# Patient Record
Sex: Female | Born: 1950 | Race: White | Hispanic: Refuse to answer | Marital: Married | Smoking: Never smoker
Health system: Southern US, Community
[De-identification: ages and names within clinical notes are randomized; demographics above are authoritative.]

## PROBLEM LIST (undated history)

## (undated) DIAGNOSIS — Z800 Family history of malignant neoplasm of digestive organs: Secondary | ICD-10-CM

## (undated) DIAGNOSIS — M1990 Unspecified osteoarthritis, unspecified site: Secondary | ICD-10-CM

## (undated) DIAGNOSIS — Z803 Family history of malignant neoplasm of breast: Secondary | ICD-10-CM

## (undated) DIAGNOSIS — IMO0002 Reserved for concepts with insufficient information to code with codable children: Secondary | ICD-10-CM

## (undated) DIAGNOSIS — I10 Essential (primary) hypertension: Secondary | ICD-10-CM

## (undated) DIAGNOSIS — Z801 Family history of malignant neoplasm of trachea, bronchus and lung: Secondary | ICD-10-CM

## (undated) DIAGNOSIS — K219 Gastro-esophageal reflux disease without esophagitis: Secondary | ICD-10-CM

## (undated) DIAGNOSIS — T7840XA Allergy, unspecified, initial encounter: Secondary | ICD-10-CM

## (undated) DIAGNOSIS — K5730 Diverticulosis of large intestine without perforation or abscess without bleeding: Secondary | ICD-10-CM

## (undated) DIAGNOSIS — J309 Allergic rhinitis, unspecified: Secondary | ICD-10-CM

## (undated) DIAGNOSIS — F419 Anxiety disorder, unspecified: Secondary | ICD-10-CM

## (undated) DIAGNOSIS — H269 Unspecified cataract: Secondary | ICD-10-CM

## (undated) DIAGNOSIS — Z8719 Personal history of other diseases of the digestive system: Secondary | ICD-10-CM

## (undated) DIAGNOSIS — J449 Chronic obstructive pulmonary disease, unspecified: Secondary | ICD-10-CM

## (undated) HISTORY — DX: Unspecified cataract: H26.9

## (undated) HISTORY — DX: Anxiety disorder, unspecified: F41.9

## (undated) HISTORY — DX: Family history of malignant neoplasm of breast: Z80.3

## (undated) HISTORY — DX: Gastro-esophageal reflux disease without esophagitis: K21.9

## (undated) HISTORY — DX: Reserved for concepts with insufficient information to code with codable children: IMO0002

## (undated) HISTORY — DX: Allergic rhinitis, unspecified: J30.9

## (undated) HISTORY — DX: Family history of malignant neoplasm of trachea, bronchus and lung: Z80.1

## (undated) HISTORY — DX: Diverticulosis of large intestine without perforation or abscess without bleeding: K57.30

## (undated) HISTORY — DX: Essential (primary) hypertension: I10

## (undated) HISTORY — DX: Allergy, unspecified, initial encounter: T78.40XA

## (undated) HISTORY — DX: Chronic obstructive pulmonary disease, unspecified: J44.9

## (undated) HISTORY — DX: Unspecified osteoarthritis, unspecified site: M19.90

## (undated) HISTORY — DX: Family history of malignant neoplasm of digestive organs: Z80.0

---

## 1982-11-17 HISTORY — PX: TUBAL LIGATION: SHX77

## 1988-11-17 HISTORY — PX: BREAST CYST ASPIRATION: SHX578

## 1989-11-17 HISTORY — PX: CHOLECYSTECTOMY: SHX55

## 1998-02-15 HISTORY — PX: LIPOMA EXCISION: SHX5283

## 2004-12-19 ENCOUNTER — Ambulatory Visit: Payer: Self-pay | Admitting: Family Medicine

## 2005-06-12 DIAGNOSIS — I10 Essential (primary) hypertension: Secondary | ICD-10-CM

## 2005-06-12 HISTORY — DX: Essential (primary) hypertension: I10

## 2006-01-26 ENCOUNTER — Ambulatory Visit: Payer: Self-pay | Admitting: Family Medicine

## 2006-02-04 ENCOUNTER — Ambulatory Visit: Payer: Self-pay | Admitting: Family Medicine

## 2007-02-09 ENCOUNTER — Ambulatory Visit: Payer: Self-pay | Admitting: Family Medicine

## 2008-02-29 ENCOUNTER — Ambulatory Visit: Payer: Self-pay | Admitting: Family Medicine

## 2009-03-13 ENCOUNTER — Ambulatory Visit: Payer: Self-pay | Admitting: Family Medicine

## 2010-03-21 ENCOUNTER — Ambulatory Visit: Payer: Self-pay | Admitting: Internal Medicine

## 2011-04-07 ENCOUNTER — Ambulatory Visit: Payer: Self-pay | Admitting: Internal Medicine

## 2011-04-11 LAB — HM PAP SMEAR: HM Pap smear: NEGATIVE

## 2011-05-18 HISTORY — PX: NASAL SINUS SURGERY: SHX719

## 2011-05-18 HISTORY — PX: CARDIAC CATHETERIZATION: SHX172

## 2011-05-27 ENCOUNTER — Inpatient Hospital Stay: Payer: Self-pay | Admitting: Internal Medicine

## 2011-06-02 ENCOUNTER — Ambulatory Visit: Payer: Self-pay | Admitting: Unknown Physician Specialty

## 2011-06-06 ENCOUNTER — Ambulatory Visit: Payer: Self-pay | Admitting: Unknown Physician Specialty

## 2011-07-19 ENCOUNTER — Emergency Department: Payer: Self-pay | Admitting: Emergency Medicine

## 2011-07-23 ENCOUNTER — Telehealth: Payer: Self-pay | Admitting: Internal Medicine

## 2011-07-23 NOTE — Telephone Encounter (Signed)
Noted I made Jessica aware.

## 2011-07-24 ENCOUNTER — Ambulatory Visit (INDEPENDENT_AMBULATORY_CARE_PROVIDER_SITE_OTHER): Payer: BC Managed Care – PPO | Admitting: Internal Medicine

## 2011-07-24 ENCOUNTER — Encounter: Payer: Self-pay | Admitting: Internal Medicine

## 2011-07-24 VITALS — BP 160/90 | HR 90 | Temp 98.3°F | Ht 66.0 in | Wt 157.2 lb

## 2011-07-24 DIAGNOSIS — R059 Cough, unspecified: Secondary | ICD-10-CM

## 2011-07-24 DIAGNOSIS — R05 Cough: Secondary | ICD-10-CM

## 2011-07-24 MED ORDER — TRAMADOL HCL 50 MG PO TABS: ORAL_TABLET | ORAL | Status: AC

## 2011-07-24 NOTE — Progress Notes (Signed)
  Subjective:    Patient ID: Chloe Moyer, female    DOB: 1951-02-21, 60 y.o.   MRN: [id number]  HPI  60 yowf never smoker though grew up in house full of smoker  with h/o asthma/allergies since 1999 eval by ent /allergist dust grass molds and did well on no maint medications then Jan 2012 with onset of cough eventually had sinus surgery July 2012 > some better breathing but still coughing so referred to pulmonary clinic by Dr Marcello Fennel.   07/24/2011 Initial pulmonary office eval in EMR  Era cc constant clearing of throat. Ok at night s excess mucus supine sleeping or early in am.  Sense of tightness in throat all waking hours assoc with hoarsness.  Nasal congestion much better since surgery July 2012 but cough urge is not.  ROS:  At present neg for  any significant sore throat, dysphagia, itching, sneezing, f fever, chills, sweats, unintended wt loss, pleuritic or exertional cp, hempoptysis, orthopnea pnd or leg swelling.    Also denies any obvious fluctuation of symptoms with weather or environmental changes or other aggravating or alleviating factors.          Review of Systems  Constitutional: Negative for fever, chills and unexpected weight change.  HENT: Positive for congestion, rhinorrhea, voice change and postnasal drip. Negative for ear pain, nosebleeds, sore throat, sneezing, trouble swallowing, dental problem and sinus pressure.   Eyes: Negative for visual disturbance.  Respiratory: Positive for cough. Negative for choking and shortness of breath.   Cardiovascular: Negative for chest pain and leg swelling.  Gastrointestinal: Negative for vomiting, abdominal pain and diarrhea.  Genitourinary: Negative for difficulty urinating.  Musculoskeletal: Negative for arthralgias.  Skin: Positive for rash.  Neurological: Negative for tremors, syncope and headaches.  Hematological: Does not bruise/bleed easily.       Objective:   Physical Exam  amb hoarse wf nad  Wt 157   07/24/11  HEENT: nl dentition, turbinates, and orophanx. Nl external ear canals without cough reflex   NECK :  without JVD/Nodes/TM/ nl carotid upstrokes bilaterally   LUNGS: no acc muscle use, clear to A and P bilaterally without cough on insp or exp maneuvers   CV:  RRR  no s3 or murmur or increase in P2, no edema   ABD:  soft and nontender with nl excursion in the supine position. No bruits or organomegaly, bowel sounds nl  MS:  warm without deformities, calf tenderness, cyanosis or clubbing  SKIN: warm and dry without lesions    NEURO:  alert, approp, no deficits        Assessment & Plan:

## 2011-07-24 NOTE — Patient Instructions (Signed)
Take delsym two tsp every 12 hours and supplement if needed with  tramadol 50 mg up to 2 every 4 hours to suppress the urge to cough. Swallowing water or using ice chips/non mint and menthol containing candies (such as lifesavers or sugarless jolly ranchers) are also effective.  You should rest your voice and avoid activities that you know make you cough.  Once you have eliminated the cough for 3 straight days try reducing the tramadol first,  then the delsym as tolerated.    Try prilosec 20mg   Take 30-60 min before first meal of the day and Zantac 300mg  one daily one bedtime until cough is completely gone for at least a week without the need for cough suppression  I think of reflux for chronic cough like I do oxygen for fire (doesn't cause the fire but once you get the oxygen suppressed it usually goes away regardless of the exact cause).   Stop advair and spiriva and just use the inhaler ventolin as needed  GERD (REFLUX)  is an extremely common cause of respiratory symptoms, many times with no significant heartburn at all.    It can be treated with medication, but also with lifestyle changes including avoidance of late meals, excessive alcohol, smoking cessation, and avoid fatty foods, chocolate, peppermint, colas, red wine, and acidic juices such as orange juice.  NO MINT OR MENTHOL PRODUCTS SO NO COUGH DROPS  USE SUGARLESS CANDY INSTEAD (jolley ranchers or Stover's)  NO OIL BASED VITAMINS - use powdered substitutes.   If you are satisfied with your treatment plan let your doctor know and he/she can either refill your medications or you can return here when your prescription runs out.     If in any way you are not 100% satisfied,  please tell us.  If 100% better, tell your friends!

## 2011-07-25 ENCOUNTER — Encounter: Payer: Self-pay | Admitting: Internal Medicine

## 2011-07-25 NOTE — Assessment & Plan Note (Addendum)
The most common causes of chronic cough in immunocompetent adults include the following: upper airway cough syndrome (UACS), previously referred to as postnasal drip syndrome (PNDS), which is caused by variety of rhinosinus conditions; (2) asthma; (3) GERD; (4) chronic bronchitis from cigarette smoking or other inhaled environmental irritants; (5) nonasthmatic eosinophilic bronchitis; and (6) bronchiectasis.   These conditions, singly or in combination, have accounted for up to 94% of the causes of chronic cough in prospective studies.   Other conditions have constituted no >6% of the causes in prospective studies These have included bronchogenic carcinoma, chronic interstitial pneumonia, sarcoidosis, left ventricular failure, ACEI-induced cough, and aspiration from a condition associated with pharyngeal dysfunction.  This is most likely  Classic Upper airway cough syndrome, so named because it's frequently impossible to sort out how much is  CR/sinusitis with freq throat clearing (which can be related to primary GERD)   vs  causing  secondary (" extra esophageal")  GERD from wide swings in gastric pressure that occur with throat clearing, often  promoting self use of mint and menthol lozenges that reduce the lower esophageal sphincter tone and exacerbate the problem further in a cyclical fashion.   These are the same pts who not infrequently have failed to tolerate ace inhibitors,  dry powder inhalers (she is on two she doesn't think are helping, spiriva and advair) or biphosphonates or report having reflux symptoms that don't respond to standard doses of PPI , and are easily confused as having aecopd or asthma flares,   See instructions for specific recommendations which were reviewed directly with the patient who was given a copy with highlighter outlining the key components.

## 2012-04-12 ENCOUNTER — Ambulatory Visit: Payer: Self-pay | Admitting: Internal Medicine

## 2012-07-02 LAB — CBC AND DIFFERENTIAL
HCT: 39 % (ref 36–46)
WBC: 6.6 10^3/mL

## 2012-07-13 LAB — HM PAP SMEAR

## 2012-09-24 ENCOUNTER — Encounter: Payer: Self-pay | Admitting: *Deleted

## 2012-09-27 ENCOUNTER — Encounter: Payer: Self-pay | Admitting: Internal Medicine

## 2012-09-27 ENCOUNTER — Ambulatory Visit (INDEPENDENT_AMBULATORY_CARE_PROVIDER_SITE_OTHER): Payer: BC Managed Care – PPO | Admitting: Internal Medicine

## 2012-09-27 VITALS — BP 150/90 | HR 91 | Temp 97.6°F | Ht 65.5 in | Wt 175.5 lb

## 2012-09-27 DIAGNOSIS — K219 Gastro-esophageal reflux disease without esophagitis: Secondary | ICD-10-CM

## 2012-09-27 DIAGNOSIS — J309 Allergic rhinitis, unspecified: Secondary | ICD-10-CM

## 2012-09-27 DIAGNOSIS — Z23 Encounter for immunization: Secondary | ICD-10-CM

## 2012-09-27 DIAGNOSIS — J45909 Unspecified asthma, uncomplicated: Secondary | ICD-10-CM

## 2012-09-27 DIAGNOSIS — R030 Elevated blood-pressure reading, without diagnosis of hypertension: Secondary | ICD-10-CM

## 2012-09-27 DIAGNOSIS — Z139 Encounter for screening, unspecified: Secondary | ICD-10-CM

## 2012-09-27 DIAGNOSIS — I10 Essential (primary) hypertension: Secondary | ICD-10-CM

## 2012-09-27 DIAGNOSIS — IMO0001 Reserved for inherently not codable concepts without codable children: Secondary | ICD-10-CM

## 2012-09-27 NOTE — Patient Instructions (Addendum)
It was nice meeting you today.  I am glad you are doing better.  I want you to monitor your blood pressure and let me know if persistent elevation.  We will schedule a follow up appt soon - to reassess.  Let me know if any problems.

## 2012-10-10 ENCOUNTER — Encounter: Payer: Self-pay | Admitting: Internal Medicine

## 2012-10-10 DIAGNOSIS — J309 Allergic rhinitis, unspecified: Secondary | ICD-10-CM | POA: Insufficient documentation

## 2012-10-10 DIAGNOSIS — I10 Essential (primary) hypertension: Secondary | ICD-10-CM | POA: Insufficient documentation

## 2012-10-10 DIAGNOSIS — J45909 Unspecified asthma, uncomplicated: Secondary | ICD-10-CM | POA: Insufficient documentation

## 2012-10-10 DIAGNOSIS — K219 Gastro-esophageal reflux disease without esophagitis: Secondary | ICD-10-CM | POA: Insufficient documentation

## 2012-10-10 NOTE — Assessment & Plan Note (Signed)
Sees Dr Graylon Good and Dr Genelle Bal Senior for her asthmal, allergies and sinus care.  Is s/p sinus surgery x 2.  Currently relatively stable.  Some increased drainage.  Continue her current regimen.

## 2012-10-10 NOTE — Progress Notes (Signed)
Subjective:    Patient ID: Chloe Moyer, female    DOB: 06-10-1951, 61 y.o.   MRN: [id number]  HPI 61 year old female with past history of asthma, sinus problems, allergies and hypertension who comes in today to follow up on these issues as well as to establish care.  She states she is doing better now.  She had started on Lisinopril for her blood pressure.  This caused a persistent cough.  Subsequently had increasing problems with her sinuses and allergies.  Is s/p sinus surgery x 2.  Doing better now.  Some drainage.  On prilosec for acid reflux.  Uses her inhalers.  Breathing stable.  No nausea or vomiting.  Due for colonoscopy this year.  Has some problems with hemorrhoids.   Past Medical History  Diagnosis Date  . Asthma   . GERD (gastroesophageal reflux disease)   . Allergic rhinitis   . Diverticulosis, sigmoid   . Hypertension 06-12-2005  . Arthritis   . Ulcer     Outpatient Encounter Prescriptions as of 09/27/2012  Medication Sig Dispense Refill  . albuterol (PROVENTIL HFA;VENTOLIN HFA) 108 (90 BASE) MCG/ACT inhaler Inhale 2 puffs using inhaler every 4-6 hours as needed for SOB/wheeze.      . budesonide (PULMICORT) 0.5 MG/2ML nebulizer solution Take 0.5 mg by nebulization 2 (two) times daily.      . budesonide-formoterol (SYMBICORT) 160-4.5 MCG/ACT inhaler Inhale 2 puffs into the lungs daily. as directed.      . hydrOXYzine (ATARAX/VISTARIL) 25 MG tablet Take one tablet by mouth once a day as needed.      Marland Kitchen ipratropium (ATROVENT) 0.03 % nasal spray Place 2 sprays into the nose 2 (two) times daily.      . montelukast (SINGULAIR) 10 MG tablet Take one tablet by mouth once a day      . omalizumab (XOLAIR) 150 MG injection Inject subcutaneously twice a week      . omeprazole (PRILOSEC) 20 MG capsule Take 40 mg by mouth daily.       . predniSONE (DELTASONE) 10 MG tablet Take 10 mg by mouth 2 (two) times daily.        Marland Kitchen azelastine (ASTELIN) 137 MCG/SPRAY nasal spray Place 1 spray  into the nose 2 (two) times daily. Use in each nostril as directed      . cyclobenzaprine (FLEXERIL) 10 MG tablet Take 1/2 to 1 tablet by mouth every 8 hours as needed      . diphenhydrAMINE (BENADRYL) 25 mg capsule Take 25 mg by mouth every 6 (six) hours as needed.        . etodolac (LODINE) 400 MG tablet Take 400 mg by mouth 2 (two) times daily. with meals.      . hydrocortisone (ANUSOL-HC) 25 MG suppository Insert one suppository into the rectum every night as needed      . Hypertonic Nasal Wash (SINUS RINSE NA) As directed three times daily       . Multiple Vitamins-Calcium (ONE-A-DAY WOMENS PO) Take 1 tablet by mouth daily.          Review of Systems Patient denies any headache, lightheadedness or dizziness. Some drainage but no significant sinus symptoms.   No chest pain, tightness or palpitations.  No increased shortness of breath, cough or congestion.  Acid reflux controlled.  No nausea or vomiting.  No abdominal pain or cramping.  No bowel change, such as diarrhea, constipation.  Some problems with hemorrhoids.  No urine change.  Objective:   Physical Exam GI.  Due a colonoscopy this year.  Refer to GI for evaluation/colonoscopy.  Annusol HC suppositories for hemorrhoids.    CARDIOVASCULAR.  Currently asymptomatic.  Had a heart catheterizaion recently.  Negative.    HEALTH MAINTENANCE.  Had her physical 8/13.  Due colonoscopy.  Refer to GI.  Mammogram 9/13 - ok.  Obtain recoreds.         Assessment & Plan:  60 year

## 2012-10-10 NOTE — Assessment & Plan Note (Addendum)
Blood pressure elevated.  Have her spot check her pressure.  Get her back in soon to reassess.  Will need medication if persistent elevation.  Check metabolic panel with next labs.  Had cough with lisinopril.  Was on 1/2 of Losartan.  Not taking now.

## 2012-10-10 NOTE — Assessment & Plan Note (Addendum)
Takes prilosec in the morning.  Follow.  Controlled.  H. Pylori negative.  Barium swallow negative.  CXR negative.

## 2012-10-10 NOTE — Assessment & Plan Note (Signed)
Followed by Dr Genelle Bal Senior and Dr Graylon Good for her sinus problems, allergies and asthma.  Breathing stable.  Continue her current med regimen.

## 2012-11-02 ENCOUNTER — Encounter: Payer: Self-pay | Admitting: *Deleted

## 2012-11-03 ENCOUNTER — Other Ambulatory Visit: Payer: Self-pay | Admitting: Internal Medicine

## 2012-11-03 ENCOUNTER — Ambulatory Visit (INDEPENDENT_AMBULATORY_CARE_PROVIDER_SITE_OTHER): Payer: BC Managed Care – PPO | Admitting: Internal Medicine

## 2012-11-03 ENCOUNTER — Encounter: Payer: Self-pay | Admitting: Internal Medicine

## 2012-11-03 VITALS — BP 140/90 | HR 78 | Temp 97.9°F | Ht 65.5 in | Wt 176.5 lb

## 2012-11-03 DIAGNOSIS — I10 Essential (primary) hypertension: Secondary | ICD-10-CM

## 2012-11-03 DIAGNOSIS — J45909 Unspecified asthma, uncomplicated: Secondary | ICD-10-CM

## 2012-11-03 DIAGNOSIS — K219 Gastro-esophageal reflux disease without esophagitis: Secondary | ICD-10-CM

## 2012-11-03 MED ORDER — LOSARTAN POTASSIUM 25 MG PO TABS: 25.0000 mg | ORAL_TABLET | Freq: Every day | ORAL | Status: DC

## 2012-11-03 NOTE — Telephone Encounter (Signed)
Will start pt on 25mg  of Losartan q day.  rx sent in to pharmacy.  #30 with 2 refills.  (she had some left over losartan from previous.  Instructed not to take this - will start with 25mg  dose. )

## 2012-11-03 NOTE — Telephone Encounter (Signed)
Pt wanted to let you know that her Losartin was 100 mg. She was wanting to know if she should cut it in half or how she should take it ???

## 2012-11-03 NOTE — Patient Instructions (Addendum)
It was nice seeing you again today.  I am going to start Losartan - one per day.  Let me know if any problems.  Monitor your blood pressure.

## 2012-11-12 ENCOUNTER — Encounter: Payer: Self-pay | Admitting: Internal Medicine

## 2012-11-12 NOTE — Assessment & Plan Note (Signed)
Blood pressure elevated.  Start Losartan 25mg  q day.  Follow pressures.  Check cr and potassium within the next two weeks.

## 2012-11-12 NOTE — Assessment & Plan Note (Signed)
Breathing stable on current regimen.  Follow.   

## 2012-11-12 NOTE — Assessment & Plan Note (Signed)
Controlled on Prilosec.  Follow.   

## 2012-11-12 NOTE — Progress Notes (Signed)
Subjective:    Patient ID: NEFERTITI MOHAMAD, female    DOB: 12-Oct-1951, 61 y.o.   MRN: [id number]  HPI 61 year old female with past history of asthma, sinus problems, allergies and hypertension who comes in today for a scheduled follow up.  Here to follow up regarding her blood pressure.  Blood pressure has been averaging 140-153/80-90s (on outside checks).  No cardiac symptoms with increased activity or exertion.  On prilosec for acid reflux.  Uses her inhalers.  Breathing stable.  No nausea or vomiting.    Past Medical History  Diagnosis Date  . Asthma   . GERD (gastroesophageal reflux disease)   . Allergic rhinitis   . Diverticulosis, sigmoid   . Hypertension 06-12-2005  . Arthritis   . Ulcer     Outpatient Encounter Prescriptions as of 11/03/2012  Medication Sig Dispense Refill  . albuterol (PROVENTIL HFA;VENTOLIN HFA) 108 (90 BASE) MCG/ACT inhaler Inhale 2 puffs using inhaler every 4-6 hours as needed for SOB/wheeze.      Marland Kitchen azelastine (ASTELIN) 137 MCG/SPRAY nasal spray Place 1 spray into the nose 2 (two) times daily. Use in each nostril as directed      . budesonide (PULMICORT) 0.5 MG/2ML nebulizer solution Take 0.5 mg by nebulization 2 (two) times daily.      . budesonide-formoterol (SYMBICORT) 160-4.5 MCG/ACT inhaler Inhale 2 puffs into the lungs daily. as directed.      . cyclobenzaprine (FLEXERIL) 10 MG tablet Take 1/2 to 1 tablet by mouth every 8 hours as needed      . diphenhydrAMINE (BENADRYL) 25 mg capsule Take 25 mg by mouth every 6 (six) hours as needed.        . etodolac (LODINE) 400 MG tablet Take 400 mg by mouth 2 (two) times daily. with meals.      . hydrocortisone (ANUSOL-HC) 25 MG suppository Insert one suppository into the rectum every night as needed      . hydrOXYzine (ATARAX/VISTARIL) 25 MG tablet Take one tablet by mouth once a day as needed.      . Hypertonic Nasal Wash (SINUS RINSE NA) As directed three times daily       . ipratropium (ATROVENT) 0.03 % nasal  spray Place 2 sprays into the nose 2 (two) times daily.      . montelukast (SINGULAIR) 10 MG tablet Take one tablet by mouth once a day      . Multiple Vitamins-Calcium (ONE-A-DAY WOMENS PO) Take 1 tablet by mouth daily.        Marland Kitchen omalizumab (XOLAIR) 150 MG injection Inject subcutaneously twice a week      . omeprazole (PRILOSEC) 20 MG capsule Take 40 mg by mouth daily.       . predniSONE (DELTASONE) 10 MG tablet Take 10 mg by mouth 2 (two) times daily.          Review of Systems Patient denies any headache, lightheadedness or dizziness.  No chest pain, tightness or palpitations.  No increased shortness of breath, cough or congestion.  Acid reflux controlled.  No nausea or vomiting.  No abdominal pain or cramping.  No bowel change, such as diarrhea, constipation.   No urine change.        Objective:   Physical Exam  Filed Vitals:   11/03/12 0943  BP: 140/90  Pulse: 78  Temp: 97.9 F (77.38 C)   61 year old female in no acute distress.   HEENT:  Nares - clear.  OP- without lesions or  erythema.  NECK:  Supple, nontender.  No audible bruit.   HEART:  Appears to be regular. LUNGS:  Without crackles or wheezing audible.  Respirations even and unlabored.   RADIAL PULSE:  Equal bilaterally.  ABDOMEN:  Soft, nontender.  No audible abdominal bruit.   EXTREMITIES:  No increased edema to be present.                  Assessment & Plan:  GI.  Due a colonoscopy this year.  Referred to GI for evaluation/colonoscopy.  Was given Annusol HC suppositories for hemorrhoids.  CARDIOVASCULAR.  Currently asymptomatic.  Had a heart catheterization recently negative.    HEALTH MAINTENANCE.  Had her physical 8/13.  Due colonoscopy.  Refer to GI.  Mammogram 9/13 - ok.  Obtain recoreds.

## 2012-11-19 ENCOUNTER — Other Ambulatory Visit (INDEPENDENT_AMBULATORY_CARE_PROVIDER_SITE_OTHER): Payer: BC Managed Care – PPO

## 2012-11-19 DIAGNOSIS — I10 Essential (primary) hypertension: Secondary | ICD-10-CM

## 2012-11-19 LAB — CREATININE, SERUM: Creatinine, Ser: 0.7 mg/dL (ref 0.4–1.2)

## 2012-11-19 LAB — POTASSIUM: Potassium: 3.7 mEq/L (ref 3.5–5.1)

## 2012-12-21 ENCOUNTER — Ambulatory Visit: Payer: BC Managed Care – PPO | Admitting: Internal Medicine

## 2012-12-29 ENCOUNTER — Encounter: Payer: Self-pay | Admitting: Internal Medicine

## 2012-12-29 ENCOUNTER — Ambulatory Visit (INDEPENDENT_AMBULATORY_CARE_PROVIDER_SITE_OTHER): Payer: BC Managed Care – PPO | Admitting: Internal Medicine

## 2012-12-29 ENCOUNTER — Telehealth: Payer: Self-pay | Admitting: Internal Medicine

## 2012-12-29 VITALS — BP 126/88 | HR 85 | Temp 97.7°F | Wt 178.0 lb

## 2012-12-29 DIAGNOSIS — Z1239 Encounter for other screening for malignant neoplasm of breast: Secondary | ICD-10-CM

## 2012-12-29 DIAGNOSIS — J45909 Unspecified asthma, uncomplicated: Secondary | ICD-10-CM

## 2012-12-29 DIAGNOSIS — I10 Essential (primary) hypertension: Secondary | ICD-10-CM

## 2012-12-29 DIAGNOSIS — J309 Allergic rhinitis, unspecified: Secondary | ICD-10-CM

## 2012-12-29 DIAGNOSIS — K219 Gastro-esophageal reflux disease without esophagitis: Secondary | ICD-10-CM

## 2012-12-29 NOTE — Telephone Encounter (Signed)
°  Refill on  losartan (COZAAR) 25 MG tablet   Send to [facility]

## 2012-12-30 ENCOUNTER — Encounter: Payer: Self-pay | Admitting: Internal Medicine

## 2012-12-30 NOTE — Assessment & Plan Note (Signed)
Breathing stable on symbicort.  Follow.

## 2012-12-30 NOTE — Assessment & Plan Note (Signed)
On losartan 25mg q day.  Blood pressure doing well.  Recent Cr and potassium wnl.  Follow.   

## 2012-12-30 NOTE — Progress Notes (Signed)
Subjective:    Patient ID: Chloe Moyer, female    DOB: [birth year], 62 y.o.   MRN: [id number]  HPI 62 year old female with past history of asthma, sinus problems, allergies and hypertension who comes in today for a scheduled follow up.  Here to follow up regarding her blood pressure.  Blood pressure has been averaging 110-130/60-70s. (on outside checks).  No cardiac symptoms with increased activity or exertion.  On prilosec for acid reflux.  Uses her inhalers.  Breathing stable.  No nausea or vomiting.    Past Medical History  Diagnosis Date  . Asthma   . GERD (gastroesophageal reflux disease)   . Allergic rhinitis   . Diverticulosis, sigmoid   . Hypertension 06-12-2005  . Arthritis   . Ulcer     Outpatient Encounter Prescriptions as of 12/29/2012  Medication Sig Dispense Refill  . albuterol (PROVENTIL HFA;VENTOLIN HFA) 108 (90 BASE) MCG/ACT inhaler Inhale 2 puffs using inhaler every 4-6 hours as needed for SOB/wheeze.      Marland Kitchen azelastine (ASTELIN) 137 MCG/SPRAY nasal spray Place 1 spray into the nose 2 (two) times daily. Use in each nostril as directed      . budesonide (PULMICORT) 0.5 MG/2ML nebulizer solution Take 0.5 mg by nebulization 2 (two) times daily.      . budesonide-formoterol (SYMBICORT) 160-4.5 MCG/ACT inhaler Inhale 2 puffs into the lungs daily. as directed.      . diphenhydrAMINE (BENADRYL) 25 mg capsule Take 25 mg by mouth every 6 (six) hours as needed.        . hydrocortisone (ANUSOL-HC) 25 MG suppository Insert one suppository into the rectum every night as needed      . Hypertonic Nasal Wash (SINUS RINSE NA) As directed three times daily       . losartan (COZAAR) 25 MG tablet Take 1 tablet (25 mg total) by mouth daily.  30 tablet  2  . omalizumab (XOLAIR) 150 MG injection Inject subcutaneously twice a week      . omeprazole (PRILOSEC) 20 MG capsule Take 40 mg by mouth daily.       . predniSONE (DELTASONE) 10 MG tablet Take 10 mg by mouth 2 (two) times daily.         . cyclobenzaprine (FLEXERIL) 10 MG tablet Take 1/2 to 1 tablet by mouth every 8 hours as needed      . etodolac (LODINE) 400 MG tablet Take 400 mg by mouth 2 (two) times daily. with meals.      . hydrOXYzine (ATARAX/VISTARIL) 25 MG tablet Take one tablet by mouth once a day as needed.      Marland Kitchen ipratropium (ATROVENT) 0.03 % nasal spray Place 2 sprays into the nose 2 (two) times daily.      . montelukast (SINGULAIR) 10 MG tablet Take one tablet by mouth once a day      . Multiple Vitamins-Calcium (ONE-A-DAY WOMENS PO) Take 1 tablet by mouth daily.         No facility-administered encounter medications on file as of 12/29/2012.    Review of Systems Patient denies any headache, lightheadedness or dizziness.  No chest pain, tightness or palpitations.  No increased shortness of breath, cough or congestion.  Acid reflux controlled.  No nausea or vomiting.  No abdominal pain or cramping.  No bowel change, such as diarrhea, constipation.   No urine change.  Has follow up for her sinuses - 01/06/13.  Will see Dr Gillermina Hu.  States she is using her saline  rinse for her nose.  Some right ear fullness previously.  She states feels better today.        Objective:   Physical Exam  Filed Vitals:   12/29/12 0957  BP: 126/88  Pulse: 85  Temp: 97.7 F (36.5 C)   Blood pressure recheck:  40/36  62 year old female in no acute distress.   HEENT:  Nares - clear.  OP- without lesions or erythema.  TMs without erythema.   NECK:  Supple, nontender.  No audible bruit.   HEART:  Appears to be regular. LUNGS:  Without crackles or wheezing audible.  Respirations even and unlabored.   RADIAL PULSE:  Equal bilaterally.  ABDOMEN:  Soft, nontender.  No audible abdominal bruit.   EXTREMITIES:  No increased edema to be present.                  Assessment & Plan:  GI.  Due a colonoscopy this year.  Was referred to GI for evaluation/colonoscopy.   CARDIOVASCULAR.  Currently asymptomatic.  Had a heart catheterization  recently negative.    HEALTH MAINTENANCE.  Had her physical 8/13.  Due colonoscopy.  Refer to GI.  Mammogram 04/12/12 - Birads II.  Pap 04/11/11 - negative.

## 2012-12-30 NOTE — Assessment & Plan Note (Signed)
Controlled.  Follow.   

## 2012-12-30 NOTE — Assessment & Plan Note (Signed)
Stable on current regimen   

## 2013-01-01 ENCOUNTER — Other Ambulatory Visit: Payer: Self-pay

## 2013-01-03 MED ORDER — LOSARTAN POTASSIUM 25 MG PO TABS: 25.0000 mg | ORAL_TABLET | Freq: Every day | ORAL | Status: DC

## 2013-01-03 NOTE — Telephone Encounter (Signed)
Sent in to pharmacy.  

## 2013-04-13 ENCOUNTER — Ambulatory Visit: Payer: Self-pay | Admitting: Internal Medicine

## 2013-04-28 ENCOUNTER — Encounter: Payer: Self-pay | Admitting: Internal Medicine

## 2013-04-28 ENCOUNTER — Ambulatory Visit (INDEPENDENT_AMBULATORY_CARE_PROVIDER_SITE_OTHER): Payer: BC Managed Care – PPO | Admitting: Internal Medicine

## 2013-04-28 VITALS — BP 120/80 | HR 78 | Temp 98.3°F | Ht 65.5 in | Wt 174.8 lb

## 2013-04-28 DIAGNOSIS — K219 Gastro-esophageal reflux disease without esophagitis: Secondary | ICD-10-CM

## 2013-04-28 DIAGNOSIS — J45909 Unspecified asthma, uncomplicated: Secondary | ICD-10-CM

## 2013-04-28 DIAGNOSIS — J309 Allergic rhinitis, unspecified: Secondary | ICD-10-CM

## 2013-04-28 DIAGNOSIS — Z13220 Encounter for screening for lipoid disorders: Secondary | ICD-10-CM

## 2013-04-28 DIAGNOSIS — Z23 Encounter for immunization: Secondary | ICD-10-CM

## 2013-04-28 DIAGNOSIS — I10 Essential (primary) hypertension: Secondary | ICD-10-CM

## 2013-04-28 NOTE — Progress Notes (Signed)
Subjective:    Patient ID: Chloe Moyer, female    DOB: Dec 17, 1950, 62 y.o.   MRN: [id number]  HPI 62 year old female with past history of asthma, sinus problems, allergies and hypertension who comes in today for a scheduled follow up. No cardiac symptoms with increased activity or exertion.  On prilosec for acid reflux.  Uses her inhalers.  Breathing stable. Some increased drainage.  No nausea or vomiting.  Just had a dental procedure.  Doing well.  Also had Botox injections yesterday.  States her blood pressure has been doing well.    Past Medical History  Diagnosis Date  . Asthma   . GERD (gastroesophageal reflux disease)   . Allergic rhinitis   . Diverticulosis, sigmoid   . Hypertension 06-12-2005  . Arthritis   . Ulcer     Outpatient Encounter Prescriptions as of 04/28/2013  Medication Sig Dispense Refill  . albuterol (PROVENTIL HFA;VENTOLIN HFA) 108 (90 BASE) MCG/ACT inhaler Inhale 2 puffs using inhaler every 4-6 hours as needed for SOB/wheeze.      Marland Kitchen azelastine (ASTELIN) 137 MCG/SPRAY nasal spray Place 1 spray into the nose 2 (two) times daily. Use in each nostril as directed      . budesonide (PULMICORT) 0.5 MG/2ML nebulizer solution Take 0.5 mg by nebulization 2 (two) times daily.      . budesonide-formoterol (SYMBICORT) 160-4.5 MCG/ACT inhaler Inhale 2 puffs into the lungs daily. as directed.      . diphenhydrAMINE (BENADRYL) 25 mg capsule Take 25 mg by mouth every 6 (six) hours as needed.        . hydrocortisone (ANUSOL-HC) 25 MG suppository Insert one suppository into the rectum every night as needed      . Hypertonic Nasal Wash (SINUS RINSE NA) As directed three times daily       . losartan (COZAAR) 25 MG tablet Take 1 tablet (25 mg total) by mouth daily.  90 tablet  1  . omalizumab (XOLAIR) 150 MG injection Inject subcutaneously twice a week      . omeprazole (PRILOSEC) 20 MG capsule Take 40 mg by mouth daily.       . cyclobenzaprine (FLEXERIL) 10 MG tablet Take 1/2 to  1 tablet by mouth every 8 hours as needed      . etodolac (LODINE) 400 MG tablet Take 400 mg by mouth 2 (two) times daily. with meals.      . hydrOXYzine (ATARAX/VISTARIL) 25 MG tablet Take one tablet by mouth once a day as needed.      Marland Kitchen ipratropium (ATROVENT) 0.03 % nasal spray Place 2 sprays into the nose 2 (two) times daily.      . montelukast (SINGULAIR) 10 MG tablet Take one tablet by mouth once a day      . Multiple Vitamins-Calcium (ONE-A-DAY WOMENS PO) Take 1 tablet by mouth daily.        . predniSONE (DELTASONE) 10 MG tablet Take 10 mg by mouth 2 (two) times daily.         No facility-administered encounter medications on file as of 04/28/2013.    Review of Systems Patient denies any headache, lightheadedness or dizziness.  Some drainage.  No chest pain, tightness or palpitations.  No increased shortness of breath, cough or congestion.  Acid reflux. On prilosec.  No nausea or vomiting.  No abdominal pain or cramping.  No bowel change, such as diarrhea, constipation.   No urine change.  States she is using her saline rinse for her  nose.        Objective:   Physical Exam  Filed Vitals:   04/28/13 0857  BP: 120/80  Pulse: 78  Temp: 98.3 F (36.8 C)   Blood pressure recheck:  128/82, pulse 91  62 year old female in no acute distress.   HEENT:  Nares - clear.  OP- without lesions or erythema.   NECK:  Supple, nontender.  No audible bruit.   HEART:  Appears to be regular. LUNGS:  Without crackles or wheezing audible.  Respirations even and unlabored.   RADIAL PULSE:  Equal bilaterally.  ABDOMEN:  Soft, nontender.  No audible abdominal bruit.   EXTREMITIES:  No increased edema to be present.                  Assessment & Plan:  GI.  Due a colonoscopy this year.  States she is due 12/14.    CARDIOVASCULAR.  Currently asymptomatic.  Had a heart catheterization recently -  negative.    HEALTH MAINTENANCE.  Had her physical 8/13.  Schedule her for her physical in 8/14. Due  colonoscopy 12/14.  Mammogram 04/12/12 - Birads II.  Had a f/u mammogram end of 5/14.  Obtain results.  Pap 04/11/11 - negative.   Tdap given today.

## 2013-05-08 ENCOUNTER — Encounter: Payer: Self-pay | Admitting: Internal Medicine

## 2013-05-08 NOTE — Assessment & Plan Note (Signed)
Controlled.  Follow.   

## 2013-05-08 NOTE — Assessment & Plan Note (Signed)
On losartan 25mg q day.  Blood pressure doing well.  Recent Cr and potassium wnl.  Follow.   

## 2013-05-08 NOTE — Assessment & Plan Note (Signed)
Stable on current regimen   

## 2013-05-08 NOTE — Assessment & Plan Note (Signed)
Breathing stable on symbicort.  Follow.

## 2013-06-01 ENCOUNTER — Encounter: Payer: Self-pay | Admitting: Internal Medicine

## 2013-06-22 ENCOUNTER — Telehealth: Payer: Self-pay | Admitting: Internal Medicine

## 2013-06-22 NOTE — Telephone Encounter (Signed)
error 

## 2013-07-01 ENCOUNTER — Other Ambulatory Visit (INDEPENDENT_AMBULATORY_CARE_PROVIDER_SITE_OTHER): Payer: BC Managed Care – PPO

## 2013-07-01 DIAGNOSIS — Z13220 Encounter for screening for lipoid disorders: Secondary | ICD-10-CM

## 2013-07-01 DIAGNOSIS — J309 Allergic rhinitis, unspecified: Secondary | ICD-10-CM

## 2013-07-01 DIAGNOSIS — I10 Essential (primary) hypertension: Secondary | ICD-10-CM

## 2013-07-01 LAB — COMPREHENSIVE METABOLIC PANEL
ALT: 25 U/L (ref 0–35)
Albumin: 4 g/dL (ref 3.5–5.2)
Alkaline Phosphatase: 55 U/L (ref 39–117)
CO2: 27 mEq/L (ref 19–32)
GFR: 86.75 mL/min (ref >60.00)
Glucose, Bld: 90 mg/dL (ref 70–99)
Potassium: 3.9 mEq/L (ref 3.5–5.1)
Sodium: 137 mEq/L (ref 135–145)
Total Protein: 7.4 g/dL (ref 6.0–8.3)

## 2013-07-01 LAB — CBC WITH DIFFERENTIAL/PLATELET
Basophils Absolute: 0 10*3/uL (ref 0.0–0.1)
Eosinophils Relative: 4.2 % (ref 0.0–5.0)
Monocytes Relative: 9.4 % (ref 3.0–12.0)
Neutrophils Relative %: 40.1 % — ABNORMAL LOW (ref 43.0–77.0)
Platelets: 179 10*3/uL (ref 150.0–400.0)
WBC: 4.1 10*3/uL — ABNORMAL LOW (ref 4.5–10.5)

## 2013-07-01 LAB — LIPID PANEL
Total CHOL/HDL Ratio: 4
VLDL: 15.2 mg/dL (ref 0.0–40.0)

## 2013-07-01 LAB — TSH: TSH: 3.37 u[IU]/mL (ref 0.35–5.50)

## 2013-07-02 ENCOUNTER — Encounter: Payer: Self-pay | Admitting: Internal Medicine

## 2013-07-05 ENCOUNTER — Encounter: Payer: Self-pay | Admitting: Internal Medicine

## 2013-07-05 ENCOUNTER — Ambulatory Visit (INDEPENDENT_AMBULATORY_CARE_PROVIDER_SITE_OTHER): Payer: BC Managed Care – PPO | Admitting: Internal Medicine

## 2013-07-05 VITALS — BP 120/80 | HR 80 | Temp 98.1°F | Ht 65.0 in | Wt 179.5 lb

## 2013-07-05 DIAGNOSIS — D72819 Decreased white blood cell count, unspecified: Secondary | ICD-10-CM

## 2013-07-05 DIAGNOSIS — K219 Gastro-esophageal reflux disease without esophagitis: Secondary | ICD-10-CM

## 2013-07-05 DIAGNOSIS — J45909 Unspecified asthma, uncomplicated: Secondary | ICD-10-CM

## 2013-07-05 DIAGNOSIS — J309 Allergic rhinitis, unspecified: Secondary | ICD-10-CM

## 2013-07-05 DIAGNOSIS — I10 Essential (primary) hypertension: Secondary | ICD-10-CM

## 2013-07-05 MED ORDER — LOSARTAN POTASSIUM 25 MG PO TABS: 25.0000 mg | ORAL_TABLET | Freq: Every day | ORAL | Status: DC

## 2013-07-05 NOTE — Progress Notes (Signed)
Subjective:    Patient ID: Chloe Moyer, female    DOB: 03-09-1951, 62 y.o.   MRN: [id number]  HPI 62 year old female with past history of asthma, sinus problems, allergies and hypertension who comes in today to follow up on these issues as well as for a complete physical exam.  No cardiac symptoms with increased activity or exertion.  On prilosec for acid reflux.  Uses her inhalers.  Breathing stable.  No significant sinus or allergy symptoms.  Due to follow up with Dr Senior this week.  No nausea or vomiting.  Doing well.  States her blood pressure has been doing well.  Has a follow up breathing test 11/14 (at [facility]).     Past Medical History  Diagnosis Date  . Asthma   . GERD (gastroesophageal reflux disease)   . Allergic rhinitis   . Diverticulosis, sigmoid   . Hypertension 06-12-2005  . Arthritis   . Ulcer     Outpatient Encounter Prescriptions as of 07/05/2013  Medication Sig Dispense Refill  . albuterol (PROVENTIL HFA;VENTOLIN HFA) 108 (90 BASE) MCG/ACT inhaler Inhale 2 puffs using inhaler every 4-6 hours as needed for SOB/wheeze.      Marland Kitchen azelastine (ASTELIN) 137 MCG/SPRAY nasal spray Place 1 spray into the nose 2 (two) times daily. Use in each nostril as directed      . budesonide (PULMICORT) 0.5 MG/2ML nebulizer solution Take 0.5 mg by nebulization 2 (two) times daily.      . budesonide-formoterol (SYMBICORT) 160-4.5 MCG/ACT inhaler Inhale 2 puffs into the lungs daily. as directed.      . diphenhydrAMINE (BENADRYL) 25 mg capsule Take 25 mg by mouth every 6 (six) hours as needed.        . hydrocortisone (ANUSOL-HC) 25 MG suppository Insert one suppository into the rectum every night as needed      . hydrOXYzine (ATARAX/VISTARIL) 25 MG tablet Take one tablet by mouth once a day as needed.      . Hypertonic Nasal Wash (SINUS RINSE NA) As directed three times daily       . losartan (COZAAR) 25 MG tablet Take 1 tablet (25 mg total) by mouth daily.  90 tablet  1  . omalizumab  (XOLAIR) 150 MG injection Inject subcutaneously twice a week      . omeprazole (PRILOSEC) 20 MG capsule Take 40 mg by mouth daily.       . [DISCONTINUED] montelukast (SINGULAIR) 10 MG tablet Take one tablet by mouth once a day       No facility-administered encounter medications on file as of 07/05/2013.    Review of Systems Patient denies any headache, lightheadedness or dizziness.  No significant sinus or allergy symptoms.  No chest pain, tightness or palpitations.  No increased shortness of breath, cough or congestion.  Acid reflux. On prilosec.  Controls.  No nausea or vomiting.  No abdominal pain or cramping.  No bowel change, such as diarrhea, constipation.   No urine change.  States she is using her saline rinse for her nose.        Objective:   Physical Exam  Filed Vitals:   07/05/13 0930  BP: 120/80  Pulse: 80  Temp: 98.1 F (36.7 C)   Pulse 33  62 year old female in no acute distress.   HEENT:  Nares- clear.  Oropharynx - without lesions. NECK:  Supple.  Nontender.  No audible bruit.  HEART:  Appears to be regular. LUNGS:  No crackles or wheezing  audible.  Respirations even and unlabored.  RADIAL PULSE:  Equal bilaterally.    BREASTS:  No nipple discharge or nipple retraction present.  Could not appreciate any distinct nodules or axillary adenopathy.  ABDOMEN:  Soft, nontender.  Bowel sounds present and normal.  No audible abdominal bruit.  GU:  Not performed.    EXTREMITIES:  No increased edema present.  DP pulses palpable and equal bilaterally.                 Assessment & Plan:  GI.  Due a colonoscopy this year.  States she is due 12/14.  (Dr Markham Jordan).    CARDIOVASCULAR.  Currently asymptomatic.  Had a heart catheterization recently -  negative.    HEALTH MAINTENANCE.  Physical today.   Due colonoscopy 12/14.  Mammogram 04/13/13 - Birads II. Pap 04/11/11 - negative.   States she also had a pap in 2013.  Obtain results.

## 2013-07-07 ENCOUNTER — Encounter: Payer: Self-pay | Admitting: Internal Medicine

## 2013-07-07 DIAGNOSIS — D72819 Decreased white blood cell count, unspecified: Secondary | ICD-10-CM | POA: Insufficient documentation

## 2013-07-07 NOTE — Assessment & Plan Note (Signed)
Breathing stable on symbicort.  Follow.  Scheduled for a follow up breathing test in 11/14.

## 2013-07-07 NOTE — Assessment & Plan Note (Signed)
Stable on current regimen   

## 2013-07-07 NOTE — Assessment & Plan Note (Signed)
White blood cell count just slightly decreased.  Will recheck cbc to confirm stable/normal.

## 2013-07-07 NOTE — Assessment & Plan Note (Signed)
On losartan 25mg q day.  Blood pressure doing well.  Recent Cr and potassium wnl.  Follow.   

## 2013-07-07 NOTE — Assessment & Plan Note (Signed)
Controlled.  Follow.   

## 2013-08-05 ENCOUNTER — Other Ambulatory Visit (INDEPENDENT_AMBULATORY_CARE_PROVIDER_SITE_OTHER): Payer: BC Managed Care – PPO

## 2013-08-05 ENCOUNTER — Encounter: Payer: Self-pay | Admitting: Internal Medicine

## 2013-08-05 DIAGNOSIS — D72819 Decreased white blood cell count, unspecified: Secondary | ICD-10-CM

## 2013-08-05 LAB — CBC WITH DIFFERENTIAL/PLATELET
Basophils Absolute: 0 10*3/uL (ref 0.0–0.1)
Eosinophils Absolute: 0.2 10*3/uL (ref 0.0–0.7)
HCT: 36.4 % (ref 36.0–46.0)
Lymphs Abs: 2.9 10*3/uL (ref 0.7–4.0)
MCHC: 33.6 g/dL (ref 30.0–36.0)
Monocytes Absolute: 0.5 10*3/uL (ref 0.1–1.0)
Monocytes Relative: 7.4 % (ref 3.0–12.0)
Neutro Abs: 3.2 10*3/uL (ref 1.4–7.7)
Platelets: 192 10*3/uL (ref 150.0–400.0)
RDW: 14.5 % (ref 11.5–14.6)

## 2013-08-07 ENCOUNTER — Encounter: Payer: Self-pay | Admitting: Internal Medicine

## 2013-08-08 NOTE — Telephone Encounter (Signed)
Mailed unread message to pt  

## 2013-09-22 ENCOUNTER — Other Ambulatory Visit: Payer: Self-pay

## 2014-01-05 ENCOUNTER — Ambulatory Visit: Payer: BC Managed Care – PPO | Admitting: Internal Medicine

## 2014-01-27 ENCOUNTER — Encounter: Payer: Self-pay | Admitting: Internal Medicine

## 2014-01-27 ENCOUNTER — Ambulatory Visit (INDEPENDENT_AMBULATORY_CARE_PROVIDER_SITE_OTHER): Payer: BC Managed Care – PPO | Admitting: Internal Medicine

## 2014-01-27 VITALS — BP 140/80 | HR 75 | Temp 98.1°F | Ht 65.0 in | Wt 178.5 lb

## 2014-01-27 DIAGNOSIS — Z1239 Encounter for other screening for malignant neoplasm of breast: Secondary | ICD-10-CM

## 2014-01-27 DIAGNOSIS — J309 Allergic rhinitis, unspecified: Secondary | ICD-10-CM

## 2014-01-27 DIAGNOSIS — K219 Gastro-esophageal reflux disease without esophagitis: Secondary | ICD-10-CM

## 2014-01-27 DIAGNOSIS — I10 Essential (primary) hypertension: Secondary | ICD-10-CM

## 2014-01-27 DIAGNOSIS — D72819 Decreased white blood cell count, unspecified: Secondary | ICD-10-CM

## 2014-01-27 DIAGNOSIS — J45909 Unspecified asthma, uncomplicated: Secondary | ICD-10-CM

## 2014-01-27 LAB — CBC WITH DIFFERENTIAL/PLATELET
BASOS ABS: 0.1 10*3/uL (ref 0.0–0.1)
Basophils Relative: 1 % (ref 0–1)
EOS PCT: 5 % (ref 0–5)
Eosinophils Absolute: 0.3 10*3/uL (ref 0.0–0.7)
HCT: 38.5 % (ref 36.0–46.0)
Hemoglobin: 12.8 g/dL (ref 12.0–15.0)
LYMPHS PCT: 32 % (ref 12–46)
Lymphs Abs: 1.9 10*3/uL (ref 0.7–4.0)
MCH: 27.3 pg (ref 26.0–34.0)
MCHC: 33.2 g/dL (ref 30.0–36.0)
MCV: 82.1 fL (ref 78.0–100.0)
Monocytes Absolute: 0.7 10*3/uL (ref 0.1–1.0)
Monocytes Relative: 12 % (ref 3–12)
NEUTROS ABS: 3 10*3/uL (ref 1.7–7.7)
Neutrophils Relative %: 50 % (ref 43–77)
PLATELETS: 195 10*3/uL (ref 150–400)
RBC: 4.69 MIL/uL (ref 3.87–5.11)
RDW: 15 % (ref 11.5–15.5)
WBC: 5.9 10*3/uL (ref 4.0–10.5)

## 2014-01-27 NOTE — Progress Notes (Signed)
Subjective:    Patient ID: Chloe Moyer, female    DOB: [birth year], 63 y.o.   MRN: [id number]  HPI 63 year old female with past history of asthma, sinus problems, allergies and hypertension who comes in today for a scheduled follow up.   No cardiac symptoms with increased activity or exertion.  On prilosec for acid reflux.  Uses her inhalers.  Breathing stable.  No significant sinus or allergy symptoms.  No nausea or vomiting.  Doing well.  Her blood pressure has been doing well.  Being followed at [facility].  Overall feels good and feels she is doing well.     Past Medical History  Diagnosis Date  . Asthma   . GERD (gastroesophageal reflux disease)   . Allergic rhinitis   . Diverticulosis, sigmoid   . Hypertension 06-12-2005  . Arthritis   . Ulcer     Outpatient Encounter Prescriptions as of 01/27/2014  Medication Sig  . albuterol (PROVENTIL HFA;VENTOLIN HFA) 108 (90 BASE) MCG/ACT inhaler Inhale 2 puffs using inhaler every 4-6 hours as needed for SOB/wheeze.  Marland Kitchen azelastine (ASTELIN) 137 MCG/SPRAY nasal spray Place 1 spray into the nose 2 (two) times daily. Use in each nostril as directed  . budesonide (PULMICORT) 0.5 MG/2ML nebulizer solution Take 0.5 mg by nebulization 2 (two) times daily.  . budesonide-formoterol (SYMBICORT) 160-4.5 MCG/ACT inhaler Inhale 2 puffs into the lungs daily. as directed.  . diphenhydrAMINE (BENADRYL) 25 mg capsule Take 25 mg by mouth every 6 (six) hours as needed.    . hydrocortisone (ANUSOL-HC) 25 MG suppository Insert one suppository into the rectum every night as needed  . hydrOXYzine (ATARAX/VISTARIL) 25 MG tablet Take one tablet by mouth once a day as needed.  . Hypertonic Nasal Wash (SINUS RINSE NA) As directed three times daily   . losartan (COZAAR) 25 MG tablet Take 1 tablet (25 mg total) by mouth daily.  Marland Kitchen omalizumab (XOLAIR) 150 MG injection Inject subcutaneously twice a week  . omeprazole (PRILOSEC) 20 MG capsule Take 40 mg by mouth daily.      Review of Systems Patient denies any headache, lightheadedness or dizziness.  No significant sinus or allergy symptoms.  No chest pain, tightness or palpitations.  No increased shortness of breath, cough or congestion.  Acid reflux.  On prilosec.  Controls.  No nausea or vomiting.  No abdominal pain or cramping.  No bowel change, such as diarrhea, constipation.   No urine change.         Objective:   Physical Exam  Filed Vitals:   01/27/14 1510  BP: 140/80  Pulse: 75  Temp: 98.1 F (61.33 C)   63 year old female in no acute distress.   HEENT:  Nares- clear.  Oropharynx - without lesions. NECK:  Supple.  Nontender.  No audible bruit.  HEART:  Appears to be regular. LUNGS:  No crackles or wheezing audible.  Respirations even and unlabored.  RADIAL PULSE:  Equal bilaterally.  ABDOMEN:  Soft, nontender.  Bowel sounds present and normal.  No audible abdominal bruit.     EXTREMITIES:  No increased edema present.  DP pulses palpable and equal bilaterally.                 Assessment & Plan:  GI.  Due a colonoscopy this year.  Is scheduled 04/07/14.  (Dr Tiffany Kocher).    CARDIOVASCULAR.  Currently asymptomatic.  Had a heart catheterization recently -  negative.    HEALTH MAINTENANCE.  Physical last visit.  Colonoscopy scheduled for 04/07/14.   Mammogram 04/13/13 - Birads II. Pap 04/11/11 - negative.   States she also had a pap in 2013.

## 2014-01-27 NOTE — Progress Notes (Signed)
Pre-visit discussion using our clinic review tool. No additional management support is needed unless otherwise documented below in the visit note.  

## 2014-01-28 ENCOUNTER — Encounter: Payer: Self-pay | Admitting: Internal Medicine

## 2014-01-28 LAB — COMPREHENSIVE METABOLIC PANEL
ALBUMIN: 4.2 g/dL (ref 3.5–5.2)
ALT: 28 U/L (ref 0–35)
AST: 28 U/L (ref 0–37)
Alkaline Phosphatase: 49 U/L (ref 39–117)
BUN: 11 mg/dL (ref 6–23)
CALCIUM: 9.4 mg/dL (ref 8.4–10.5)
CO2: 26 meq/L (ref 19–32)
Chloride: 104 mEq/L (ref 96–112)
Creat: 0.84 mg/dL (ref 0.50–1.10)
GLUCOSE: 95 mg/dL (ref 70–99)
POTASSIUM: 4.4 meq/L (ref 3.5–5.3)
SODIUM: 140 meq/L (ref 135–145)
TOTAL PROTEIN: 6.9 g/dL (ref 6.0–8.3)
Total Bilirubin: 0.6 mg/dL (ref 0.2–1.2)

## 2014-01-29 ENCOUNTER — Encounter: Payer: Self-pay | Admitting: Internal Medicine

## 2014-01-29 MED ORDER — HYDROCORTISONE ACETATE 25 MG RE SUPP: 25.0000 mg | Freq: Two times a day (BID) | RECTAL | Status: DC | PRN

## 2014-01-29 MED ORDER — HYDROXYZINE HCL 25 MG PO TABS: 25.0000 mg | ORAL_TABLET | Freq: Every day | ORAL | Status: DC

## 2014-01-29 NOTE — Assessment & Plan Note (Signed)
Controlled.  Follow.   

## 2014-01-29 NOTE — Assessment & Plan Note (Signed)
On losartan 25mg q day.  Blood pressure doing well.  Recent Cr and potassium wnl.  Follow.   

## 2014-01-29 NOTE — Assessment & Plan Note (Signed)
Stable on current regimen   

## 2014-01-29 NOTE — Assessment & Plan Note (Signed)
White blood cell count just slightly decreased.  Follow cbc to confirm stable/normal.    

## 2014-01-29 NOTE — Assessment & Plan Note (Signed)
Breathing stable on symbicort.  Follow.  Being followed at [facility].    

## 2014-01-30 ENCOUNTER — Encounter: Payer: Self-pay | Admitting: *Deleted

## 2014-01-31 LAB — URINALYSIS, ROUTINE W REFLEX MICROSCOPIC

## 2014-01-31 NOTE — Telephone Encounter (Signed)
Mailed unread message to pt  

## 2014-02-02 ENCOUNTER — Telehealth: Payer: Self-pay | Admitting: *Deleted

## 2014-02-02 MED ORDER — HYDROCORTISONE ACETATE 25 MG RE SUPP: 25.0000 mg | Freq: Two times a day (BID) | RECTAL | Status: DC | PRN

## 2014-02-02 NOTE — Telephone Encounter (Signed)
Message from [employer]: There are 2 sets of directions on Rx, please clarify.  Reads one supp bid prn and also 1 supp at night prn

## 2014-02-02 NOTE — Telephone Encounter (Signed)
Bid prn is correct.

## 2014-05-03 ENCOUNTER — Ambulatory Visit: Payer: Self-pay | Admitting: Internal Medicine

## 2014-05-03 LAB — HM MAMMOGRAPHY: HM Mammogram: NEGATIVE

## 2014-05-04 ENCOUNTER — Encounter: Payer: Self-pay | Admitting: *Deleted

## 2014-05-12 ENCOUNTER — Ambulatory Visit: Payer: Self-pay | Admitting: Unknown Physician Specialty

## 2014-05-12 LAB — HM COLONOSCOPY

## 2014-05-16 ENCOUNTER — Other Ambulatory Visit: Payer: Self-pay | Admitting: Internal Medicine

## 2014-05-26 ENCOUNTER — Encounter: Payer: Self-pay | Admitting: Internal Medicine

## 2014-05-26 DIAGNOSIS — Z86010 Personal history of colonic polyps: Secondary | ICD-10-CM

## 2014-05-29 DIAGNOSIS — Z86010 Personal history of colonic polyps: Secondary | ICD-10-CM | POA: Insufficient documentation

## 2014-06-05 ENCOUNTER — Encounter: Payer: Self-pay | Admitting: Internal Medicine

## 2014-07-25 ENCOUNTER — Other Ambulatory Visit (INDEPENDENT_AMBULATORY_CARE_PROVIDER_SITE_OTHER): Payer: BC Managed Care – PPO

## 2014-07-25 ENCOUNTER — Telehealth: Payer: Self-pay | Admitting: *Deleted

## 2014-07-25 DIAGNOSIS — E7800 Pure hypercholesterolemia, unspecified: Secondary | ICD-10-CM

## 2014-07-25 DIAGNOSIS — D72819 Decreased white blood cell count, unspecified: Secondary | ICD-10-CM

## 2014-07-25 DIAGNOSIS — I10 Essential (primary) hypertension: Secondary | ICD-10-CM

## 2014-07-25 DIAGNOSIS — K219 Gastro-esophageal reflux disease without esophagitis: Secondary | ICD-10-CM

## 2014-07-25 LAB — CBC WITH DIFFERENTIAL/PLATELET
BASOS PCT: 0.9 % (ref 0.0–3.0)
Basophils Absolute: 0 10*3/uL (ref 0.0–0.1)
Eosinophils Absolute: 0.3 10*3/uL (ref 0.0–0.7)
Eosinophils Relative: 7.6 % — ABNORMAL HIGH (ref 0.0–5.0)
HCT: 38.1 % (ref 36.0–46.0)
HEMOGLOBIN: 12.7 g/dL (ref 12.0–15.0)
Lymphocytes Relative: 49.2 % — ABNORMAL HIGH (ref 12.0–46.0)
Lymphs Abs: 1.8 10*3/uL (ref 0.7–4.0)
MCHC: 33.5 g/dL (ref 30.0–36.0)
MCV: 85 fl (ref 78.0–100.0)
MONOS PCT: 9.5 % (ref 3.0–12.0)
Monocytes Absolute: 0.3 10*3/uL (ref 0.1–1.0)
NEUTROS ABS: 1.2 10*3/uL — AB (ref 1.4–7.7)
Neutrophils Relative %: 32.8 % — ABNORMAL LOW (ref 43.0–77.0)
Platelets: 180 10*3/uL (ref 150.0–400.0)
RBC: 4.48 Mil/uL (ref 3.87–5.11)
RDW: 15.1 % (ref 11.5–15.5)
WBC: 3.6 10*3/uL — ABNORMAL LOW (ref 4.0–10.5)

## 2014-07-25 NOTE — Telephone Encounter (Signed)
Orders placed for labs

## 2014-07-25 NOTE — Telephone Encounter (Signed)
What labs and dx?  

## 2014-07-26 ENCOUNTER — Encounter: Payer: Self-pay | Admitting: Internal Medicine

## 2014-07-26 LAB — LIPID PANEL
CHOLESTEROL: 151 mg/dL (ref 0–200)
HDL: 42.1 mg/dL (ref >39.00)
LDL Cholesterol: 93 mg/dL (ref 0–99)
NonHDL: 108.9
TRIGLYCERIDES: 79 mg/dL (ref 0.0–149.0)
Total CHOL/HDL Ratio: 4
VLDL: 15.8 mg/dL (ref 0.0–40.0)

## 2014-07-26 LAB — COMPREHENSIVE METABOLIC PANEL
ALT: 27 U/L (ref 0–35)
AST: 33 U/L (ref 0–37)
Albumin: 4 g/dL (ref 3.5–5.2)
Alkaline Phosphatase: 50 U/L (ref 39–117)
BILIRUBIN TOTAL: 0.4 mg/dL (ref 0.2–1.2)
BUN: 12 mg/dL (ref 6–23)
CALCIUM: 9.6 mg/dL (ref 8.4–10.5)
CHLORIDE: 110 meq/L (ref 96–112)
CO2: 24 meq/L (ref 19–32)
CREATININE: 0.7 mg/dL (ref 0.4–1.2)
GFR: 83.77 mL/min (ref >60.00)
GLUCOSE: 92 mg/dL (ref 70–99)
Potassium: 4.2 mEq/L (ref 3.5–5.1)
Sodium: 141 mEq/L (ref 135–145)
Total Protein: 7.1 g/dL (ref 6.0–8.3)

## 2014-07-26 LAB — TSH: TSH: 3.55 u[IU]/mL (ref 0.35–4.50)

## 2014-07-28 ENCOUNTER — Ambulatory Visit (INDEPENDENT_AMBULATORY_CARE_PROVIDER_SITE_OTHER): Payer: BC Managed Care – PPO | Admitting: Internal Medicine

## 2014-07-28 ENCOUNTER — Encounter: Payer: Self-pay | Admitting: Internal Medicine

## 2014-07-28 VITALS — BP 130/78 | HR 82 | Temp 97.9°F | Ht 65.5 in | Wt 175.0 lb

## 2014-07-28 DIAGNOSIS — I10 Essential (primary) hypertension: Secondary | ICD-10-CM

## 2014-07-28 DIAGNOSIS — D72819 Decreased white blood cell count, unspecified: Secondary | ICD-10-CM

## 2014-07-28 DIAGNOSIS — K219 Gastro-esophageal reflux disease without esophagitis: Secondary | ICD-10-CM

## 2014-07-28 DIAGNOSIS — J4520 Mild intermittent asthma, uncomplicated: Secondary | ICD-10-CM

## 2014-07-28 DIAGNOSIS — Z23 Encounter for immunization: Secondary | ICD-10-CM

## 2014-07-28 DIAGNOSIS — J45909 Unspecified asthma, uncomplicated: Secondary | ICD-10-CM

## 2014-07-28 DIAGNOSIS — J309 Allergic rhinitis, unspecified: Secondary | ICD-10-CM

## 2014-07-28 DIAGNOSIS — Z86010 Personal history of colonic polyps: Secondary | ICD-10-CM

## 2014-07-28 NOTE — Progress Notes (Signed)
Subjective:    Patient ID: Chloe Moyer, female    DOB: 04/30/1951, 63 y.o.   MRN: [id number]  HPI 63 year old female with past history of asthma, sinus problems, allergies and hypertension who comes in today to follow up on these issues as well as for a complete physical exam.   She states she feels she is doing well.  No cardiac symptoms with increased activity or exertion.  On prilosec for acid reflux.  Uses her inhalers.  Breathing stable.  No significant sinus or allergy symptoms.  No nausea or vomiting.  Her blood pressure has been doing well.  Being followed at [facility].  Due to f/u with Dr Senior (10/15) for her sinus.  Overall feels good and feels she is doing well.     Past Medical History  Diagnosis Date  . Asthma   . GERD (gastroesophageal reflux disease)   . Allergic rhinitis   . Diverticulosis, sigmoid   . Hypertension 06-12-2005  . Arthritis   . Ulcer     Outpatient Encounter Prescriptions as of 07/28/2014  Medication Sig  . albuterol (PROVENTIL HFA;VENTOLIN HFA) 108 (90 BASE) MCG/ACT inhaler Inhale 2 puffs using inhaler every 4-6 hours as needed for SOB/wheeze.  . budesonide (PULMICORT) 0.5 MG/2ML nebulizer solution Take 0.5 mg by nebulization 2 (two) times daily.  . budesonide-formoterol (SYMBICORT) 160-4.5 MCG/ACT inhaler Inhale 2 puffs into the lungs daily. as directed.  . diphenhydrAMINE (BENADRYL) 25 mg capsule Take 25 mg by mouth every 6 (six) hours as needed.    . hydrocortisone (ANUSOL-HC) 25 MG suppository Place 1 suppository (25 mg total) rectally 2 (two) times daily as needed for hemorrhoids or itching.  . hydrOXYzine (ATARAX/VISTARIL) 25 MG tablet Take 1 tablet (25 mg total) by mouth daily. Take one tablet by mouth once a day as needed.  . Hypertonic Nasal Wash (SINUS RINSE NA) As directed three times daily   . losartan (COZAAR) 25 MG tablet TAKE 1 TABLET DAILY  . omalizumab (XOLAIR) 150 MG injection Inject subcutaneously twice a week  . ranitidine (ZANTAC)  150 MG tablet Take 150 mg by mouth at bedtime.  Marland Kitchen azelastine (ASTELIN) 137 MCG/SPRAY nasal spray Place 1 spray into the nose 2 (two) times daily. Use in each nostril as directed  . [DISCONTINUED] omeprazole (PRILOSEC) 20 MG capsule Take 40 mg by mouth daily.     Review of Systems Patient denies any headache, lightheadedness or dizziness.  No significant sinus or allergy symptoms.  No chest pain, tightness or palpitations.  No increased shortness of breath, cough or congestion.  Acid reflux.  Controlled on prilosec.  No nausea or vomiting.  No abdominal pain or cramping.  No bowel change, such as diarrhea, constipation.   No urine change.         Objective:   Physical Exam  Filed Vitals:   07/28/14 0826  BP: 130/78  Pulse: 82  Temp: 97.9 F (36.6 C)   Blood pressure recheck:  72/85  63 year old female in no acute distress.   HEENT:  Nares- clear.  Oropharynx - without lesions. NECK:  Supple.  Nontender.  No audible bruit.  HEART:  Appears to be regular. LUNGS:  No crackles or wheezing audible.  Respirations even and unlabored.  RADIAL PULSE:  Equal bilaterally.    BREASTS:  No nipple discharge or nipple retraction present.  Could not appreciate any distinct nodules or axillary adenopathy.  ABDOMEN:  Soft, nontender.  Bowel sounds present and normal.  No  audible abdominal bruit.  GU:  Not performed.  Pt declined pap this year.    EXTREMITIES:  No increased edema present.  DP pulses palpable and equal bilaterally.      FEET:  No lesions.        Assessment & Plan:  GI.  Had colonoscopy 04/07/14.  (Dr Tiffany Kocher).  One polyp.  Recommended f/u in five years.    CARDIOVASCULAR.  Currently asymptomatic.  Had a heart catheterization -  negative.    HEALTH MAINTENANCE.  Physical today  Colonoscopy 04/07/14.   Mammogram 05/03/14 - Birads I.  Pap 07/13/13 - negative.     I spent 25 minutes with the patient and more than 50% of the time was spent in consultation regarding the above.

## 2014-07-28 NOTE — Progress Notes (Signed)
Pre visit review using our clinic review tool, if applicable. No additional management support is needed unless otherwise documented below in the visit note. 

## 2014-07-29 ENCOUNTER — Encounter: Payer: Self-pay | Admitting: Internal Medicine

## 2014-07-29 NOTE — Assessment & Plan Note (Signed)
Breathing stable on symbicort.  Follow.  Being followed at [facility].

## 2014-07-29 NOTE — Assessment & Plan Note (Signed)
Colonoscopy with one polyp removed (05/12/14).  Recommended f/u colonoscopy in five years.

## 2014-07-29 NOTE — Assessment & Plan Note (Signed)
Stable on current regimen.  Due to f/u with Dr Senior in 10/15.

## 2014-07-29 NOTE — Assessment & Plan Note (Signed)
On losartan 25mg  q day.  Blood pressure doing well.  Recent Cr and potassium wnl.  Follow.

## 2014-07-29 NOTE — Assessment & Plan Note (Signed)
White blood cell count just slightly decreased.  Follow cbc to confirm stable/normal.

## 2014-07-29 NOTE — Assessment & Plan Note (Signed)
Controlled.  Follow.   

## 2014-08-22 ENCOUNTER — Encounter: Payer: Self-pay | Admitting: Internal Medicine

## 2014-08-22 ENCOUNTER — Other Ambulatory Visit (INDEPENDENT_AMBULATORY_CARE_PROVIDER_SITE_OTHER): Payer: BC Managed Care – PPO

## 2014-08-22 ENCOUNTER — Other Ambulatory Visit: Payer: Self-pay | Admitting: Internal Medicine

## 2014-08-22 DIAGNOSIS — D72819 Decreased white blood cell count, unspecified: Secondary | ICD-10-CM

## 2014-08-22 LAB — CBC WITH DIFFERENTIAL/PLATELET
BASOS ABS: 0 10*3/uL (ref 0.0–0.1)
BASOS PCT: 1.2 % (ref 0.0–3.0)
EOS ABS: 0.3 10*3/uL (ref 0.0–0.7)
Eosinophils Relative: 8.6 % — ABNORMAL HIGH (ref 0.0–5.0)
HEMATOCRIT: 39.3 % (ref 36.0–46.0)
Hemoglobin: 13.1 g/dL (ref 12.0–15.0)
LYMPHS ABS: 1.4 10*3/uL (ref 0.7–4.0)
LYMPHS PCT: 41.3 % (ref 12.0–46.0)
MCHC: 33.3 g/dL (ref 30.0–36.0)
MCV: 85.6 fl (ref 78.0–100.0)
Monocytes Absolute: 0.2 10*3/uL (ref 0.1–1.0)
Monocytes Relative: 7.2 % (ref 3.0–12.0)
Neutro Abs: 1.4 10*3/uL (ref 1.4–7.7)
Neutrophils Relative %: 41.7 % — ABNORMAL LOW (ref 43.0–77.0)
Platelets: 162 10*3/uL (ref 150.0–400.0)
RBC: 4.6 Mil/uL (ref 3.87–5.11)
RDW: 14.9 % (ref 11.5–15.5)
WBC: 3.5 10*3/uL — ABNORMAL LOW (ref 4.0–10.5)

## 2014-08-22 NOTE — Progress Notes (Signed)
Order placed for f/u labs.  

## 2014-08-23 ENCOUNTER — Encounter: Payer: Self-pay | Admitting: *Deleted

## 2014-09-07 ENCOUNTER — Encounter: Payer: Self-pay | Admitting: Internal Medicine

## 2014-09-07 DIAGNOSIS — Z9109 Other allergy status, other than to drugs and biological substances: Secondary | ICD-10-CM

## 2014-09-07 LAB — PULMONARY FUNCTION TEST

## 2014-09-07 NOTE — Telephone Encounter (Signed)
Order placed for referral to allergist.  °

## 2014-10-02 ENCOUNTER — Encounter: Payer: Self-pay | Admitting: Internal Medicine

## 2014-10-02 ENCOUNTER — Other Ambulatory Visit (INDEPENDENT_AMBULATORY_CARE_PROVIDER_SITE_OTHER): Payer: BC Managed Care – PPO

## 2014-10-02 DIAGNOSIS — D72819 Decreased white blood cell count, unspecified: Secondary | ICD-10-CM

## 2014-10-02 LAB — CBC WITH DIFFERENTIAL/PLATELET
Basophils Absolute: 0 10*3/uL (ref 0.0–0.1)
Basophils Relative: 1 % (ref 0.0–3.0)
EOS ABS: 0.2 10*3/uL (ref 0.0–0.7)
Eosinophils Relative: 6 % — ABNORMAL HIGH (ref 0.0–5.0)
HEMATOCRIT: 40.5 % (ref 36.0–46.0)
HEMOGLOBIN: 13.4 g/dL (ref 12.0–15.0)
LYMPHS ABS: 1.7 10*3/uL (ref 0.7–4.0)
LYMPHS PCT: 41.7 % (ref 12.0–46.0)
MCHC: 33 g/dL (ref 30.0–36.0)
MCV: 85.6 fl (ref 78.0–100.0)
MONOS PCT: 9.1 % (ref 3.0–12.0)
Monocytes Absolute: 0.4 10*3/uL (ref 0.1–1.0)
NEUTROS ABS: 1.7 10*3/uL (ref 1.4–7.7)
Neutrophils Relative %: 42.2 % — ABNORMAL LOW (ref 43.0–77.0)
Platelets: 188 10*3/uL (ref 150.0–400.0)
RBC: 4.74 Mil/uL (ref 3.87–5.11)
RDW: 15 % (ref 11.5–15.5)
WBC: 4 10*3/uL (ref 4.0–10.5)

## 2014-11-03 ENCOUNTER — Encounter: Payer: Self-pay | Admitting: Internal Medicine

## 2014-12-19 ENCOUNTER — Encounter: Payer: Self-pay | Admitting: Nurse Practitioner

## 2014-12-19 ENCOUNTER — Ambulatory Visit (INDEPENDENT_AMBULATORY_CARE_PROVIDER_SITE_OTHER)
Admission: RE | Admit: 2014-12-19 | Discharge: 2014-12-19 | Disposition: A | Discharge status: Home / Self Care | Payer: Medicare Other | Source: Ambulatory Visit | Attending: Nurse Practitioner | Admitting: Nurse Practitioner

## 2014-12-19 ENCOUNTER — Ambulatory Visit (INDEPENDENT_AMBULATORY_CARE_PROVIDER_SITE_OTHER): Payer: Medicare Other | Admitting: Nurse Practitioner

## 2014-12-19 ENCOUNTER — Encounter: Payer: Self-pay | Admitting: Internal Medicine

## 2014-12-19 VITALS — BP 118/72 | HR 82 | Temp 97.8°F | Resp 14 | Ht 65.5 in | Wt 179.1 lb

## 2014-12-19 DIAGNOSIS — R059 Cough, unspecified: Secondary | ICD-10-CM

## 2014-12-19 DIAGNOSIS — R05 Cough: Secondary | ICD-10-CM

## 2014-12-19 MED ORDER — BENZONATATE 100 MG PO CAPS: 100.0000 mg | ORAL_CAPSULE | Freq: Two times a day (BID) | ORAL | Status: DC | PRN

## 2014-12-19 NOTE — Progress Notes (Signed)
   Subjective:    Patient ID: Chloe Moyer, female    DOB: 11/29/1950, 64 y.o.   MRN: [id number]  HPI  Chloe Moyer is a 64 yo female with a CC of cough.   1) Christmas day this started, fever, cold symptoms, and it improved. Jan. 7th amoxicillin 500 mg x 1 week for a tooth extraction. Felt much better.  Milta Deiters Med nasal rinse- yesterday last time done  Dr. Prince Rome at [facility] Prednisone taper. - No symptoms relief.   Tight in chest, green phlegm from coughing, worse in morning, bad at night also. Breathing tx with nebulizer this am at 6 am   Review of Systems  Constitutional: Negative for fever, chills, diaphoresis and fatigue.  HENT: Positive for congestion. Negative for ear discharge, ear pain, facial swelling, postnasal drip, rhinorrhea, sinus pressure, sneezing and sore throat.   Eyes: Negative for visual disturbance.  Respiratory: Positive for chest tightness, shortness of breath and wheezing.        Breathing treatment was helpful   Cardiovascular: Negative for chest pain, palpitations and leg swelling.  Musculoskeletal: Negative for neck pain and neck stiffness.  Skin: Negative for rash.  Neurological: Negative for dizziness, light-headedness and headaches.      Objective:   Physical Exam  Constitutional: She is oriented to person, place, and time. She appears well-developed and well-nourished. No distress.  BP 118/72 mmHg  Pulse 82  Temp(Src) 97.8 F (36.6 C) (Oral)  Resp 14  Ht 5' 5.5" (1.664 m)  Wt 179 lb 1.9 oz (81.248 kg)  BMI 29.34 kg/m2  SpO2 96%  LMP 11/03/1996   HENT:  Head: Normocephalic and atraumatic.  Right Ear: External ear normal.  Left Ear: External ear normal.  Cardiovascular: Normal rate, regular rhythm, normal heart sounds and intact distal pulses.  Exam reveals no gallop and no friction rub.   No murmur heard. Pulmonary/Chest: Effort normal and breath sounds normal. No respiratory distress. She has no wheezes. She has no rales. She exhibits no  tenderness.  Neurological: She is alert and oriented to person, place, and time. No cranial nerve deficit. She exhibits normal muscle tone. Coordination normal.  Skin: Skin is warm and dry. No rash noted. She is not diaphoretic.  Psychiatric: She has a normal mood and affect. Her behavior is normal. Judgment and thought content normal.       Assessment & Plan:

## 2014-12-19 NOTE — Patient Instructions (Addendum)
Please visit [place] at [facility] for your Chest X-ray. They will have the order.   We will see what results say before going to antibiotics.   Try Robitussin DM and Tessalon perles for the cough in the meantime.

## 2014-12-21 DIAGNOSIS — R059 Cough, unspecified: Secondary | ICD-10-CM | POA: Insufficient documentation

## 2014-12-21 DIAGNOSIS — R05 Cough: Secondary | ICD-10-CM | POA: Insufficient documentation

## 2014-12-21 NOTE — Assessment & Plan Note (Signed)
Stable. Not improving. [facility] for Chest X-ray. Try Robitussin DM and Tessalon Perles. FU if worsening/failure to improve.

## 2015-01-15 ENCOUNTER — Other Ambulatory Visit: Payer: Self-pay

## 2015-01-15 MED ORDER — LOSARTAN POTASSIUM 25 MG PO TABS
25.0000 mg | ORAL_TABLET | Freq: Every day | ORAL | Status: DC
Start: 2015-01-15 — End: 2016-05-12

## 2015-01-15 MED ORDER — HYDROXYZINE HCL 25 MG PO TABS: 25.0000 mg | ORAL_TABLET | Freq: Every day | ORAL | Status: DC

## 2015-01-26 ENCOUNTER — Ambulatory Visit (INDEPENDENT_AMBULATORY_CARE_PROVIDER_SITE_OTHER): Payer: Medicare Other | Admitting: Internal Medicine

## 2015-01-26 ENCOUNTER — Encounter: Payer: Self-pay | Admitting: Internal Medicine

## 2015-01-26 VITALS — BP 112/75 | HR 74 | Temp 98.0°F | Ht 65.5 in | Wt 177.0 lb

## 2015-01-26 DIAGNOSIS — Z0000 Encounter for general adult medical examination without abnormal findings: Secondary | ICD-10-CM

## 2015-01-26 DIAGNOSIS — I10 Essential (primary) hypertension: Secondary | ICD-10-CM

## 2015-01-26 DIAGNOSIS — D72819 Decreased white blood cell count, unspecified: Secondary | ICD-10-CM

## 2015-01-26 DIAGNOSIS — K219 Gastro-esophageal reflux disease without esophagitis: Secondary | ICD-10-CM | POA: Diagnosis not present

## 2015-01-26 DIAGNOSIS — J309 Allergic rhinitis, unspecified: Secondary | ICD-10-CM

## 2015-01-26 DIAGNOSIS — R05 Cough: Secondary | ICD-10-CM

## 2015-01-26 DIAGNOSIS — J4520 Mild intermittent asthma, uncomplicated: Secondary | ICD-10-CM

## 2015-01-26 DIAGNOSIS — Z86010 Personal history of colonic polyps: Secondary | ICD-10-CM

## 2015-01-26 DIAGNOSIS — R059 Cough, unspecified: Secondary | ICD-10-CM

## 2015-01-26 NOTE — Progress Notes (Signed)
Pre visit review using our clinic review tool, if applicable. No additional management support is needed unless otherwise documented below in the visit note. 

## 2015-01-26 NOTE — Progress Notes (Signed)
Patient ID: Chloe Moyer, female   DOB: 06-03-1951, 64 y.o.   MRN: [id number]   Subjective:    Patient ID: Chloe Moyer, female    DOB: 1951-05-18, 64 y.o.   MRN: [id number]  HPI  Patient here for a scheduled follow up.  States she is doing well.  Breathing stable.  She is off allergy injections.  Breathing is stable.  No increased cough or congestion.  No acid reflux.  Eating and drinking well.  Tries to stay active.  No cardiac symptoms with increased activity or exertion.  Bowels stable.     Past Medical History  Diagnosis Date  . Asthma   . GERD (gastroesophageal reflux disease)   . Allergic rhinitis   . Diverticulosis, sigmoid   . Hypertension 06-12-2005  . Arthritis   . Ulcer     Current Outpatient Prescriptions on File Prior to Visit  Medication Sig Dispense Refill  . albuterol (PROVENTIL HFA;VENTOLIN HFA) 108 (90 BASE) MCG/ACT inhaler Inhale 2 puffs using inhaler every 4-6 hours as needed for SOB/wheeze.    . budesonide (PULMICORT) 0.5 MG/2ML nebulizer solution Take 0.5 mg by nebulization 2 (two) times daily.    . budesonide-formoterol (SYMBICORT) 160-4.5 MCG/ACT inhaler Inhale 2 puffs into the lungs daily. as directed.    Marland Kitchen EPINEPHrine 0.3 mg/0.3 mL IJ SOAJ injection Inject 0.3 mg into the muscle once.    . hydrocortisone (ANUSOL-HC) 25 MG suppository Place 1 suppository (25 mg total) rectally 2 (two) times daily as needed for hemorrhoids or itching. 12 suppository 3  . hydrOXYzine (ATARAX/VISTARIL) 25 MG tablet Take 1 tablet (25 mg total) by mouth daily. Take one tablet by mouth once a day as needed. 90 tablet 1  . Hypertonic Nasal Wash (SINUS RINSE NA) As directed three times daily     . losartan (COZAAR) 25 MG tablet Take 1 tablet (25 mg total) by mouth daily. 90 tablet 1  . omalizumab (XOLAIR) 150 MG injection Inject subcutaneously twice a week    . ranitidine (ZANTAC) 150 MG tablet Take 150 mg by mouth at bedtime.     No current facility-administered medications  on file prior to visit.    Review of Systems  Constitutional: Negative for appetite change and unexpected weight change.  HENT: Negative for congestion and sinus pressure.   Respiratory: Negative for cough, chest tightness and shortness of breath.   Cardiovascular: Negative for chest pain, palpitations and leg swelling.  Gastrointestinal: Negative for nausea, vomiting, abdominal pain and diarrhea.  Neurological: Negative for dizziness, light-headedness and headaches.       Objective:    Physical Exam  Constitutional: She appears well-developed and well-nourished. No distress.  HENT:  Nose: Nose normal.  Mouth/Throat: Oropharynx is clear and moist.  Neck: Neck supple. No thyromegaly present.  Cardiovascular: Normal rate and regular rhythm.   Pulmonary/Chest: Breath sounds normal. No respiratory distress. She has no wheezes.  Abdominal: Soft. Bowel sounds are normal. There is no tenderness.  Musculoskeletal: She exhibits no edema or tenderness.  Lymphadenopathy:    She has no cervical adenopathy.    BP 112/75 mmHg  Pulse 74  Temp(Src) 98 F (36.7 C) (Oral)  Ht 5' 5.5" (1.664 m)  Wt 177 lb (80.287 kg)  BMI 29.00 kg/m2  SpO2 97%  LMP 11/03/1996 Wt Readings from Last 3 Encounters:  01/26/15 177 lb (80.287 kg)  12/19/14 179 lb 1.9 oz (81.248 kg)  07/28/14 175 lb (79.379 kg)     Lab Results  Component Value  Date   WBC 4.0 10/02/2014   HGB 13.4 10/02/2014   HCT 40.5 10/02/2014   PLT 188.0 10/02/2014   GLUCOSE 92 07/25/2014   CHOL 151 07/25/2014   TRIG 79.0 07/25/2014   HDL 42.10 07/25/2014   LDLCALC 93 07/25/2014   ALT 27 07/25/2014   AST 33 07/25/2014   NA 141 07/25/2014   K 4.2 07/25/2014   CL 110 07/25/2014   CREATININE 0.7 07/25/2014   BUN 12 07/25/2014   CO2 24 07/25/2014   TSH 3.55 07/25/2014    Dg Chest 2 View  12/19/2014   CLINICAL DATA:  Six weeks of chronic cough; history of asthma. , nonsmoker  EXAM: CHEST  2 VIEW  COMPARISON:  Portable chest  x-ray as well as chest CT scan of May 27, 2011.  FINDINGS: The lungs are mildly hyperinflated and clear. The heart and pulmonary vascularity are normal. The mediastinum is normal in width. There is no pleural effusion. There is no pneumothorax or pneumomediastinum. The bony thorax is unremarkable.  IMPRESSION: COPD.  There is no active cardiopulmonary disease.   Electronically Signed   By: David  Martinique   On: 12/19/2014 16:14       Assessment & Plan:   Problem List Items Addressed This Visit    Allergic rhinitis    Stable.  Off allergy injections.        Asthma    Breathing stable.        Cough    Doing well.  Continue current medication regimen.       GERD (gastroesophageal reflux disease)    Symptoms controlled.        Health care maintenance    Physical 07/28/14.  Colonoscopy 04/07/14 - one polyp.  Recommended f/u in five years.  Mammogram 05/03/14 - Birads I.        History of colonic polyps    Colonoscopy 05/12/14.  Recommended f/u colonoscopy in five years.       Hypertension - Primary    Blood pressure doing well.  Same medication regimen.  Follow pressures.  Follow met b.       Leukopenia    Last wbc count 10/02/14 - 4.0.            Einar Pheasant, MD

## 2015-02-03 ENCOUNTER — Encounter: Payer: Self-pay | Admitting: Internal Medicine

## 2015-02-03 DIAGNOSIS — Z0000 Encounter for general adult medical examination without abnormal findings: Secondary | ICD-10-CM | POA: Insufficient documentation

## 2015-02-03 NOTE — Assessment & Plan Note (Signed)
Breathing stable.

## 2015-02-03 NOTE — Assessment & Plan Note (Signed)
Last wbc count 10/02/14 - 4.0.

## 2015-02-03 NOTE — Assessment & Plan Note (Signed)
Colonoscopy 05/12/14.  Recommended f/u colonoscopy in five years.

## 2015-02-03 NOTE — Assessment & Plan Note (Signed)
Symptoms controlled

## 2015-02-03 NOTE — Assessment & Plan Note (Signed)
Physical 07/28/14.  Colonoscopy 04/07/14 - one polyp.  Recommended f/u in five years.  Mammogram 05/03/14 - Birads I.

## 2015-02-03 NOTE — Assessment & Plan Note (Signed)
Blood pressure doing well.  Same medication regimen.  Follow pressures.  Follow met b.

## 2015-02-03 NOTE — Assessment & Plan Note (Signed)
Stable.  Off allergy injections.

## 2015-02-03 NOTE — Assessment & Plan Note (Signed)
Doing well.  Continue current medication regimen

## 2015-03-21 ENCOUNTER — Ambulatory Visit (INDEPENDENT_AMBULATORY_CARE_PROVIDER_SITE_OTHER): Payer: Medicare Other

## 2015-03-21 VITALS — BP 122/72 | HR 83 | Temp 97.9°F | Resp 14 | Ht 65.5 in | Wt 178.5 lb

## 2015-03-21 DIAGNOSIS — Z0000 Encounter for general adult medical examination without abnormal findings: Secondary | ICD-10-CM

## 2015-03-21 DIAGNOSIS — Z780 Asymptomatic menopausal state: Secondary | ICD-10-CM | POA: Diagnosis not present

## 2015-03-21 DIAGNOSIS — Z23 Encounter for immunization: Secondary | ICD-10-CM

## 2015-03-21 NOTE — Patient Instructions (Addendum)
Chloe Moyer , Thank you for taking time to come for your Medicare Wellness Visit. I appreciate your ongoing commitment to your health goals. Please review the following plan we discussed and let me know if I can assist you in the future.   These are the goals we discussed: Goals    None      This is a list of the screening recommended for you and due dates:  Health Maintenance  Topic Date Due  . HIV Screening  10/13/1964  . DEXA scan (bone density measurement)  10/13/2014  . Pneumonia vaccines (1 of 2 - PCV13) 10/13/2014  . Flu Shot  06/18/2015  . Mammogram  05/03/2016  . Colon Cancer Screening  05/13/2019  . Tetanus Vaccine  04/29/2023  . Shingles Vaccine  Completed    Bone Densitometry Bone densitometry is a special X-ray that measures your bone density and can be used to help predict your risk of bone fractures. This test is used to determine bone mineral content and density to diagnose osteoporosis. Osteoporosis is the loss of bone that may cause the bone to become weak. Osteoporosis commonly occurs in women entering menopause. However, it may be found in men and in people with other diseases. PREPARATION FOR TEST No preparation necessary. WHO SHOULD BE TESTED?  All women older than 22.  Postmenopausal women (50 to 70) with risk factors for osteoporosis.  People with a previous fracture caused by normal activities.  People with a small body frame (less than 127 poundsor a body mass index [BMI] of less than 21).  People who have a parent with a hip fracture or history of osteoporosis.  People who smoke.  People who have rheumatoid arthritis.  Anyone who engages in excessive alcohol use (more than 3 drinks most days).  Women who experience early menopause. WHEN SHOULD YOU BE RETESTED? Current guidelines suggest that you should wait at least 2 years before doing a bone density test again if your first test was normal.Recent studies indicated that women with normal  bone density may be able to wait a few years before needing to repeat a bone density test. You should discuss this with your caregiver.  NORMAL FINDINGS   Normal: less than standard deviation below normal (greater than -1).  Osteopenia: 1 to 2.5 standard deviations below normal (-1 to -2.5).  Osteoporosis: greater than 2.5 standard deviations below normal (less than -2.5). Test results are reported as a "T score" and a "Z score."The T score is a number that compares your bone density with the bone density of healthy, young women.The Z score is a number that compares your bone density with the scores of women who are the same age, gender, and race.  Ranges for normal findings may vary among different laboratories and hospitals. You should always check with your doctor after having lab work or other tests done to discuss the meaning of your test results and whether your values are considered within normal limits. MEANING OF TEST  Your caregiver will go over the test results with you and discuss the importance and meaning of your results, as well as treatment options and the need for additional tests if necessary. OBTAINING THE TEST RESULTS It is your responsibility to obtain your test results. Ask the lab or department performing the test when and how you will get your results. Document Released: 11/25/2004 Document Revised: 01/26/2012 Document Reviewed: 12/18/2010 [facility] Patient Information 2015 [place], [place]. This information is not intended to replace advice given to  to you by your health care provider. Make sure you discuss any questions you have with your health care provider.  

## 2015-03-21 NOTE — Progress Notes (Signed)
Subjective:   Chloe Moyer is a 64 y.o. female who presents for an Initial Medicare Annual Wellness Visit.  Review of Systems      Cardiac Risk Factors include: hypertension     Objective:    Today's Vitals   03/21/15 1020  BP: 122/72  Pulse: 83  Temp: 97.9 F (36.6 C)  TempSrc: Oral  Resp: 14  Height: 5' 5.5" (1.664 m)  Weight: 178 lb 8 oz (80.967 kg)  SpO2: 95%    Current Medications (verified) Outpatient Encounter Prescriptions as of 03/21/2015  Medication Sig  . albuterol (PROVENTIL HFA;VENTOLIN HFA) 108 (90 BASE) MCG/ACT inhaler Inhale 2 puffs using inhaler every 4-6 hours as needed for SOB/wheeze.  . budesonide (PULMICORT) 0.5 MG/2ML nebulizer solution Take 0.5 mg by nebulization 2 (two) times daily.  . budesonide-formoterol (SYMBICORT) 160-4.5 MCG/ACT inhaler Inhale 2 puffs into the lungs daily. as directed.  Marland Kitchen EPINEPHrine 0.3 mg/0.3 mL IJ SOAJ injection Inject 0.3 mg into the muscle once.  . hydrocortisone (ANUSOL-HC) 25 MG suppository Place 1 suppository (25 mg total) rectally 2 (two) times daily as needed for hemorrhoids or itching.  . hydrOXYzine (ATARAX/VISTARIL) 25 MG tablet Take 1 tablet (25 mg total) by mouth daily. Take one tablet by mouth once a day as needed.  . Hypertonic Nasal Wash (SINUS RINSE NA) As directed three times daily   . losartan (COZAAR) 25 MG tablet Take 1 tablet (25 mg total) by mouth daily.  Marland Kitchen omalizumab (XOLAIR) 150 MG injection 150 mg every 14 (fourteen) days.  . ranitidine (ZANTAC) 150 MG tablet Take 150 mg by mouth at bedtime.  . [DISCONTINUED] omalizumab Arvid Right) 150 MG injection Inject subcutaneously twice a week   No facility-administered encounter medications on file as of 03/21/2015.    Allergies (verified) Lisinopril; Septra; and Sulfa antibiotics   History: Past Medical History  Diagnosis Date  . Asthma   . GERD (gastroesophageal reflux disease)   . Allergic rhinitis   . Diverticulosis, sigmoid   . Hypertension  06-12-2005  . Arthritis   . Ulcer    Past Surgical History  Procedure Laterality Date  . Cardiac catheterization  July 2012  . Nasal sinus surgery  July 2012  . Lipoma excision  4/99    Left flank area  . Cholecystectomy  1991  . Tubal ligation  1984   Family History  Problem Relation Age of Onset  . Emphysema Father     smoked  . Asthma Father   . Hypertension Mother   . Breast cancer Maternal Grandmother   . Lung cancer Father     smoked  . Hypertension Father   . Heart disease Paternal Grandfather     myocardial infarction  . Stroke Maternal Grandfather    Social History   Occupational History  . Works at [employer]    Social History Main Topics  . Smoking status: Never Smoker   . Smokeless tobacco: Never Used  . Alcohol Use: 0.0 oz/week    0 Standard drinks or equivalent per week     Comment: occ wine with dinner  . Drug Use: No  . Sexual Activity: Not on file    Tobacco Counseling Counseling given: Not Answered   Activities of Daily Living In your present state of health, do you have any difficulty performing the following activities: 03/21/2015  Hearing? N  Vision? N  Difficulty concentrating or making decisions? N  Walking or climbing stairs? N  Dressing or bathing? N  Doing  errands, shopping? N  Preparing Food and eating ? N  Using the Toilet? N  In the past six months, have you accidently leaked urine? N  Do you have problems with loss of bowel control? N  Managing your Medications? N  Managing your Finances? N  Housekeeping or managing your Housekeeping? N    Immunizations and Health Maintenance Immunization History  Administered Date(s) Administered  . Influenza Split 09/27/2012, 09/27/2012  . Influenza,inj,Quad PF,36+ Mos 07/28/2014  . Tdap 04/28/2013  . Zoster 02/23/2014   Health Maintenance Due  Topic Date Due  . HIV Screening  10/13/1964  . DEXA SCAN  10/13/2014  . PNA vac Low Risk Adult (1 of 2 - PCV13) 10/13/2014     Patient Care Team: Einar Pheasant, MD as PCP - General (Internal Medicine) Beverly Gust, MD as Referring Physician (Unknown Physician Specialty) Laurena Spies, MD as Referring Physician (Pulmonary Disease) Michel Santee Senior, MD as Referring Physician (Otolaryngology)  Indicate any recent Medical Services you may have received from other than [facility] providers in the past year (date may be approximate).     Assessment:   This is a routine wellness examination for Chloe Moyer.   Hearing/Vision screen  Hearing Screening   125Hz  250Hz  500Hz  1000Hz  2000Hz  4000Hz  8000Hz   Right ear:   Pass Pass Pass Pass   Left ear:   Pass Pass Pass Pass     Visual Acuity Screening   Right eye Left eye Both eyes  Without correction:     With correction: 20/20 20/25 20/20   Comments: [facility] exam scheduled in July 2016   Dietary issues and exercise activities discussed: Current Exercise Habits:: Structured exercise class, Type of exercise: walking;Other - see comments;strength training/weights (running up stairs and core exercise), Time (Minutes): 60, Frequency (Times/Week): 2, Weekly Exercise (Minutes/Week): 120, Intensity: Moderate  Goals    None     Depression Screen PHQ 2/9 Scores 03/21/2015 10/10/2012  PHQ - 2 Score 0 0    Fall Risk Fall Risk  03/21/2015 10/10/2012  Falls in the past year? No No    Cognitive Function: MMSE - Mini Mental State Exam 03/21/2015  Orientation to time 5  Orientation to Place 5  Registration 3  Attention/ Calculation 5  Recall 3  Language- name 2 objects 2  Language- repeat 1  Language- follow 3 step command 3  Language- read & follow direction 1  Write a sentence 1  Copy design 1  Total score 30    Screening Tests Health Maintenance  Topic Date Due  . HIV Screening  10/13/1964  . DEXA SCAN  10/13/2014  . PNA vac Low Risk Adult (1 of 2 - PCV13) 10/13/2014  . INFLUENZA VACCINE  06/18/2015  . MAMMOGRAM  05/03/2016  . COLONOSCOPY   05/13/2019  . TETANUS/TDAP  04/29/2023  . ZOSTAVAX  Completed      Plan:     During the course of the visit, Chloe Moyer was educated and counseled about the following appropriate screening and preventive services:   Vaccines to include Pneumoccal, Influenza, Hepatitis B, Td, Zostavax, HCV  Electrocardiogram  Cardiovascular disease screening  Colorectal cancer screening  Bone density screening  Diabetes screening  Glaucoma screening  Mammography/PAP  Nutrition counseling  Smoking cessation counseling  Patient Instructions (the written plan) were given to the patient.    Chloe Bers, LPN   12/27/7987

## 2015-03-22 ENCOUNTER — Encounter: Payer: Self-pay | Admitting: Nurse Practitioner

## 2015-03-22 ENCOUNTER — Ambulatory Visit (INDEPENDENT_AMBULATORY_CARE_PROVIDER_SITE_OTHER): Payer: Medicare Other | Admitting: Nurse Practitioner

## 2015-03-22 ENCOUNTER — Telehealth: Payer: Self-pay | Admitting: *Deleted

## 2015-03-22 VITALS — BP 122/82 | HR 85 | Temp 98.0°F | Resp 12 | Ht 65.5 in | Wt 177.8 lb

## 2015-03-22 DIAGNOSIS — R399 Unspecified symptoms and signs involving the genitourinary system: Secondary | ICD-10-CM

## 2015-03-22 DIAGNOSIS — Z23 Encounter for immunization: Secondary | ICD-10-CM | POA: Diagnosis not present

## 2015-03-22 NOTE — Progress Notes (Signed)
   Subjective:    Patient ID: Chloe Moyer, female    DOB: Mar 12, 1951, 64 y.o.   MRN: [id number]  HPI  Chloe Moyer is a 64 yo female with a CC of urinary frequency, dysuria, started last Wednesday.   1) Urinating more often, pressure, dysuria intermittent Cranberry juice- low sugar  NO OTC medications   Review of Systems  Constitutional: Negative for fever, chills, diaphoresis and fatigue.  Respiratory: Negative for chest tightness, shortness of breath and wheezing.   Cardiovascular: Negative for chest pain, palpitations and leg swelling.  Gastrointestinal: Negative for nausea, vomiting and diarrhea.  Genitourinary: Positive for dysuria and frequency. Negative for urgency, hematuria, flank pain, enuresis and difficulty urinating.  Skin: Negative for rash.  Neurological: Negative for dizziness, weakness, numbness and headaches.  Psychiatric/Behavioral: The patient is not nervous/anxious.       Objective:   Physical Exam  Constitutional: She is oriented to person, place, and time. She appears well-developed and well-nourished. No distress.  BP 122/82 mmHg  Pulse 85  Temp(Src) 98 F (36.7 C) (Oral)  Resp 12  Ht 5' 5.5" (1.664 m)  Wt 177 lb 12.8 oz (80.65 kg)  BMI 29.13 kg/m2  SpO2 98%  LMP 11/03/1996   HENT:  Head: Normocephalic and atraumatic.  Right Ear: External ear normal.  Left Ear: External ear normal.  Cardiovascular: Normal rate, regular rhythm and normal heart sounds.  Exam reveals no gallop and no friction rub.   No murmur heard. Pulmonary/Chest: Effort normal and breath sounds normal. No respiratory distress. She has no wheezes. She has no rales. She exhibits no tenderness.  Abdominal: There is no CVA tenderness.  Neurological: She is alert and oriented to person, place, and time. No cranial nerve deficit. She exhibits normal muscle tone. Coordination normal.  Skin: Skin is warm and dry. No rash noted. She is not diaphoretic.  Psychiatric: She has a normal  mood and affect. Her behavior is normal. Judgment and thought content normal.          Assessment & Plan:

## 2015-03-22 NOTE — Telephone Encounter (Signed)
uc

## 2015-03-22 NOTE — Patient Instructions (Signed)
We will call you with culture results. 

## 2015-03-22 NOTE — Progress Notes (Signed)
Pre visit review using our clinic review tool, if applicable. No additional management support is needed unless otherwise documented below in the visit note. 

## 2015-03-26 ENCOUNTER — Other Ambulatory Visit: Payer: Self-pay | Admitting: *Deleted

## 2015-03-26 LAB — URINE CULTURE: Colony Count: >=100000

## 2015-03-26 MED ORDER — CIPROFLOXACIN HCL 500 MG PO TABS: 500.0000 mg | ORAL_TABLET | Freq: Two times a day (BID) | ORAL | Status: AC

## 2015-03-27 ENCOUNTER — Ambulatory Visit
Admission: RE | Admit: 2015-03-27 | Discharge: 2015-03-27 | Disposition: A | Discharge status: Home / Self Care | Payer: BC Managed Care – PPO | Source: Ambulatory Visit | Attending: Internal Medicine | Admitting: Internal Medicine

## 2015-03-27 DIAGNOSIS — M8580 Other specified disorders of bone density and structure, unspecified site: Secondary | ICD-10-CM | POA: Insufficient documentation

## 2015-03-27 DIAGNOSIS — Z780 Asymptomatic menopausal state: Secondary | ICD-10-CM

## 2015-03-28 ENCOUNTER — Encounter: Payer: Self-pay | Admitting: Internal Medicine

## 2015-03-30 NOTE — Telephone Encounter (Signed)
Unread mychart message mailed to patient 

## 2015-03-31 DIAGNOSIS — R399 Unspecified symptoms and signs involving the genitourinary system: Secondary | ICD-10-CM | POA: Insufficient documentation

## 2015-03-31 NOTE — Assessment & Plan Note (Signed)
Will obtain a urine culture. POCT results were not placed in chart. I did view them and there was only trace leukocytes.

## 2015-07-28 ENCOUNTER — Emergency Department: Payer: Medicare Other

## 2015-07-28 ENCOUNTER — Emergency Department
Admission: EM | Admit: 2015-07-28 | Discharge: 2015-07-28 | Disposition: A | Discharge status: Home / Self Care | Payer: Medicare Other | Attending: Emergency Medicine | Admitting: Emergency Medicine

## 2015-07-28 ENCOUNTER — Encounter: Payer: Self-pay | Admitting: Emergency Medicine

## 2015-07-28 DIAGNOSIS — N390 Urinary tract infection, site not specified: Secondary | ICD-10-CM | POA: Diagnosis not present

## 2015-07-28 DIAGNOSIS — I10 Essential (primary) hypertension: Secondary | ICD-10-CM | POA: Insufficient documentation

## 2015-07-28 DIAGNOSIS — Z79899 Other long term (current) drug therapy: Secondary | ICD-10-CM | POA: Insufficient documentation

## 2015-07-28 DIAGNOSIS — R1031 Right lower quadrant pain: Secondary | ICD-10-CM

## 2015-07-28 DIAGNOSIS — Z7951 Long term (current) use of inhaled steroids: Secondary | ICD-10-CM | POA: Insufficient documentation

## 2015-07-28 DIAGNOSIS — Z792 Long term (current) use of antibiotics: Secondary | ICD-10-CM | POA: Insufficient documentation

## 2015-07-28 DIAGNOSIS — Z7952 Long term (current) use of systemic steroids: Secondary | ICD-10-CM | POA: Insufficient documentation

## 2015-07-28 LAB — COMPREHENSIVE METABOLIC PANEL
ALBUMIN: 4.6 g/dL (ref 3.5–5.0)
ALK PHOS: 58 U/L (ref 38–126)
ALT: 39 U/L (ref 14–54)
AST: 50 U/L — AB (ref 15–41)
Anion gap: 8 (ref 5–15)
BILIRUBIN TOTAL: 0.8 mg/dL (ref 0.3–1.2)
BUN: 11 mg/dL (ref 6–20)
CALCIUM: 9.7 mg/dL (ref 8.9–10.3)
CO2: 24 mmol/L (ref 22–32)
Chloride: 105 mmol/L (ref 101–111)
Creatinine, Ser: 0.82 mg/dL (ref 0.44–1.00)
GFR calc Af Amer: >60 mL/min (ref >60)
GFR calc non Af Amer: >60 mL/min (ref >60)
GLUCOSE: 102 mg/dL — AB (ref 65–99)
Potassium: 3.7 mmol/L (ref 3.5–5.1)
SODIUM: 137 mmol/L (ref 135–145)
TOTAL PROTEIN: 7.9 g/dL (ref 6.5–8.1)

## 2015-07-28 LAB — CBC WITH DIFFERENTIAL/PLATELET
BASOS ABS: 0.1 10*3/uL (ref 0–0.1)
BASOS PCT: 1 %
Eosinophils Absolute: 0.3 10*3/uL (ref 0–0.7)
Eosinophils Relative: 5 %
HEMATOCRIT: 39.8 % (ref 35.0–47.0)
HEMOGLOBIN: 13.3 g/dL (ref 12.0–16.0)
Lymphocytes Relative: 28 %
Lymphs Abs: 1.7 10*3/uL (ref 1.0–3.6)
MCH: 28.6 pg (ref 26.0–34.0)
MCHC: 33.3 g/dL (ref 32.0–36.0)
MCV: 85.9 fL (ref 80.0–100.0)
Monocytes Absolute: 0.5 10*3/uL (ref 0.2–0.9)
Monocytes Relative: 8 %
NEUTROS ABS: 3.3 10*3/uL (ref 1.4–6.5)
NEUTROS PCT: 58 %
Platelets: 171 10*3/uL (ref 150–440)
RBC: 4.64 MIL/uL (ref 3.80–5.20)
RDW: 14.7 % — ABNORMAL HIGH (ref 11.5–14.5)
WBC: 5.8 10*3/uL (ref 3.6–11.0)

## 2015-07-28 LAB — URINALYSIS COMPLETE WITH MICROSCOPIC (ARMC ONLY)
BILIRUBIN URINE: NEGATIVE
Glucose, UA: NEGATIVE mg/dL
HGB URINE DIPSTICK: NEGATIVE
Ketones, ur: NEGATIVE mg/dL
Nitrite: NEGATIVE
PH: 6 (ref 5.0–8.0)
Protein, ur: NEGATIVE mg/dL
Specific Gravity, Urine: 1.002 — ABNORMAL LOW (ref 1.005–1.030)

## 2015-07-28 MED ORDER — ONDANSETRON HCL 4 MG/2ML IJ SOLN
4.0000 mg | Freq: Once | INTRAMUSCULAR | Status: AC
  Administered 2015-07-28: 4 mg via INTRAVENOUS
  Filled 2015-07-28: qty 2

## 2015-07-28 MED ORDER — SODIUM CHLORIDE 0.9 % IV BOLUS (SEPSIS)
1000.0000 mL | Freq: Once | INTRAVENOUS | Status: AC
  Administered 2015-07-28: 1000 mL via INTRAVENOUS
  Filled 2015-07-28: qty 1000

## 2015-07-28 MED ORDER — CEPHALEXIN 500 MG PO CAPS: 500.0000 mg | ORAL_CAPSULE | Freq: Three times a day (TID) | ORAL | Status: AC

## 2015-07-28 MED ORDER — CEPHALEXIN 500 MG PO CAPS
500.0000 mg | ORAL_CAPSULE | Freq: Once | ORAL | Status: AC
  Administered 2015-07-28: 500 mg via ORAL
  Filled 2015-07-28: qty 1

## 2015-07-28 MED ORDER — MORPHINE SULFATE (PF) 4 MG/ML IV SOLN
4.0000 mg | Freq: Once | INTRAVENOUS | Status: AC
Start: 2015-07-28 — End: 2015-07-28
  Administered 2015-07-28: 4 mg via INTRAVENOUS
  Filled 2015-07-28: qty 1

## 2015-07-28 NOTE — ED Notes (Signed)
Patient to CT.

## 2015-07-28 NOTE — Discharge Instructions (Signed)

## 2015-07-28 NOTE — ED Provider Notes (Signed)
[facility] Emergency Department Provider Note  ____________________________________________  Time seen: Approximately 297 PM  I have reviewed the triage vital signs and the nursing notes.   HISTORY  Chief Complaint Groin Pain    HPI Chloe Moyer is a 64 y.o. female with a history of a tubal ligation cholecystectomy who is presenting today with right lower quadrant pain over the past week. She had some nausea last night but otherwise has been tolerating by mouth. No problems with moving her bowels. She has no increased pain with urination. Has had a salivary gland stone but no history of kidney stones. No dysuria and has not noticed any blood in her urine. Denies any chest pain or diaphoresis. Says that the pain is waxing and waning and is a 7 out of 10 at this time. No exacerbating factors.She describes the pain is of a tearing type that is going through to her back. When asked exactly where the pain is she points in her right groin just proximal to the inguinal ligament.   Past Medical History  Diagnosis Date  . Asthma   . GERD (gastroesophageal reflux disease)   . Allergic rhinitis   . Diverticulosis, sigmoid   . Hypertension 06-12-2005  . Arthritis   . Ulcer     Patient Active Problem List   Diagnosis Date Noted  . UTI symptoms 03/31/2015  . Health care maintenance 02/03/2015  . Cough 12/21/2014  . History of colonic polyps 05/29/2014  . Leukopenia 07/07/2013  . Asthma 10/10/2012  . Allergic rhinitis 10/10/2012  . GERD (gastroesophageal reflux disease) 10/10/2012  . Hypertension 10/10/2012    Past Surgical History  Procedure Laterality Date  . Cardiac catheterization  July 2012  . Nasal sinus surgery  July 2012  . Lipoma excision  4/99    Left flank area  . Cholecystectomy  1991  . Tubal ligation  1984    Current Outpatient Rx  Name  Route  Sig  Dispense  Refill  . albuterol (PROVENTIL HFA;VENTOLIN HFA) 108 (90 BASE) MCG/ACT  inhaler      Inhale 2 puffs using inhaler every 4-6 hours as needed for SOB/wheeze.         . budesonide (PULMICORT) 0.5 MG/2ML nebulizer solution   Nebulization   Take 0.5 mg by nebulization 2 (two) times daily.         . budesonide-formoterol (SYMBICORT) 160-4.5 MCG/ACT inhaler   Inhalation   Inhale 2 puffs into the lungs daily. as directed.         Marland Kitchen EPINEPHrine 0.3 mg/0.3 mL IJ SOAJ injection   Intramuscular   Inject 0.3 mg into the muscle once.         . hydrocortisone (ANUSOL-HC) 25 MG suppository   Rectal   Place 1 suppository (25 mg total) rectally 2 (two) times daily as needed for hemorrhoids or itching.   12 suppository   3   . hydrOXYzine (ATARAX/VISTARIL) 25 MG tablet   Oral   Take 1 tablet (25 mg total) by mouth daily. Take one tablet by mouth once a day as needed.   90 tablet   1   . Hypertonic Nasal Wash (SINUS RINSE NA)      As directed three times daily          . losartan (COZAAR) 25 MG tablet   Oral   Take 1 tablet (25 mg total) by mouth daily.   90 tablet   1   . omalizumab (XOLAIR) 150 MG  injection      150 mg every 14 (fourteen) days.         . ranitidine (ZANTAC) 150 MG tablet   Oral   Take 150 mg by mouth at bedtime.           Allergies Lisinopril; Septra; and Sulfa antibiotics  Family History  Problem Relation Age of Onset  . Emphysema Father     smoked  . Asthma Father   . Hypertension Mother   . Breast cancer Maternal Grandmother   . Lung cancer Father     smoked  . Hypertension Father   . Heart disease Paternal Grandfather     myocardial infarction  . Stroke Maternal Grandfather     Social History Social History  Substance Use Topics  . Smoking status: Never Smoker   . Smokeless tobacco: Never Used  . Alcohol Use: 0.0 oz/week    0 Standard drinks or equivalent per week     Comment: occ wine with dinner    Review of Systems Constitutional: No fever/chills Eyes: No visual changes. ENT: No sore  throat. Cardiovascular: Denies chest pain. Respiratory: Denies shortness of breath. Gastrointestinal:  no vomiting.  No diarrhea.  No constipation. Genitourinary: Negative for dysuria. Musculoskeletal: As above  Skin: Negative for rash. Neurological: Negative for headaches, focal weakness or numbness.  10-point ROS otherwise negative.  ____________________________________________   PHYSICAL EXAM:  VITAL SIGNS: ED Triage Vitals  Enc Vitals Group     BP 07/28/15 1856 189/90 mmHg     Pulse Rate 07/28/15 1856 106     Resp 07/28/15 1856 20     Temp 07/28/15 1856 98.3 F (36.8 C)     Temp Source 07/28/15 1856 Oral     SpO2 07/28/15 1856 96 %     Weight 07/28/15 1856 177 lb (80.287 kg)     Height 07/28/15 1856 5\' 6"  (1.676 m)     Head Cir --      Peak Flow --      Pain Score 07/28/15 1857 8     Pain Loc --      Pain Edu? --      Excl. in [place]? --     Constitutional: Alert and oriented. Well appearing and in no acute distress. Eyes: Conjunctivae are normal. PERRL. EOMI. Head: Atraumatic. Nose: No congestion/rhinnorhea. Mouth/Throat: Mucous membranes are moist.  Oropharynx non-erythematous. Neck: No stridor.   Cardiovascular: Normal rate, regular rhythm. Grossly normal heart sounds.  Good peripheral circulation. Equal and bilateral radial as well as dorsalis pedis pulses. Respiratory: Normal respiratory effort.  No retractions. Lungs CTAB. Gastrointestinal: Soft and nontender. No palpable hernias including at the level of the inguinal ligament. There is no tenderness over McBurney's point. No distention. No abdominal bruits. No CVA tenderness. Musculoskeletal: No lower extremity tenderness nor edema.  No joint effusions. Neurologic:  Normal speech and language. No gross focal neurologic deficits are appreciated. No gait instability. Skin:  Skin is warm, dry and intact. No rash noted. Psychiatric: Mood and affect are normal. Speech and behavior are  normal.  ____________________________________________   LABS (all labs ordered are listed, but only abnormal results are displayed)  Labs Reviewed  CBC WITH DIFFERENTIAL/PLATELET - Abnormal; Notable for the following:    RDW 14.7 (*)    All other components within normal limits  COMPREHENSIVE METABOLIC PANEL - Abnormal; Notable for the following:    Glucose, Bld 102 (*)    AST 50 (*)    All other components within  normal limits  URINALYSIS COMPLETEWITH MICROSCOPIC ([facility] ONLY) - Abnormal; Notable for the following:    Color, Urine COLORLESS (*)    APPearance CLEAR (*)    Specific Gravity, Urine 1.002 (*)    Leukocytes, UA 2+ (*)    Bacteria, UA RARE (*)    Squamous Epithelial / LPF 0-5 (*)    All other components within normal limits   ____________________________________________  EKG   ____________________________________________  RADIOLOGY  Sigmoid diverticulosis without inflammation. No renal calculi. ____________________________________________   PROCEDURES   ____________________________________________   INITIAL IMPRESSION / ASSESSMENT AND PLAN / ED COURSE  Pertinent labs & imaging results that were available during my care of the patient were reviewed by me and considered in my medical decision making (see chart for details).  ----------------------------------------- 9:40 PM on 07/28/2015 -----------------------------------------  Patient is resting comfortably at this time. I discussed the lab results. She says that she has had similar pain in the past with urinary tract infections of this ripping and tearing type. Because of this subjective history did have some concern for vascular pathology. However since this is similar to the pain she's had in the past with urinary tract infections and she has equal and palpable pulses in all 4 extremities feel it is more likely related to her urinary tract pathology. We'll discharge the antibiotics. We'll treat with  Keflex. Culture sensitive to cefazolin in the past. ____________________________________________   FINAL CLINICAL IMPRESSION(S) / ED DIAGNOSES  Acute urinary tract infection. Initial visit.    Orbie Pyo, MD 07/28/15 2141

## 2015-07-28 NOTE — ED Notes (Signed)
Pt states starting Thursday she began having pain to the R lower groin area. Pt describes pain as ripping and tearing. Pt states pain 8/10 that radiates down into her lower back. Pt states she believed she had a UTI and drank "a gallon of water" with no relief.

## 2015-07-29 ENCOUNTER — Encounter: Payer: Self-pay | Admitting: Internal Medicine

## 2015-07-30 ENCOUNTER — Telehealth: Payer: Self-pay | Admitting: Emergency Medicine

## 2015-07-30 ENCOUNTER — Telehealth: Payer: Self-pay | Admitting: *Deleted

## 2015-07-30 ENCOUNTER — Other Ambulatory Visit (INDEPENDENT_AMBULATORY_CARE_PROVIDER_SITE_OTHER): Payer: Medicare Other

## 2015-07-30 DIAGNOSIS — I10 Essential (primary) hypertension: Secondary | ICD-10-CM

## 2015-07-30 LAB — LIPID PANEL
CHOLESTEROL: 134 mg/dL (ref 0–200)
HDL: 54.3 mg/dL (ref >39.00)
LDL CALC: 67 mg/dL (ref 0–99)
NonHDL: 79.43
TRIGLYCERIDES: 64 mg/dL (ref 0.0–149.0)
Total CHOL/HDL Ratio: 2
VLDL: 12.8 mg/dL (ref 0.0–40.0)

## 2015-07-30 LAB — HEPATIC FUNCTION PANEL
ALT: 92 U/L — AB (ref 0–35)
AST: 101 U/L — AB (ref 0–37)
Albumin: 4.4 g/dL (ref 3.5–5.2)
Alkaline Phosphatase: 55 U/L (ref 39–117)
BILIRUBIN TOTAL: 0.8 mg/dL (ref 0.2–1.2)
Bilirubin, Direct: 0.2 mg/dL (ref 0.0–0.3)
Total Protein: 7.4 g/dL (ref 6.0–8.3)

## 2015-07-30 LAB — BASIC METABOLIC PANEL
BUN: 8 mg/dL (ref 6–23)
CALCIUM: 9.7 mg/dL (ref 8.4–10.5)
CO2: 27 mEq/L (ref 19–32)
CREATININE: 0.72 mg/dL (ref 0.40–1.20)
Chloride: 105 mEq/L (ref 96–112)
GFR: 86.19 mL/min (ref >60.00)
GLUCOSE: 91 mg/dL (ref 70–99)
Potassium: 4.3 mEq/L (ref 3.5–5.1)
SODIUM: 139 meq/L (ref 135–145)

## 2015-07-30 LAB — URINE CULTURE: Culture: NO GROWTH

## 2015-07-30 LAB — TSH: TSH: 3.84 u[IU]/mL (ref 0.35–4.50)

## 2015-07-30 NOTE — ED Notes (Signed)
Patient called asking if urine culture could be done, as she had uti months a go.  i called her back and explained that we did do a urine culture and it has no growth as of 1 day. She has mychart and also will inform her md--especially if sx do not resolve.

## 2015-07-30 NOTE — Telephone Encounter (Signed)
Orders placed for labs

## 2015-07-30 NOTE — Telephone Encounter (Signed)
Labs and dx?  

## 2015-07-31 ENCOUNTER — Other Ambulatory Visit: Payer: Self-pay | Admitting: Internal Medicine

## 2015-07-31 DIAGNOSIS — R7989 Other specified abnormal findings of blood chemistry: Secondary | ICD-10-CM

## 2015-07-31 DIAGNOSIS — R945 Abnormal results of liver function studies: Secondary | ICD-10-CM

## 2015-07-31 NOTE — Progress Notes (Signed)
Order placed for abdominal ultrasound.  See lab note.

## 2015-08-01 ENCOUNTER — Encounter: Payer: Self-pay | Admitting: Internal Medicine

## 2015-08-01 ENCOUNTER — Ambulatory Visit (INDEPENDENT_AMBULATORY_CARE_PROVIDER_SITE_OTHER): Payer: Medicare Other | Admitting: Internal Medicine

## 2015-08-01 VITALS — BP 110/70 | HR 72 | Temp 97.9°F | Ht 65.25 in | Wt 175.4 lb

## 2015-08-01 DIAGNOSIS — D72819 Decreased white blood cell count, unspecified: Secondary | ICD-10-CM

## 2015-08-01 DIAGNOSIS — R7989 Other specified abnormal findings of blood chemistry: Secondary | ICD-10-CM

## 2015-08-01 DIAGNOSIS — I10 Essential (primary) hypertension: Secondary | ICD-10-CM | POA: Diagnosis not present

## 2015-08-01 DIAGNOSIS — R1031 Right lower quadrant pain: Secondary | ICD-10-CM

## 2015-08-01 DIAGNOSIS — R945 Abnormal results of liver function studies: Secondary | ICD-10-CM

## 2015-08-01 DIAGNOSIS — Z0000 Encounter for general adult medical examination without abnormal findings: Secondary | ICD-10-CM

## 2015-08-01 DIAGNOSIS — Z0001 Encounter for general adult medical examination with abnormal findings: Secondary | ICD-10-CM

## 2015-08-01 DIAGNOSIS — J4520 Mild intermittent asthma, uncomplicated: Secondary | ICD-10-CM

## 2015-08-01 DIAGNOSIS — Z86010 Personal history of colonic polyps: Secondary | ICD-10-CM

## 2015-08-01 DIAGNOSIS — Z1239 Encounter for other screening for malignant neoplasm of breast: Secondary | ICD-10-CM

## 2015-08-01 DIAGNOSIS — K219 Gastro-esophageal reflux disease without esophagitis: Secondary | ICD-10-CM

## 2015-08-01 NOTE — Progress Notes (Signed)
Pre-visit discussion using our clinic review tool. No additional management support is needed unless otherwise documented below in the visit note.  

## 2015-08-01 NOTE — Progress Notes (Signed)
Patient ID: Chloe Moyer, female   DOB: 06-Apr-1951, 64 y.o.   MRN: [id number]   Subjective:    Patient ID: Chloe Moyer, female    DOB: 25-Aug-1951, 64 y.o.   MRN: [id number]  HPI  Patient here to follow up on her cholesterol and recent elevated liver function tests and for her physical exam.  She is also here as an ER follow up.  States about 10 days ago, she developed right lower quadrant pain. Was minimal at first.  Increased this pain week.  Went to ER secondary to increased pain.  See ER note for details.  CT as outlined.  Diagnosed with UTI.  Treated with abx.  Comes in today stating that she is still having pain, but is better.  Persistent pain as outlined.  Pain radiates to lower back and to upper leg.  No pain down the leg.  No numbness or tingling.  No dysuria.  No hematuria.  Bowels stable.  No nausea or vomiting.     Past Medical History  Diagnosis Date  . Asthma   . GERD (gastroesophageal reflux disease)   . Allergic rhinitis   . Diverticulosis, sigmoid   . Hypertension 06-12-2005  . Arthritis   . Ulcer    Past Surgical History  Procedure Laterality Date  . Cardiac catheterization  July 2012  . Nasal sinus surgery  July 2012  . Lipoma excision  4/99    Left flank area  . Cholecystectomy  1991  . Tubal ligation  1984   Family History  Problem Relation Age of Onset  . Emphysema Father     smoked  . Asthma Father   . Hypertension Mother   . Breast cancer Maternal Grandmother   . Lung cancer Father     smoked  . Hypertension Father   . Heart disease Paternal Grandfather     myocardial infarction  . Stroke Maternal Grandfather    Social History   Social History  . Marital Status: Married    Spouse Name: N/A  . Number of Children: 2  . Years of Education: N/A   Occupational History  . Works at [employer]    Social History Main Topics  . Smoking status: Never Smoker   . Smokeless tobacco: Never Used  . Alcohol Use: 0.0 oz/week    0 Standard  drinks or equivalent per week     Comment: occ wine with dinner  . Drug Use: No  . Sexual Activity: Not Asked   Other Topics Concern  . None   Social History Narrative    Outpatient Encounter Prescriptions as of 08/01/2015  Medication Sig  . albuterol (PROVENTIL HFA;VENTOLIN HFA) 108 (90 BASE) MCG/ACT inhaler Inhale 2 puffs using inhaler every 4-6 hours as needed for SOB/wheeze.  . budesonide (PULMICORT) 0.5 MG/2ML nebulizer solution Take 0.5 mg by nebulization 2 (two) times daily.  . budesonide-formoterol (SYMBICORT) 160-4.5 MCG/ACT inhaler Inhale 2 puffs into the lungs daily. as directed.  . cephALEXin (KEFLEX) 500 MG capsule Take 1 capsule (500 mg total) by mouth 3 (three) times daily.  Marland Kitchen EPINEPHrine 0.3 mg/0.3 mL IJ SOAJ injection Inject 0.3 mg into the muscle once.  . hydrocortisone (ANUSOL-HC) 25 MG suppository Place 1 suppository (25 mg total) rectally 2 (two) times daily as needed for hemorrhoids or itching.  . hydrOXYzine (ATARAX/VISTARIL) 25 MG tablet Take 1 tablet (25 mg total) by mouth daily. Take one tablet by mouth once a day as needed.  . Hypertonic Nasal Wash (  SINUS RINSE NA) As directed three times daily   . losartan (COZAAR) 25 MG tablet Take 1 tablet (25 mg total) by mouth daily.  Marland Kitchen omalizumab (XOLAIR) 150 MG injection 150 mg every 14 (fourteen) days.  . [DISCONTINUED] ranitidine (ZANTAC) 150 MG tablet Take 150 mg by mouth at bedtime.   No facility-administered encounter medications on file as of 08/01/2015.    Review of Systems  Constitutional: Negative for appetite change and unexpected weight change.  HENT: Negative for congestion and sinus pressure.   Eyes: Negative for pain and visual disturbance.  Respiratory: Negative for cough, chest tightness and shortness of breath.   Cardiovascular: Negative for chest pain, palpitations and leg swelling.  Gastrointestinal: Positive for abdominal pain (right lower quadrant pain.  no other abdominal pain.  ) and  constipation. Negative for nausea, vomiting and diarrhea.  Genitourinary: Negative for dysuria, hematuria and difficulty urinating.  Musculoskeletal: Negative for back pain and joint swelling.  Skin: Negative for color change and rash.  Neurological: Negative for dizziness, light-headedness and headaches.  Hematological: Negative for adenopathy. Does not bruise/bleed easily.  Psychiatric/Behavioral: Negative for dysphoric mood and agitation.       Objective:    Physical Exam  Constitutional: She is oriented to person, place, and time. She appears well-developed and well-nourished. No distress.  HENT:  Nose: Nose normal.  Mouth/Throat: Oropharynx is clear and moist.  Eyes: Right eye exhibits no discharge. Left eye exhibits no discharge. No scleral icterus.  Neck: Neck supple. No thyromegaly present.  Cardiovascular: Normal rate and regular rhythm.   Pulmonary/Chest: Breath sounds normal. No accessory muscle usage. No tachypnea. No respiratory distress. She has no decreased breath sounds. She has no wheezes. She has no rhonchi. Right breast exhibits no inverted nipple, no mass, no nipple discharge and no tenderness (no axillary adenopathy). Left breast exhibits no inverted nipple, no mass, no nipple discharge and no tenderness (no axilarry adenopathy).  Abdominal: Soft. Bowel sounds are normal.  Right lower quadrant tenderness to palpation.    Musculoskeletal: She exhibits no edema or tenderness.  No pain with straight leg raise.  No pain with resistance against flexion and extension - lower extremities.    Lymphadenopathy:    She has no cervical adenopathy.  Neurological: She is alert and oriented to person, place, and time.  Skin: Skin is warm. No rash noted. No erythema.  Psychiatric: She has a normal mood and affect. Her behavior is normal.    BP 110/70 mmHg  Pulse 72  Temp(Src) 97.9 F (36.6 C) (Oral)  Ht 5' 5.25" (1.657 m)  Wt 175 lb 6 oz (79.55 kg)  BMI 28.97 kg/m2  SpO2  97%  LMP 11/03/1996 Wt Readings from Last 3 Encounters:  08/01/15 175 lb 6 oz (79.55 kg)  07/28/15 177 lb (80.287 kg)  03/22/15 177 lb 12.8 oz (80.65 kg)     Lab Results  Component Value Date   WBC 5.8 07/28/2015   HGB 13.3 07/28/2015   HCT 39.8 07/28/2015   PLT 171 07/28/2015   GLUCOSE 91 07/30/2015   CHOL 134 07/30/2015   TRIG 64.0 07/30/2015   HDL 54.30 07/30/2015   LDLCALC 67 07/30/2015   ALT 92* 07/30/2015   AST 101* 07/30/2015   NA 139 07/30/2015   K 4.3 07/30/2015   CL 105 07/30/2015   CREATININE 0.72 07/30/2015   BUN 8 07/30/2015   CO2 27 07/30/2015   TSH 3.84 07/30/2015    Ct Renal Stone Study  07/28/2015  CLINICAL DATA:  Acute right groin pain.  EXAM: CT ABDOMEN AND PELVIS WITHOUT CONTRAST  TECHNIQUE: Multidetector CT imaging of the abdomen and pelvis was performed following the standard protocol without IV contrast.  COMPARISON:  None.  FINDINGS: Moderate degenerative disc disease is noted at L3-4. Visualized lung bases appear normal.  Status post cholecystectomy. Fatty infiltration of the liver is noted. The spleen and pancreas appear normal. Adrenal glands and kidneys appear normal. No hydronephrosis or renal obstruction is noted. No renal or ureteral calculi are noted. The appendix appears normal. There is no evidence of bowel obstruction. Uterus and ovaries appear normal. Urinary bladder appears normal. Sigmoid diverticulosis is noted without inflammation. No significant adenopathy is noted. Small fat containing periumbilical hernia is noted.  IMPRESSION: Sigmoid diverticulosis without inflammation.  Fatty infiltration of the liver.  Small fat containing periumbilical hernia.  No hydronephrosis or renal obstruction is noted. No renal or ureteral calculi are noted.   Electronically Signed   By: Marijo Conception, M.D.   On: 07/28/2015 21:01       Assessment & Plan:   Problem List Items Addressed This Visit    Abnormal liver function tests    CT with fatty liver.   Recheck liver panel next week.       Relevant Orders   Hepatic function panel   Asthma    Breathing stable.        GERD (gastroesophageal reflux disease)    No acid reflux.  Follow.       Health care maintenance    Physical today 07/31/14.  Colonoscopy 04/07/14 - one polyp.  Recommended f/u in five years.  Mammogram 05/03/14 - Birads I.  Schedule f/u mammogram.       History of colonic polyps    Colonoscopy 05/12/14 as outlined.  Recommended f/u in five years.       Hypertension    Blood pressure under good control.  Continue same medication regimen.  Follow pressures.  Follow metabolic panel.        Leukopenia    Follow cbc.        Right lower quadrant pain    Persistent right lower quadrant pain.  CT as outlined.  Will obtain pelvic ultrasound.  Gentle use of antiinflammatories.  Tylenol as instructed.  Follow.        Relevant Orders   US Pelvis Complete   US Transvaginal Non-OB    Other Visit Diagnoses    Routine general medical examination at a health care facility    -  Primary    Screening breast examination        Relevant Orders    MM DIGITAL SCREENING BILATERAL        Einar Pheasant, MD

## 2015-08-04 ENCOUNTER — Encounter: Payer: Self-pay | Admitting: Internal Medicine

## 2015-08-04 DIAGNOSIS — R945 Abnormal results of liver function studies: Secondary | ICD-10-CM | POA: Insufficient documentation

## 2015-08-04 DIAGNOSIS — R7989 Other specified abnormal findings of blood chemistry: Secondary | ICD-10-CM | POA: Insufficient documentation

## 2015-08-04 DIAGNOSIS — R1031 Right lower quadrant pain: Secondary | ICD-10-CM | POA: Insufficient documentation

## 2015-08-04 NOTE — Assessment & Plan Note (Signed)
Colonoscopy 05/12/14 as outlined.  Recommended f/u in five years.

## 2015-08-04 NOTE — Assessment & Plan Note (Signed)
Persistent right lower quadrant pain.  CT as outlined.  Will obtain pelvic ultrasound.  Gentle use of antiinflammatories.  Tylenol as instructed.  Follow.

## 2015-08-04 NOTE — Assessment & Plan Note (Signed)
Blood pressure under good control.  Continue same medication regimen.  Follow pressures.  Follow metabolic panel.   

## 2015-08-04 NOTE — Assessment & Plan Note (Signed)
Breathing stable.

## 2015-08-04 NOTE — Assessment & Plan Note (Signed)
Physical today 07/31/14.  Colonoscopy 04/07/14 - one polyp.  Recommended f/u in five years.  Mammogram 05/03/14 - Birads I.  Schedule f/u mammogram.

## 2015-08-04 NOTE — Assessment & Plan Note (Signed)
No acid reflux.  Follow.

## 2015-08-04 NOTE — Assessment & Plan Note (Signed)
CT with fatty liver.  Recheck liver panel next week.

## 2015-08-04 NOTE — Assessment & Plan Note (Signed)
Follow cbc.  

## 2015-08-06 ENCOUNTER — Encounter: Payer: Self-pay | Admitting: Internal Medicine

## 2015-08-06 ENCOUNTER — Ambulatory Visit
Admission: RE | Admit: 2015-08-06 | Discharge: 2015-08-06 | Disposition: A | Discharge status: Home / Self Care | Payer: Medicare Other | Source: Ambulatory Visit | Attending: Internal Medicine | Admitting: Internal Medicine

## 2015-08-06 ENCOUNTER — Ambulatory Visit: Payer: Medicare Other

## 2015-08-06 DIAGNOSIS — R1031 Right lower quadrant pain: Secondary | ICD-10-CM

## 2015-08-08 ENCOUNTER — Ambulatory Visit
Admission: RE | Admit: 2015-08-08 | Discharge: 2015-08-08 | Disposition: A | Discharge status: Home / Self Care | Payer: Medicare Other | Source: Ambulatory Visit | Attending: Internal Medicine | Admitting: Internal Medicine

## 2015-08-08 DIAGNOSIS — Z1231 Encounter for screening mammogram for malignant neoplasm of breast: Secondary | ICD-10-CM | POA: Diagnosis not present

## 2015-08-08 DIAGNOSIS — Z1239 Encounter for other screening for malignant neoplasm of breast: Secondary | ICD-10-CM

## 2015-08-10 ENCOUNTER — Other Ambulatory Visit (INDEPENDENT_AMBULATORY_CARE_PROVIDER_SITE_OTHER): Payer: Medicare Other

## 2015-08-10 DIAGNOSIS — R7989 Other specified abnormal findings of blood chemistry: Secondary | ICD-10-CM

## 2015-08-10 DIAGNOSIS — R945 Abnormal results of liver function studies: Secondary | ICD-10-CM

## 2015-08-10 LAB — HEPATIC FUNCTION PANEL
ALT: 36 U/L — AB (ref 0–35)
AST: 40 U/L — ABNORMAL HIGH (ref 0–37)
Albumin: 4.5 g/dL (ref 3.5–5.2)
Alkaline Phosphatase: 61 U/L (ref 39–117)
BILIRUBIN DIRECT: 0.2 mg/dL (ref 0.0–0.3)
BILIRUBIN TOTAL: 0.6 mg/dL (ref 0.2–1.2)
Total Protein: 7.7 g/dL (ref 6.0–8.3)

## 2015-08-11 ENCOUNTER — Encounter: Payer: Self-pay | Admitting: Internal Medicine

## 2015-08-21 ENCOUNTER — Ambulatory Visit (INDEPENDENT_AMBULATORY_CARE_PROVIDER_SITE_OTHER): Payer: Medicare Other | Admitting: Internal Medicine

## 2015-08-21 ENCOUNTER — Encounter: Payer: Self-pay | Admitting: Internal Medicine

## 2015-08-21 VITALS — BP 118/80 | HR 69 | Temp 98.1°F | Resp 18 | Ht 65.25 in | Wt 177.4 lb

## 2015-08-21 DIAGNOSIS — R7989 Other specified abnormal findings of blood chemistry: Secondary | ICD-10-CM | POA: Diagnosis not present

## 2015-08-21 DIAGNOSIS — I10 Essential (primary) hypertension: Secondary | ICD-10-CM

## 2015-08-21 DIAGNOSIS — J4520 Mild intermittent asthma, uncomplicated: Secondary | ICD-10-CM | POA: Diagnosis not present

## 2015-08-21 DIAGNOSIS — R1031 Right lower quadrant pain: Secondary | ICD-10-CM | POA: Diagnosis not present

## 2015-08-21 DIAGNOSIS — Z23 Encounter for immunization: Secondary | ICD-10-CM

## 2015-08-21 DIAGNOSIS — R945 Abnormal results of liver function studies: Secondary | ICD-10-CM

## 2015-08-21 LAB — HEPATIC FUNCTION PANEL
ALBUMIN: 4.3 g/dL (ref 3.5–5.2)
ALT: 36 U/L — ABNORMAL HIGH (ref 0–35)
AST: 40 U/L — ABNORMAL HIGH (ref 0–37)
Alkaline Phosphatase: 56 U/L (ref 39–117)
Bilirubin, Direct: 0.1 mg/dL (ref 0.0–0.3)
TOTAL PROTEIN: 7.4 g/dL (ref 6.0–8.3)
Total Bilirubin: 0.4 mg/dL (ref 0.2–1.2)

## 2015-08-21 MED ORDER — HYDROXYZINE HCL 25 MG PO TABS: 25.0000 mg | ORAL_TABLET | Freq: Every day | ORAL | Status: DC

## 2015-08-21 NOTE — Progress Notes (Signed)
Patient ID: Chloe Moyer, female   DOB: [birth year], 64 y.o.   MRN: [id number]   Subjective:    Patient ID: Chloe Moyer, female    DOB: 02/18/1951, 64 y.o.   MRN: [id number]  HPI  Patient with past history of hypertension, abnormal liver function tests and asthma.  She comes in today for a scheduled follow up.  She had been having right lower quadrant pain.  See previous note.  Had CT and pelvic ultrasound that was unrevealing.  She was instructed to take antiinflammatories.  She started taking otc antiinflammatories.  Has helped.  She had been doing a lot of pushing and pulling and moving things prior to this starting.  Does have some degenerative changes noted in her back (noted on CT).  Virtually no pain now.  Overall feels she is doing well.  Eating and drinking well.  No nausea or vomiting.  Bowels stable.    Past Medical History  Diagnosis Date  . Asthma   . GERD (gastroesophageal reflux disease)   . Allergic rhinitis   . Diverticulosis, sigmoid   . Hypertension 06-12-2005  . Arthritis   . Ulcer    Past Surgical History  Procedure Laterality Date  . Cardiac catheterization  July 2012  . Nasal sinus surgery  July 2012  . Lipoma excision  4/99    Left flank area  . Cholecystectomy  1991  . Tubal ligation  1984  . Breast cyst aspiration Left 1990   Family History  Problem Relation Age of Onset  . Emphysema Father     smoked  . Asthma Father   . Lung cancer Father     smoked  . Hypertension Father   . Hypertension Mother   . Breast cancer Maternal Grandmother 60  . Heart disease Paternal Grandfather     myocardial infarction  . Stroke Maternal Grandfather    Social History   Social History  . Marital Status: Married    Spouse Name: N/A  . Number of Children: 2  . Years of Education: N/A   Occupational History  . Works at [employer]    Social History Main Topics  . Smoking status: Never Smoker   . Smokeless tobacco: Never Used  . Alcohol Use: 0.0  oz/week    0 Standard drinks or equivalent per week     Comment: occ wine with dinner  . Drug Use: No  . Sexual Activity: Not Asked   Other Topics Concern  . None   Social History Narrative    Outpatient Encounter Prescriptions as of 08/21/2015  Medication Sig  . albuterol (PROVENTIL HFA;VENTOLIN HFA) 108 (90 BASE) MCG/ACT inhaler Inhale 2 puffs using inhaler every 4-6 hours as needed for SOB/wheeze.  . budesonide (PULMICORT) 0.5 MG/2ML nebulizer solution Take 0.5 mg by nebulization 2 (two) times daily.  . budesonide-formoterol (SYMBICORT) 160-4.5 MCG/ACT inhaler Inhale 2 puffs into the lungs daily. as directed.  Marland Kitchen EPINEPHrine 0.3 mg/0.3 mL IJ SOAJ injection Inject 0.3 mg into the muscle once.  . hydrocortisone (ANUSOL-HC) 25 MG suppository Place 1 suppository (25 mg total) rectally 2 (two) times daily as needed for hemorrhoids or itching.  . hydrOXYzine (ATARAX/VISTARIL) 25 MG tablet Take 1 tablet (25 mg total) by mouth daily. Take one tablet by mouth once a day as needed.  . Hypertonic Nasal Wash (SINUS RINSE NA) As directed three times daily   . losartan (COZAAR) 25 MG tablet Take 1 tablet (25 mg total) by mouth daily.  Marland Kitchen  omalizumab (XOLAIR) 150 MG injection 150 mg every 14 (fourteen) days.  . [DISCONTINUED] hydrOXYzine (ATARAX/VISTARIL) 25 MG tablet Take 1 tablet (25 mg total) by mouth daily. Take one tablet by mouth once a day as needed.   No facility-administered encounter medications on file as of 08/21/2015.    Review of Systems  Constitutional: Negative for appetite change and unexpected weight change.  HENT: Negative for congestion and sinus pressure.   Respiratory: Negative for cough, chest tightness and shortness of breath.   Cardiovascular: Negative for chest pain and leg swelling.  Gastrointestinal: Negative for nausea, vomiting, abdominal pain (pain much improved.  ) and diarrhea.  Genitourinary: Negative for dysuria and difficulty urinating.  Musculoskeletal: Positive  for back pain (intermittent low back pain.  no significant pain. ).  Skin: Negative for color change and rash.       Objective:    Physical Exam  Constitutional: She appears well-developed and well-nourished. No distress.  Neck: Neck supple. No thyromegaly present.  Cardiovascular: Normal rate and regular rhythm.   Pulmonary/Chest: Breath sounds normal. No respiratory distress. She has no wheezes.  Abdominal: Soft. Bowel sounds are normal. There is no tenderness.  Musculoskeletal: She exhibits no edema or tenderness.  No significant back pain to palpation.    Lymphadenopathy:    She has no cervical adenopathy.  Skin: No rash noted. No erythema.  Psychiatric: She has a normal mood and affect. Her behavior is normal.    BP 118/80 mmHg  Pulse 69  Temp(Src) 98.1 F (36.7 C) (Oral)  Resp 18  Ht 5' 5.25" (1.657 m)  Wt 177 lb 6 oz (80.457 kg)  BMI 29.30 kg/m2  SpO2 94%  LMP 11/03/1996 Wt Readings from Last 3 Encounters:  08/21/15 177 lb 6 oz (80.457 kg)  08/01/15 175 lb 6 oz (79.55 kg)  07/28/15 177 lb (80.287 kg)     Lab Results  Component Value Date   WBC 5.8 07/28/2015   HGB 13.3 07/28/2015   HCT 39.8 07/28/2015   PLT 171 07/28/2015   GLUCOSE 91 07/30/2015   CHOL 134 07/30/2015   TRIG 64.0 07/30/2015   HDL 54.30 07/30/2015   LDLCALC 67 07/30/2015   ALT 36* 08/21/2015   AST 40* 08/21/2015   NA 139 07/30/2015   K 4.3 07/30/2015   CL 105 07/30/2015   CREATININE 0.72 07/30/2015   BUN 8 07/30/2015   CO2 27 07/30/2015   TSH 3.84 07/30/2015       Assessment & Plan:   Problem List Items Addressed This Visit    Abnormal liver function tests    Improved on last check.  CT unrevealing.  Recheck liver panel today.        Relevant Orders   Hepatic function panel (Completed)   Asthma    Breathing stable.       Hypertension    Blood pressure under good control.  Continue same medication regimen.  Follow pressures.  Follow metabolic panel.        Right lower  quadrant pain    Pelvic ultrasound and CT unrevealing.  Pain better after antiinflammatories.  Felt to be more msk in origin.  Some back issues.  Had been doing a lot of heavy lifting.  Is better.  Desires no further evaluation or treatment at this time.  Follow.         Other Visit Diagnoses    Encounter for immunization    -  Primary        Dominic Mahaney,  Randell Patient, MD

## 2015-08-21 NOTE — Patient Instructions (Signed)

## 2015-08-21 NOTE — Progress Notes (Signed)
Pre-visit discussion using our clinic review tool. No additional management support is needed unless otherwise documented below in the visit note.  

## 2015-08-22 ENCOUNTER — Encounter: Payer: Self-pay | Admitting: Internal Medicine

## 2015-08-24 NOTE — Telephone Encounter (Signed)
Unread mychart message mailed to patient 

## 2015-08-26 ENCOUNTER — Encounter: Payer: Self-pay | Admitting: Internal Medicine

## 2015-08-26 NOTE — Assessment & Plan Note (Signed)
Breathing stable.

## 2015-08-26 NOTE — Assessment & Plan Note (Signed)
Pelvic ultrasound and CT unrevealing.  Pain better after antiinflammatories.  Felt to be more msk in origin.  Some back issues.  Had been doing a lot of heavy lifting.  Is better.  Desires no further evaluation or treatment at this time.  Follow.

## 2015-08-26 NOTE — Assessment & Plan Note (Signed)
Improved on last check.  CT unrevealing.  Recheck liver panel today.

## 2015-08-26 NOTE — Assessment & Plan Note (Signed)
Blood pressure under good control.  Continue same medication regimen.  Follow pressures.  Follow metabolic panel.   

## 2015-11-06 ENCOUNTER — Ambulatory Visit (INDEPENDENT_AMBULATORY_CARE_PROVIDER_SITE_OTHER): Payer: Medicare Other | Admitting: Internal Medicine

## 2015-11-06 ENCOUNTER — Encounter: Payer: Self-pay | Admitting: Internal Medicine

## 2015-11-06 VITALS — BP 120/80 | HR 79 | Temp 97.8°F | Resp 18 | Ht 65.25 in | Wt 182.5 lb

## 2015-11-06 DIAGNOSIS — I10 Essential (primary) hypertension: Secondary | ICD-10-CM

## 2015-11-06 DIAGNOSIS — Z860100 Personal history of colon polyps, unspecified: Secondary | ICD-10-CM

## 2015-11-06 DIAGNOSIS — J309 Allergic rhinitis, unspecified: Secondary | ICD-10-CM | POA: Diagnosis not present

## 2015-11-06 DIAGNOSIS — J4520 Mild intermittent asthma, uncomplicated: Secondary | ICD-10-CM | POA: Diagnosis not present

## 2015-11-06 DIAGNOSIS — R945 Abnormal results of liver function studies: Secondary | ICD-10-CM

## 2015-11-06 DIAGNOSIS — R7989 Other specified abnormal findings of blood chemistry: Secondary | ICD-10-CM | POA: Diagnosis not present

## 2015-11-06 DIAGNOSIS — R1031 Right lower quadrant pain: Secondary | ICD-10-CM

## 2015-11-06 DIAGNOSIS — D72819 Decreased white blood cell count, unspecified: Secondary | ICD-10-CM

## 2015-11-06 LAB — BASIC METABOLIC PANEL
BUN: 10 mg/dL (ref 6–23)
CHLORIDE: 105 meq/L (ref 96–112)
CO2: 28 meq/L (ref 19–32)
CREATININE: 0.66 mg/dL (ref 0.40–1.20)
Calcium: 9.8 mg/dL (ref 8.4–10.5)
GFR: 95.22 mL/min (ref >60.00)
GLUCOSE: 91 mg/dL (ref 70–99)
POTASSIUM: 4.4 meq/L (ref 3.5–5.1)
Sodium: 140 mEq/L (ref 135–145)

## 2015-11-06 LAB — HEPATIC FUNCTION PANEL
ALK PHOS: 51 U/L (ref 39–117)
ALT: 30 U/L (ref 0–35)
AST: 31 U/L (ref 0–37)
Albumin: 4.2 g/dL (ref 3.5–5.2)
BILIRUBIN DIRECT: 0.1 mg/dL (ref 0.0–0.3)
BILIRUBIN TOTAL: 0.4 mg/dL (ref 0.2–1.2)
Total Protein: 7.1 g/dL (ref 6.0–8.3)

## 2015-11-06 NOTE — Progress Notes (Signed)
Patient ID: Chloe Moyer, female   DOB: 26-Jul-1951, 64 y.o.   MRN: [id number]   Subjective:    Patient ID: Chloe Moyer, female    DOB: 01-Aug-1951, 64 y.o.   MRN: [id number]  HPI  Patient with past history of asthma, GERD, hypertension and allergies.  She comes in today to follow up on these issues.  She reports she is doing well.  Still following up with ENT.  Breathing doing well.  Some minimal drainage.  No significant sinus issues.  No increased cough.  No chest pain or tightness.  No acid reflux.  No abdominal pain or cramping.  Bowels stable.  No abdominal pain.     Past Medical History  Diagnosis Date  . Asthma   . GERD (gastroesophageal reflux disease)   . Allergic rhinitis   . Diverticulosis, sigmoid   . Hypertension 06-12-2005  . Arthritis   . Ulcer    Past Surgical History  Procedure Laterality Date  . Cardiac catheterization  July 2012  . Nasal sinus surgery  July 2012  . Lipoma excision  4/99    Left flank area  . Cholecystectomy  1991  . Tubal ligation  1984  . Breast cyst aspiration Left 1990   Family History  Problem Relation Age of Onset  . Emphysema Father     smoked  . Asthma Father   . Lung cancer Father     smoked  . Hypertension Father   . Hypertension Mother   . Breast cancer Maternal Grandmother 60  . Heart disease Paternal Grandfather     myocardial infarction  . Stroke Maternal Grandfather    Social History   Social History  . Marital Status: Married    Spouse Name: N/A  . Number of Children: 2  . Years of Education: N/A   Occupational History  . Works at [employer]    Social History Main Topics  . Smoking status: Never Smoker   . Smokeless tobacco: Never Used  . Alcohol Use: 0.0 oz/week    0 Standard drinks or equivalent per week     Comment: occ wine with dinner  . Drug Use: No  . Sexual Activity: Not Asked   Other Topics Concern  . None   Social History Narrative    Outpatient Encounter Prescriptions as of  11/06/2015  Medication Sig  . albuterol (PROVENTIL HFA;VENTOLIN HFA) 108 (90 BASE) MCG/ACT inhaler Inhale 2 puffs using inhaler every 4-6 hours as needed for SOB/wheeze.  . budesonide (PULMICORT) 0.5 MG/2ML nebulizer solution Take 0.5 mg by nebulization 2 (two) times daily.  . budesonide-formoterol (SYMBICORT) 160-4.5 MCG/ACT inhaler Inhale 2 puffs into the lungs daily. as directed.  Marland Kitchen EPINEPHrine 0.3 mg/0.3 mL IJ SOAJ injection Inject 0.3 mg into the muscle once.  . hydrocortisone (ANUSOL-HC) 25 MG suppository Place 1 suppository (25 mg total) rectally 2 (two) times daily as needed for hemorrhoids or itching.  . hydrOXYzine (ATARAX/VISTARIL) 25 MG tablet Take 1 tablet (25 mg total) by mouth daily. Take one tablet by mouth once a day as needed.  . Hypertonic Nasal Wash (SINUS RINSE NA) As directed three times daily   . losartan (COZAAR) 25 MG tablet Take 1 tablet (25 mg total) by mouth daily.  Marland Kitchen omalizumab (XOLAIR) 150 MG injection 150 mg every 14 (fourteen) days.   No facility-administered encounter medications on file as of 11/06/2015.    Review of Systems  Constitutional: Negative for appetite change and unexpected weight change.  HENT: Positive  for postnasal drip. Negative for congestion and sinus pressure.   Eyes: Negative for discharge and redness.  Respiratory: Negative for cough, chest tightness and shortness of breath.   Cardiovascular: Negative for chest pain and palpitations.  Gastrointestinal: Negative for nausea, vomiting, abdominal pain and diarrhea.  Genitourinary: Negative for dysuria and difficulty urinating.  Musculoskeletal: Negative for back pain and joint swelling.  Skin: Negative for color change and rash.  Neurological: Negative for dizziness, light-headedness and headaches.  Psychiatric/Behavioral: Negative for dysphoric mood and agitation.       Objective:    Physical Exam  Constitutional: She appears well-developed and well-nourished. No distress.  HENT:    Nose: Nose normal.  Mouth/Throat: Oropharynx is clear and moist.  Neck: Neck supple. No thyromegaly present.  Cardiovascular: Normal rate and regular rhythm.   Pulmonary/Chest: Breath sounds normal. No respiratory distress. She has no wheezes.  Abdominal: Soft. Bowel sounds are normal. There is no tenderness.  Musculoskeletal: She exhibits no edema or tenderness.  Lymphadenopathy:    She has no cervical adenopathy.  Skin: No rash noted. No erythema.  Psychiatric: She has a normal mood and affect. Her behavior is normal.    BP 120/80 mmHg  Pulse 79  Temp(Src) 97.8 F (36.6 C) (Oral)  Resp 18  Ht 5' 5.25" (1.657 m)  Wt 182 lb 8 oz (82.781 kg)  BMI 30.15 kg/m2  SpO2 97%  LMP 11/03/1996 Wt Readings from Last 3 Encounters:  11/06/15 182 lb 8 oz (82.781 kg)  08/21/15 177 lb 6 oz (80.457 kg)  08/01/15 175 lb 6 oz (79.55 kg)     Lab Results  Component Value Date   WBC 5.8 07/28/2015   HGB 13.3 07/28/2015   HCT 39.8 07/28/2015   PLT 171 07/28/2015   GLUCOSE 91 11/06/2015   CHOL 134 07/30/2015   TRIG 64.0 07/30/2015   HDL 54.30 07/30/2015   LDLCALC 67 07/30/2015   ALT 30 11/06/2015   AST 31 11/06/2015   NA 140 11/06/2015   K 4.4 11/06/2015   CL 105 11/06/2015   CREATININE 0.66 11/06/2015   BUN 10 11/06/2015   CO2 28 11/06/2015   TSH 3.84 07/30/2015    Mm Digital Screening Bilateral  08/08/2015  CLINICAL DATA:  Screening. EXAM: DIGITAL SCREENING BILATERAL MAMMOGRAM WITH CAD COMPARISON:  Previous exam(s). ACR Breast Density Category b: There are scattered areas of fibroglandular density. FINDINGS: There are no findings suspicious for malignancy. Images were processed with CAD. IMPRESSION: No mammographic evidence of malignancy. A result letter of this screening mammogram will be mailed directly to the patient. RECOMMENDATION: Screening mammogram in one year. (Code:SM-B-01Y) BI-RADS CATEGORY  1: Negative. Electronically Signed   By: Pamelia Hoit M.D.   On: 08/08/2015 18:12        Assessment & Plan:   Problem List Items Addressed This Visit    Abnormal liver function tests - Primary    Recheck liver panel to confirm wnl.   Follow.       Relevant Orders   Hepatic function panel (Completed)   Hepatitis C antibody (Completed)   Allergic rhinitis    Doing well.  Continues to f/u with ENT      Asthma    Breathing doing well.  Follow.       History of colonic polyps    Colonoscopy 05/12/14.  Recommended f/u in five years.        Hypertension    Blood pressure under good control.  Continue same medication regimen.  Follow pressures.  Follow metabolic panel.        Relevant Orders   Basic metabolic panel (Completed)   Leukopenia    Follow cbc.       Right lower quadrant pain    Resolved.  Not an issue for her now.  Follow.            Einar Pheasant, MD

## 2015-11-06 NOTE — Progress Notes (Signed)
Pre-visit discussion using our clinic review tool. No additional management support is needed unless otherwise documented below in the visit note.  

## 2015-11-07 ENCOUNTER — Encounter: Payer: Self-pay | Admitting: Internal Medicine

## 2015-11-07 LAB — HEPATITIS C ANTIBODY: HCV Ab: NEGATIVE

## 2015-11-12 ENCOUNTER — Encounter: Payer: Self-pay | Admitting: Internal Medicine

## 2015-11-12 NOTE — Assessment & Plan Note (Signed)
Doing well.  Continues to f/u with ENT

## 2015-11-12 NOTE — Assessment & Plan Note (Signed)
Colonoscopy 05/12/14.  Recommended f/u in five years.

## 2015-11-12 NOTE — Assessment & Plan Note (Signed)
Resolved.  Not an issue for her now.  Follow.

## 2015-11-12 NOTE — Assessment & Plan Note (Signed)
Blood pressure under good control.  Continue same medication regimen.  Follow pressures.  Follow metabolic panel.   

## 2015-11-12 NOTE — Assessment & Plan Note (Addendum)
Recheck liver panel to confirm wnl.   Follow.

## 2015-11-12 NOTE — Assessment & Plan Note (Signed)
Breathing doing well.  Follow.   

## 2015-11-12 NOTE — Assessment & Plan Note (Signed)
Follow cbc.  

## 2015-12-06 DIAGNOSIS — J4541 Moderate persistent asthma with (acute) exacerbation: Secondary | ICD-10-CM | POA: Diagnosis not present

## 2015-12-11 DIAGNOSIS — J4541 Moderate persistent asthma with (acute) exacerbation: Secondary | ICD-10-CM | POA: Diagnosis not present

## 2015-12-13 DIAGNOSIS — J3089 Other allergic rhinitis: Secondary | ICD-10-CM | POA: Diagnosis not present

## 2015-12-13 DIAGNOSIS — J4530 Mild persistent asthma, uncomplicated: Secondary | ICD-10-CM | POA: Diagnosis not present

## 2015-12-13 DIAGNOSIS — J3081 Allergic rhinitis due to animal (cat) (dog) hair and dander: Secondary | ICD-10-CM | POA: Diagnosis not present

## 2015-12-24 ENCOUNTER — Telehealth: Payer: Self-pay | Admitting: *Deleted

## 2015-12-24 NOTE — Telephone Encounter (Signed)
Patient questioned if she could have her Xolair injection done at this office,if so the injection can be mailed to this office.  The injections are prescribed by Dr mark Power(of [facility]), in which has retired and now Dr. Soyla Dryer).She stated that the drive is becoming a bit much,however, If the injections can not be done in the office, she requested a referral by Dr. Nicki Reaper to a place that can give the injection in this area. Please advise Contact   [phone]

## 2015-12-25 NOTE — Telephone Encounter (Signed)
Pt aware & states that Dr. Donneta Romberg stated that she could get them at [facility] allergy.

## 2015-12-25 NOTE — Telephone Encounter (Signed)
I don't think we would be able to given the injection here.  I would assume that the an allergist in town would be able to give - (can check with Dr Rush Landmark office or ENT office).  Thanks

## 2016-01-09 DIAGNOSIS — J4541 Moderate persistent asthma with (acute) exacerbation: Secondary | ICD-10-CM | POA: Diagnosis not present

## 2016-02-06 DIAGNOSIS — J4541 Moderate persistent asthma with (acute) exacerbation: Secondary | ICD-10-CM | POA: Diagnosis not present

## 2016-02-18 ENCOUNTER — Telehealth: Payer: Self-pay | Admitting: Internal Medicine

## 2016-02-18 ENCOUNTER — Emergency Department
Admission: EM | Admit: 2016-02-18 | Discharge: 2016-02-18 | Disposition: A | Discharge status: Home / Self Care | Payer: PPO | Attending: Emergency Medicine | Admitting: Emergency Medicine

## 2016-02-18 ENCOUNTER — Emergency Department: Payer: PPO

## 2016-02-18 DIAGNOSIS — R42 Dizziness and giddiness: Secondary | ICD-10-CM | POA: Insufficient documentation

## 2016-02-18 DIAGNOSIS — I10 Essential (primary) hypertension: Secondary | ICD-10-CM

## 2016-02-18 DIAGNOSIS — Z888 Allergy status to other drugs, medicaments and biological substances status: Secondary | ICD-10-CM | POA: Insufficient documentation

## 2016-02-18 DIAGNOSIS — H538 Other visual disturbances: Secondary | ICD-10-CM | POA: Diagnosis not present

## 2016-02-18 DIAGNOSIS — Z882 Allergy status to sulfonamides status: Secondary | ICD-10-CM | POA: Diagnosis not present

## 2016-02-18 DIAGNOSIS — Z79899 Other long term (current) drug therapy: Secondary | ICD-10-CM | POA: Diagnosis not present

## 2016-02-18 DIAGNOSIS — J45909 Unspecified asthma, uncomplicated: Secondary | ICD-10-CM | POA: Diagnosis not present

## 2016-02-18 DIAGNOSIS — Z9049 Acquired absence of other specified parts of digestive tract: Secondary | ICD-10-CM | POA: Insufficient documentation

## 2016-02-18 LAB — CBC
HEMATOCRIT: 41.4 % (ref 35.0–47.0)
Hemoglobin: 13.7 g/dL (ref 12.0–16.0)
MCH: 28.5 pg (ref 26.0–34.0)
MCHC: 33.1 g/dL (ref 32.0–36.0)
MCV: 86.2 fL (ref 80.0–100.0)
PLATELETS: 177 10*3/uL (ref 150–440)
RBC: 4.8 MIL/uL (ref 3.80–5.20)
RDW: 13.9 % (ref 11.5–14.5)
WBC: 3.6 10*3/uL (ref 3.6–11.0)

## 2016-02-18 LAB — BASIC METABOLIC PANEL
Anion gap: 4 — ABNORMAL LOW (ref 5–15)
BUN: 10 mg/dL (ref 6–20)
CHLORIDE: 109 mmol/L (ref 101–111)
CO2: 25 mmol/L (ref 22–32)
CREATININE: 0.59 mg/dL (ref 0.44–1.00)
Calcium: 9.8 mg/dL (ref 8.9–10.3)
GFR calc Af Amer: >60 mL/min (ref >60)
GFR calc non Af Amer: >60 mL/min (ref >60)
GLUCOSE: 105 mg/dL — AB (ref 65–99)
Potassium: 3.9 mmol/L (ref 3.5–5.1)
Sodium: 138 mmol/L (ref 135–145)

## 2016-02-18 LAB — TROPONIN I: Troponin I: <0.03 ng/mL (ref <0.031)

## 2016-02-18 MED ORDER — LOSARTAN POTASSIUM 25 MG PO TABS
25.0000 mg | ORAL_TABLET | Freq: Once | ORAL | Status: AC
  Administered 2016-02-18: 25 mg via ORAL
  Filled 2016-02-18: qty 1

## 2016-02-18 NOTE — Telephone Encounter (Signed)
Patient Name: Chloe Moyer DOB: 1951/03/23 Initial Comment Caller states she got dizzy while driving 30 mins ago, had to pull over, elevated bp 174/108 Nurse Assessment Nurse: Vallery Sa, RN, Cathy Date/Time (Eastern Time): 02/18/2016 9:59:10 AM Confirm and document reason for call. If symptomatic, describe symptoms. You must click the next button to save text entered. ---Caller states she developed dizziness this morning and blood pressure was 174/108. No breathing difficulty. No injury in the past 3 days. Alert and responsive. Has the patient traveled out of the country within the last 30 days? ---No Does the patient have any new or worsening symptoms? ---Yes Will a triage be completed? ---Yes Related visit to physician within the last 2 weeks? ---No Does the PT have any chronic conditions? (i.e. diabetes, asthma, etc.) ---Yes List chronic conditions. ---High Blood Pressure (taking her meds they way MD directed), Fungal sinus infection, Is this a behavioral health or substance abuse call? ---No Guidelines Guideline Title Affirmed Question Affirmed Notes High Blood Pressure [1] BP # 160 / 100 AND [2] cardiac or neurologic symptoms (e.g., chest pain, difficulty breathing, unsteady gait, blurred vision) Final Disposition User Go to ED Now Vallery Sa, RN, [facility] - ED Disagree/Comply: Comply

## 2016-02-18 NOTE — ED Provider Notes (Signed)
[facility] Emergency Department Provider Note  ____________________________________________  Time seen: Approximately 3:40 PM  I have reviewed the triage vital signs and the nursing notes.   HISTORY  Chief Complaint Hypertension and Dizziness    HPI Chloe Moyer is a 65 y.o. female with a history of hypertension on Cozaar presenting with "dizziness" and blurred vision. Patient reports that she took all her medications as prescribed this morning, including her Cozaar. She was driving when she had acute onset of a lightheaded feeling associated with some blurred vision, "I fell like I was to pass out." She pulled over to the side of the road and called the paramedics. The entire episode lasted less than 10 minutes and has completely resolved. She has no headache, nausea or vomiting. She had no associated chest pain, palpitations, shortness of breath. She has not had any recent illness. She did know one day of ankle swelling last week which resolved.   Past Medical History  Diagnosis Date  . Asthma   . GERD (gastroesophageal reflux disease)   . Allergic rhinitis   . Diverticulosis, sigmoid   . Hypertension 06-12-2005  . Arthritis   . Ulcer     Patient Active Problem List   Diagnosis Date Noted  . Right lower quadrant pain 08/04/2015  . Abnormal liver function tests 08/04/2015  . UTI symptoms 03/31/2015  . Health care maintenance 02/03/2015  . Cough 12/21/2014  . History of colonic polyps 05/29/2014  . Leukopenia 07/07/2013  . Asthma 10/10/2012  . Allergic rhinitis 10/10/2012  . GERD (gastroesophageal reflux disease) 10/10/2012  . Hypertension 10/10/2012    Past Surgical History  Procedure Laterality Date  . Cardiac catheterization  July 2012  . Nasal sinus surgery  July 2012  . Lipoma excision  4/99    Left flank area  . Cholecystectomy  1991  . Tubal ligation  1984  . Breast cyst aspiration Left 1990    Current Outpatient Rx  Name   Route  Sig  Dispense  Refill  . albuterol (PROVENTIL HFA;VENTOLIN HFA) 108 (90 BASE) MCG/ACT inhaler      Inhale 2 puffs using inhaler every 4-6 hours as needed for SOB/wheeze.         . budesonide (PULMICORT) 0.5 MG/2ML nebulizer solution   Nebulization   Take 0.5 mg by nebulization 2 (two) times daily.         . budesonide-formoterol (SYMBICORT) 160-4.5 MCG/ACT inhaler   Inhalation   Inhale 2 puffs into the lungs daily. as directed.         Marland Kitchen EPINEPHrine 0.3 mg/0.3 mL IJ SOAJ injection   Intramuscular   Inject 0.3 mg into the muscle once.         . hydrocortisone (ANUSOL-HC) 25 MG suppository   Rectal   Place 1 suppository (25 mg total) rectally 2 (two) times daily as needed for hemorrhoids or itching.   12 suppository   3   . hydrOXYzine (ATARAX/VISTARIL) 25 MG tablet   Oral   Take 1 tablet (25 mg total) by mouth daily. Take one tablet by mouth once a day as needed.   90 tablet   1   . Hypertonic Nasal Wash (SINUS RINSE NA)      As directed three times daily          . losartan (COZAAR) 25 MG tablet   Oral   Take 1 tablet (25 mg total) by mouth daily.   90 tablet   1   .  omalizumab (XOLAIR) 150 MG injection      150 mg every 14 (fourteen) days.           Allergies Lisinopril and Septra  Family History  Problem Relation Age of Onset  . Emphysema Father     smoked  . Asthma Father   . Lung cancer Father     smoked  . Hypertension Father   . Hypertension Mother   . Breast cancer Maternal Grandmother 60  . Heart disease Paternal Grandfather     myocardial infarction  . Stroke Maternal Grandfather     Social History Social History  Substance Use Topics  . Smoking status: Never Smoker   . Smokeless tobacco: Never Used  . Alcohol Use: No     Comment: occ wine with dinner    Review of Systems Constitutional: No fever/chills. Positive lightheadedness. Positive presyncope but no syncope. Eyes: Positive blurred vision. ENT: No sore  throat. No congestion or rhinorrhea. Cardiovascular: Denies chest pain. Denies palpitations. Respiratory: Denies shortness of breath.  No cough. Gastrointestinal: No abdominal pain.  No nausea, no vomiting.  No diarrhea.  No constipation. Genitourinary: Negative for dysuria. Musculoskeletal: Negative for back pain. Skin: Negative for rash. Neurological: Negative for headaches. No focal numbness, tingling or weakness.   10-point ROS otherwise negative.  ____________________________________________   PHYSICAL EXAM:  VITAL SIGNS: ED Triage Vitals  Enc Vitals Group     BP 02/18/16 1117 180/101 mmHg     Pulse Rate 02/18/16 1117 79     Resp 02/18/16 1117 20     Temp 02/18/16 1117 98.3 F (36.8 C)     Temp Source 02/18/16 1117 Oral     SpO2 02/18/16 1117 98 %     Weight 02/18/16 1117 178 lb (80.74 kg)     Height 02/18/16 1117 5\' 5"  (1.651 m)     Head Cir --      Peak Flow --      Pain Score --      Pain Loc --      Pain Edu? --      Excl. in [place]? --     Constitutional: Alert and oriented. Well appearing and in no acute distress. Answers questions appropriately. Eyes: Conjunctivae are normal.  EOMI. No scleral icterus. PERRLA. No nystagmus. Head: Atraumatic. Nose: No congestion/rhinnorhea. Mouth/Throat: Mucous membranes are moist.  Neck: No stridor.  Supple.  No JVD. Cardiovascular: Normal rate, regular rhythm. No murmurs, rubs or gallops.  Respiratory: Normal respiratory effort.  No accessory muscle use or retractions. Lungs CTAB.  No wheezes, rales or ronchi. Gastrointestinal: Soft, nontender and nondistended.  No guarding or rebound.  No peritoneal signs. Musculoskeletal: No LE edema. No ttp in the calves or palpable cords.  Negative Homan's sign. Neurologic:  A&Ox3.  Speech is clear.  Face and smile are symmetric.  EOMI. PERRLA. Moves all extremities well. Normal gait without ataxia. Skin:  Skin is warm, dry and intact. No rash noted. Psychiatric: Mood and affect are  normal. Speech and behavior are normal.  Normal judgement.  ____________________________________________   LABS (all labs ordered are listed, but only abnormal results are displayed)  Labs Reviewed  BASIC METABOLIC PANEL - Abnormal; Notable for the following:    Glucose, Bld 105 (*)    Anion gap 4 (*)    All other components within normal limits  CBC  TROPONIN I   ____________________________________________  EKG  ED ECG REPORT I, Eula Listen, the attending physician, personally viewed and interpreted this  ECG.   Date: 02/18/2016  EKG Time: 1127  Rate: 74  Rhythm: normal sinus rhythm  Axis: Normal  Intervals:none  ST&T Change: Nonspecific T-wave inversion in V1.  ____________________________________________  RADIOLOGY  Ct Head Wo Contrast  02/18/2016  CLINICAL DATA:  Hypertension and dizziness this morning while driving, blurred vision, history hypertension, asthma EXAM: CT HEAD WITHOUT CONTRAST TECHNIQUE: Contiguous axial images were obtained from the base of the skull through the vertex without intravenous contrast. COMPARISON:  None FINDINGS: Few streak artifacts, for which repeat imaging was performed. Normal ventricular morphology. No midline shift or mass effect. Normal appearance of brain parenchyma. No intracranial hemorrhage, mass lesion, or evidence of acute infarction. No extra-axial fluid collections. Small amount of fluid or mucus in frontal sinus. Prior sinus surgery. Mastoid air cells clear. Bones unremarkable. IMPRESSION: No acute intracranial abnormalities. Electronically Signed   By: Lavonia Dana M.D.   On: 02/18/2016 16:00    ____________________________________________   PROCEDURES  Procedure(s) performed: None  Critical Care performed: No ____________________________________________   INITIAL IMPRESSION / ASSESSMENT AND PLAN / ED COURSE  Pertinent labs & imaging results that were available during my care of the patient were reviewed by  me and considered in my medical decision making (see chart for details).  65 y.o. female with a history of hypertension presenting with a self-limited episode of lightheadedness and blurred vision.  The pt did have an associated hypertension.  She did not have any other associated symptoms, and her EKG does not show any ischemic changes. She is completely symptom at This time. Her laboratory studies are reassuring. And to treat her hypertension, which on repeat was 167/105, and get a CT head to rule out any associated bleed although this is less likely.  ----------------------------------------- 5:02 PM on 02/18/2016 -----------------------------------------  Patient has remained asymptomatic and her CT scan is negative. She has a improved blood pressure which is 147/101 at this time. We will plan to discharge her home with close PMD follow-up.  ____________________________________________  FINAL CLINICAL IMPRESSION(S) / ED DIAGNOSES  Final diagnoses:  Episodic lightheadedness  Blurred vision  Essential hypertension      NEW MEDICATIONS STARTED DURING THIS VISIT:  New Prescriptions   No medications on file     Eula Listen, MD 02/18/16 1703

## 2016-02-18 NOTE — ED Notes (Signed)
E-signature box not working. Pt verbalized understanding of discharge instructions and denied questions. 

## 2016-02-18 NOTE — Telephone Encounter (Signed)
FYI, patient is currently in the ED. Thanks

## 2016-02-18 NOTE — Telephone Encounter (Signed)
Pt in ER

## 2016-02-18 NOTE — Discharge Instructions (Signed)
Please make an appointment with Dr. Nicki Reaper to discuss your chronic blood pressure medication.  Return to the emergency department for severe pain, headache, nausea or vomiting, chest pain, shortness of breath, numbness tingling or weakness, or any other symptoms concerning to you.   Blurred Vision Having blurred vision means that you cannot see things clearly. Your vision may seem fuzzy or out of focus. Blurred vision is a very common symptom of an eye or vision problem. Blurred vision is often a gradual blur that occurs in one eye or both eyes. There are many causes of blurred vision, including cataracts, macular degeneration, and diabetic retinopathy. Blurred vision can be diagnosed based on your symptoms and a physical exam. Tell your health care provider about any other health problems you have, any recent eye injury, and any prior surgeries. You may need to see a health care provider who specializes in eye problems (ophthalmologist). Your treatment depends on what is causing your blurred vision.  HOME CARE INSTRUCTIONS  Tell your health care provider about any changes in your blurred vision.  Do not drive or operate heavy machinery if your vision is blurry.  Keep all follow-up visits as directed by your health care provider. This is important. SEEK MEDICAL CARE IF:  Your symptoms get worse.  You have new symptoms.  You have trouble seeing at night.  You have trouble seeing up close or far away.  You have trouble noticing the difference between colors. SEEK IMMEDIATE MEDICAL CARE IF:  You have severe eye pain.  You have a severe headache.  You have flashing lights in your field of vision.  You have a sudden change in vision.  You have a sudden loss of vision.  You have vision change after an injury.  You notice drainage coming from your eyes.  You notice a rash around your eyes.   This information is not intended to replace advice given to you by your health care  provider. Make sure you discuss any questions you have with your health care provider.   Document Released: 11/06/2003 Document Revised: 03/20/2015 Document Reviewed: 09/27/2014 Elsevier Interactive Patient Education [employer].

## 2016-02-18 NOTE — ED Notes (Signed)
Pt arrives to ER via POV from home c/o hypertension and dizziness this AM while driving. Pt denies dizziness at this time. Pt hx of hypertension, pt complaint with medications. Pt alert and oriented X4, active, cooperative, pt in NAD. RR even and unlabored, color WNL.

## 2016-02-19 ENCOUNTER — Ambulatory Visit (INDEPENDENT_AMBULATORY_CARE_PROVIDER_SITE_OTHER): Payer: PPO | Admitting: Internal Medicine

## 2016-02-19 ENCOUNTER — Telehealth: Payer: Self-pay | Admitting: *Deleted

## 2016-02-19 ENCOUNTER — Encounter: Payer: Self-pay | Admitting: Internal Medicine

## 2016-02-19 VITALS — BP 122/80 | HR 93 | Temp 98.1°F | Resp 18 | Ht 65.25 in | Wt 181.0 lb

## 2016-02-19 DIAGNOSIS — H539 Unspecified visual disturbance: Secondary | ICD-10-CM | POA: Diagnosis not present

## 2016-02-19 DIAGNOSIS — R55 Syncope and collapse: Secondary | ICD-10-CM | POA: Diagnosis not present

## 2016-02-19 DIAGNOSIS — I10 Essential (primary) hypertension: Secondary | ICD-10-CM | POA: Diagnosis not present

## 2016-02-19 NOTE — Telephone Encounter (Signed)
If needs to be seen, I can work her in at 4:30 today.

## 2016-02-19 NOTE — Progress Notes (Signed)
Patient ID: Chloe Moyer, female   DOB: Apr 05, 1951, 65 y.o.   MRN: [id number]   Subjective:    Patient ID: Chloe Moyer, female    DOB: 20-Nov-1950, 66 y.o.   MRN: [id number]  HPI  Patient here as a work in for ER follow up.  She reports that 02/17/16 am, she noticed flashes of light in her right eye.  States was like a flashlight flickering.  Lasted approximately 30 minutes.  Yesterday while driving, she felt a sinking feeling come over her.  Felt almost like she was going to pass out.  Some blurring of her vision.  Pulled off road.  Called EMS.  Blood pressure was 146/100.  She called a friend to take her home and then proceeded to ER.  States her right eye was fuzzy.  Continues to be fuzzy.  Has not worsened, but does not seem to be back to normal.  No progression of symptoms.  In ER, CT negative as outlined.  EKG and w/up unrevealing.  They discharged her to f/u here.  She denies any chest pain or tightness.  No sob.  No acid reflux.  No abdominal pain or cramping.  Bowels stale.  Eating and drinking well.  No headache.  Increased stress recently.     Past Medical History  Diagnosis Date  . Asthma   . GERD (gastroesophageal reflux disease)   . Allergic rhinitis   . Diverticulosis, sigmoid   . Hypertension 06-12-2005  . Arthritis   . Ulcer    Past Surgical History  Procedure Laterality Date  . Cardiac catheterization  July 2012  . Nasal sinus surgery  July 2012  . Lipoma excision  4/99    Left flank area  . Cholecystectomy  1991  . Tubal ligation  1984  . Breast cyst aspiration Left 1990   Family History  Problem Relation Age of Onset  . Emphysema Father     smoked  . Asthma Father   . Lung cancer Father     smoked  . Hypertension Father   . Hypertension Mother   . Breast cancer Maternal Grandmother 60  . Heart disease Paternal Grandfather     myocardial infarction  . Stroke Maternal Grandfather    Social History   Social History  . Marital Status: Married   Spouse Name: N/A  . Number of Children: 2  . Years of Education: N/A   Occupational History  . Works at [employer]    Social History Main Topics  . Smoking status: Never Smoker   . Smokeless tobacco: Never Used  . Alcohol Use: No     Comment: occ wine with dinner  . Drug Use: No  . Sexual Activity: Not Asked   Other Topics Concern  . None   Social History Narrative    Outpatient Encounter Prescriptions as of 02/19/2016  Medication Sig  . albuterol (PROVENTIL HFA;VENTOLIN HFA) 108 (90 BASE) MCG/ACT inhaler Inhale 2 puffs using inhaler every 4-6 hours as needed for SOB/wheeze.  . budesonide (PULMICORT) 0.5 MG/2ML nebulizer solution Take 0.5 mg by nebulization 2 (two) times daily.  . budesonide-formoterol (SYMBICORT) 160-4.5 MCG/ACT inhaler Inhale 2 puffs into the lungs daily. as directed.  Marland Kitchen EPINEPHrine 0.3 mg/0.3 mL IJ SOAJ injection Inject 0.3 mg into the muscle once.  . hydrocortisone (ANUSOL-HC) 25 MG suppository Place 1 suppository (25 mg total) rectally 2 (two) times daily as needed for hemorrhoids or itching.  . hydrOXYzine (ATARAX/VISTARIL) 25 MG tablet Take 1 tablet (  25 mg total) by mouth daily. Take one tablet by mouth once a day as needed.  . Hypertonic Nasal Wash (SINUS RINSE NA) As directed three times daily   . losartan (COZAAR) 25 MG tablet Take 1 tablet (25 mg total) by mouth daily.  Marland Kitchen omalizumab (XOLAIR) 150 MG injection 150 mg every 14 (fourteen) days.   No facility-administered encounter medications on file as of 02/19/2016.    Review of Systems  Constitutional: Negative for appetite change and unexpected weight change.  HENT: Negative for congestion and sinus pressure.   Eyes: Positive for visual disturbance. Negative for pain and redness.  Respiratory: Negative for cough, chest tightness and shortness of breath.   Cardiovascular: Negative for chest pain, palpitations and leg swelling.  Gastrointestinal: Negative for nausea, vomiting, abdominal pain and  diarrhea.  Genitourinary: Negative for dysuria and difficulty urinating.  Musculoskeletal: Negative for back pain and joint swelling.  Skin: Negative for color change and rash.  Neurological: Negative for dizziness and headaches.  Psychiatric/Behavioral: Negative for dysphoric mood and agitation.       Objective:     Blood pressure rechecked by me:  136/80  Physical Exam  Constitutional: She appears well-developed and well-nourished. No distress.  HENT:  Nose: Nose normal.  Mouth/Throat: Oropharynx is clear and moist.  Eyes: Conjunctivae are normal. Right eye exhibits no discharge. Left eye exhibits no discharge.  Neck: Neck supple. No thyromegaly present.  Cardiovascular: Normal rate and regular rhythm.   Pulmonary/Chest: Breath sounds normal. No respiratory distress. She has no wheezes.  Abdominal: Soft. Bowel sounds are normal. There is no tenderness.  Musculoskeletal: She exhibits no edema or tenderness.  Lymphadenopathy:    She has no cervical adenopathy.  Neurological:  Cranial nerves appear to be intact.    Skin: No rash noted. No erythema.  Psychiatric: She has a normal mood and affect. Her behavior is normal.    BP 122/80 mmHg  Pulse 93  Temp(Src) 98.1 F (36.7 C) (Oral)  Resp 18  Ht 5' 5.25" (1.657 m)  Wt 181 lb (82.101 kg)  BMI 29.90 kg/m2  SpO2 95%  LMP 11/03/1996 Wt Readings from Last 3 Encounters:  02/19/16 181 lb (82.101 kg)  02/18/16 178 lb (80.74 kg)  11/06/15 182 lb 8 oz (82.781 kg)     Lab Results  Component Value Date   WBC 3.6 02/18/2016   HGB 13.7 02/18/2016   HCT 41.4 02/18/2016   PLT 177 02/18/2016   GLUCOSE 105* 02/18/2016   CHOL 134 07/30/2015   TRIG 64.0 07/30/2015   HDL 54.30 07/30/2015   LDLCALC 67 07/30/2015   ALT 30 11/06/2015   AST 31 11/06/2015   NA 138 02/18/2016   K 3.9 02/18/2016   CL 109 02/18/2016   CREATININE 0.59 02/18/2016   BUN 10 02/18/2016   CO2 25 02/18/2016   TSH 3.84 07/30/2015    Ct Head Wo  Contrast  02/18/2016  CLINICAL DATA:  Hypertension and dizziness this morning while driving, blurred vision, history hypertension, asthma EXAM: CT HEAD WITHOUT CONTRAST TECHNIQUE: Contiguous axial images were obtained from the base of the skull through the vertex without intravenous contrast. COMPARISON:  None FINDINGS: Few streak artifacts, for which repeat imaging was performed. Normal ventricular morphology. No midline shift or mass effect. Normal appearance of brain parenchyma. No intracranial hemorrhage, mass lesion, or evidence of acute infarction. No extra-axial fluid collections. Small amount of fluid or mucus in frontal sinus. Prior sinus surgery. Mastoid air cells clear. Bones unremarkable. IMPRESSION:  No acute intracranial abnormalities. Electronically Signed   By: Lavonia Dana M.D.   On: 02/18/2016 16:00       Assessment & Plan:   Problem List Items Addressed This Visit    Hypertension    Blood pressure elevated with the event and was noted to be 180/101 in ER.  Better now.  Hold on making any adjustments in her medication at this time.  Follow.        Near syncope    Did not pass out.  No LOC.  No confusion.  No chest pain, tightness or increased heart rate or palpitations.  EKG in ER as outlined.  Hold on further cardiac w/up.  Pursue neuro w/up as outlined.        Relevant Orders   MR Brain W Wo Contrast   US Carotid Duplex Bilateral   Visual disturbance - Primary    Initially noticed flashes of light as outlined.  Blurred/fuzzy vision with the event and the fuzziness has persisted.  No progression since the episode, but not back to normal.  Unclear etiology.  We discussed the possibility of TIA, etc.  Will have opthalmology evaluate.  Start daily aspirin.  Obtain carotid ultrasound and MRI brain.  Explained if any change in symptoms she is to be evaluated immediately.  She is in agreement with plan.        Relevant Orders   MR Brain W Wo Contrast   US Carotid Duplex Bilateral    Ambulatory referral to Ophthalmology     I spent 40 minutes with the patient and more than 50% of the time was spent in consultation regarding the above.     Einar Pheasant, MD

## 2016-02-19 NOTE — Telephone Encounter (Signed)
Spoke with the patient.  She [place] and noticed that her vision was blurry so she got off the highway and called 911.  EMS came and assessed her and her BP was high and they advised her to go to the ED.  She called for a ride and once in the ED her BP was 180/101 per the patient.  The ER doctor added a BP pill and told her to follow up with her PCP and that she may need a higher BP medication dosage.  Symptoms have not totally resolved.  She is still having intermittent blurred vision today, I asked if she was able to check her BP at home and denies having a cuff there.  No other symptoms, no CP or SOB.  Denies headache.  Please advise a place on your schedule to follow up.  Her husband is available to bring her to a appt.

## 2016-02-19 NOTE — Progress Notes (Signed)
Pre-visit discussion using our clinic review tool. No additional management support is needed unless otherwise documented below in the visit note.  

## 2016-02-19 NOTE — Telephone Encounter (Signed)
Spoke with patient she will be here at 430. Thanks

## 2016-02-19 NOTE — Telephone Encounter (Signed)
Patient stated that she will need a emergency Room follow up, she was advised to see her PCP this week. While driving yesterday she experienced blurred vision, in which  EMT was called. She had a blood pressure of 147/101. She was seen at [facility]. Please advise a place on Dr. Nicki Reaper schedule.

## 2016-02-20 ENCOUNTER — Encounter: Payer: Self-pay | Admitting: Internal Medicine

## 2016-02-20 DIAGNOSIS — H539 Unspecified visual disturbance: Secondary | ICD-10-CM | POA: Insufficient documentation

## 2016-02-20 DIAGNOSIS — R55 Syncope and collapse: Secondary | ICD-10-CM | POA: Insufficient documentation

## 2016-02-20 NOTE — Assessment & Plan Note (Signed)
Initially noticed flashes of light as outlined.  Blurred/fuzzy vision with the event and the fuzziness has persisted.  No progression since the episode, but not back to normal.  Unclear etiology.  We discussed the possibility of TIA, etc.  Will have opthalmology evaluate.  Start daily aspirin.  Obtain carotid ultrasound and MRI brain.  Explained if any change in symptoms she is to be evaluated immediately.  She is in agreement with plan.

## 2016-02-20 NOTE — Assessment & Plan Note (Signed)
Blood pressure elevated with the event and was noted to be 180/101 in ER.  Better now.  Hold on making any adjustments in her medication at this time.  Follow.

## 2016-02-20 NOTE — Assessment & Plan Note (Signed)
Did not pass out.  No LOC.  No confusion.  No chest pain, tightness or increased heart rate or palpitations.  EKG in ER as outlined.  Hold on further cardiac w/up.  Pursue neuro w/up as outlined.

## 2016-02-21 DIAGNOSIS — H43813 Vitreous degeneration, bilateral: Secondary | ICD-10-CM | POA: Diagnosis not present

## 2016-02-22 LAB — HM DIABETES EYE EXAM

## 2016-02-26 ENCOUNTER — Telehealth: Payer: Self-pay

## 2016-02-26 NOTE — Telephone Encounter (Signed)
[facility] called, patient is scheduled for a US of the Carotids and order is not correct.  It need to be a [id number].  Patient also is requesting a MRI from them? Please advise. Thanks

## 2016-02-26 NOTE — Telephone Encounter (Signed)
Called to schedule new patient appt per pcp referral.

## 2016-02-27 ENCOUNTER — Other Ambulatory Visit: Payer: Self-pay | Admitting: Internal Medicine

## 2016-02-27 DIAGNOSIS — H539 Unspecified visual disturbance: Secondary | ICD-10-CM

## 2016-02-27 DIAGNOSIS — R55 Syncope and collapse: Secondary | ICD-10-CM

## 2016-02-27 DIAGNOSIS — J4541 Moderate persistent asthma with (acute) exacerbation: Secondary | ICD-10-CM | POA: Diagnosis not present

## 2016-02-27 MED ORDER — EPINEPHRINE 0.3 MG/0.3ML IJ SOAJ: 0.3000 mg | Freq: Once | INTRAMUSCULAR | Status: AC

## 2016-02-27 NOTE — Telephone Encounter (Signed)
She does want the MRI ordered.  Thanks

## 2016-02-27 NOTE — Progress Notes (Signed)
rx sent in for epipen.

## 2016-02-27 NOTE — Telephone Encounter (Signed)
I have changed the order for the carotid ultrasound - correct order.  Regarding the MRI, pt had informed me she did not want to pursue the MRI so I told Melissa to cancel.  Has she changed her mind?  Please discuss with pt as well.  Thanks

## 2016-02-27 NOTE — Telephone Encounter (Signed)
Spoke with the patient.  She feels like her pulse is rapid and so she wants to go ahead and have the MRI.  She wanted to know if you can order something to help her with anxiety when she has the test done (1 pill).  And she would like to know if her Epi pen has been ordered to the [facility]?

## 2016-02-27 NOTE — Telephone Encounter (Signed)
Spoke with the patient again.  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  She believes that her anxiety is making her Heart rate increase at times.  The last two times have been at night where she feels like her heart is racing and then will slow done and she has been working on things for this new role. .  She declined to be acutely seen today.  I offered appt with Dr. Lacinda Axon tomorrow and she stated that she would be unable to come due to prior plans.  I have scheduled her to see Dr. Lacinda Axon on Monday.  She was advised that if her symptoms persist to call EMS or go to the hospital if needed.

## 2016-02-27 NOTE — Telephone Encounter (Signed)
I have sent in the rx for epipen.  (I did not do this after the visit).  Sorry for inconvenience.  I can schedule the MRI, but if she is having problems with rapid pulse, she needs to be evaluated.  This is new and not what was going on when I saw her.  If rapid pulse and acute symptoms, needs evaluation today.  Let me know if she wants me to reorder the MRI.

## 2016-02-27 NOTE — Telephone Encounter (Signed)
MRI is ordered.  I talked to [facility] about scheduling.  See remainder of note, if pt is having issues - about being seen.

## 2016-02-27 NOTE — Telephone Encounter (Signed)
If she is going to wait until next week to be seen (unless something changes) , I will work her in somewhere next week.  Send to [place] to hold for appt next week and I will get with her and schedule an appt.  Thanks

## 2016-02-27 NOTE — Telephone Encounter (Signed)
She has an appointment with you on Thursday for a regular follow up.  She is planning to see if she feels like it can wait she will call Monday and cancel if needed.

## 2016-02-27 NOTE — Progress Notes (Signed)
Order changed for carotid ultrasound.

## 2016-02-27 NOTE — Telephone Encounter (Signed)
Attempted to call the patient.  Left a VM to return my call.

## 2016-02-28 ENCOUNTER — Other Ambulatory Visit: Payer: Self-pay | Admitting: Internal Medicine

## 2016-02-28 DIAGNOSIS — H539 Unspecified visual disturbance: Secondary | ICD-10-CM

## 2016-02-28 DIAGNOSIS — R55 Syncope and collapse: Secondary | ICD-10-CM

## 2016-02-28 MED ORDER — LORAZEPAM 0.5 MG PO TABS: ORAL_TABLET | ORAL | Status: DC

## 2016-02-28 NOTE — Progress Notes (Signed)
Order placed for mri 

## 2016-02-28 NOTE — Progress Notes (Signed)
Order sent in for lorazepam to take prior to procedure.

## 2016-03-03 ENCOUNTER — Ambulatory Visit: Payer: PPO | Admitting: Family Medicine

## 2016-03-06 ENCOUNTER — Encounter: Payer: Self-pay | Admitting: Internal Medicine

## 2016-03-06 ENCOUNTER — Ambulatory Visit (INDEPENDENT_AMBULATORY_CARE_PROVIDER_SITE_OTHER): Payer: PPO | Admitting: Internal Medicine

## 2016-03-06 VITALS — BP 120/80 | HR 84 | Temp 97.8°F | Resp 18 | Ht 65.25 in | Wt 181.5 lb

## 2016-03-06 DIAGNOSIS — I10 Essential (primary) hypertension: Secondary | ICD-10-CM

## 2016-03-06 DIAGNOSIS — J4520 Mild intermittent asthma, uncomplicated: Secondary | ICD-10-CM | POA: Diagnosis not present

## 2016-03-06 DIAGNOSIS — H539 Unspecified visual disturbance: Secondary | ICD-10-CM | POA: Diagnosis not present

## 2016-03-06 MED ORDER — BUDESONIDE-FORMOTEROL FUMARATE 160-4.5 MCG/ACT IN AERO: 2.0000 | INHALATION_SPRAY | Freq: Every day | RESPIRATORY_TRACT | Status: AC

## 2016-03-06 NOTE — Progress Notes (Signed)
Patient ID: Chloe Moyer, female   DOB: [birth year], 65 y.o.   MRN: [id number]   Subjective:    Patient ID: Chloe Moyer, female    DOB: January 17, 1951, 65 y.o.   MRN: [id number]  HPI  Patient here for a scheduled follow up.  She was seen on 02/19/16 with vision change and near syncope.  See my note for details.  She saw opthalmology.  Found to have posterior vitreous detachment.  She had initially decided to hold on MRI after her eye appt.  She then called back and wanted rescheduled.  She still has issues with her eye.  States 30 minutes after up, the gray cloud moves in.  Remains through most of the day.  Worse at night.  Opthalmology aware of this.  Has had no further episodes like her initial episode.  She did notice some increased heart rate at night.  This occurred right after the episode.  She felt related to increased anxiety from the episodes.  Self limited.  No problem during the day.  No chest pain.  Has not had the increased heart racing recently.  Increased stress.  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  Increased stress with this.    Past Medical History  Diagnosis Date  . Asthma   . GERD (gastroesophageal reflux disease)   . Allergic rhinitis   . Diverticulosis, sigmoid   . Hypertension 06-12-2005  . Arthritis   . Ulcer    Past Surgical History  Procedure Laterality Date  . Cardiac catheterization  July 2012  . Nasal sinus surgery  July 2012  . Lipoma excision  4/99    Left flank area  . Cholecystectomy  1991  . Tubal ligation  1984  . Breast cyst aspiration Left 1990   Family History  Problem Relation Age of Onset  . Emphysema Father     smoked  . Asthma Father   . Lung cancer Father     smoked  . Hypertension Father   . Hypertension Mother   . Breast cancer Maternal Grandmother 60  . Heart disease Paternal Grandfather     myocardial infarction  . Stroke Maternal Grandfather    Social History   Social History  . Marital Status: Married    Spouse Name: N/A   . Number of Children: 2  . Years of Education: N/A   Occupational History  . Works at Environmental education officer    Social History Main Topics  . Smoking status: Never Smoker   . Smokeless tobacco: Never Used  . Alcohol Use: No     Comment: occ wine with dinner  . Drug Use: No  . Sexual Activity: Not Asked   Other Topics Concern  . None   Social History Narrative    Outpatient Encounter Prescriptions as of 03/06/2016  Medication Sig  . albuterol (PROVENTIL HFA;VENTOLIN HFA) 108 (90 BASE) MCG/ACT inhaler Inhale 2 puffs using inhaler every 4-6 hours as needed for SOB/wheeze.  . budesonide (PULMICORT) 0.5 MG/2ML nebulizer solution Take 0.5 mg by nebulization 2 (two) times daily.  . budesonide-formoterol (SYMBICORT) 160-4.5 MCG/ACT inhaler Inhale 2 puffs into the lungs daily. as directed.  Marland Kitchen EPINEPHrine (EPIPEN 2-PAK) 0.3 mg/0.3 mL IJ SOAJ injection Inject 0.3 mLs (0.3 mg total) into the muscle once.  Marland Kitchen EPINEPHrine 0.3 mg/0.3 mL IJ SOAJ injection Inject 0.3 mg into the muscle once.  . hydrocortisone (ANUSOL-HC) 25 MG suppository Place 1 suppository (25 mg total) rectally 2 (two) times daily as needed for hemorrhoids  or itching.  . hydrOXYzine (ATARAX/VISTARIL) 25 MG tablet Take 1 tablet (25 mg total) by mouth daily. Take one tablet by mouth once a day as needed.  . Hypertonic Nasal Wash (SINUS RINSE NA) As directed three times daily   . LORazepam (ATIVAN) 0.5 MG tablet Take 1/2 tablet prior to procedure.  Marland Kitchen losartan (COZAAR) 25 MG tablet Take 1 tablet (25 mg total) by mouth daily.  Marland Kitchen omalizumab (XOLAIR) 150 MG injection 150 mg every 14 (fourteen) days.  . [DISCONTINUED] budesonide-formoterol (SYMBICORT) 160-4.5 MCG/ACT inhaler Inhale 2 puffs into the lungs daily. as directed.   No facility-administered encounter medications on file as of 03/06/2016.    Review of Systems  Constitutional: Negative for appetite change and unexpected weight change.  HENT: Negative for congestion and sinus  pressure.   Eyes: Positive for visual disturbance. Negative for pain.  Respiratory: Negative for cough, chest tightness and shortness of breath.   Cardiovascular: Negative for chest pain and leg swelling.       Increased heart racing as outlined.   Gastrointestinal: Negative for nausea, vomiting, abdominal pain and diarrhea.  Genitourinary: Negative for dysuria and difficulty urinating.  Musculoskeletal: Negative for back pain and joint swelling.  Skin: Negative for color change and rash.  Neurological: Negative for dizziness, light-headedness and headaches.  Psychiatric/Behavioral: Negative for dysphoric mood and agitation.       Objective:    Physical Exam  Constitutional: She appears well-developed and well-nourished. No distress.  HENT:  Nose: Nose normal.  Mouth/Throat: Oropharynx is clear and moist.  Neck: Neck supple. No thyromegaly present.  Cardiovascular: Normal rate and regular rhythm.   Pulmonary/Chest: Breath sounds normal. No respiratory distress. She has no wheezes.  Abdominal: Soft. Bowel sounds are normal. There is no tenderness.  Musculoskeletal: She exhibits no edema or tenderness.  Lymphadenopathy:    She has no cervical adenopathy.  Skin: No rash noted. No erythema.  Psychiatric: She has a normal mood and affect. Her behavior is normal.    BP 120/80 mmHg  Pulse 84  Temp(Src) 97.8 F (36.6 C) (Oral)  Resp 18  Ht 5' 5.25" (1.657 m)  Wt 181 lb 8 oz (82.328 kg)  BMI 29.98 kg/m2  SpO2 96%  LMP 11/03/1996 Wt Readings from Last 3 Encounters:  03/06/16 181 lb 8 oz (82.328 kg)  02/19/16 181 lb (82.101 kg)  02/18/16 178 lb (80.74 kg)     Lab Results  Component Value Date   WBC 3.6 02/18/2016   HGB 13.7 02/18/2016   HCT 41.4 02/18/2016   PLT 177 02/18/2016   GLUCOSE 105* 02/18/2016   CHOL 134 07/30/2015   TRIG 64.0 07/30/2015   HDL 54.30 07/30/2015   LDLCALC 67 07/30/2015   ALT 30 11/06/2015   AST 31 11/06/2015   NA 138 02/18/2016   K 3.9  02/18/2016   CL 109 02/18/2016   CREATININE 0.59 02/18/2016   BUN 10 02/18/2016   CO2 25 02/18/2016   TSH 3.84 07/30/2015    Ct Head Wo Contrast  02/18/2016  CLINICAL DATA:  Hypertension and dizziness this morning while driving, blurred vision, history hypertension, asthma EXAM: CT HEAD WITHOUT CONTRAST TECHNIQUE: Contiguous axial images were obtained from the base of the skull through the vertex without intravenous contrast. COMPARISON:  None FINDINGS: Few streak artifacts, for which repeat imaging was performed. Normal ventricular morphology. No midline shift or mass effect. Normal appearance of brain parenchyma. No intracranial hemorrhage, mass lesion, or evidence of acute infarction. No extra-axial fluid collections.  Small amount of fluid or mucus in frontal sinus. Prior sinus surgery. Mastoid air cells clear. Bones unremarkable. IMPRESSION: No acute intracranial abnormalities. Electronically Signed   By: Lavonia Dana M.D.   On: 02/18/2016 16:00       Assessment & Plan:   Problem List Items Addressed This Visit    Asthma    Breathing stable.       Relevant Medications   budesonide-formoterol (SYMBICORT) 160-4.5 MCG/ACT inhaler   Hypertension - Primary    She brought in blood pressure readings from outside checks.  Blood pressure averaging 120-130s/70-80s.  Continue same medication regimen.  Follow pressures.  Follow metabolic panel.        Visual disturbance    Saw opthalmology.  Found to have posterior vitreous tear.  Persistent vision changes as outlined.  Present after the initial episode.  No progression.  Opthalmology aware.  Continue daily aspirin.  Will obtain MRI as outlined.  Also check carotid ultrasound.  Follow.         I spent 25 minutes with the patient and more than 50% of the time was spent in consultation regarding the above.     Einar Pheasant, MD

## 2016-03-06 NOTE — Progress Notes (Signed)
Pre-visit discussion using our clinic review tool. No additional management support is needed unless otherwise documented below in the visit note.  

## 2016-03-10 ENCOUNTER — Encounter: Payer: Self-pay | Admitting: Internal Medicine

## 2016-03-10 NOTE — Assessment & Plan Note (Signed)
She brought in blood pressure readings from outside checks.  Blood pressure averaging 120-130s/70-80s.  Continue same medication regimen.  Follow pressures.  Follow metabolic panel.

## 2016-03-10 NOTE — Assessment & Plan Note (Signed)
Breathing stable.

## 2016-03-10 NOTE — Assessment & Plan Note (Signed)
Saw opthalmology.  Found to have posterior vitreous tear.  Persistent vision changes as outlined.  Present after the initial episode.  No progression.  Opthalmology aware.  Continue daily aspirin.  Will obtain MRI as outlined.  Also check carotid ultrasound.  Follow.

## 2016-03-11 ENCOUNTER — Ambulatory Visit: Payer: PPO

## 2016-03-11 DIAGNOSIS — H539 Unspecified visual disturbance: Secondary | ICD-10-CM

## 2016-03-11 DIAGNOSIS — R55 Syncope and collapse: Secondary | ICD-10-CM

## 2016-03-18 ENCOUNTER — Ambulatory Visit: Payer: PPO | Admitting: Cardiology

## 2016-03-20 ENCOUNTER — Ambulatory Visit
Admission: RE | Admit: 2016-03-20 | Discharge: 2016-03-20 | Disposition: A | Discharge status: Home / Self Care | Payer: PPO | Source: Ambulatory Visit | Attending: Internal Medicine | Admitting: Internal Medicine

## 2016-03-20 ENCOUNTER — Ambulatory Visit: Payer: PPO

## 2016-03-20 DIAGNOSIS — G319 Degenerative disease of nervous system, unspecified: Secondary | ICD-10-CM | POA: Diagnosis not present

## 2016-03-20 DIAGNOSIS — H539 Unspecified visual disturbance: Secondary | ICD-10-CM

## 2016-03-20 DIAGNOSIS — R9082 White matter disease, unspecified: Secondary | ICD-10-CM | POA: Insufficient documentation

## 2016-03-20 DIAGNOSIS — H538 Other visual disturbances: Secondary | ICD-10-CM | POA: Diagnosis not present

## 2016-03-20 DIAGNOSIS — R55 Syncope and collapse: Secondary | ICD-10-CM | POA: Insufficient documentation

## 2016-03-20 MED ORDER — GADOBENATE DIMEGLUMINE 529 MG/ML IV SOLN
20.0000 mL | Freq: Once | INTRAVENOUS | Status: AC | PRN
  Administered 2016-03-20: 18 mL via INTRAVENOUS

## 2016-03-21 ENCOUNTER — Encounter: Payer: Self-pay | Admitting: Internal Medicine

## 2016-03-25 ENCOUNTER — Encounter: Payer: Self-pay | Admitting: Internal Medicine

## 2016-03-25 ENCOUNTER — Other Ambulatory Visit: Payer: Self-pay | Admitting: Internal Medicine

## 2016-03-26 DIAGNOSIS — J4541 Moderate persistent asthma with (acute) exacerbation: Secondary | ICD-10-CM | POA: Diagnosis not present

## 2016-04-17 DIAGNOSIS — J4541 Moderate persistent asthma with (acute) exacerbation: Secondary | ICD-10-CM | POA: Diagnosis not present

## 2016-04-22 DIAGNOSIS — Z1283 Encounter for screening for malignant neoplasm of skin: Secondary | ICD-10-CM | POA: Diagnosis not present

## 2016-04-22 DIAGNOSIS — L570 Actinic keratosis: Secondary | ICD-10-CM | POA: Diagnosis not present

## 2016-05-08 DIAGNOSIS — J4541 Moderate persistent asthma with (acute) exacerbation: Secondary | ICD-10-CM | POA: Diagnosis not present

## 2016-05-12 ENCOUNTER — Other Ambulatory Visit: Payer: Self-pay | Admitting: *Deleted

## 2016-05-12 DIAGNOSIS — Z760 Encounter for issue of repeat prescription: Secondary | ICD-10-CM

## 2016-05-12 MED ORDER — LOSARTAN POTASSIUM 25 MG PO TABS: 25.0000 mg | ORAL_TABLET | Freq: Every day | ORAL | Status: DC

## 2016-05-12 NOTE — Telephone Encounter (Signed)
ok'd rx for losartan #90 with one refill.

## 2016-05-12 NOTE — Telephone Encounter (Signed)
Last OV 03/06/16 Upcoming scheduled appointment 06/10/16 Medication pended for your approval

## 2016-05-12 NOTE — Telephone Encounter (Signed)
Patient has requested a medication refill for [facility] in [place]

## 2016-05-14 DIAGNOSIS — J4540 Moderate persistent asthma, uncomplicated: Secondary | ICD-10-CM | POA: Diagnosis not present

## 2016-05-14 DIAGNOSIS — Z79899 Other long term (current) drug therapy: Secondary | ICD-10-CM | POA: Diagnosis not present

## 2016-05-22 DIAGNOSIS — J709 Respiratory conditions due to unspecified external agent: Secondary | ICD-10-CM | POA: Diagnosis not present

## 2016-05-22 DIAGNOSIS — Z6829 Body mass index (BMI) 29.0-29.9, adult: Secondary | ICD-10-CM | POA: Diagnosis not present

## 2016-05-22 DIAGNOSIS — J329 Chronic sinusitis, unspecified: Secondary | ICD-10-CM | POA: Diagnosis not present

## 2016-05-22 DIAGNOSIS — J45909 Unspecified asthma, uncomplicated: Secondary | ICD-10-CM | POA: Diagnosis not present

## 2016-05-30 ENCOUNTER — Telehealth: Payer: Self-pay

## 2016-05-30 NOTE — Telephone Encounter (Signed)
PA for Hydroxyzine completed on cover my meds.  

## 2016-06-05 DIAGNOSIS — J4541 Moderate persistent asthma with (acute) exacerbation: Secondary | ICD-10-CM | POA: Diagnosis not present

## 2016-06-10 ENCOUNTER — Encounter: Payer: Self-pay | Admitting: Internal Medicine

## 2016-06-10 ENCOUNTER — Ambulatory Visit (INDEPENDENT_AMBULATORY_CARE_PROVIDER_SITE_OTHER): Payer: PPO | Admitting: Internal Medicine

## 2016-06-10 VITALS — BP 120/80 | HR 100 | Ht 66.0 in | Wt 183.0 lb

## 2016-06-10 DIAGNOSIS — R7989 Other specified abnormal findings of blood chemistry: Secondary | ICD-10-CM

## 2016-06-10 DIAGNOSIS — J309 Allergic rhinitis, unspecified: Secondary | ICD-10-CM | POA: Diagnosis not present

## 2016-06-10 DIAGNOSIS — I10 Essential (primary) hypertension: Secondary | ICD-10-CM

## 2016-06-10 DIAGNOSIS — H539 Unspecified visual disturbance: Secondary | ICD-10-CM

## 2016-06-10 DIAGNOSIS — D72819 Decreased white blood cell count, unspecified: Secondary | ICD-10-CM

## 2016-06-10 DIAGNOSIS — J4520 Mild intermittent asthma, uncomplicated: Secondary | ICD-10-CM

## 2016-06-10 DIAGNOSIS — R945 Abnormal results of liver function studies: Secondary | ICD-10-CM

## 2016-06-10 LAB — CBC WITH DIFFERENTIAL/PLATELET
Basophils Absolute: 0 10*3/uL (ref 0.0–0.1)
Basophils Relative: 0.4 % (ref 0.0–3.0)
EOS PCT: 2 % (ref 0.0–5.0)
Eosinophils Absolute: 0.1 10*3/uL (ref 0.0–0.7)
HEMATOCRIT: 39.6 % (ref 36.0–46.0)
Hemoglobin: 13.3 g/dL (ref 12.0–15.0)
LYMPHS ABS: 1.8 10*3/uL (ref 0.7–4.0)
Lymphocytes Relative: 26.3 % (ref 12.0–46.0)
MCHC: 33.5 g/dL (ref 30.0–36.0)
MCV: 85.5 fl (ref 78.0–100.0)
MONOS PCT: 9.9 % (ref 3.0–12.0)
Monocytes Absolute: 0.7 10*3/uL (ref 0.1–1.0)
NEUTROS PCT: 61.4 % (ref 43.0–77.0)
Neutro Abs: 4.1 10*3/uL (ref 1.4–7.7)
Platelets: 197 10*3/uL (ref 150.0–400.0)
RBC: 4.63 Mil/uL (ref 3.87–5.11)
RDW: 14.6 % (ref 11.5–15.5)
WBC: 6.7 10*3/uL (ref 4.0–10.5)

## 2016-06-10 LAB — BASIC METABOLIC PANEL
BUN: 13 mg/dL (ref 6–23)
CALCIUM: 10.2 mg/dL (ref 8.4–10.5)
CO2: 27 meq/L (ref 19–32)
Chloride: 102 mEq/L (ref 96–112)
Creatinine, Ser: 0.7 mg/dL (ref 0.40–1.20)
GFR: 88.8 mL/min (ref >60.00)
Glucose, Bld: 84 mg/dL (ref 70–99)
Potassium: 4.1 mEq/L (ref 3.5–5.1)
SODIUM: 135 meq/L (ref 135–145)

## 2016-06-10 LAB — HEPATIC FUNCTION PANEL
ALBUMIN: 4.4 g/dL (ref 3.5–5.2)
ALT: 36 U/L — AB (ref 0–35)
AST: 29 U/L (ref 0–37)
Alkaline Phosphatase: 59 U/L (ref 39–117)
Bilirubin, Direct: 0.2 mg/dL (ref 0.0–0.3)
TOTAL PROTEIN: 7.9 g/dL (ref 6.0–8.3)
Total Bilirubin: 1 mg/dL (ref 0.2–1.2)

## 2016-06-10 NOTE — Progress Notes (Signed)
Patient ID: Chloe Moyer, female   DOB: 25-Aug-1951, 64 y.o.   MRN: [id number]   Subjective:    Patient ID: Chloe Moyer, female    DOB: Oct 16, 1951, 65 y.o.   MRN: [id number]  HPI  Patient here for a scheduled follow up.  She is still noticing a "cloud"  - vision.  Saw opthalmology.  No change.  Does request f/u with Dr Franchot Mimes.  Stays active.  No chest pain.  No sob.  No acid reflux.  No abdominal pain or cramping.  Bowels stable.  Saw Dr Senior 05/22/16 - ENT.  Stable.  Recommended f/u in 6 months.  Saw Dr Maylene Roes - allergist - 05/14/16.  xolair changed to q 4 weeks.  Recently had what sounds like a virus.  Better now.  Eating.  No nausea or vomiting.     Past Medical History:  Diagnosis Date  . Allergic rhinitis   . Arthritis   . Asthma   . Diverticulosis, sigmoid   . GERD (gastroesophageal reflux disease)   . Hypertension 06-12-2005  . Ulcer    Past Surgical History:  Procedure Laterality Date  . BREAST CYST ASPIRATION Left 1990  . CARDIAC CATHETERIZATION  July 2012  . CHOLECYSTECTOMY  1991  . LIPOMA EXCISION  4/99   Left flank area  . NASAL SINUS SURGERY  July 2012  . TUBAL LIGATION  1984   Family History  Problem Relation Age of Onset  . Emphysema Father     smoked  . Asthma Father   . Lung cancer Father     smoked  . Hypertension Father   . Hypertension Mother   . Breast cancer Maternal Grandmother 60  . Heart disease Paternal Grandfather     myocardial infarction  . Stroke Maternal Grandfather    Social History   Social History  . Marital status: Married    Spouse name: N/A  . Number of children: 2  . Years of education: N/A   Occupational History  . Works at Environmental education [employer]    Social History Main Topics  . Smoking status: Never Smoker  . Smokeless tobacco: Never Used  . Alcohol use No     Comment: occ wine with dinner  . Drug use: No  . Sexual activity: Not Asked   Other Topics Concern  . None   Social History Narrative  . None     Outpatient Encounter Prescriptions as of 06/10/2016  Medication Sig  . albuterol (PROVENTIL HFA;VENTOLIN HFA) 108 (90 BASE) MCG/ACT inhaler Inhale 2 puffs using inhaler every 4-6 hours as needed for SOB/wheeze.  . budesonide (PULMICORT) 0.5 MG/2ML nebulizer solution Take 0.5 mg by nebulization 2 (two) times daily.  . budesonide-formoterol (SYMBICORT) 160-4.5 MCG/ACT inhaler Inhale 2 puffs into the lungs daily. as directed.  Marland Kitchen EPINEPHrine (EPIPEN 2-PAK) 0.3 mg/0.3 mL IJ SOAJ injection Inject 0.3 mLs (0.3 mg total) into the muscle once.  Marland Kitchen EPINEPHrine 0.3 mg/0.3 mL IJ SOAJ injection Inject 0.3 mg into the muscle once.  . hydrocortisone (ANUSOL-HC) 25 MG suppository Place 1 suppository (25 mg total) rectally 2 (two) times daily as needed for hemorrhoids or itching.  . hydrOXYzine (ATARAX/VISTARIL) 25 MG tablet Take 1 tablet (25 mg total) by mouth daily. Take one tablet by mouth once a day as needed.  . Hypertonic Nasal Wash (SINUS RINSE NA) As directed three times daily   . LORazepam (ATIVAN) 0.5 MG tablet Take 1/2 tablet prior to procedure.  Marland Kitchen losartan (COZAAR) 25 MG tablet Take  1 tablet (25 mg total) by mouth daily.  Marland Kitchen omalizumab (XOLAIR) 150 MG injection 150 mg every 14 (fourteen) days.   No facility-administered encounter medications on file as of 06/10/2016.     Review of Systems  Constitutional: Negative for appetite change and unexpected weight change.  HENT: Positive for congestion. Negative for sinus pressure.   Eyes: Positive for visual disturbance.  Respiratory: Negative for cough, chest tightness and shortness of breath.   Cardiovascular: Negative for chest pain, palpitations and leg swelling.  Gastrointestinal: Negative for abdominal pain, diarrhea, nausea and vomiting.  Genitourinary: Negative for difficulty urinating and dysuria.  Musculoskeletal: Negative for back pain and joint swelling.  Skin: Negative for color change and rash.  Neurological: Negative for dizziness,  light-headedness and headaches.  Psychiatric/Behavioral: Negative for agitation and dysphoric mood.       Objective:    Physical Exam  Constitutional: She appears well-developed and well-nourished. No distress.  HENT:  Nose: Nose normal.  Mouth/Throat: Oropharynx is clear and moist.  Neck: Neck supple. No thyromegaly present.  Cardiovascular: Normal rate and regular rhythm.   Pulmonary/Chest: Breath sounds normal. No respiratory distress. She has no wheezes.  Abdominal: Soft. Bowel sounds are normal. There is no tenderness.  Musculoskeletal: She exhibits no edema or tenderness.  Lymphadenopathy:    She has no cervical adenopathy.  Skin: No rash noted. No erythema.  Psychiatric: She has a normal mood and affect. Her behavior is normal.    BP 120/80   Pulse 100   Ht 5\' 6"  (1.676 m)   Wt 183 lb (83 kg)   LMP 11/03/1996   SpO2 97%   BMI 29.54 kg/m  Wt Readings from Last 3 Encounters:  06/10/16 183 lb (83 kg)  03/06/16 181 lb 8 oz (82.3 kg)  02/19/16 181 lb (82.1 kg)     Lab Results  Component Value Date   WBC 6.7 06/10/2016   HGB 13.3 06/10/2016   HCT 39.6 06/10/2016   PLT 197.0 06/10/2016   GLUCOSE 84 06/10/2016   CHOL 134 07/30/2015   TRIG 64.0 07/30/2015   HDL 54.30 07/30/2015   LDLCALC 67 07/30/2015   ALT 36 (H) 06/10/2016   AST 29 06/10/2016   NA 135 06/10/2016   K 4.1 06/10/2016   CL 102 06/10/2016   CREATININE 0.70 06/10/2016   BUN 13 06/10/2016   CO2 27 06/10/2016   TSH 3.84 07/30/2015    Mr Brain W Wo Contrast  Result Date: 03/20/2016 CLINICAL DATA:  Blurred vision RIGHT eye. Global headaches for 1 month. Acute visual disturbance. Near syncope. EXAM: MRI HEAD WITHOUT AND WITH CONTRAST TECHNIQUE: Multiplanar, multiecho pulse sequences of the brain and surrounding structures were obtained without and with intravenous contrast. CONTRAST:  80mL MULTIHANCE GADOBENATE DIMEGLUMINE 529 MG/ML IV SOLN COMPARISON:  CT head 02/18/2016. FINDINGS: The patient was  unable to remain motionless for the exam. Small or subtle lesions could be overlooked. No evidence for acute infarction, hemorrhage, mass lesion, hydrocephalus, or extra-axial fluid. Mild atrophy. Minimal white matter disease, nonspecific. Flow voids are maintained throughout the carotid, basilar, and vertebral arteries. There are no areas of chronic hemorrhage. Pituitary, pineal, and cerebellar tonsils unremarkable. No upper cervical lesions. Cervical spondylosis is noted at C4-C5. Post infusion, no abnormal enhancement of the brain or meninges. Incidental venous angioma LEFT frontal lobe. Visualized calvarium, skull base, and upper cervical osseous structures unremarkable. Scalp and extracranial soft tissues, orbits, sinuses, and mastoids show no acute process. Chronic sinus disease status post maxillary antrectomies  with slight mucosal thickening. Within limits for visualization on this motion degraded non dedicated exam, symmetric globes and orbital contents. Compared with prior CT, good general agreement. IMPRESSION: Motion degraded exam. Suboptimal evaluation of the brain and orbits particularly. No acute intracranial findings. No abnormal postcontrast enhancement. Mild atrophy.  Minor nonspecific white matter disease. Electronically Signed   By: Staci Righter M.D.   On: 03/20/2016 13:42       Assessment & Plan:   Problem List Items Addressed This Visit    Abnormal liver function tests    CT 07/2015 revealed changes c/w fatty liver.  Diet and exercise.  Recheck liver panel today.       Relevant Orders   Hepatic function panel (Completed)   Allergic rhinitis    Followed by ENT and her allergist.  Continue current regimen.  See notes.        Asthma    Breathing stable.  Continue current regimen.        Relevant Orders   CBC with Differential/Platelet (Completed)   Hypertension - Primary    Blood pressure under good control.  Continue same medication regimen.  Follow pressures.  Follow  metabolic panel.        Relevant Orders   Basic metabolic panel (Completed)   Leukopenia    Recheck cbc today.       Visual disturbance    Saw opthalmology.  Found to have posterior vitreous tear.  Had MRI as outlined.  Wants f/u with Dr Dawna Part at his new office.        Relevant Orders   Ambulatory referral to Ophthalmology    Other Visit Diagnoses   None.      Einar Pheasant, MD

## 2016-06-11 ENCOUNTER — Encounter: Payer: Self-pay | Admitting: Internal Medicine

## 2016-06-14 ENCOUNTER — Encounter: Payer: Self-pay | Admitting: Internal Medicine

## 2016-06-14 NOTE — Assessment & Plan Note (Signed)
Saw opthalmology.  Found to have posterior vitreous tear.  Had MRI as outlined.  Wants f/u with Dr Dawna Part at his new office.

## 2016-06-14 NOTE — Assessment & Plan Note (Signed)
Blood pressure under good control.  Continue same medication regimen.  Follow pressures.  Follow metabolic panel.   

## 2016-06-14 NOTE — Assessment & Plan Note (Signed)
CT 07/2015 revealed changes c/w fatty liver.  Diet and exercise.  Recheck liver panel today.

## 2016-06-14 NOTE — Assessment & Plan Note (Signed)
Followed by ENT and her allergist.  Continue current regimen.  See notes.

## 2016-06-14 NOTE — Assessment & Plan Note (Signed)
Recheck cbc today.   

## 2016-06-14 NOTE — Assessment & Plan Note (Signed)
Breathing stable.  Continue current regimen.   

## 2016-07-02 DIAGNOSIS — H2513 Age-related nuclear cataract, bilateral: Secondary | ICD-10-CM | POA: Diagnosis not present

## 2016-07-02 DIAGNOSIS — H43813 Vitreous degeneration, bilateral: Secondary | ICD-10-CM | POA: Diagnosis not present

## 2016-07-02 DIAGNOSIS — H04123 Dry eye syndrome of bilateral lacrimal glands: Secondary | ICD-10-CM | POA: Diagnosis not present

## 2016-07-03 DIAGNOSIS — J4541 Moderate persistent asthma with (acute) exacerbation: Secondary | ICD-10-CM | POA: Diagnosis not present

## 2016-07-09 ENCOUNTER — Other Ambulatory Visit: Payer: Self-pay | Admitting: Internal Medicine

## 2016-07-09 DIAGNOSIS — Z1231 Encounter for screening mammogram for malignant neoplasm of breast: Secondary | ICD-10-CM

## 2016-07-15 ENCOUNTER — Ambulatory Visit (INDEPENDENT_AMBULATORY_CARE_PROVIDER_SITE_OTHER): Payer: PPO

## 2016-07-15 VITALS — BP 128/72 | HR 78 | Temp 97.4°F | Resp 14 | Ht 65.5 in | Wt 184.8 lb

## 2016-07-15 DIAGNOSIS — Z0000 Encounter for general adult medical examination without abnormal findings: Secondary | ICD-10-CM

## 2016-07-15 DIAGNOSIS — Z23 Encounter for immunization: Secondary | ICD-10-CM

## 2016-07-15 NOTE — Progress Notes (Signed)
Subjective:   Chloe Moyer is a 65 y.o. female who presents for Medicare Annual (Subsequent) preventive examination.  Review of Systems:  No ROS.  Medicare Wellness Visit.  Cardiac Risk Factors include: advanced age (>4men, >78 women);hypertension     Objective:     Vitals: BP 128/72 (BP Location: Left Arm, Patient Position: Sitting, Cuff Size: Normal)   Pulse 78   Temp 97.4 F (36.3 C) (Oral)   Resp 14   Ht 5' 5.5" (1.664 m)   Wt 184 lb 12.8 oz (83.8 kg)   LMP 11/03/1996   SpO2 98%   BMI 30.28 kg/m   Body mass index is 30.28 kg/m.   Tobacco History  Smoking Status  . Never Smoker  Smokeless Tobacco  . Never Used     Counseling given: No   Past Medical History:  Diagnosis Date  . Allergic rhinitis   . Arthritis   . Asthma   . Diverticulosis, sigmoid   . GERD (gastroesophageal reflux disease)   . Hypertension 06-12-2005  . Ulcer    Past Surgical History:  Procedure Laterality Date  . BREAST CYST ASPIRATION Left 1990  . CARDIAC CATHETERIZATION  July 2012  . CHOLECYSTECTOMY  1991  . LIPOMA EXCISION  4/99   Left flank area  . NASAL SINUS SURGERY  July 2012  . TUBAL LIGATION  1984   Family History  Problem Relation Age of Onset  . Emphysema Father     smoked  . Asthma Father   . Lung cancer Father     smoked  . Hypertension Father   . Hypertension Mother   . Breast cancer Maternal Grandmother 60  . Heart disease Paternal Grandfather     myocardial infarction  . Stroke Maternal Grandfather    History  Sexual Activity  . Sexual activity: Yes    Outpatient Encounter Prescriptions as of 07/15/2016  Medication Sig  . albuterol (PROVENTIL HFA;VENTOLIN HFA) 108 (90 BASE) MCG/ACT inhaler Inhale 2 puffs using inhaler every 4-6 hours as needed for SOB/wheeze.  . budesonide (PULMICORT) 0.5 MG/2ML nebulizer solution Take 0.5 mg by nebulization 2 (two) times daily.  . budesonide-formoterol (SYMBICORT) 160-4.5 MCG/ACT inhaler Inhale 2 puffs into the  lungs daily. as directed.  Marland Kitchen EPINEPHrine (EPIPEN 2-PAK) 0.3 mg/0.3 mL IJ SOAJ injection Inject 0.3 mLs (0.3 mg total) into the muscle once.  Marland Kitchen EPINEPHrine 0.3 mg/0.3 mL IJ SOAJ injection Inject 0.3 mg into the muscle once.  . hydrocortisone (ANUSOL-HC) 25 MG suppository Place 1 suppository (25 mg total) rectally 2 (two) times daily as needed for hemorrhoids or itching.  . hydrOXYzine (ATARAX/VISTARIL) 25 MG tablet Take 1 tablet (25 mg total) by mouth daily. Take one tablet by mouth once a day as needed.  . Hypertonic Nasal Wash (SINUS RINSE NA) As directed three times daily   . losartan (COZAAR) 25 MG tablet Take 1 tablet (25 mg total) by mouth daily.  Marland Kitchen omalizumab (XOLAIR) 150 MG injection 150 mg every 30 (thirty) days.   . [DISCONTINUED] LORazepam (ATIVAN) 0.5 MG tablet Take 1/2 tablet prior to procedure.   No facility-administered encounter medications on file as of 07/15/2016.     Activities of Daily Living In your present state of health, do you have any difficulty performing the following activities: 07/15/2016  Hearing? N  Vision? N  Difficulty concentrating or making decisions? N  Walking or climbing stairs? Y  Dressing or bathing? N  Doing errands, shopping? N  Preparing Food and eating ? N  Using the Toilet? N  In the past six months, have you accidently leaked urine? N  Do you have problems with loss of bowel control? N  Managing your Medications? N  Managing your Finances? N  Housekeeping or managing your Housekeeping? N  Some recent data might be hidden    Patient Care Team: Einar Pheasant, MD as PCP - General (Internal Medicine) Beverly Gust, MD as Referring Physician (Unknown Physician Specialty) Laurena Spies, MD as Referring Physician (Pulmonary Disease) Michel Santee Senior, MD as Referring Physician (Otolaryngology)    Assessment:    This is a routine wellness examination for Chloe Moyer. The goal of the wellness visit is to assist the patient how to close the  gaps in care and create a preventative care plan for the patient.   Taking calcium VIT D as appropriate/Osteoporosis risk reviewed.  Medications reviewed; taking without issues or barriers.  Safety issues reviewed; smoke detectors in the home. Firearms locked in a safe within the home.  Wears seatbelts when driving or riding with others. No violence in the home.  No identified risk were noted; The patient was oriented x 3; appropriate in dress and manner and no objective failures at ADL's or IADL's.   Body mass index; discussed the importance of a healthy diet, water intake and exercise. Educational material provided.  Pneumococcal vaccine administered, L deltoid.  Tolerated well.  Health maintenance gaps; closed.  Patient Concerns: None at this time. Follow up with PCP as needed.  Exercise Activities and Dietary recommendations Current Exercise Habits: Home exercise routine, Type of exercise: walking, Time (Minutes): 30, Frequency (Times/Week): 2, Weekly Exercise (Minutes/Week): 60, Intensity: Mild  Goals    . Healthy Lifestyle          STAY HYDRATED AND DRINK PLENTY OF WATER STAY ACTIVE AND CONTINUE WALKING AND SWIMMING LOW CARB FOODS. LEAN MEATS, VEGETABLES.      Fall Risk Fall Risk  07/15/2016 03/21/2015 10/10/2012  Falls in the past year? No No No   Depression Screen PHQ 2/9 Scores 07/15/2016 03/21/2015 10/10/2012  PHQ - 2 Score 0 0 0     Cognitive Testing MMSE - Mini Mental State Exam 07/15/2016 03/21/2015  Orientation to time 5 5  Orientation to Place 5 5  Registration 3 3  Attention/ Calculation 5 5  Recall 3 3  Language- name 2 objects 2 2  Language- repeat 1 1  Language- follow 3 step command 3 3  Language- read & follow direction 1 1  Write a sentence 1 1  Copy design 1 1  Total score 30 30    Immunization History  Administered Date(s) Administered  . Influenza Split 09/27/2012, 09/27/2012  . Influenza,inj,Quad PF,36+ Mos 07/28/2014, 08/21/2015  .  Influenza-Unspecified 07/08/2016  . Pneumococcal Conjugate-13 03/22/2015  . Pneumococcal Polysaccharide-23 07/15/2016  . Tdap 04/28/2013  . Zoster 02/23/2014   Screening Tests Health Maintenance  Topic Date Due  . MAMMOGRAM  08/07/2017  . COLONOSCOPY  05/13/2019  . TETANUS/TDAP  04/29/2023  . INFLUENZA VACCINE  Addressed  . DEXA SCAN  Completed  . ZOSTAVAX  Completed  . Hepatitis C Screening  Completed  . PNA vac Low Risk Adult  Completed      Plan:    End of life planning; Advance aging; Advanced directives discussed. No HCPOA/Living Will.  Educational material declined at this time.  Follow up with PCP.  During the course of the visit the patient was educated and counseled about the following appropriate screening and preventive services:  Vaccines to include Pneumoccal, Influenza, Hepatitis B, Td, Zostavax, HCV  Electrocardiogram  Cardiovascular Disease  Colorectal cancer screening  Bone density screening  Diabetes screening  Glaucoma screening  Mammography/PAP  Nutrition counseling   Patient Instructions (the written plan) was given to the patient.   Varney Biles, LPN  [id number]   Reviewed above.  Agree with assessment and plan.    Dr Nicki Reaper

## 2016-07-15 NOTE — Patient Instructions (Addendum)
  Ms. Kady , Thank you for taking time to come for your Medicare Wellness Visit. I appreciate your ongoing commitment to your health goals. Please review the following plan we discussed and let me know if I can assist you in the future.   These are the goals we discussed: Goals    . Healthy Lifestyle          STAY HYDRATED AND DRINK PLENTY OF WATER STAY ACTIVE AND CONTINUE WALKING AND SWIMMING LOW CARB FOODS. LEAN MEATS, VEGETABLES.       This is a list of the screening recommended for you and due dates:  Health Maintenance  Topic Date Due  . Mammogram  08/07/2017  . Colon Cancer Screening  05/13/2019  . Tetanus Vaccine  04/29/2023  . Flu Shot  Addressed  . DEXA scan (bone density measurement)  Completed  . Shingles Vaccine  Completed  .  Hepatitis C: One time screening is recommended by Center for Disease Control  (CDC) for  adults born from [birth year] through 1967.   Completed  . Pneumonia vaccines  Completed

## 2016-07-21 ENCOUNTER — Other Ambulatory Visit: Payer: Self-pay | Admitting: Internal Medicine

## 2016-07-21 DIAGNOSIS — R945 Abnormal results of liver function studies: Secondary | ICD-10-CM

## 2016-07-21 DIAGNOSIS — I10 Essential (primary) hypertension: Secondary | ICD-10-CM

## 2016-07-21 DIAGNOSIS — R7989 Other specified abnormal findings of blood chemistry: Secondary | ICD-10-CM

## 2016-07-31 DIAGNOSIS — J4541 Moderate persistent asthma with (acute) exacerbation: Secondary | ICD-10-CM | POA: Diagnosis not present

## 2016-08-01 IMAGING — MR MR HEAD WO/W CM
10 series · 43 of 48 positions shown · IV contrast (18mL MULTIHANCE)
Comparison: CT head 02/18/2016.

CLINICAL DATA: Blurred vision RIGHT eye. Global headaches for 1
month. Acute visual disturbance. Near syncope.

EXAM:
MRI HEAD WITHOUT AND WITH CONTRAST
TECHNIQUE: Multiplanar, multiecho pulse sequences of the brain and surrounding
structures were obtained without and with intravenous contrast.
CONTRAST:  18mL MULTIHANCE GADOBENATE DIMEGLUMINE 529 MG/ML IV SOLN

[Series 2: T1 · sagittal · 5.0mm · 0.45mm/px · 4 of 27 slices shown (1 of 2)]
[im 1/27]
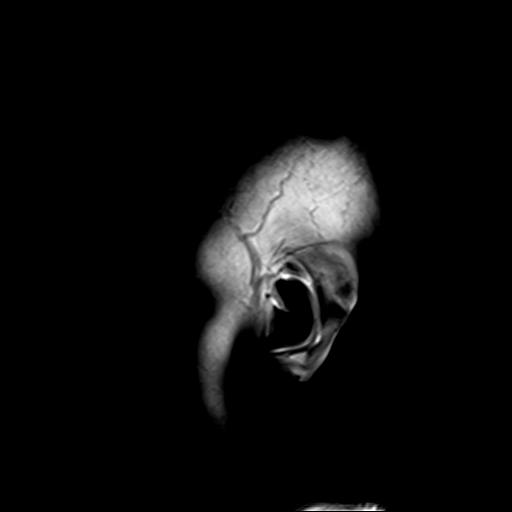
[im 9/27]
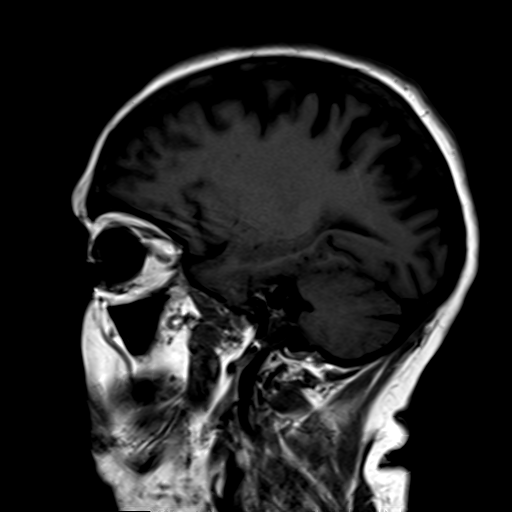
[im 18/27]
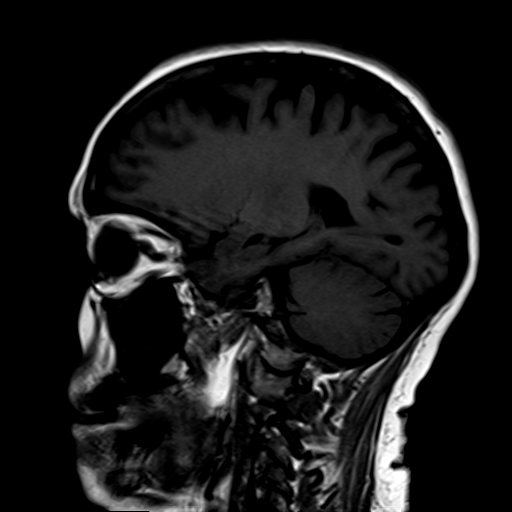
[im 27/27]
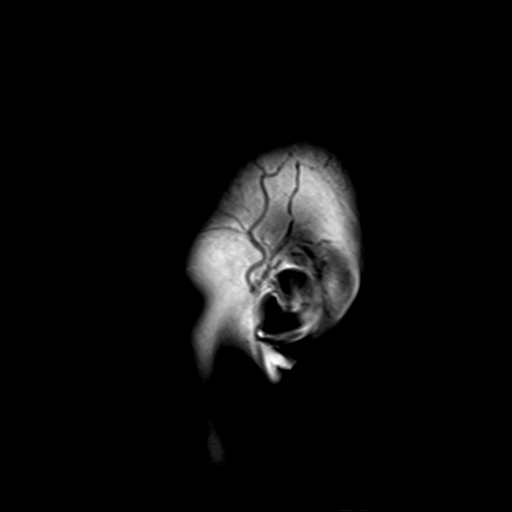

[Series 4: DWI · axial · 3.0mm · 1.80mm/px · z∈[-59,+101]mm · 8 of 55 slices shown (1 of 2)]
[im 1/55]
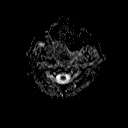
[im 8/55]
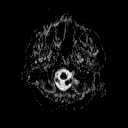
[im 16/55]
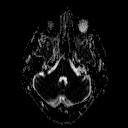
[im 24/55]
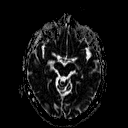
[im 31/55]
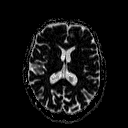
[im 39/55]
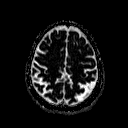
[im 47/55]
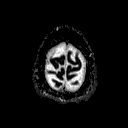
[im 55/55]
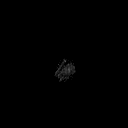

[Series 5: T2 · axial · 5.0mm · 0.60mm/px · z∈[-58,+97]mm · 3 of 25 slices shown (1 of 2)]
[im 1/25]
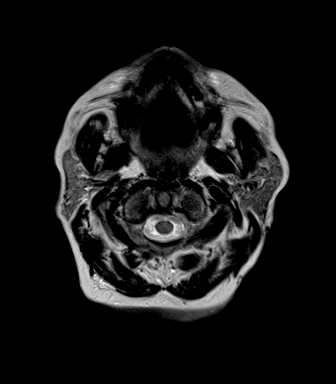
[im 13/25]
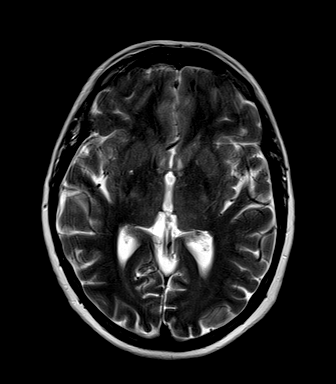
[im 25/25]
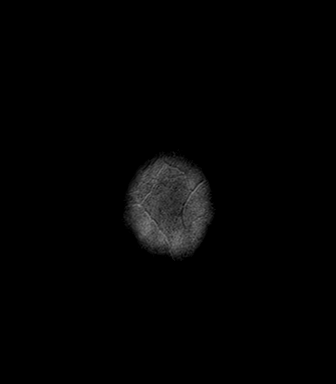

[Series 7: FLAIR · axial · 5.0mm · 0.45mm/px · z∈[-58,+97]mm · 3 of 25 slices shown]
[im 1/25]
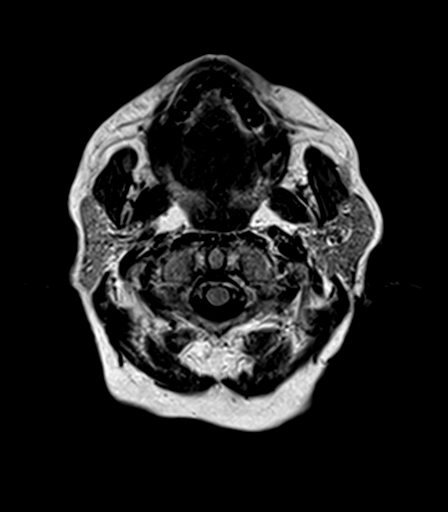
[im 13/25]
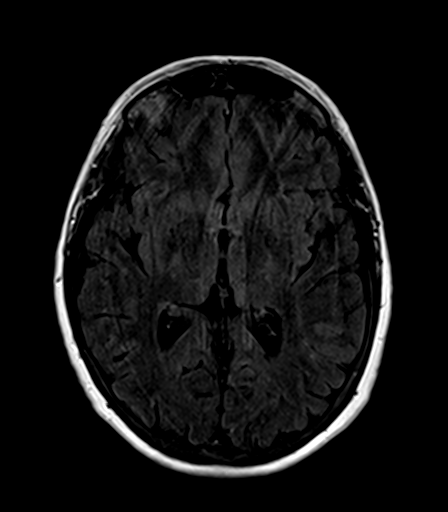
[im 25/25]
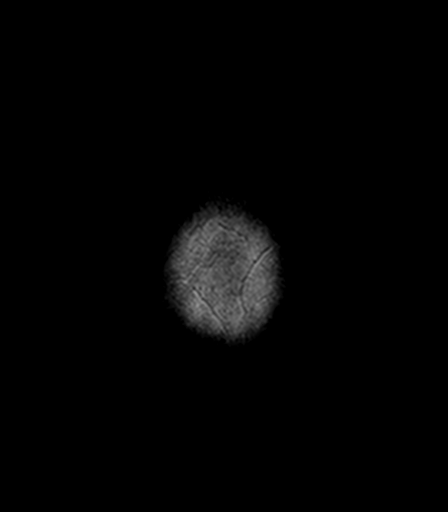

[Series 8: T2 · axial · 5.0mm · 0.45mm/px · z∈[-58,+97]mm · 3 of 25 slices shown (2 of 2)]
[im 1/25]
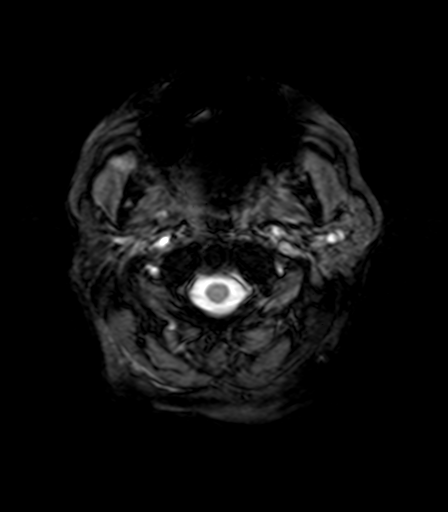
[im 13/25]
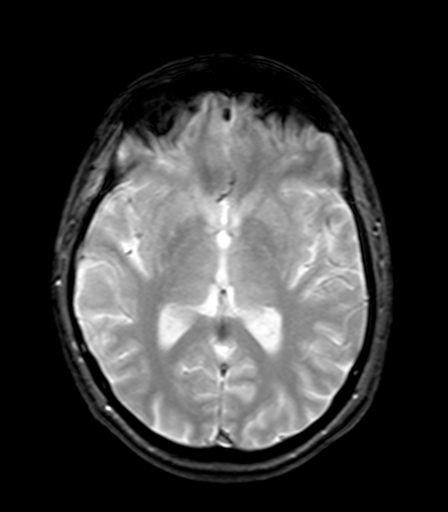
[im 25/25]
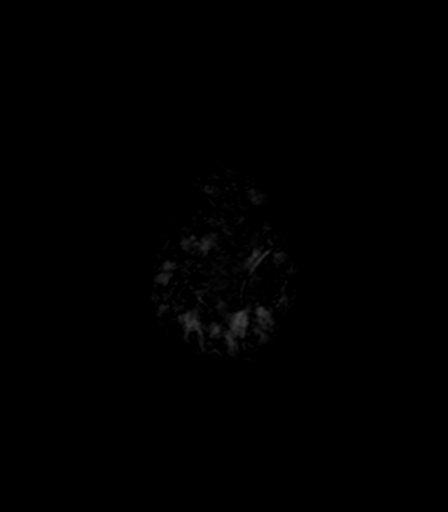

[Series 9: T1 · axial · 3.0mm · 1.00mm/px · z∈[-60,-33]mm · 2 of 56 slices shown (2 of 2)]
[im 1/56]
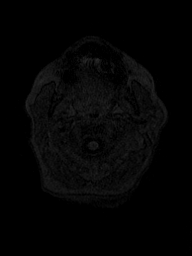
[im 10/56]
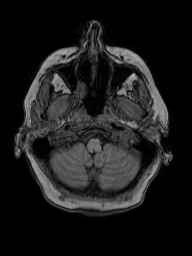

[Series 10: T2 post-contrast · coronal · 5.0mm · 0.49mm/px · 3 of 27 slices shown]
[im 1/27]
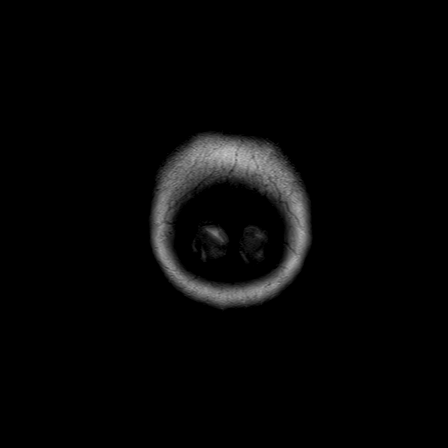
[im 14/27]
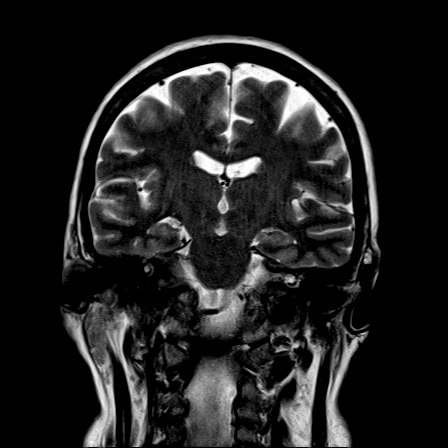
[im 27/27]
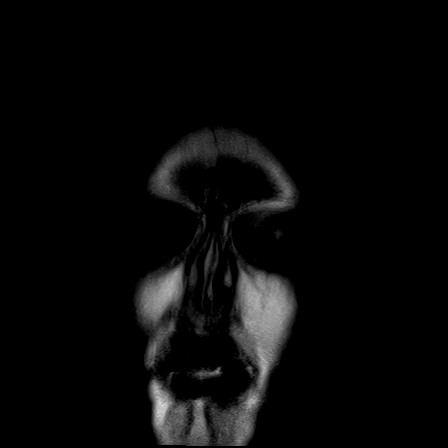

[Series 11: T1 post-contrast · axial · 3.0mm · 1.00mm/px · z∈[-60,+104]mm · 7 of 56 slices shown (1 of 2)]
[im 1/56]
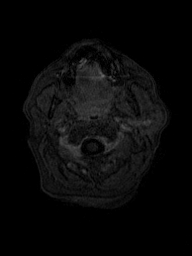
[im 10/56]
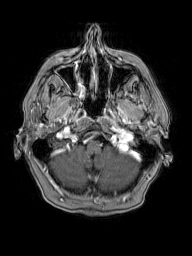
[im 19/56]
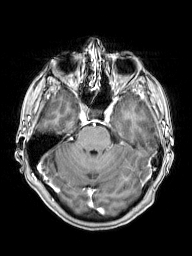
[im 28/56]
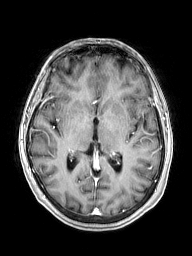
[im 37/56]
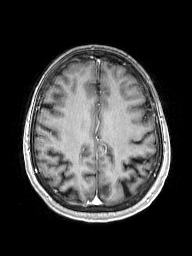
[im 46/56]
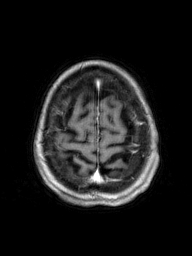
[im 56/56]
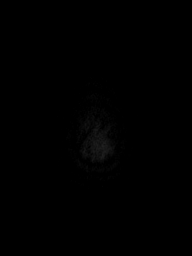

[Series 12: T1 post-contrast · coronal · 5.0mm · 0.43mm/px · 3 of 27 slices shown (2 of 2)]
[im 1/27]
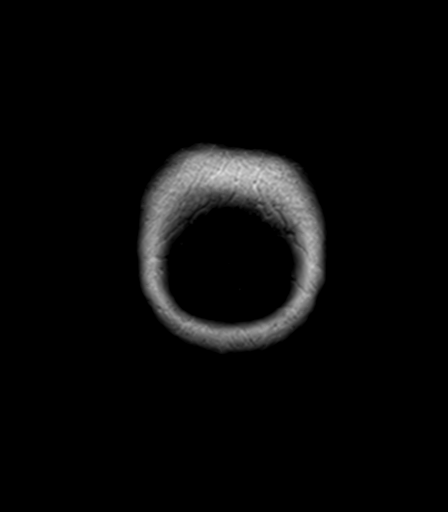
[im 14/27]
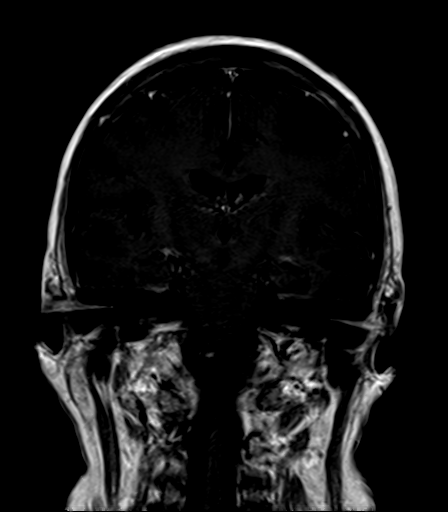
[im 27/27]
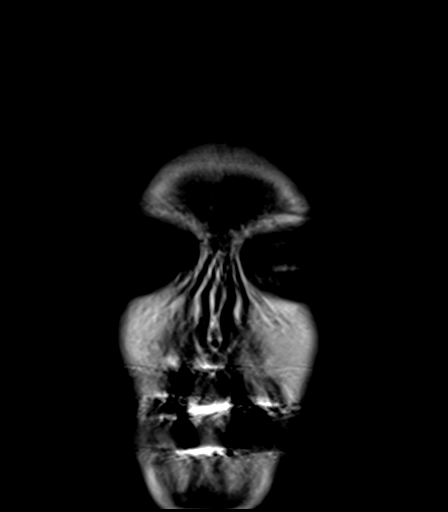

[Series 100: DWI · axial · 3.0mm · 1.80mm/px · z∈[-53,+101]mm · 7 of 53 slices shown (2 of 2)]
[im 1/53]
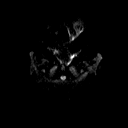
[im 9/53]
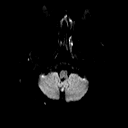
[im 18/53]
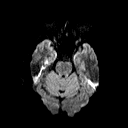
[im 27/53]
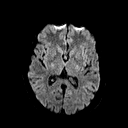
[im 35/53]
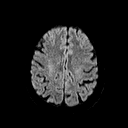
[im 44/53]
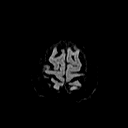
[im 53/53]
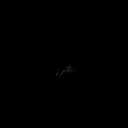

[43 of 48 positions shown; findings below may reference images not displayed]

FINDINGS: The patient was unable to remain motionless for the exam. Small or
subtle lesions could be overlooked.

No evidence for acute infarction, hemorrhage, mass lesion,
hydrocephalus, or extra-axial fluid. Mild atrophy. Minimal white
matter disease, nonspecific.

Flow voids are maintained throughout the carotid, basilar, and
vertebral arteries. There are no areas of chronic hemorrhage.

Pituitary, pineal, and cerebellar tonsils unremarkable. No upper
cervical lesions. Cervical spondylosis is noted at C4-C5.

Post infusion, no abnormal enhancement of the brain or meninges.
Incidental venous angioma LEFT frontal lobe.

Visualized calvarium, skull base, and upper cervical osseous
structures unremarkable. Scalp and extracranial soft tissues,
orbits, sinuses, and mastoids show no acute process. Chronic sinus
disease status post maxillary antrectomies with slight mucosal
thickening.

Within limits for visualization on this motion degraded non
dedicated exam, symmetric globes and orbital contents.

Compared with prior CT, good general agreement.
IMPRESSION: Motion degraded exam. Suboptimal evaluation of the brain and orbits
particularly.

No acute intracranial findings.

No abnormal postcontrast enhancement.

Mild atrophy.  Minor nonspecific white matter disease.

## 2016-08-13 ENCOUNTER — Ambulatory Visit
Admission: RE | Admit: 2016-08-13 | Discharge: 2016-08-13 | Disposition: A | Discharge status: Home / Self Care | Payer: PPO | Source: Ambulatory Visit | Attending: Internal Medicine | Admitting: Internal Medicine

## 2016-08-13 ENCOUNTER — Other Ambulatory Visit: Payer: Self-pay | Admitting: Internal Medicine

## 2016-08-13 DIAGNOSIS — Z1231 Encounter for screening mammogram for malignant neoplasm of breast: Secondary | ICD-10-CM | POA: Diagnosis not present

## 2016-08-26 DIAGNOSIS — J4541 Moderate persistent asthma with (acute) exacerbation: Secondary | ICD-10-CM | POA: Diagnosis not present

## 2016-09-11 ENCOUNTER — Encounter: Payer: Self-pay | Admitting: Internal Medicine

## 2016-09-11 ENCOUNTER — Ambulatory Visit (INDEPENDENT_AMBULATORY_CARE_PROVIDER_SITE_OTHER): Payer: PPO | Admitting: Internal Medicine

## 2016-09-11 DIAGNOSIS — D72819 Decreased white blood cell count, unspecified: Secondary | ICD-10-CM | POA: Diagnosis not present

## 2016-09-11 DIAGNOSIS — H539 Unspecified visual disturbance: Secondary | ICD-10-CM | POA: Diagnosis not present

## 2016-09-11 DIAGNOSIS — I10 Essential (primary) hypertension: Secondary | ICD-10-CM

## 2016-09-11 DIAGNOSIS — Z860100 Personal history of colon polyps, unspecified: Secondary | ICD-10-CM

## 2016-09-11 DIAGNOSIS — R945 Abnormal results of liver function studies: Secondary | ICD-10-CM

## 2016-09-11 DIAGNOSIS — R7989 Other specified abnormal findings of blood chemistry: Secondary | ICD-10-CM

## 2016-09-11 DIAGNOSIS — J4520 Mild intermittent asthma, uncomplicated: Secondary | ICD-10-CM

## 2016-09-11 DIAGNOSIS — Z0000 Encounter for general adult medical examination without abnormal findings: Secondary | ICD-10-CM

## 2016-09-11 NOTE — Progress Notes (Signed)
Pre visit review using our clinic review tool, if applicable. No additional management support is needed unless otherwise documented below in the visit note. 

## 2016-09-11 NOTE — Progress Notes (Signed)
Patient ID: Chloe Moyer, female   DOB: 06-10-1951, 65 y.o.   MRN: [id number]   Subjective:    Patient ID: Chloe Moyer, female    DOB: 1951-09-03, 65 y.o.   MRN: [id number]  HPI  Patient with past history of allergies, asthma, GERD and hypertension.  She comes in today to follow up on these issues as well as for a complete physical exam.   She feels she is doing well.  Feels good.  Allergy symptoms controlled.  Breathing stable.  No chest pain.  No sob.  No acid reflux.  No abdominal pain or cramping.  Bowels stable.  Some mid low back pain.  Noticed after working in yard.  No pain radiating down leg.  No numbness or tingling.  No persistent pain.  Overall feels good.     Past Medical History:  Diagnosis Date  . Allergic rhinitis   . Arthritis   . Asthma   . Diverticulosis, sigmoid   . GERD (gastroesophageal reflux disease)   . Hypertension 06-12-2005  . Ulcer [facility])    Past Surgical History:  Procedure Laterality Date  . BREAST CYST ASPIRATION Left 1990  . CARDIAC CATHETERIZATION  July 2012  . CHOLECYSTECTOMY  1991  . LIPOMA EXCISION  4/99   Left flank area  . NASAL SINUS SURGERY  July 2012  . TUBAL LIGATION  1984   Family History  Problem Relation Age of Onset  . Emphysema Father     smoked  . Asthma Father   . Lung cancer Father     smoked  . Hypertension Father   . Hypertension Mother   . Breast cancer Maternal Grandmother 60  . Heart disease Paternal Grandfather     myocardial infarction  . Stroke Maternal Grandfather    Social History   Social History  . Marital status: Married    Spouse name: N/A  . Number of children: 2  . Years of education: N/A   Occupational History  . Works at [employer]    Social History Main Topics  . Smoking status: Never Smoker  . Smokeless tobacco: Never Used  . Alcohol use 0.0 oz/week     Comment: occ wine with dinner  . Drug use: No  . Sexual activity: Yes   Other Topics Concern  . None   Social History  Narrative  . None    Outpatient Encounter Prescriptions as of 09/11/2016  Medication Sig  . albuterol (PROVENTIL HFA;VENTOLIN HFA) 108 (90 BASE) MCG/ACT inhaler Inhale 2 puffs using inhaler every 4-6 hours as needed for SOB/wheeze.  . budesonide (PULMICORT) 0.5 MG/2ML nebulizer solution Take 0.5 mg by nebulization 2 (two) times daily.  . budesonide-formoterol (SYMBICORT) 160-4.5 MCG/ACT inhaler Inhale 2 puffs into the lungs daily. as directed.  Marland Kitchen EPINEPHrine (EPIPEN 2-PAK) 0.3 mg/0.3 mL IJ SOAJ injection Inject 0.3 mLs (0.3 mg total) into the muscle once.  Marland Kitchen EPINEPHrine 0.3 mg/0.3 mL IJ SOAJ injection Inject 0.3 mg into the muscle once.  . hydrocortisone (ANUSOL-HC) 25 MG suppository Place 1 suppository (25 mg total) rectally 2 (two) times daily as needed for hemorrhoids or itching.  . hydrOXYzine (ATARAX/VISTARIL) 25 MG tablet Take 1 tablet (25 mg total) by mouth daily. Take one tablet by mouth once a day as needed.  . Hypertonic Nasal Wash (SINUS RINSE NA) As directed three times daily   . losartan (COZAAR) 25 MG tablet Take 1 tablet (25 mg total) by mouth daily.  Marland Kitchen omalizumab (XOLAIR) 150 MG injection  150 mg every 30 (thirty) days.   . [DISCONTINUED] hydrOXYzine (ATARAX/VISTARIL) 25 MG tablet Take 1 tablet (25 mg total) by mouth daily. Take one tablet by mouth once a day as needed.   No facility-administered encounter medications on file as of 09/11/2016.     Review of Systems  Constitutional: Negative for appetite change and unexpected weight change.  HENT: Negative for congestion and sinus pressure.   Eyes: Negative for pain and visual disturbance.  Respiratory: Negative for cough, chest tightness and shortness of breath.   Cardiovascular: Negative for chest pain, palpitations and leg swelling.  Gastrointestinal: Negative for abdominal pain, diarrhea, nausea and vomiting.  Genitourinary: Negative for difficulty urinating and dysuria.  Musculoskeletal: Negative for back pain and  joint swelling.  Skin: Negative for color change and rash.  Neurological: Negative for dizziness, light-headedness and headaches.  Hematological: Negative for adenopathy. Does not bruise/bleed easily.  Psychiatric/Behavioral: Negative for agitation and dysphoric mood.       Objective:     Blood pressure rechecked by me:  130/78  Physical Exam  Constitutional: She is oriented to person, place, and time. She appears well-developed and well-nourished. No distress.  HENT:  Nose: Nose normal.  Mouth/Throat: Oropharynx is clear and moist.  Eyes: Right eye exhibits no discharge. Left eye exhibits no discharge. No scleral icterus.  Neck: Neck supple. No thyromegaly present.  Cardiovascular: Normal rate and regular rhythm.   Pulmonary/Chest: Breath sounds normal. No accessory muscle usage. No tachypnea. No respiratory distress. She has no decreased breath sounds. She has no wheezes. She has no rhonchi. Right breast exhibits no inverted nipple, no mass, no nipple discharge and no tenderness (no axillary adenopathy). Left breast exhibits no inverted nipple, no mass, no nipple discharge and no tenderness (no axilarry adenopathy).  Abdominal: Soft. Bowel sounds are normal. There is no tenderness.  Musculoskeletal: She exhibits no edema or tenderness.  Lymphadenopathy:    She has no cervical adenopathy.  Neurological: She is alert and oriented to person, place, and time.  Skin: Skin is warm. No rash noted. No erythema.  Psychiatric: She has a normal mood and affect. Her behavior is normal.    BP 130/78   Pulse 81   Temp 98.4 F (36.9 C) (Oral)   Ht 5\' 5"  (1.651 m)   Wt 180 lb (81.6 kg)   LMP 11/03/1996   SpO2 98%   BMI 29.95 kg/m  Wt Readings from Last 3 Encounters:  09/11/16 180 lb (81.6 kg)  07/15/16 184 lb 12.8 oz (83.8 kg)  06/10/16 183 lb (83 kg)     Lab Results  Component Value Date   WBC 6.7 06/10/2016   HGB 13.3 06/10/2016   HCT 39.6 06/10/2016   PLT 197.0 06/10/2016    GLUCOSE 84 06/10/2016   CHOL 134 07/30/2015   TRIG 64.0 07/30/2015   HDL 54.30 07/30/2015   LDLCALC 67 07/30/2015   ALT 36 (H) 06/10/2016   AST 29 06/10/2016   NA 135 06/10/2016   K 4.1 06/10/2016   CL 102 06/10/2016   CREATININE 0.70 06/10/2016   BUN 13 06/10/2016   CO2 27 06/10/2016   TSH 3.84 07/30/2015    Mm Screening Breast Tomo Bilateral  Result Date: 08/13/2016 CLINICAL DATA:  Screening. EXAM: 2D DIGITAL SCREENING BILATERAL MAMMOGRAM WITH CAD AND ADJUNCT TOMO COMPARISON:  Previous exam(s). ACR Breast Density Category b: There are scattered areas of fibroglandular density. FINDINGS: There are no findings suspicious for malignancy. Images were processed with CAD. IMPRESSION: No  mammographic evidence of malignancy. A result letter of this screening mammogram will be mailed directly to the patient. RECOMMENDATION: Screening mammogram in one year. (Code:SM-B-01Y) BI-RADS CATEGORY  1: Negative. Electronically Signed   By: Pamelia Hoit M.D.   On: 08/13/2016 16:54       Assessment & Plan:   Problem List Items Addressed This Visit    Abnormal liver function tests    CT 07/2015 revealed changes c/w fatty liver.  Diet and exercise.  Follow liver panel.        Asthma    Breathing stable.  Continue current regimen.        Health care maintenance    Physical today 09/11/16.  Colonoscopy 03/2014 as outlined.  Recommended f/u colonoscopy in five years.  Mammogram 08/13/16 - Birads I.        History of colonic polyps    Colonoscopy 04/2014.  Recommended f/u colonoscopy in five years.       Hypertension    Blood pressure on recheck ok.  Continue same medication regimen.  Follow pressures.  Follow metabolic panel.       Leukopenia    Follow cbc.       Relevant Orders   CBC with Differential/Platelet   Visual disturbance    Has seen opthalmology.  Stable.         Other Visit Diagnoses   None.      Einar Pheasant, MD

## 2016-09-13 ENCOUNTER — Encounter: Payer: Self-pay | Admitting: Internal Medicine

## 2016-09-13 MED ORDER — HYDROXYZINE HCL 25 MG PO TABS: 25.0000 mg | ORAL_TABLET | Freq: Every day | ORAL | 1 refills | Status: DC

## 2016-09-13 NOTE — Assessment & Plan Note (Signed)
Has seen opthalmology.  Stable.

## 2016-09-13 NOTE — Assessment & Plan Note (Signed)
Colonoscopy 04/2014.  Recommended f/u colonoscopy in five years.

## 2016-09-13 NOTE — Assessment & Plan Note (Signed)
Follow cbc.  

## 2016-09-13 NOTE — Assessment & Plan Note (Signed)
Blood pressure on recheck ok.  Continue same medication regimen.  Follow pressures.  Follow metabolic panel.   

## 2016-09-13 NOTE — Assessment & Plan Note (Signed)
Physical today 09/11/16.  Colonoscopy 03/2014 as outlined.  Recommended f/u colonoscopy in five years.  Mammogram 08/13/16 - Birads I.

## 2016-09-13 NOTE — Assessment & Plan Note (Signed)
Breathing stable.  Continue current regimen.   

## 2016-09-13 NOTE — Assessment & Plan Note (Signed)
CT 07/2015 revealed changes c/w fatty liver.  Diet and exercise.  Follow liver panel.

## 2016-09-17 DIAGNOSIS — J4541 Moderate persistent asthma with (acute) exacerbation: Secondary | ICD-10-CM | POA: Diagnosis not present

## 2016-10-14 ENCOUNTER — Other Ambulatory Visit (INDEPENDENT_AMBULATORY_CARE_PROVIDER_SITE_OTHER): Payer: PPO

## 2016-10-14 DIAGNOSIS — D72819 Decreased white blood cell count, unspecified: Secondary | ICD-10-CM | POA: Diagnosis not present

## 2016-10-14 DIAGNOSIS — R7989 Other specified abnormal findings of blood chemistry: Secondary | ICD-10-CM

## 2016-10-14 DIAGNOSIS — I10 Essential (primary) hypertension: Secondary | ICD-10-CM | POA: Diagnosis not present

## 2016-10-14 DIAGNOSIS — R945 Abnormal results of liver function studies: Secondary | ICD-10-CM

## 2016-10-14 LAB — CBC WITH DIFFERENTIAL/PLATELET
BASOS ABS: 0 10*3/uL (ref 0.0–0.1)
Basophils Relative: 0.7 % (ref 0.0–3.0)
EOS ABS: 0.2 10*3/uL (ref 0.0–0.7)
Eosinophils Relative: 5.4 % — ABNORMAL HIGH (ref 0.0–5.0)
HEMATOCRIT: 39.1 % (ref 36.0–46.0)
Hemoglobin: 13.1 g/dL (ref 12.0–15.0)
LYMPHS PCT: 38.9 % (ref 12.0–46.0)
Lymphs Abs: 1.5 10*3/uL (ref 0.7–4.0)
MCHC: 33.5 g/dL (ref 30.0–36.0)
MCV: 86.4 fl (ref 78.0–100.0)
MONO ABS: 0.4 10*3/uL (ref 0.1–1.0)
Monocytes Relative: 10.7 % (ref 3.0–12.0)
NEUTROS ABS: 1.7 10*3/uL (ref 1.4–7.7)
NEUTROS PCT: 44.3 % (ref 43.0–77.0)
PLATELETS: 181 10*3/uL (ref 150.0–400.0)
RBC: 4.53 Mil/uL (ref 3.87–5.11)
RDW: 15.1 % (ref 11.5–15.5)
WBC: 3.8 10*3/uL — ABNORMAL LOW (ref 4.0–10.5)

## 2016-10-14 LAB — HEPATIC FUNCTION PANEL
ALBUMIN: 4.3 g/dL (ref 3.5–5.2)
ALK PHOS: 63 U/L (ref 39–117)
ALT: 46 U/L — ABNORMAL HIGH (ref 0–35)
AST: 41 U/L — AB (ref 0–37)
BILIRUBIN DIRECT: 0.1 mg/dL (ref 0.0–0.3)
TOTAL PROTEIN: 7.3 g/dL (ref 6.0–8.3)
Total Bilirubin: 0.5 mg/dL (ref 0.2–1.2)

## 2016-10-14 LAB — BASIC METABOLIC PANEL
BUN: 10 mg/dL (ref 6–23)
CALCIUM: 9.7 mg/dL (ref 8.4–10.5)
CO2: 29 meq/L (ref 19–32)
CREATININE: 0.71 mg/dL (ref 0.40–1.20)
Chloride: 105 mEq/L (ref 96–112)
GFR: 87.27 mL/min (ref >60.00)
Glucose, Bld: 103 mg/dL — ABNORMAL HIGH (ref 70–99)
Potassium: 4.3 mEq/L (ref 3.5–5.1)
Sodium: 139 mEq/L (ref 135–145)

## 2016-10-14 LAB — LIPID PANEL
Cholesterol: 160 mg/dL (ref 0–200)
HDL: 48.8 mg/dL (ref >39.00)
LDL Cholesterol: 94 mg/dL (ref 0–99)
NONHDL: 110.7
TRIGLYCERIDES: 84 mg/dL (ref 0.0–149.0)
Total CHOL/HDL Ratio: 3
VLDL: 16.8 mg/dL (ref 0.0–40.0)

## 2016-10-14 LAB — TSH: TSH: 3.72 u[IU]/mL (ref 0.35–4.50)

## 2016-10-15 ENCOUNTER — Telehealth: Payer: Self-pay | Admitting: *Deleted

## 2016-10-15 ENCOUNTER — Other Ambulatory Visit: Payer: Self-pay | Admitting: Internal Medicine

## 2016-10-15 DIAGNOSIS — R945 Abnormal results of liver function studies: Secondary | ICD-10-CM

## 2016-10-15 DIAGNOSIS — R739 Hyperglycemia, unspecified: Secondary | ICD-10-CM

## 2016-10-15 DIAGNOSIS — J4541 Moderate persistent asthma with (acute) exacerbation: Secondary | ICD-10-CM | POA: Diagnosis not present

## 2016-10-15 DIAGNOSIS — R7989 Other specified abnormal findings of blood chemistry: Secondary | ICD-10-CM

## 2016-10-15 DIAGNOSIS — D72819 Decreased white blood cell count, unspecified: Secondary | ICD-10-CM

## 2016-10-15 NOTE — Telephone Encounter (Signed)
Pt has requested lab results  Pt contact [phone]

## 2016-10-15 NOTE — Progress Notes (Signed)
Orders placed for f/u labs.  

## 2016-10-16 ENCOUNTER — Telehealth: Payer: Self-pay | Admitting: Internal Medicine

## 2016-10-16 NOTE — Telephone Encounter (Signed)
Pt called returning your call in regards to lab results. Please call pt before 10 am @ [phone]. Thank you!

## 2016-11-04 ENCOUNTER — Other Ambulatory Visit (INDEPENDENT_AMBULATORY_CARE_PROVIDER_SITE_OTHER): Payer: PPO

## 2016-11-04 DIAGNOSIS — R739 Hyperglycemia, unspecified: Secondary | ICD-10-CM | POA: Diagnosis not present

## 2016-11-04 DIAGNOSIS — D72819 Decreased white blood cell count, unspecified: Secondary | ICD-10-CM

## 2016-11-04 DIAGNOSIS — R945 Abnormal results of liver function studies: Secondary | ICD-10-CM

## 2016-11-04 DIAGNOSIS — R7989 Other specified abnormal findings of blood chemistry: Secondary | ICD-10-CM | POA: Diagnosis not present

## 2016-11-04 LAB — CBC WITH DIFFERENTIAL/PLATELET
BASOS ABS: 0 10*3/uL (ref 0.0–0.1)
Basophils Relative: 0.7 % (ref 0.0–3.0)
EOS ABS: 0.2 10*3/uL (ref 0.0–0.7)
Eosinophils Relative: 6 % — ABNORMAL HIGH (ref 0.0–5.0)
HEMATOCRIT: 39 % (ref 36.0–46.0)
HEMOGLOBIN: 13 g/dL (ref 12.0–15.0)
LYMPHS PCT: 40.4 % (ref 12.0–46.0)
Lymphs Abs: 1.4 10*3/uL (ref 0.7–4.0)
MCHC: 33.5 g/dL (ref 30.0–36.0)
MCV: 87 fl (ref 78.0–100.0)
MONOS PCT: 7.6 % (ref 3.0–12.0)
Monocytes Absolute: 0.3 10*3/uL (ref 0.1–1.0)
Neutro Abs: 1.6 10*3/uL (ref 1.4–7.7)
Neutrophils Relative %: 45.3 % (ref 43.0–77.0)
PLATELETS: 174 10*3/uL (ref 150.0–400.0)
RBC: 4.48 Mil/uL (ref 3.87–5.11)
RDW: 14.5 % (ref 11.5–15.5)
WBC: 3.4 10*3/uL — AB (ref 4.0–10.5)

## 2016-11-04 LAB — HEPATIC FUNCTION PANEL
ALBUMIN: 4.3 g/dL (ref 3.5–5.2)
ALT: 46 U/L — AB (ref 0–35)
AST: 50 U/L — ABNORMAL HIGH (ref 0–37)
Alkaline Phosphatase: 59 U/L (ref 39–117)
BILIRUBIN DIRECT: 0.1 mg/dL (ref 0.0–0.3)
TOTAL PROTEIN: 7.3 g/dL (ref 6.0–8.3)
Total Bilirubin: 0.6 mg/dL (ref 0.2–1.2)

## 2016-11-04 LAB — HEMOGLOBIN A1C: Hgb A1c MFr Bld: 5.7 % (ref 4.6–6.5)

## 2016-11-05 ENCOUNTER — Other Ambulatory Visit: Payer: Self-pay | Admitting: Internal Medicine

## 2016-11-05 DIAGNOSIS — D72819 Decreased white blood cell count, unspecified: Secondary | ICD-10-CM

## 2016-11-05 NOTE — Progress Notes (Signed)
Order placed for f/u cbc.   

## 2016-11-06 ENCOUNTER — Telehealth: Payer: Self-pay | Admitting: *Deleted

## 2016-11-06 NOTE — Telephone Encounter (Signed)
See result note.  

## 2016-11-06 NOTE — Telephone Encounter (Signed)
Pt requested lab results Pt contact [phone]

## 2016-11-07 ENCOUNTER — Other Ambulatory Visit: Payer: Self-pay | Admitting: Internal Medicine

## 2016-11-07 DIAGNOSIS — R945 Abnormal results of liver function studies: Secondary | ICD-10-CM

## 2016-11-07 DIAGNOSIS — R7989 Other specified abnormal findings of blood chemistry: Secondary | ICD-10-CM

## 2016-11-07 NOTE — Progress Notes (Signed)
Order placed for GI referral.   

## 2016-11-12 DIAGNOSIS — J4541 Moderate persistent asthma with (acute) exacerbation: Secondary | ICD-10-CM | POA: Diagnosis not present

## 2016-11-19 ENCOUNTER — Other Ambulatory Visit: Payer: Self-pay | Admitting: Surgical

## 2016-11-19 DIAGNOSIS — Z760 Encounter for issue of repeat prescription: Secondary | ICD-10-CM

## 2016-11-19 MED ORDER — LOSARTAN POTASSIUM 25 MG PO TABS: 25.0000 mg | ORAL_TABLET | Freq: Every day | ORAL | 0 refills | Status: DC

## 2016-11-24 ENCOUNTER — Telehealth: Payer: Self-pay | Admitting: Internal Medicine

## 2016-11-24 MED ORDER — HYDROCORTISONE ACETATE 25 MG RE SUPP: 25.0000 mg | Freq: Two times a day (BID) | RECTAL | 0 refills | Status: DC | PRN

## 2016-11-24 NOTE — Telephone Encounter (Signed)
Confirm no further bleeding and was blood on tissue only - with wiping.  I have sent in the anusol [facility] suppositories.  If she continues to have issues, she needs to be evaluated.

## 2016-11-24 NOTE — Telephone Encounter (Signed)
Left message to call.

## 2016-11-24 NOTE — Telephone Encounter (Signed)
Pt called and stated that she has had a hemorrhoid flare up and has tried the over the counter stuff and nothing has worked. She wanted to know if Dr. Nicki Reaper could call in [place]. Please advise, thank you!  Call pt @ [place], [place]

## 2016-11-24 NOTE — Telephone Encounter (Signed)
Confirm no bleeding and no thrombosed hemorrhoid.  If just irritation, then ok to call in anusol [facility] suppositories - one per rectum bid x 1 week.  If persistent problems, needs to be evaluated.

## 2016-11-24 NOTE — Telephone Encounter (Signed)
Pt called returning your call. Thank you! °

## 2016-11-24 NOTE — Telephone Encounter (Signed)
Called and spoke with patient and she confirmed that the red blood was only on the tissue when she wiped today after bowel movement.  Advised patient you would call in anusol suppositories .  She will come in for office visit if she continues to have issues.  She verbalized an understanding.

## 2016-11-24 NOTE — Telephone Encounter (Signed)
Reason for call:hemorrhoid flare  Symptoms: painful burning  Duration X 2 weeks  Medications:preparation H Last seen for this problem: NA Seen by:NA Would like  Ansuol called in.

## 2016-11-24 NOTE — Telephone Encounter (Signed)
rx sent in for anusol [facility] suppositories.

## 2016-11-24 NOTE — Telephone Encounter (Signed)
Spoke with patient saw bright red blood when she wiped this am after passing bowel movement. Stool was hard today, however stool was soft no problem passing bowel movement yesterday.  She states she does have hemorrhoid on the externally that has calmed down however the internal is causing the pain now.  She has appointment with Gastroenterology on 12/18/16. Please advise.

## 2016-11-27 DIAGNOSIS — J4540 Moderate persistent asthma, uncomplicated: Secondary | ICD-10-CM | POA: Diagnosis not present

## 2016-11-27 DIAGNOSIS — Z79899 Other long term (current) drug therapy: Secondary | ICD-10-CM | POA: Diagnosis not present

## 2016-12-02 ENCOUNTER — Other Ambulatory Visit (INDEPENDENT_AMBULATORY_CARE_PROVIDER_SITE_OTHER): Payer: PPO

## 2016-12-02 DIAGNOSIS — D72819 Decreased white blood cell count, unspecified: Secondary | ICD-10-CM

## 2016-12-02 LAB — CBC WITH DIFFERENTIAL/PLATELET
BASOS PCT: 0.7 % (ref 0.0–3.0)
Basophils Absolute: 0 10*3/uL (ref 0.0–0.1)
Eosinophils Absolute: 0.2 10*3/uL (ref 0.0–0.7)
Eosinophils Relative: 6.3 % — ABNORMAL HIGH (ref 0.0–5.0)
HCT: 38 % (ref 36.0–46.0)
HEMOGLOBIN: 13 g/dL (ref 12.0–15.0)
LYMPHS ABS: 1.8 10*3/uL (ref 0.7–4.0)
Lymphocytes Relative: 45.6 % (ref 12.0–46.0)
MCHC: 34.1 g/dL (ref 30.0–36.0)
MCV: 86.3 fl (ref 78.0–100.0)
MONO ABS: 0.4 10*3/uL (ref 0.1–1.0)
Monocytes Relative: 9.8 % (ref 3.0–12.0)
Neutro Abs: 1.4 10*3/uL (ref 1.4–7.7)
Neutrophils Relative %: 37.6 % — ABNORMAL LOW (ref 43.0–77.0)
Platelets: 171 10*3/uL (ref 150.0–400.0)
RBC: 4.41 Mil/uL (ref 3.87–5.11)
RDW: 14.4 % (ref 11.5–15.5)
WBC: 3.9 10*3/uL — ABNORMAL LOW (ref 4.0–10.5)

## 2016-12-03 ENCOUNTER — Encounter: Payer: Self-pay | Admitting: Internal Medicine

## 2016-12-04 ENCOUNTER — Other Ambulatory Visit: Payer: PPO

## 2016-12-18 ENCOUNTER — Other Ambulatory Visit: Payer: Self-pay | Admitting: Student

## 2016-12-18 DIAGNOSIS — K760 Fatty (change of) liver, not elsewhere classified: Secondary | ICD-10-CM | POA: Diagnosis not present

## 2016-12-18 DIAGNOSIS — K648 Other hemorrhoids: Secondary | ICD-10-CM | POA: Diagnosis not present

## 2016-12-18 DIAGNOSIS — R748 Abnormal levels of other serum enzymes: Secondary | ICD-10-CM

## 2016-12-26 ENCOUNTER — Ambulatory Visit
Admission: RE | Admit: 2016-12-26 | Discharge: 2016-12-26 | Disposition: A | Discharge status: Home / Self Care | Payer: PPO | Source: Ambulatory Visit | Attending: Student | Admitting: Student

## 2016-12-26 DIAGNOSIS — R7989 Other specified abnormal findings of blood chemistry: Secondary | ICD-10-CM | POA: Diagnosis not present

## 2016-12-26 DIAGNOSIS — Z9049 Acquired absence of other specified parts of digestive tract: Secondary | ICD-10-CM | POA: Insufficient documentation

## 2016-12-26 DIAGNOSIS — K760 Fatty (change of) liver, not elsewhere classified: Secondary | ICD-10-CM | POA: Insufficient documentation

## 2016-12-26 DIAGNOSIS — R748 Abnormal levels of other serum enzymes: Secondary | ICD-10-CM | POA: Diagnosis not present

## 2016-12-30 ENCOUNTER — Telehealth: Payer: Self-pay | Admitting: Internal Medicine

## 2016-12-30 NOTE — Telephone Encounter (Deleted)
Pt called about wanting to get her [place] results from 12/26/16 and labs on 12/18/16. Please advise?  Call pt @ [phone]. Thank you!

## 2016-12-31 ENCOUNTER — Telehealth: Payer: Self-pay

## 2016-12-31 NOTE — Telephone Encounter (Signed)
Pt called GI sent for U/S and she has not received results from them yet wanted to know if you could review and let her know what the results were? She  Is fine with a my chart message they will be at a funeral today.

## 2016-12-31 NOTE — Telephone Encounter (Signed)
Notify pt that GI should contact her with the result and if she does not hear from them, she needs to given them a call.  We will f/u as well.

## 2017-01-01 NOTE — Telephone Encounter (Signed)
Called pt she did hear from GI they are sending for needle biopsy. She will call if any further questions.

## 2017-01-02 ENCOUNTER — Other Ambulatory Visit: Payer: Self-pay | Admitting: Student

## 2017-01-02 DIAGNOSIS — R768 Other specified abnormal immunological findings in serum: Secondary | ICD-10-CM

## 2017-01-02 DIAGNOSIS — K760 Fatty (change of) liver, not elsewhere classified: Secondary | ICD-10-CM

## 2017-01-02 DIAGNOSIS — R748 Abnormal levels of other serum enzymes: Secondary | ICD-10-CM

## 2017-01-06 ENCOUNTER — Other Ambulatory Visit: Payer: Self-pay | Admitting: General Surgery

## 2017-01-07 ENCOUNTER — Ambulatory Visit
Admission: RE | Admit: 2017-01-07 | Discharge: 2017-01-07 | Disposition: A | Discharge status: Home / Self Care | Payer: PPO | Source: Ambulatory Visit | Attending: Student | Admitting: Student

## 2017-01-07 DIAGNOSIS — J45909 Unspecified asthma, uncomplicated: Secondary | ICD-10-CM | POA: Insufficient documentation

## 2017-01-07 DIAGNOSIS — Z825 Family history of asthma and other chronic lower respiratory diseases: Secondary | ICD-10-CM | POA: Diagnosis not present

## 2017-01-07 DIAGNOSIS — Z9889 Other specified postprocedural states: Secondary | ICD-10-CM | POA: Insufficient documentation

## 2017-01-07 DIAGNOSIS — K760 Fatty (change of) liver, not elsewhere classified: Secondary | ICD-10-CM

## 2017-01-07 DIAGNOSIS — R768 Other specified abnormal immunological findings in serum: Secondary | ICD-10-CM | POA: Insufficient documentation

## 2017-01-07 DIAGNOSIS — Z9049 Acquired absence of other specified parts of digestive tract: Secondary | ICD-10-CM | POA: Insufficient documentation

## 2017-01-07 DIAGNOSIS — I10 Essential (primary) hypertension: Secondary | ICD-10-CM | POA: Insufficient documentation

## 2017-01-07 DIAGNOSIS — K449 Diaphragmatic hernia without obstruction or gangrene: Secondary | ICD-10-CM | POA: Diagnosis not present

## 2017-01-07 DIAGNOSIS — R7989 Other specified abnormal findings of blood chemistry: Secondary | ICD-10-CM | POA: Diagnosis not present

## 2017-01-07 DIAGNOSIS — R748 Abnormal levels of other serum enzymes: Secondary | ICD-10-CM | POA: Diagnosis not present

## 2017-01-07 DIAGNOSIS — Z888 Allergy status to other drugs, medicaments and biological substances status: Secondary | ICD-10-CM | POA: Diagnosis not present

## 2017-01-07 DIAGNOSIS — Z823 Family history of stroke: Secondary | ICD-10-CM | POA: Insufficient documentation

## 2017-01-07 DIAGNOSIS — K7581 Nonalcoholic steatohepatitis (NASH): Secondary | ICD-10-CM | POA: Diagnosis not present

## 2017-01-07 DIAGNOSIS — Z79899 Other long term (current) drug therapy: Secondary | ICD-10-CM | POA: Insufficient documentation

## 2017-01-07 DIAGNOSIS — R7689 Other specified abnormal immunological findings in serum: Secondary | ICD-10-CM

## 2017-01-07 DIAGNOSIS — Z8249 Family history of ischemic heart disease and other diseases of the circulatory system: Secondary | ICD-10-CM | POA: Diagnosis not present

## 2017-01-07 DIAGNOSIS — Z803 Family history of malignant neoplasm of breast: Secondary | ICD-10-CM | POA: Diagnosis not present

## 2017-01-07 DIAGNOSIS — K219 Gastro-esophageal reflux disease without esophagitis: Secondary | ICD-10-CM | POA: Insufficient documentation

## 2017-01-07 DIAGNOSIS — R945 Abnormal results of liver function studies: Secondary | ICD-10-CM | POA: Diagnosis not present

## 2017-01-07 DIAGNOSIS — Z801 Family history of malignant neoplasm of trachea, bronchus and lung: Secondary | ICD-10-CM | POA: Diagnosis not present

## 2017-01-07 HISTORY — DX: Personal history of other diseases of the digestive system: Z87.19

## 2017-01-07 LAB — CBC
HEMATOCRIT: 37.9 % (ref 35.0–47.0)
HEMOGLOBIN: 13 g/dL (ref 12.0–16.0)
MCH: 29.5 pg (ref 26.0–34.0)
MCHC: 34.2 g/dL (ref 32.0–36.0)
MCV: 86.4 fL (ref 80.0–100.0)
PLATELETS: 164 10*3/uL (ref 150–440)
RBC: 4.39 MIL/uL (ref 3.80–5.20)
RDW: 14.5 % (ref 11.5–14.5)
WBC: 3.9 10*3/uL (ref 3.6–11.0)

## 2017-01-07 LAB — PROTIME-INR
INR: 0.94
Prothrombin Time: 12.6 seconds (ref 11.4–15.2)

## 2017-01-07 LAB — APTT: aPTT: 29 seconds (ref 24–36)

## 2017-01-07 MED ORDER — SODIUM CHLORIDE 0.9 % IV SOLN
INTRAVENOUS | Status: DC
  Administered 2017-01-07: 09:00:00 via INTRAVENOUS

## 2017-01-07 MED ORDER — ONDANSETRON HCL 4 MG/2ML IJ SOLN
INTRAMUSCULAR | Status: AC
  Filled 2017-01-07: qty 2

## 2017-01-07 MED ORDER — PROMETHAZINE HCL 25 MG/ML IJ SOLN
12.5000 mg | Freq: Once | INTRAMUSCULAR | Status: AC
  Administered 2017-01-07: 25 mg via INTRAVENOUS
  Filled 2017-01-07 (×2): qty 1

## 2017-01-07 MED ORDER — MIDAZOLAM HCL 5 MG/5ML IJ SOLN
INTRAMUSCULAR | Status: AC
  Filled 2017-01-07: qty 5

## 2017-01-07 MED ORDER — FENTANYL CITRATE (PF) 100 MCG/2ML IJ SOLN
INTRAMUSCULAR | Status: AC
  Filled 2017-01-07: qty 4

## 2017-01-07 MED ORDER — FENTANYL CITRATE (PF) 100 MCG/2ML IJ SOLN
INTRAMUSCULAR | Status: AC | PRN
  Administered 2017-01-07: 50 ug via INTRAVENOUS
  Administered 2017-01-07: 25 ug via INTRAVENOUS

## 2017-01-07 MED ORDER — ONDANSETRON HCL 4 MG/2ML IJ SOLN
4.0000 mg | Freq: Once | INTRAMUSCULAR | Status: AC
  Administered 2017-01-07: 4 mg via INTRAVENOUS
  Filled 2017-01-07: qty 2

## 2017-01-07 MED ORDER — FAMOTIDINE IN NACL 20-0.9 MG/50ML-% IV SOLN
20.0000 mg | Freq: Once | INTRAVENOUS | Status: AC
  Administered 2017-01-07: 20 mg via INTRAVENOUS
  Filled 2017-01-07: qty 50

## 2017-01-07 MED ORDER — KETOROLAC TROMETHAMINE 60 MG/2ML IM SOLN
INTRAMUSCULAR | Status: AC
  Filled 2017-01-07: qty 2

## 2017-01-07 MED ORDER — MIDAZOLAM HCL 5 MG/5ML IJ SOLN
INTRAMUSCULAR | Status: AC | PRN
  Administered 2017-01-07 (×2): 1 mg via INTRAVENOUS

## 2017-01-07 MED ORDER — KETOROLAC TROMETHAMINE 30 MG/ML IJ SOLN
30.0000 mg | Freq: Once | INTRAMUSCULAR | Status: AC
  Administered 2017-01-07: 30 mg via INTRAVENOUS
  Filled 2017-01-07: qty 1

## 2017-01-07 NOTE — Progress Notes (Signed)
Patient having N/V x5.  Zofran 4 mg IV given-see MAR.  Dr. Barbie Banner notified. Will give Phenergan 25 mg IV for continuing N/V.

## 2017-01-07 NOTE — Procedures (Signed)
Random liver Bx 18 g times three No comp/EBL 

## 2017-01-07 NOTE — Consult Note (Signed)
Chief Complaint: Patient was seen in consultation today for No chief complaint on file.  at the request of Johnson,Michelle C  Referring Physician(s): Lavera Guise  Supervising Physician: Marybelle Killings  Patient Status: [facility] - Out-pt  History of Present Illness: Chloe Moyer is a 66 y.o. female with recent elevated LFT's and steatosis on liver US. She is referred for random liver biopsy.  Past Medical History:  Diagnosis Date  . Allergic rhinitis   . Arthritis   . Asthma   . Diverticulosis, sigmoid   . GERD (gastroesophageal reflux disease)   . History of hiatal hernia   . Hypertension 06-12-2005  . Ulcer [facility])     Past Surgical History:  Procedure Laterality Date  . BREAST CYST ASPIRATION Left 1990  . CARDIAC CATHETERIZATION  July 2012  . CHOLECYSTECTOMY  1991  . LIPOMA EXCISION  4/99   Left flank area  . NASAL SINUS SURGERY  July 2012  . TUBAL LIGATION  1984    Allergies: Sulfamethoxazole-trimethoprim; Lisinopril; Septra [bactrim]; and Sulfa antibiotics  Medications: Prior to Admission medications   Medication Sig Start Date End Date Taking? Authorizing Provider  albuterol (PROVENTIL HFA;VENTOLIN HFA) 108 (90 BASE) MCG/ACT inhaler Inhale 2 puffs using inhaler every 4-6 hours as needed for SOB/wheeze.   Yes Historical Provider, MD  budesonide (PULMICORT) 0.5 MG/2ML nebulizer solution Take 0.5 mg by nebulization 2 (two) times daily.   Yes Historical Provider, MD  budesonide-formoterol (SYMBICORT) 160-4.5 MCG/ACT inhaler Inhale 2 puffs into the lungs daily. as directed. 03/06/16  Yes Einar Pheasant, MD  hydrocortisone (ANUSOL-HC) 25 MG suppository Place 1 suppository (25 mg total) rectally 2 (two) times daily as needed for hemorrhoids or itching. 11/24/16  Yes Einar Pheasant, MD  hydrOXYzine (ATARAX/VISTARIL) 25 MG tablet Take 1 tablet (25 mg total) by mouth daily. Take one tablet by mouth once a day as needed. 09/13/16  Yes Einar Pheasant, MD  Hypertonic  Nasal Wash (SINUS RINSE NA) As directed three times daily    Yes Historical Provider, MD  losartan (COZAAR) 25 MG tablet Take 1 tablet (25 mg total) by mouth daily. 11/19/16  Yes Einar Pheasant, MD  EPINEPHrine (EPIPEN 2-PAK) 0.3 mg/0.3 mL IJ SOAJ injection Inject 0.3 mLs (0.3 mg total) into the muscle once. 02/27/16   Einar Pheasant, MD  EPINEPHrine 0.3 mg/0.3 mL IJ SOAJ injection Inject 0.3 mg into the muscle once.    Historical Provider, MD  omalizumab Arvid Right) 150 MG injection 150 mg every 30 (thirty) days.  02/01/13   Historical Provider, MD     Family History  Problem Relation Age of Onset  . Emphysema Father     smoked  . Asthma Father   . Lung cancer Father     smoked  . Hypertension Father   . Hypertension Mother   . Breast cancer Maternal Grandmother 60  . Heart disease Paternal Grandfather     myocardial infarction  . Stroke Maternal Grandfather     Social History   Social History  . Marital status: Married    Spouse name: N/A  . Number of children: 2  . Years of education: N/A   Occupational History  . Works at [employer]    Social History Main Topics  . Smoking status: Never Smoker  . Smokeless tobacco: Never Used  . Alcohol use 0.0 oz/week     Comment: occ wine with dinner  . Drug use: No  . Sexual activity: Yes   Other Topics Concern  .  None   Social History Narrative  . None      Review of Systems: A 12 point ROS discussed and pertinent positives are indicated in the HPI above.  All other systems are negative.  Review of Systems  Vital Signs: BP 131/90 Comment: right arm  Pulse 88   Resp 14   Ht 5\' 5"  (1.651 m)   Wt 181 lb (82.1 kg)   LMP 11/03/1996   SpO2 96%   BMI 30.12 kg/m   Physical Exam  Constitutional: She is oriented to person, place, and time. She appears well-developed and well-nourished.  HENT:  Left Ear: External ear normal.  Cardiovascular: Normal rate and regular rhythm.   Pulmonary/Chest: Effort normal and breath  sounds normal.  Neurological: She is alert and oriented to person, place, and time.  Skin: Skin is warm and dry.      Mallampati Score:  3  Imaging: US Abdomen Complete  Result Date: 12/26/2016 CLINICAL DATA:  Elevated liver function tests. Prior cholecystectomy. EXAM: ULTRASOUND ABDOMEN ULTRASOUND HEPATIC ELASTOGRAPHY TECHNIQUE: Sonography of the upper abdomen was performed. In addition, ultrasound elastography evaluation of the liver was performed. A region of interest was placed within the right lobe of the liver. Following application of a compressive sonographic pulse, shear waves were detected in the adjacent hepatic tissue and the shear wave velocity was calculated. Multiple assessments were performed at the selected site. Median shear wave velocity is correlated to a Metavir fibrosis score. COMPARISON:  None. FINDINGS: ULTRASOUND ABDOMEN Gallbladder: Surgically absent. Common bile duct: Diameter: 4 mm, within normal limits. Liver: Diffusely increased parenchymal echogenicity, consistent with hepatic steatosis. No liver mass visualized. IVC: No abnormality visualized. Pancreas: Visualized portion unremarkable. Spleen: Size and appearance within normal limits. Right Kidney: Length: 9.8 cm. Echogenicity within normal limits. No mass or hydronephrosis visualized. Left Kidney: Length: 9.6 cm. Echogenicity within normal limits. No mass or hydronephrosis visualized. Abdominal aorta: No aneurysm visualized. Other findings: None. ULTRASOUND HEPATIC ELASTOGRAPHY Device: Siemens Helix VTQ Patient position: Supine Transducer 6C1 Number of measurements: 10 Hepatic segment:  8 Median velocity:   2.10  m/sec IQR: 0.22 IQR/Median velocity ratio: 0.10 Corresponding Metavir fibrosis score:  F2 + some F3 Risk of fibrosis: Moderate Limitations of exam: None Pertinent findings noted on other imaging exams:  None Please note that abnormal shear wave velocities may also be identified in clinical settings other than  with hepatic fibrosis, such as: acute hepatitis, elevated right heart and central venous pressures including use of beta blockers, veno-occlusive disease (Budd-Chiari), infiltrative processes such as mastocytosis/amyloidosis/infiltrative tumor, extrahepatic cholestasis, in the post-prandial state, and liver transplantation. Correlation with patient history, laboratory data, and clinical condition recommended. IMPRESSION: ULTRASOUND ABDOMEN: Diffuse hepatic steatosis.  No hepatic mass visualized. Prior cholecystectomy. No evidence of biliary dilatation or other significant abnormality . ULTRASOUND HEPATIC ELASTOGRAPHY: Median hepatic shear wave velocity is calculated at 2.10 m/sec. Corresponding Metavir fibrosis score is  F2 + some F3. Risk of fibrosis is Moderate. Follow-up: Additional testing appropriate Electronically Signed   By: Earle Gell M.D.   On: 12/26/2016 09:08   [place] Arfi Elastography Add On  Result Date: 12/26/2016 CLINICAL DATA:  Elevated liver function tests. Prior cholecystectomy. EXAM: ULTRASOUND ABDOMEN ULTRASOUND HEPATIC ELASTOGRAPHY TECHNIQUE: Sonography of the upper abdomen was performed. In addition, ultrasound elastography evaluation of the liver was performed. A region of interest was placed within the right lobe of the liver. Following application of a compressive sonographic pulse, shear waves were detected in the adjacent hepatic tissue and  the shear wave velocity was calculated. Multiple assessments were performed at the selected site. Median shear wave velocity is correlated to a Metavir fibrosis score. COMPARISON:  None. FINDINGS: ULTRASOUND ABDOMEN Gallbladder: Surgically absent. Common bile duct: Diameter: 4 mm, within normal limits. Liver: Diffusely increased parenchymal echogenicity, consistent with hepatic steatosis. No liver mass visualized. IVC: No abnormality visualized. Pancreas: Visualized portion unremarkable. Spleen: Size and appearance within normal limits. Right Kidney:  Length: 9.8 cm. Echogenicity within normal limits. No mass or hydronephrosis visualized. Left Kidney: Length: 9.6 cm. Echogenicity within normal limits. No mass or hydronephrosis visualized. Abdominal aorta: No aneurysm visualized. Other findings: None. ULTRASOUND HEPATIC ELASTOGRAPHY Device: Siemens Helix VTQ Patient position: Supine Transducer 6C1 Number of measurements: 10 Hepatic segment:  8 Median velocity:   2.10  m/sec IQR: 0.22 IQR/Median velocity ratio: 0.10 Corresponding Metavir fibrosis score:  F2 + some F3 Risk of fibrosis: Moderate Limitations of exam: None Pertinent findings noted on other imaging exams:  None Please note that abnormal shear wave velocities may also be identified in clinical settings other than with hepatic fibrosis, such as: acute hepatitis, elevated right heart and central venous pressures including use of beta blockers, veno-occlusive disease (Budd-Chiari), infiltrative processes such as mastocytosis/amyloidosis/infiltrative tumor, extrahepatic cholestasis, in the post-prandial state, and liver transplantation. Correlation with patient history, laboratory data, and clinical condition recommended. IMPRESSION: ULTRASOUND ABDOMEN: Diffuse hepatic steatosis.  No hepatic mass visualized. Prior cholecystectomy. No evidence of biliary dilatation or other significant abnormality . ULTRASOUND HEPATIC ELASTOGRAPHY: Median hepatic shear wave velocity is calculated at 2.10 m/sec. Corresponding Metavir fibrosis score is  F2 + some F3. Risk of fibrosis is Moderate. Follow-up: Additional testing appropriate Electronically Signed   By: Earle Gell M.D.   On: 12/26/2016 09:08    Labs:  CBC:  Recent Labs  10/14/16 0803 11/04/16 0940 12/02/16 0802 01/07/17 0757  WBC 3.8* 3.4* 3.9* 3.9  HGB 13.1 13.0 13.0 13.0  HCT 39.1 39.0 38.0 37.9  PLT 181.0 174.0 171.0 164    COAGS:  Recent Labs  01/07/17 0757  INR 0.94  APTT 29    BMP:  Recent Labs  02/18/16 1119 06/10/16 1200  10/14/16 0803  NA 138 135 139  K 3.9 4.1 4.3  CL 109 102 105  CO2 25 27 29   GLUCOSE 105* 84 103*  BUN 10 13 10   CALCIUM 9.8 10.2 9.7  CREATININE 0.59 0.70 0.71  GFRNONAA >60  --   --   GFRAA >60  --   --     LIVER FUNCTION TESTS:  Recent Labs  06/10/16 1200 10/14/16 0803 11/04/16 0940  BILITOT 1.0 0.5 0.6  AST 29 41* 50*  ALT 36* 46* 46*  ALKPHOS 59 63 59  PROT 7.9 7.3 7.3  ALBUMIN 4.4 4.3 4.3    TUMOR MARKERS: No results for input(s): AFPTM, CEA, CA199, CHROMGRNA in the last 8760 hours.  Assessment and Plan:  Elevated LFT's. Liver biopsy to follow.  Thank you for this interesting consult.  I greatly enjoyed meeting Chloe Moyer and look forward to participating in their care.  A copy of this report was sent to the requesting provider on this date.  Electronically Signed: Mika Anastasi, ART A 01/07/2017, 8:39 AM   I spent a total of  40 Minutes   in face to face in clinical consultation, greater than 50% of which was counseling/coordinating care for liver biopsy.

## 2017-01-09 LAB — SURGICAL PATHOLOGY

## 2017-02-24 DIAGNOSIS — J019 Acute sinusitis, unspecified: Secondary | ICD-10-CM | POA: Diagnosis not present

## 2017-02-27 DIAGNOSIS — J4540 Moderate persistent asthma, uncomplicated: Secondary | ICD-10-CM | POA: Diagnosis not present

## 2017-02-27 DIAGNOSIS — Z889 Allergy status to unspecified drugs, medicaments and biological substances status: Secondary | ICD-10-CM | POA: Diagnosis not present

## 2017-02-27 DIAGNOSIS — J329 Chronic sinusitis, unspecified: Secondary | ICD-10-CM | POA: Diagnosis not present

## 2017-02-27 DIAGNOSIS — J4541 Moderate persistent asthma with (acute) exacerbation: Secondary | ICD-10-CM | POA: Diagnosis not present

## 2017-03-12 ENCOUNTER — Ambulatory Visit: Payer: PPO | Admitting: Internal Medicine

## 2017-03-24 DIAGNOSIS — K7581 Nonalcoholic steatohepatitis (NASH): Secondary | ICD-10-CM | POA: Diagnosis not present

## 2017-03-24 DIAGNOSIS — K648 Other hemorrhoids: Secondary | ICD-10-CM | POA: Diagnosis not present

## 2017-03-24 DIAGNOSIS — R7989 Other specified abnormal findings of blood chemistry: Secondary | ICD-10-CM | POA: Diagnosis not present

## 2017-04-14 ENCOUNTER — Other Ambulatory Visit: Payer: Self-pay

## 2017-04-14 DIAGNOSIS — Z760 Encounter for issue of repeat prescription: Secondary | ICD-10-CM

## 2017-04-14 MED ORDER — LOSARTAN POTASSIUM 25 MG PO TABS: 25.0000 mg | ORAL_TABLET | Freq: Every day | ORAL | 0 refills | Status: DC

## 2017-04-22 DIAGNOSIS — L814 Other melanin hyperpigmentation: Secondary | ICD-10-CM | POA: Diagnosis not present

## 2017-04-22 DIAGNOSIS — D2330 Other benign neoplasm of skin of unspecified part of face: Secondary | ICD-10-CM | POA: Diagnosis not present

## 2017-05-29 DIAGNOSIS — J328 Other chronic sinusitis: Secondary | ICD-10-CM | POA: Diagnosis not present

## 2017-05-29 DIAGNOSIS — Z889 Allergy status to unspecified drugs, medicaments and biological substances status: Secondary | ICD-10-CM | POA: Diagnosis not present

## 2017-05-29 DIAGNOSIS — T782XXA Anaphylactic shock, unspecified, initial encounter: Secondary | ICD-10-CM | POA: Diagnosis not present

## 2017-05-29 DIAGNOSIS — R05 Cough: Secondary | ICD-10-CM | POA: Diagnosis not present

## 2017-05-29 DIAGNOSIS — J4540 Moderate persistent asthma, uncomplicated: Secondary | ICD-10-CM | POA: Diagnosis not present

## 2017-06-09 ENCOUNTER — Other Ambulatory Visit: Payer: Self-pay | Admitting: Internal Medicine

## 2017-06-09 ENCOUNTER — Ambulatory Visit (INDEPENDENT_AMBULATORY_CARE_PROVIDER_SITE_OTHER): Payer: PPO | Admitting: Internal Medicine

## 2017-06-09 ENCOUNTER — Encounter: Payer: Self-pay | Admitting: Internal Medicine

## 2017-06-09 VITALS — BP 114/78 | HR 92 | Temp 98.6°F | Resp 12 | Ht 65.0 in | Wt 177.2 lb

## 2017-06-09 DIAGNOSIS — I10 Essential (primary) hypertension: Secondary | ICD-10-CM

## 2017-06-09 DIAGNOSIS — J4520 Mild intermittent asthma, uncomplicated: Secondary | ICD-10-CM | POA: Diagnosis not present

## 2017-06-09 DIAGNOSIS — Z760 Encounter for issue of repeat prescription: Secondary | ICD-10-CM | POA: Diagnosis not present

## 2017-06-09 DIAGNOSIS — Z1231 Encounter for screening mammogram for malignant neoplasm of breast: Secondary | ICD-10-CM

## 2017-06-09 DIAGNOSIS — R7989 Other specified abnormal findings of blood chemistry: Secondary | ICD-10-CM

## 2017-06-09 DIAGNOSIS — D72819 Decreased white blood cell count, unspecified: Secondary | ICD-10-CM

## 2017-06-09 DIAGNOSIS — R739 Hyperglycemia, unspecified: Secondary | ICD-10-CM

## 2017-06-09 DIAGNOSIS — R945 Abnormal results of liver function studies: Secondary | ICD-10-CM | POA: Diagnosis not present

## 2017-06-09 DIAGNOSIS — Z1239 Encounter for other screening for malignant neoplasm of breast: Secondary | ICD-10-CM

## 2017-06-09 MED ORDER — LOSARTAN POTASSIUM 25 MG PO TABS: 25.0000 mg | ORAL_TABLET | Freq: Every day | ORAL | 1 refills | Status: DC

## 2017-06-09 NOTE — Progress Notes (Signed)
Patient ID: HERO MCCATHERN, female   DOB: [birth year], 66 y.o.   MRN: [id number]   Subjective:    Patient ID: BRITANNY MARKSBERRY, female    DOB: 02-26-1951, 66 y.o.   MRN: [id number]  HPI  Patient here for a scheduled follow up.  She has recently had flare of her asthma (allergic asthma).  Being followed at [facility].  Has had rounds of prednisone, etc recently.  Currently maintained on prednisone 5mg  per day.  Also using her symbicort and budesinide drops.  Currently stable.  Uses nebulizer prn.  Needs a portable neb.  No chest pain.  No acid reflux.  No abdominal pain.  Bowels moving.  Recently worked up for fatty liver.  Had liver biopsy - revealed mild fatty liver - Grade I.  She is walking.     Past Medical History:  Diagnosis Date  . Allergic rhinitis   . Arthritis   . Asthma   . Diverticulosis, sigmoid   . GERD (gastroesophageal reflux disease)   . History of hiatal hernia   . Hypertension 06-12-2005  . Ulcer    Past Surgical History:  Procedure Laterality Date  . BREAST CYST ASPIRATION Left 1990  . CARDIAC CATHETERIZATION  July 2012  . CHOLECYSTECTOMY  1991  . LIPOMA EXCISION  4/99   Left flank area  . NASAL SINUS SURGERY  July 2012  . TUBAL LIGATION  1984   Family History  Problem Relation Age of Onset  . Emphysema Father        smoked  . Asthma Father   . Lung cancer Father        smoked  . Hypertension Father   . Hypertension Mother   . Breast cancer Maternal Grandmother 60  . Heart disease Paternal Grandfather        myocardial infarction  . Stroke Maternal Grandfather    Social History   Social History  . Marital status: Married    Spouse name: N/A  . Number of children: 2  . Years of education: N/A   Occupational History  . Works at [employer]    Social History Main Topics  . Smoking status: Never Smoker  . Smokeless tobacco: Never Used  . Alcohol use 0.0 oz/week     Comment: occ wine with dinner  . Drug use: No  . Sexual activity: Yes    Other Topics Concern  . None   Social History Narrative  . None    Outpatient Encounter Prescriptions as of 06/09/2017  Medication Sig  . albuterol (PROVENTIL HFA;VENTOLIN HFA) 108 (90 BASE) MCG/ACT inhaler Inhale 2 puffs using inhaler every 4-6 hours as needed for SOB/wheeze.  . budesonide (PULMICORT) 0.5 MG/2ML nebulizer solution Take 0.5 mg by nebulization 2 (two) times daily.  . budesonide-formoterol (SYMBICORT) 160-4.5 MCG/ACT inhaler Inhale 2 puffs into the lungs daily. as directed.  Marland Kitchen EPINEPHrine (EPIPEN 2-PAK) 0.3 mg/0.3 mL IJ SOAJ injection Inject 0.3 mLs (0.3 mg total) into the muscle once.  Marland Kitchen EPINEPHrine 0.3 mg/0.3 mL IJ SOAJ injection Inject 0.3 mg into the muscle once.  . hydrocortisone (ANUSOL-HC) 25 MG suppository Place 1 suppository (25 mg total) rectally 2 (two) times daily as needed for hemorrhoids or itching.  . hydrOXYzine (ATARAX/VISTARIL) 25 MG tablet Take 1 tablet (25 mg total) by mouth daily. Take one tablet by mouth once a day as needed.  . Hypertonic Nasal Wash (SINUS RINSE NA) As directed three times daily   . losartan (COZAAR) 25 MG tablet Take 1  tablet (25 mg total) by mouth daily. Hold until pt request refill.  Marland Kitchen omalizumab (XOLAIR) 150 MG injection 150 mg every 30 (thirty) days.   . [DISCONTINUED] losartan (COZAAR) 25 MG tablet Take 1 tablet (25 mg total) by mouth daily.   No facility-administered encounter medications on file as of 06/09/2017.     Review of Systems  Constitutional: Negative for appetite change and unexpected weight change.  HENT:       Has had issues with increased congestion, etc.  Better now.    Respiratory: Negative for cough, chest tightness and shortness of breath.        Breathing better.    Cardiovascular: Negative for chest pain, palpitations and leg swelling.  Gastrointestinal: Negative for abdominal pain, diarrhea, nausea and vomiting.  Genitourinary: Negative for difficulty urinating and dysuria.  Musculoskeletal: Negative  for joint swelling and myalgias.  Skin: Negative for color change and rash.  Neurological: Negative for dizziness, light-headedness and headaches.  Psychiatric/Behavioral: Negative for agitation and dysphoric mood.       Objective:     Blood pressure rechecked by me:  114/78  Physical Exam  Constitutional: She appears well-developed and well-nourished. No distress.  HENT:  Nose: Nose normal.  Mouth/Throat: Oropharynx is clear and moist.  Neck: Neck supple. No thyromegaly present.  Cardiovascular: Normal rate and regular rhythm.   Pulmonary/Chest: Breath sounds normal. No respiratory distress. She has no wheezes.  Abdominal: Soft. Bowel sounds are normal. There is no tenderness.  Musculoskeletal: She exhibits no edema or tenderness.  Lymphadenopathy:    She has no cervical adenopathy.  Skin: No rash noted. No erythema.  Psychiatric: She has a normal mood and affect. Her behavior is normal.    BP 114/78   Pulse 92   Temp 98.6 F (37 C) (Oral)   Resp 12   Ht 5\' 5"  (1.651 m)   Wt 177 lb 3.2 oz (80.4 kg)   LMP 11/03/1996   SpO2 97%   BMI 29.49 kg/m  Wt Readings from Last 3 Encounters:  06/09/17 177 lb 3.2 oz (80.4 kg)  01/07/17 181 lb (82.1 kg)  09/11/16 180 lb (81.6 kg)     Lab Results  Component Value Date   WBC 3.9 01/07/2017   HGB 13.0 01/07/2017   HCT 37.9 01/07/2017   PLT 164 01/07/2017   GLUCOSE 103 (H) 10/14/2016   CHOL 160 10/14/2016   TRIG 84.0 10/14/2016   HDL 48.80 10/14/2016   LDLCALC 94 10/14/2016   ALT 46 (H) 11/04/2016   AST 50 (H) 11/04/2016   NA 139 10/14/2016   K 4.3 10/14/2016   CL 105 10/14/2016   CREATININE 0.71 10/14/2016   BUN 10 10/14/2016   CO2 29 10/14/2016   TSH 3.72 10/14/2016   INR 0.94 01/07/2017   HGBA1C 5.7 11/04/2016    US Biopsy  Result Date: 01/07/2017 INDICATION: Abnormal liver function tests EXAM: ULTRASOUND-GUIDED RANDOM LIVER BIOPSY.  CORE. MEDICATIONS: None. ANESTHESIA/SEDATION: Fentanyl 100 mcg IV; Versed 2 mg  IV Moderate Sedation Time:  10 The patient was continuously monitored during the procedure by the interventional radiology nurse under my direct supervision. FLUOROSCOPY TIME:  Fluoroscopy Time:  minutes  seconds ( mGy). COMPLICATIONS: None immediate. PROCEDURE: Informed written consent was obtained from the patient after a thorough discussion of the procedural risks, benefits and alternatives. All questions were addressed. Maximal Sterile Barrier Technique was utilized including caps, mask, sterile gowns, sterile gloves, sterile drape, hand hygiene and skin antiseptic. A timeout was performed  prior to the initiation of the procedure. The right upper quadrant was prepped with ChloraPrep in a sterile fashion, and a sterile drape was applied covering the operative field. A sterile gown and sterile gloves were used for the procedure. Under sonographic guidance, an 17 gauge guide needle was advanced into the right lobe of the liver. Subsequently 3 18 gauge core biopsies were obtained. The guide needle was removed. Final imaging was performed. Patient tolerated the procedure well without complication. Vital sign monitoring by nursing staff during the procedure will continue as patient is in the special procedures unit for post procedure observation. FINDINGS: The images document guide needle placement within the right lobe of the liver. Post biopsy images demonstrate no hemorrhage. IMPRESSION: Successful ultrasound-guided random liver core biopsy. Electronically Signed   By: Marybelle Killings M.D.   On: 01/07/2017 12:28       Assessment & Plan:   Problem List Items Addressed This Visit    Abnormal liver function tests    W/up as outlined.  S/p liver biopsy.  Fatty liver.  Follow.        Relevant Orders   Hepatic function panel   Asthma    Continue current regimen.  Stable.        Hypertension    Blood pressure on recheck improved.  Continue current regimen.  Follow.        Relevant Medications   losartan  (COZAAR) 25 MG tablet   Other Relevant Orders   Lipid panel   Basic metabolic panel   Leukopenia    Stable white count.  Follow.         Other Visit Diagnoses    Breast cancer screening    -  Primary   Relevant Orders   MM DIGITAL SCREENING BILATERAL   Medication refill       Relevant Medications   losartan (COZAAR) 25 MG tablet   Hyperglycemia       Relevant Orders   Hemoglobin A1c       Einar Pheasant, MD

## 2017-06-09 NOTE — Progress Notes (Signed)
Pre-visit discussion using our clinic review tool. No additional management support is needed unless otherwise documented below in the visit note.  

## 2017-06-12 ENCOUNTER — Encounter: Payer: Self-pay | Admitting: Internal Medicine

## 2017-06-12 NOTE — Assessment & Plan Note (Signed)
Continue current regimen.  Stable.  

## 2017-06-12 NOTE — Assessment & Plan Note (Signed)
W/up as outlined.  S/p liver biopsy.  Fatty liver.  Follow.

## 2017-06-12 NOTE — Assessment & Plan Note (Signed)
Blood pressure on recheck improved.  Continue current regimen.  Follow.

## 2017-06-12 NOTE — Assessment & Plan Note (Signed)
Stable white count.  Follow.

## 2017-06-30 DIAGNOSIS — J4541 Moderate persistent asthma with (acute) exacerbation: Secondary | ICD-10-CM | POA: Diagnosis not present

## 2017-07-17 ENCOUNTER — Ambulatory Visit (INDEPENDENT_AMBULATORY_CARE_PROVIDER_SITE_OTHER): Payer: PPO

## 2017-07-17 VITALS — BP 120/70 | HR 76 | Temp 98.0°F | Resp 16 | Ht 65.0 in | Wt 178.4 lb

## 2017-07-17 DIAGNOSIS — Z1331 Encounter for screening for depression: Secondary | ICD-10-CM

## 2017-07-17 DIAGNOSIS — Z1389 Encounter for screening for other disorder: Secondary | ICD-10-CM | POA: Diagnosis not present

## 2017-07-17 DIAGNOSIS — Z23 Encounter for immunization: Secondary | ICD-10-CM

## 2017-07-17 DIAGNOSIS — Z0000 Encounter for general adult medical examination without abnormal findings: Secondary | ICD-10-CM

## 2017-07-17 NOTE — Patient Instructions (Addendum)
  Chloe Moyer , Thank you for taking time to come for your Medicare Wellness Visit. I appreciate your ongoing commitment to your health goals. Please review the following plan we discussed and let me know if I can assist you in the future.   Follow up with Dr. Nicki Reaper as needed.    Have a great day!  These are the goals we discussed: Goals    . Healthy Lifestyle          Low carb foods Stay active Stay hydrated       This is a list of the screening recommended for you and due dates:  Health Maintenance  Topic Date Due  . Flu Shot  06/17/2017  . Mammogram  08/13/2018  . Colon Cancer Screening  05/13/2019  . Tetanus Vaccine  04/29/2023  . DEXA scan (bone density measurement)  Completed  .  Hepatitis C: One time screening is recommended by Center for Disease Control  (CDC) for  adults born from [birth year] through 1967.   Completed  . Pneumonia vaccines  Completed

## 2017-07-17 NOTE — Progress Notes (Signed)
Subjective:   Chloe Moyer is a 66 y.o. female who presents for Medicare Annual (Subsequent) preventive examination.  Review of Systems:  No ROS.  Medicare Wellness Visit. Additional risk factors are reflected in the social history. Cardiac Risk Factors include: advanced age (>48men, >39 women);hypertension     Objective:     Vitals: BP 120/70 (BP Location: Left Arm, Patient Position: Sitting, Cuff Size: Normal)   Pulse 76   Temp 98 F (36.7 C) (Oral)   Resp 16   Ht 5\' 5"  (1.651 m)   Wt 178 lb 6.4 oz (80.9 kg)   LMP 11/03/1996   SpO2 97%   BMI 29.69 kg/m   Body mass index is 29.69 kg/m.   Tobacco History  Smoking Status  . Never Smoker  Smokeless Tobacco  . Never Used     Counseling given: Not Answered   Past Medical History:  Diagnosis Date  . Allergic rhinitis   . Arthritis   . Asthma   . Diverticulosis, sigmoid   . GERD (gastroesophageal reflux disease)   . History of hiatal hernia   . Hypertension 06-12-2005  . Ulcer    Past Surgical History:  Procedure Laterality Date  . BREAST CYST ASPIRATION Left 1990  . CARDIAC CATHETERIZATION  July 2012  . CHOLECYSTECTOMY  1991  . LIPOMA EXCISION  4/99   Left flank area  . NASAL SINUS SURGERY  July 2012  . TUBAL LIGATION  1984   Family History  Problem Relation Age of Onset  . Emphysema Father        smoked  . Asthma Father   . Lung cancer Father        smoked  . Hypertension Father   . Hypertension Mother   . Breast cancer Maternal Grandmother 60  . Heart disease Paternal Grandfather        myocardial infarction  . Stroke Maternal Grandfather    History  Sexual Activity  . Sexual activity: Yes    Outpatient Encounter Prescriptions as of 07/17/2017  Medication Sig  . albuterol (PROVENTIL HFA;VENTOLIN HFA) 108 (90 BASE) MCG/ACT inhaler Inhale 2 puffs using inhaler every 4-6 hours as needed for SOB/wheeze.  . budesonide (PULMICORT) 0.5 MG/2ML nebulizer solution Take 0.5 mg by nebulization 2  (two) times daily.  . budesonide-formoterol (SYMBICORT) 160-4.5 MCG/ACT inhaler Inhale 2 puffs into the lungs daily. as directed.  Marland Kitchen EPINEPHrine (EPIPEN 2-PAK) 0.3 mg/0.3 mL IJ SOAJ injection Inject 0.3 mLs (0.3 mg total) into the muscle once.  Marland Kitchen EPINEPHrine 0.3 mg/0.3 mL IJ SOAJ injection Inject 0.3 mg into the muscle once.  . hydrocortisone (ANUSOL-HC) 25 MG suppository Place 1 suppository (25 mg total) rectally 2 (two) times daily as needed for hemorrhoids or itching.  . hydrOXYzine (ATARAX/VISTARIL) 25 MG tablet Take 1 tablet (25 mg total) by mouth daily. Take one tablet by mouth once a day as needed.  . Hypertonic Nasal Wash (SINUS RINSE NA) As directed three times daily   . losartan (COZAAR) 25 MG tablet Take 1 tablet (25 mg total) by mouth daily. Hold until pt request refill.  . montelukast (SINGULAIR) 10 MG tablet Take 10 mg by mouth at bedtime.  Marland Kitchen omalizumab (XOLAIR) 150 MG injection 150 mg every 30 (thirty) days.   . predniSONE (DELTASONE) 5 MG tablet Take 5 mg by mouth daily.   No facility-administered encounter medications on file as of 07/17/2017.     Activities of Daily Living In your present state of health, do you have  any difficulty performing the following activities: 07/17/2017  Hearing? N  Vision? N  Difficulty concentrating or making decisions? N  Walking or climbing stairs? N  Dressing or bathing? N  Doing errands, shopping? N  Preparing Food and eating ? N  Using the Toilet? N  In the past six months, have you accidently leaked urine? N  Do you have problems with loss of bowel control? N  Managing your Medications? N  Managing your Finances? N  Housekeeping or managing your Housekeeping? N  Some recent data might be hidden    Patient Care Team: Einar Pheasant, MD as PCP - General (Internal Medicine) Beverly Gust, MD as Referring Physician (Unknown Physician Specialty) Powers, Eloy End, MD as Referring Physician (Pulmonary Disease) Senior, Michel Santee, MD  as Referring Physician (Otolaryngology)    Assessment:    This is a routine wellness examination for Chloe Moyer. The goal of the wellness visit is to assist the patient how to close the gaps in care and create a preventative care plan for the patient.   The roster of all physicians providing medical care to patient is listed in the Snapshot section of the chart.  Osteoporosis risk reviewed.    Safety issues reviewed; Smoke and carbon monoxide detectors in the home. Firearms locked up in the home. Wears seatbelts when driving or riding with others. Patient does wear sunscreen or protective clothing when in direct sunlight. No violence in the home.  Depression- PHQ 2 &9 complete.  No signs/symptoms or verbal communication regarding little pleasure in doing things, feeling down, depressed or hopeless. No changes in sleeping, energy, eating, concentrating.  No thoughts of self harm or harm towards others.  Time spent on this topic is 8 minutes.   Patient is alert, normal appearance, oriented to person/place/and time.  Correctly identified the president of the [place], recall of 3/3 words, and performing simple calculations. Displays appropriate judgement and can read correct time from watch face.   No new identified risk were noted.  No failures at ADL's or IADL's.    BMI- discussed the importance of a healthy diet, water intake and the benefits of aerobic exercise. Educational material provided.   24 hour diet recall: Breakfast: 2 eggs  Lunch: salmon Dinner: 1 hotdog with cheese Snack: none Daily fluid intake: 1.5 cups of caffeine, 4 cups of water   Dental- every 6 months.  Eye- Visual acuity not assessed per patient preference since they have regular follow up with the ophthalmologist.  Wears corrective lenses.  Sleep patterns- Sleeps 7-8 hours at night.  Wakes feeling rested.  High dose influenza vaccine administered R deltoid, tolerated well.  No complaint or concern.  No  redness/swelling at site.  Educational material provided.  Health maintenance gaps- closed.  Patient Concerns: None at this time. Follow up with PCP as needed. Exercise Activities and Dietary recommendations Current Exercise Habits: Home exercise routine, Type of exercise: walking, Time (Minutes): 20, Frequency (Times/Week): 4, Weekly Exercise (Minutes/Week): 80, Intensity: Moderate  Goals    . Healthy Lifestyle          Low carb foods Stay active Stay hydrated      Fall Risk Fall Risk  07/17/2017 07/15/2016 03/21/2015 10/10/2012  Falls in the past year? Yes No No No  Number falls in past yr: 1 - - -  Injury with Fall? No - - -  Comment Tripped over the cat - - -  Follow up Falls prevention discussed - - -   Depression Screen  PHQ 2/9 Scores 07/17/2017 07/15/2016 03/21/2015 10/10/2012  PHQ - 2 Score 0 0 0 0  PHQ- 9 Score 0 - - -     Cognitive Function MMSE - Mini Mental State Exam 07/17/2017 07/15/2016 03/21/2015  Orientation to time 5 5 5   Orientation to Place 5 5 5   Registration 3 3 3   Attention/ Calculation 5 5 5   Recall 3 3 3   Language- name 2 objects 2 2 2   Language- repeat 1 1 1   Language- follow 3 step command 3 3 3   Language- read & follow direction 1 1 1   Write a sentence 1 1 1   Copy design 1 1 1   Total score 30 30 30         Immunization History  Administered Date(s) Administered  . Influenza Split 09/27/2012, 09/27/2012  . Influenza, High Dose Seasonal PF 07/17/2017  . Influenza,inj,Quad PF,6+ Mos 07/28/2014, 08/21/2015  . Influenza-Unspecified 07/08/2016  . Pneumococcal Conjugate-13 03/22/2015  . Pneumococcal Polysaccharide-23 07/15/2016  . Tdap 04/28/2013  . Zoster 02/23/2014   Screening Tests Health Maintenance  Topic Date Due  . MAMMOGRAM  08/13/2018  . COLONOSCOPY  05/13/2019  . TETANUS/TDAP  04/29/2023  . INFLUENZA VACCINE  Completed  . DEXA SCAN  Completed  . Hepatitis C Screening  Completed  . PNA vac Low Risk Adult  Completed      Plan:     End of life planning; Advanced aging; Advanced directives discussed.  No HCPOA/Living Will.  Additional information declined at this time.  I have personally reviewed and noted the following in the patient's chart:   . Medical and social history . Use of alcohol, tobacco or illicit drugs  . Current medications and supplements . Functional ability and status . Nutritional status . Physical activity . Advanced directives . List of other physicians . Hospitalizations, surgeries, and ER visits in previous 12 months . Vitals . Screenings to include cognitive, depression, and falls . Referrals and appointments  In addition, I have reviewed and discussed with patient certain preventive protocols, quality metrics, and best practice recommendations. A written personalized care plan for preventive services as well as general preventive health recommendations were provided to patient.     Varney Biles, LPN  2/35/3614   Reviewed above information.  Agree with assessment and plan.  Dr Nicki Reaper

## 2017-07-28 DIAGNOSIS — J4541 Moderate persistent asthma with (acute) exacerbation: Secondary | ICD-10-CM | POA: Diagnosis not present

## 2017-08-17 ENCOUNTER — Ambulatory Visit
Admission: RE | Admit: 2017-08-17 | Discharge: 2017-08-17 | Disposition: A | Discharge status: Home / Self Care | Payer: PPO | Source: Ambulatory Visit | Attending: Internal Medicine | Admitting: Internal Medicine

## 2017-08-17 DIAGNOSIS — Z1231 Encounter for screening mammogram for malignant neoplasm of breast: Secondary | ICD-10-CM | POA: Diagnosis not present

## 2017-08-17 DIAGNOSIS — R921 Mammographic calcification found on diagnostic imaging of breast: Secondary | ICD-10-CM | POA: Diagnosis not present

## 2017-08-18 ENCOUNTER — Other Ambulatory Visit: Payer: Self-pay | Admitting: Internal Medicine

## 2017-08-18 DIAGNOSIS — R928 Other abnormal and inconclusive findings on diagnostic imaging of breast: Secondary | ICD-10-CM

## 2017-08-18 NOTE — Progress Notes (Signed)
Order placed for f/u right breast mammogram and ultrasound  

## 2017-08-20 ENCOUNTER — Ambulatory Visit
Admission: RE | Admit: 2017-08-20 | Discharge: 2017-08-20 | Disposition: A | Discharge status: Home / Self Care | Payer: PPO | Source: Ambulatory Visit | Attending: Internal Medicine | Admitting: Internal Medicine

## 2017-08-20 DIAGNOSIS — R921 Mammographic calcification found on diagnostic imaging of breast: Secondary | ICD-10-CM | POA: Insufficient documentation

## 2017-08-20 DIAGNOSIS — R928 Other abnormal and inconclusive findings on diagnostic imaging of breast: Secondary | ICD-10-CM | POA: Insufficient documentation

## 2017-08-21 ENCOUNTER — Telehealth: Payer: Self-pay | Admitting: Internal Medicine

## 2017-08-21 ENCOUNTER — Other Ambulatory Visit: Payer: Self-pay | Admitting: Internal Medicine

## 2017-08-21 DIAGNOSIS — R928 Other abnormal and inconclusive findings on diagnostic imaging of breast: Secondary | ICD-10-CM

## 2017-08-21 DIAGNOSIS — R921 Mammographic calcification found on diagnostic imaging of breast: Secondary | ICD-10-CM

## 2017-08-21 NOTE — Telephone Encounter (Signed)
Pt called back returning your call. Please advise, thank you!  Call pt@ [phone]

## 2017-08-21 NOTE — Telephone Encounter (Signed)
Dr. Scott spoke with patient 

## 2017-08-25 DIAGNOSIS — J4541 Moderate persistent asthma with (acute) exacerbation: Secondary | ICD-10-CM | POA: Diagnosis not present

## 2017-09-01 ENCOUNTER — Ambulatory Visit
Admission: RE | Admit: 2017-09-01 | Discharge: 2017-09-01 | Disposition: A | Discharge status: Home / Self Care | Payer: PPO | Source: Ambulatory Visit | Attending: Internal Medicine | Admitting: Internal Medicine

## 2017-09-01 DIAGNOSIS — R921 Mammographic calcification found on diagnostic imaging of breast: Secondary | ICD-10-CM | POA: Diagnosis not present

## 2017-09-01 DIAGNOSIS — R928 Other abnormal and inconclusive findings on diagnostic imaging of breast: Secondary | ICD-10-CM | POA: Diagnosis not present

## 2017-09-01 HISTORY — PX: BREAST BIOPSY: SHX20

## 2017-09-02 LAB — SURGICAL PATHOLOGY

## 2017-09-04 DIAGNOSIS — Z889 Allergy status to unspecified drugs, medicaments and biological substances status: Secondary | ICD-10-CM | POA: Diagnosis not present

## 2017-09-04 DIAGNOSIS — J329 Chronic sinusitis, unspecified: Secondary | ICD-10-CM | POA: Diagnosis not present

## 2017-09-04 DIAGNOSIS — J4540 Moderate persistent asthma, uncomplicated: Secondary | ICD-10-CM | POA: Diagnosis not present

## 2017-09-08 ENCOUNTER — Other Ambulatory Visit (INDEPENDENT_AMBULATORY_CARE_PROVIDER_SITE_OTHER): Payer: PPO

## 2017-09-08 DIAGNOSIS — R7989 Other specified abnormal findings of blood chemistry: Secondary | ICD-10-CM

## 2017-09-08 DIAGNOSIS — R945 Abnormal results of liver function studies: Secondary | ICD-10-CM | POA: Diagnosis not present

## 2017-09-08 DIAGNOSIS — I10 Essential (primary) hypertension: Secondary | ICD-10-CM

## 2017-09-08 DIAGNOSIS — R739 Hyperglycemia, unspecified: Secondary | ICD-10-CM

## 2017-09-08 LAB — LIPID PANEL
CHOL/HDL RATIO: 3
CHOLESTEROL: 126 mg/dL (ref 0–200)
HDL: 39.2 mg/dL (ref >39.00)
LDL Cholesterol: 71 mg/dL (ref 0–99)
NonHDL: 86.89
TRIGLYCERIDES: 81 mg/dL (ref 0.0–149.0)
VLDL: 16.2 mg/dL (ref 0.0–40.0)

## 2017-09-08 LAB — HEPATIC FUNCTION PANEL
ALBUMIN: 4.3 g/dL (ref 3.5–5.2)
ALK PHOS: 48 U/L (ref 39–117)
ALT: 31 U/L (ref 0–35)
AST: 37 U/L (ref 0–37)
BILIRUBIN DIRECT: 0.2 mg/dL (ref 0.0–0.3)
TOTAL PROTEIN: 7.3 g/dL (ref 6.0–8.3)
Total Bilirubin: 0.8 mg/dL (ref 0.2–1.2)

## 2017-09-08 LAB — BASIC METABOLIC PANEL
BUN: 9 mg/dL (ref 6–23)
CO2: 29 mEq/L (ref 19–32)
Calcium: 9.9 mg/dL (ref 8.4–10.5)
Chloride: 104 mEq/L (ref 96–112)
Creatinine, Ser: 0.64 mg/dL (ref 0.40–1.20)
GFR: 98.11 mL/min (ref >60.00)
GLUCOSE: 101 mg/dL — AB (ref 70–99)
POTASSIUM: 3.9 meq/L (ref 3.5–5.1)
SODIUM: 139 meq/L (ref 135–145)

## 2017-09-08 LAB — HEMOGLOBIN A1C: Hgb A1c MFr Bld: 5.8 % (ref 4.6–6.5)

## 2017-09-09 ENCOUNTER — Encounter: Payer: Self-pay | Admitting: Internal Medicine

## 2017-09-10 ENCOUNTER — Encounter: Payer: Self-pay | Admitting: Internal Medicine

## 2017-09-10 ENCOUNTER — Ambulatory Visit (INDEPENDENT_AMBULATORY_CARE_PROVIDER_SITE_OTHER): Payer: PPO | Admitting: Internal Medicine

## 2017-09-10 VITALS — BP 130/86 | HR 84 | Temp 97.9°F | Resp 16 | Ht 64.96 in | Wt 178.0 lb

## 2017-09-10 DIAGNOSIS — I10 Essential (primary) hypertension: Secondary | ICD-10-CM

## 2017-09-10 DIAGNOSIS — R7989 Other specified abnormal findings of blood chemistry: Secondary | ICD-10-CM

## 2017-09-10 DIAGNOSIS — J4520 Mild intermittent asthma, uncomplicated: Secondary | ICD-10-CM

## 2017-09-10 DIAGNOSIS — J329 Chronic sinusitis, unspecified: Secondary | ICD-10-CM | POA: Diagnosis not present

## 2017-09-10 DIAGNOSIS — R945 Abnormal results of liver function studies: Secondary | ICD-10-CM

## 2017-09-10 MED ORDER — DOXYCYCLINE HYCLATE 100 MG PO TABS: 100.0000 mg | ORAL_TABLET | Freq: Two times a day (BID) | ORAL | 0 refills | Status: DC

## 2017-09-10 NOTE — Progress Notes (Signed)
Patient ID: Chloe Moyer, female   DOB: [birth year], 66 y.o.   MRN: [id number]   Subjective:    Patient ID: Chloe Moyer, female    DOB: 07/17/1951, 66 y.o.   MRN: [id number]  HPI  Patient here for a scheduled follow up.  She recently had breast biopsy.  Biopsy ok.  Residual large hematoma.  She has been having some increased nasal congestion and green mucus production.  When bends over, head hurts.  Increased sinus pressure.  Has a history of recurring infections.  Sees ENT.  No chest congestion.  No sob.  No chest pain.  No acid reflux.  No abdominal pain.  Bowels moving.     Past Medical History:  Diagnosis Date  . Allergic rhinitis   . Arthritis   . Asthma   . Diverticulosis, sigmoid   . GERD (gastroesophageal reflux disease)   . History of hiatal hernia   . Hypertension 06-12-2005  . Ulcer    Past Surgical History:  Procedure Laterality Date  . BREAST BIOPSY Right 09/01/2017   Affirm Bx of 2 areas- path pending  . BREAST CYST ASPIRATION Left 1990  . CARDIAC CATHETERIZATION  July 2012  . CHOLECYSTECTOMY  1991  . LIPOMA EXCISION  4/99   Left flank area  . NASAL SINUS SURGERY  July 2012  . TUBAL LIGATION  1984   Family History  Problem Relation Age of Onset  . Emphysema Father        smoked  . Asthma Father   . Lung cancer Father        smoked  . Hypertension Father   . Hypertension Mother   . Breast cancer Maternal Grandmother 60  . Heart disease Paternal Grandfather        myocardial infarction  . Stroke Maternal Grandfather    Social History   Social History  . Marital status: Married    Spouse name: N/A  . Number of children: 2  . Years of education: N/A   Occupational History  . Works at [employer]    Social History Main Topics  . Smoking status: Never Smoker  . Smokeless tobacco: Never Used  . Alcohol use 0.0 oz/week     Comment: occ wine with dinner  . Drug use: No  . Sexual activity: Yes   Other Topics Concern  . None   Social  History Narrative  . None    Outpatient Encounter Prescriptions as of 09/10/2017  Medication Sig  . albuterol (PROVENTIL HFA;VENTOLIN HFA) 108 (90 BASE) MCG/ACT inhaler Inhale 2 puffs using inhaler every 4-6 hours as needed for SOB/wheeze.  . budesonide (PULMICORT) 0.5 MG/2ML nebulizer solution Take 0.5 mg by nebulization 2 (two) times daily.  . budesonide-formoterol (SYMBICORT) 160-4.5 MCG/ACT inhaler Inhale 2 puffs into the lungs daily. as directed.  Marland Kitchen EPINEPHrine (EPIPEN 2-PAK) 0.3 mg/0.3 mL IJ SOAJ injection Inject 0.3 mLs (0.3 mg total) into the muscle once.  Marland Kitchen EPINEPHrine 0.3 mg/0.3 mL IJ SOAJ injection Inject 0.3 mg into the muscle once.  . hydrocortisone (ANUSOL-HC) 25 MG suppository Place 1 suppository (25 mg total) rectally 2 (two) times daily as needed for hemorrhoids or itching.  . hydrOXYzine (ATARAX/VISTARIL) 25 MG tablet Take 1 tablet (25 mg total) by mouth daily. Take one tablet by mouth once a day as needed.  . Hypertonic Nasal Wash (SINUS RINSE NA) As directed three times daily   . losartan (COZAAR) 25 MG tablet Take 1 tablet (25 mg total) by mouth daily.  Hold until pt request refill.  . montelukast (SINGULAIR) 10 MG tablet Take 10 mg by mouth at bedtime.  Marland Kitchen omalizumab (XOLAIR) 150 MG injection 150 mg every 30 (thirty) days.   . predniSONE (DELTASONE) 5 MG tablet Take 5 mg by mouth daily.  Marland Kitchen doxycycline (VIBRA-TABS) 100 MG tablet Take 1 tablet (100 mg total) by mouth 2 (two) times daily.   No facility-administered encounter medications on file as of 09/10/2017.     Review of Systems  Constitutional: Negative for appetite change, fever and unexpected weight change.  HENT: Positive for congestion, postnasal drip and sinus pressure.   Respiratory: Negative for cough, chest tightness and shortness of breath.   Cardiovascular: Negative for chest pain, palpitations and leg swelling.  Gastrointestinal: Negative for abdominal pain, diarrhea, nausea and vomiting.  Genitourinary:  Negative for difficulty urinating and dysuria.  Musculoskeletal: Negative for joint swelling and myalgias.  Skin: Negative for color change and rash.  Neurological: Negative for dizziness, light-headedness and headaches.  Psychiatric/Behavioral: Negative for agitation and dysphoric mood.       Objective:    Physical Exam  Constitutional: She appears well-developed and well-nourished. No distress.  HENT:  Mouth/Throat: Oropharynx is clear and moist.  TMs clear.  Nares - slightly erythematous turbinates.  Tenderness to palpation over the sinuses.    Neck: Neck supple. No thyromegaly present.  Cardiovascular: Normal rate and regular rhythm.   Pulmonary/Chest: Breath sounds normal. No respiratory distress. She has no wheezes.  Breast:  Right - large hematoma with minimal tenderness.  No nipple discharge or nipple retraction present.  No axillary adenopathy.    Abdominal: Soft. Bowel sounds are normal. There is no tenderness.  Musculoskeletal: She exhibits no edema or tenderness.  Lymphadenopathy:    She has no cervical adenopathy.  Skin: No rash noted. No erythema.  Psychiatric: She has a normal mood and affect. Her behavior is normal.    BP 130/86 (BP Location: Left Arm, Patient Position: Sitting, Cuff Size: Large)   Pulse 84   Temp 97.9 F (36.6 C) (Oral)   Resp 16   Ht 5' 4.96" (1.65 m)   Wt 178 lb (80.7 kg)   LMP 11/03/1996   SpO2 95%   BMI 29.66 kg/m  Wt Readings from Last 3 Encounters:  09/10/17 178 lb (80.7 kg)  07/17/17 178 lb 6.4 oz (80.9 kg)  06/09/17 177 lb 3.2 oz (80.4 kg)     Lab Results  Component Value Date   WBC 3.9 01/07/2017   HGB 13.0 01/07/2017   HCT 37.9 01/07/2017   PLT 164 01/07/2017   GLUCOSE 101 (H) 09/08/2017   CHOL 126 09/08/2017   TRIG 81.0 09/08/2017   HDL 39.20 09/08/2017   LDLCALC 71 09/08/2017   ALT 31 09/08/2017   AST 37 09/08/2017   NA 139 09/08/2017   K 3.9 09/08/2017   CL 104 09/08/2017   CREATININE 0.64 09/08/2017   BUN 9  09/08/2017   CO2 29 09/08/2017   TSH 3.72 10/14/2016   INR 0.94 01/07/2017   HGBA1C 5.8 09/08/2017    Mm Clip Placement Right  Result Date: 09/01/2017 CLINICAL DATA:  Post stereotactic guided biopsy of calcifications in the lower inner right breast and stereotactic guided biopsy of calcifications in the upper inner right breast. EXAM: DIAGNOSTIC RIGHT MAMMOGRAM POST ULTRASOUND BIOPSY COMPARISON:  Previous exam(s). FINDINGS: Mammographic images were obtained following stereotactic guided biopsy of calcifications in the lower inner right breast and stereotactic guided biopsy of calcifications in the upper  inner right breast. An X shaped biopsy marking clip is present at the site of the biopsied calcifications in the lower inner right breast with presence of a small hematoma. A coil shaped biopsy marking clip is present at the site of the biopsied calcifications in the upper inner right breast with a moderate sized hematoma present. IMPRESSION: 1. X shaped biopsy marking clip at site of biopsied calcifications in the lower inner right breast. 2. Coil shaped biopsy marking clip at site of biopsied calcifications in the upper inner right breast. Final Assessment: Post Procedure Mammograms for Marker Placement Electronically Signed   By: Everlean Alstrom M.D.   On: 09/01/2017 09:23   Mm Rt Breast Bx W Loc Dev Ea Ad Lesion Img Bx Spec Stereo Guide  Addendum Date: 09/03/2017   ADDENDUM REPORT: 09/03/2017 11:55 ADDENDUM: PATHOLOGY ADDENDUM: Pathology: A. Breast calcification, right lower inner quadrant - fibroadenomatoid change with calcifications. Negative for atypia and malignancy. Bi. Right breast calcification, right upper inner quadrant - fibroadenomatoid change with calcifications. Negative for atypia and malignancy. Pathology concordant with imaging findings: Yes Recommendation: Return to screening mammography in 12 months. At the request of the patient, I spoke with her by telephone at 1132am on  09/03/17. She reports doing well after the biopsy with some persistent tenderness and bruising. Electronically Signed   By: Ammie Ferrier M.D.   On: 09/03/2017 11:55   Result Date: 09/03/2017 CLINICAL DATA:  66 year old female with indeterminate calcifications and the lower inner right breast and upper inner right breast. EXAM: RIGHT BREAST STEREOTACTIC CORE NEEDLE BIOPSY COMPARISON:  Previous exams. FINDINGS: The patient and I discussed the procedure of stereotactic-guided biopsy including benefits and alternatives. We discussed the high likelihood of a successful procedure. We discussed the risks of the procedure including infection, bleeding, tissue injury, clip migration, and inadequate sampling. Informed written consent was given. The usual time out protocol was performed immediately prior to the procedure. SITE 1: RIGHT BREAST LOWER INNER: CALCIFICATIONS: Using sterile technique and 1% Lidocaine as local anesthetic, under stereotactic guidance, a 9 gauge vacuum assisted device was used to perform core needle biopsy of the calcifications in the lower inner right breast using a medial to lateral approach. Specimen radiograph was performed showing the presence of calcifications. Specimens with calcifications are identified for pathology. Lesion quadrant: Lower inner At the conclusion of the procedure, and X shaped tissue marker clip was deployed into the biopsy cavity. SITE 2: RIGHT BREAST UPPER INNER: CALCIFICATIONS: Using sterile technique and 1% Lidocaine as local anesthetic, under stereotactic guidance, a 9 gauge vacuum assisted device was used to perform core needle biopsy of the calcifications in the upper inner right breast using a medial to lateral approach. Specimen radiograph was performed showing the presence of calcifications. Specimens with calcifications are identified for pathology. Lesion quadrant: Upper inner At the conclusion of the procedure, a coil shaped tissue marker clip was deployed  into the biopsy cavity. Follow-up 2-view mammogram was performed and dictated separately. IMPRESSION: 1. Stereotactic guided biopsy of calcifications in the lower inner right breast, at site of X shaped biopsy marking clip. 2. Stereotactic guided biopsy of calcifications in the upper inner right breast, at site of coil shaped biopsy marking clip. Electronically Signed: By: Everlean Alstrom M.D. On: 09/01/2017 09:05       Assessment & Plan:   Problem List Items Addressed This Visit    Abnormal liver function tests    S/p liver biopsy.  Recent liver panel wnl.  Follow.  Asthma    Breathing stable.  Continue current medication regimen.        Hypertension    Blood pressure on recheck improved.  Continue same medication regimen.  Follow pressures.  Follow metabolic panel.         Other Visit Diagnoses    Sinusitis, unspecified chronicity, unspecified location    -  Primary   Treat with doxycycline as directed.  continue her nasal sprays, etc.  follow.     Relevant Medications   doxycycline (VIBRA-TABS) 100 MG tablet       Einar Pheasant, MD

## 2017-09-12 ENCOUNTER — Encounter: Payer: Self-pay | Admitting: Internal Medicine

## 2017-09-12 NOTE — Assessment & Plan Note (Signed)
S/p liver biopsy.  Recent liver panel wnl.  Follow.

## 2017-09-12 NOTE — Assessment & Plan Note (Signed)
Blood pressure on recheck improved.  Continue same medication regimen.  Follow pressures.  Follow metabolic panel.   

## 2017-09-12 NOTE — Assessment & Plan Note (Signed)
Breathing stable.  Continue current medication regimen.  

## 2017-09-22 DIAGNOSIS — J4541 Moderate persistent asthma with (acute) exacerbation: Secondary | ICD-10-CM | POA: Diagnosis not present

## 2017-09-24 DIAGNOSIS — K7581 Nonalcoholic steatohepatitis (NASH): Secondary | ICD-10-CM | POA: Diagnosis not present

## 2017-09-29 ENCOUNTER — Other Ambulatory Visit: Payer: Self-pay | Admitting: Internal Medicine

## 2017-10-05 DIAGNOSIS — Z6828 Body mass index (BMI) 28.0-28.9, adult: Secondary | ICD-10-CM | POA: Diagnosis not present

## 2017-10-05 DIAGNOSIS — J329 Chronic sinusitis, unspecified: Secondary | ICD-10-CM | POA: Diagnosis not present

## 2017-10-05 DIAGNOSIS — J310 Chronic rhinitis: Secondary | ICD-10-CM | POA: Diagnosis not present

## 2017-10-20 DIAGNOSIS — J4541 Moderate persistent asthma with (acute) exacerbation: Secondary | ICD-10-CM | POA: Diagnosis not present

## 2017-11-02 ENCOUNTER — Ambulatory Visit (INDEPENDENT_AMBULATORY_CARE_PROVIDER_SITE_OTHER): Payer: PPO | Admitting: Internal Medicine

## 2017-11-02 ENCOUNTER — Encounter: Payer: Self-pay | Admitting: Internal Medicine

## 2017-11-02 VITALS — BP 122/76 | HR 91 | Temp 97.6°F | Ht 64.0 in | Wt 177.0 lb

## 2017-11-02 DIAGNOSIS — D72819 Decreased white blood cell count, unspecified: Secondary | ICD-10-CM

## 2017-11-02 DIAGNOSIS — J309 Allergic rhinitis, unspecified: Secondary | ICD-10-CM

## 2017-11-02 DIAGNOSIS — Z760 Encounter for issue of repeat prescription: Secondary | ICD-10-CM | POA: Diagnosis not present

## 2017-11-02 DIAGNOSIS — R739 Hyperglycemia, unspecified: Secondary | ICD-10-CM | POA: Diagnosis not present

## 2017-11-02 DIAGNOSIS — R7989 Other specified abnormal findings of blood chemistry: Secondary | ICD-10-CM

## 2017-11-02 DIAGNOSIS — I10 Essential (primary) hypertension: Secondary | ICD-10-CM

## 2017-11-02 DIAGNOSIS — R945 Abnormal results of liver function studies: Secondary | ICD-10-CM

## 2017-11-02 DIAGNOSIS — J4520 Mild intermittent asthma, uncomplicated: Secondary | ICD-10-CM | POA: Diagnosis not present

## 2017-11-02 MED ORDER — LOSARTAN POTASSIUM 25 MG PO TABS: 25.0000 mg | ORAL_TABLET | Freq: Every day | ORAL | 1 refills | Status: DC

## 2017-11-02 NOTE — Patient Instructions (Signed)
nasacort nasal spray - 2 sprays each nostril one time per day.  Do this in the evening.   

## 2017-11-02 NOTE — Progress Notes (Signed)
Patient ID: Chloe Moyer, female   DOB: 1951-06-30, 66 y.o.   MRN: [id number]   Subjective:    Patient ID: Chloe Moyer, female    DOB: 08/03/1951, 66 y.o.   MRN: [id number]  HPI  Patient here for a scheduled follow up.  She reports she is doing relatively well.  Trying to stay active.  No chest pain.  Breathing stable.  Has a history of chronic rhinosinusitis with nasal polyposis.  Sees ENT.  Using a sinus rinse and budesonide drops.  She is having some nasal congestion and drainage.  Used her husband's  flonase and it helped some.  Questioning if any other treatment options.  No chest congestion.  No acid reflux.  No abdominal pain.  Bowels moving.     Past Medical History:  Diagnosis Date  . Allergic rhinitis   . Arthritis   . Asthma   . Diverticulosis, sigmoid   . GERD (gastroesophageal reflux disease)   . History of hiatal hernia   . Hypertension 06-12-2005  . Ulcer    Past Surgical History:  Procedure Laterality Date  . BREAST BIOPSY Right 09/01/2017   Affirm Bx of 2 areas- path pending  . BREAST CYST ASPIRATION Left 1990  . CARDIAC CATHETERIZATION  July 2012  . CHOLECYSTECTOMY  1991  . LIPOMA EXCISION  4/99   Left flank area  . NASAL SINUS SURGERY  July 2012  . TUBAL LIGATION  1984   Family History  Problem Relation Age of Onset  . Emphysema Father        smoked  . Asthma Father   . Lung cancer Father        smoked  . Hypertension Father   . Hypertension Mother   . Breast cancer Maternal Grandmother 60  . Heart disease Paternal Grandfather        myocardial infarction  . Stroke Maternal Grandfather    Social History   Socioeconomic History  . Marital status: Married    Spouse name: None  . Number of children: 2  . Years of education: None  . Highest education level: None  Social Needs  . Financial resource strain: None  . Food insecurity - worry: None  . Food insecurity - inability: None  . Transportation needs - medical: None  .  Transportation needs - non-medical: None  Occupational History  . Occupation: Works at [employer]  Tobacco Use  . Smoking status: Never Smoker  . Smokeless tobacco: Never Used  Substance and Sexual Activity  . Alcohol use: Yes    Alcohol/week: 0.0 oz    Comment: occ wine with dinner  . Drug use: No  . Sexual activity: Yes  Other Topics Concern  . None  Social History Narrative  . None    Outpatient Encounter Medications as of 11/02/2017  Medication Sig  . albuterol (PROVENTIL HFA;VENTOLIN HFA) 108 (90 BASE) MCG/ACT inhaler Inhale 2 puffs using inhaler every 4-6 hours as needed for SOB/wheeze.  . budesonide (PULMICORT) 0.5 MG/2ML nebulizer solution Take 0.5 mg by nebulization 2 (two) times daily.  . budesonide-formoterol (SYMBICORT) 160-4.5 MCG/ACT inhaler Inhale 2 puffs into the lungs daily. as directed.  Marland Kitchen EPINEPHrine (EPIPEN 2-PAK) 0.3 mg/0.3 mL IJ SOAJ injection Inject 0.3 mLs (0.3 mg total) into the muscle once.  Marland Kitchen EPINEPHrine 0.3 mg/0.3 mL IJ SOAJ injection Inject 0.3 mg into the muscle once.  . hydrocortisone (ANUSOL-HC) 25 MG suppository Place 1 suppository (25 mg total) rectally 2 (two) times daily as needed  for hemorrhoids or itching.  . hydrOXYzine (ATARAX/VISTARIL) 25 MG tablet take 1 tablet by mouth once daily if needed  . Hypertonic Nasal Wash (SINUS RINSE NA) As directed three times daily   . losartan (COZAAR) 25 MG tablet Take 1 tablet (25 mg total) by mouth daily. Hold until pt request refill.  . montelukast (SINGULAIR) 10 MG tablet Take 10 mg by mouth at bedtime.  Marland Kitchen omalizumab (XOLAIR) 150 MG injection 150 mg every 30 (thirty) days.   . [DISCONTINUED] losartan (COZAAR) 25 MG tablet Take 1 tablet (25 mg total) by mouth daily. Hold until pt request refill.  . [DISCONTINUED] doxycycline (VIBRA-TABS) 100 MG tablet Take 1 tablet (100 mg total) by mouth 2 (two) times daily.  . [DISCONTINUED] predniSONE (DELTASONE) 5 MG tablet Take 5 mg by mouth daily.   No  facility-administered encounter medications on file as of 11/02/2017.     Review of Systems  Constitutional: Negative for appetite change and unexpected weight change.  HENT: Positive for congestion and postnasal drip. Negative for sinus pressure.   Respiratory: Negative for cough, chest tightness and shortness of breath.   Cardiovascular: Negative for chest pain, palpitations and leg swelling.  Gastrointestinal: Negative for abdominal pain, diarrhea, nausea and vomiting.  Genitourinary: Negative for difficulty urinating and dysuria.  Musculoskeletal: Negative for back pain and joint swelling.  Skin: Negative for color change and rash.  Neurological: Negative for dizziness, light-headedness and headaches.  Psychiatric/Behavioral: Negative for agitation and dysphoric mood.       Objective:     Pulse ox rechecked by me:  96-97%  Physical Exam  Constitutional: She appears well-developed and well-nourished. No distress.  HENT:  Nose: Nose normal.  Mouth/Throat: Oropharynx is clear and moist.  Neck: Neck supple. No thyromegaly present.  Cardiovascular: Normal rate and regular rhythm.  Pulmonary/Chest: Breath sounds normal. No respiratory distress. She has no wheezes.  Abdominal: Soft. Bowel sounds are normal. There is no tenderness.  Musculoskeletal: She exhibits no edema or tenderness.  Lymphadenopathy:    She has no cervical adenopathy.  Skin: No rash noted. No erythema.  Psychiatric: She has a normal mood and affect. Her behavior is normal.    BP 122/76   Pulse 91   Temp 97.6 F (36.4 C) (Oral)   Ht 5\' 4"  (1.626 m)   Wt 177 lb (80.3 kg)   LMP 11/03/1996   SpO2 93%   BMI 30.38 kg/m  Wt Readings from Last 3 Encounters:  11/02/17 177 lb (80.3 kg)  09/10/17 178 lb (80.7 kg)  07/17/17 178 lb 6.4 oz (80.9 kg)     Lab Results  Component Value Date   WBC 3.9 01/07/2017   HGB 13.0 01/07/2017   HCT 37.9 01/07/2017   PLT 164 01/07/2017   GLUCOSE 101 (H) 09/08/2017    CHOL 126 09/08/2017   TRIG 81.0 09/08/2017   HDL 39.20 09/08/2017   LDLCALC 71 09/08/2017   ALT 31 09/08/2017   AST 37 09/08/2017   NA 139 09/08/2017   K 3.9 09/08/2017   CL 104 09/08/2017   CREATININE 0.64 09/08/2017   BUN 9 09/08/2017   CO2 29 09/08/2017   TSH 3.72 10/14/2016   INR 0.94 01/07/2017   HGBA1C 5.8 09/08/2017    Mm Clip Placement Right  Result Date: 09/01/2017 CLINICAL DATA:  Post stereotactic guided biopsy of calcifications in the lower inner right breast and stereotactic guided biopsy of calcifications in the upper inner right breast. EXAM: DIAGNOSTIC RIGHT MAMMOGRAM POST ULTRASOUND BIOPSY  COMPARISON:  Previous exam(s). FINDINGS: Mammographic images were obtained following stereotactic guided biopsy of calcifications in the lower inner right breast and stereotactic guided biopsy of calcifications in the upper inner right breast. An X shaped biopsy marking clip is present at the site of the biopsied calcifications in the lower inner right breast with presence of a small hematoma. A coil shaped biopsy marking clip is present at the site of the biopsied calcifications in the upper inner right breast with a moderate sized hematoma present. IMPRESSION: 1. X shaped biopsy marking clip at site of biopsied calcifications in the lower inner right breast. 2. Coil shaped biopsy marking clip at site of biopsied calcifications in the upper inner right breast. Final Assessment: Post Procedure Mammograms for Marker Placement Electronically Signed   By: Everlean Alstrom M.D.   On: 09/01/2017 09:23   Mm Rt Breast Bx W Loc Dev Ea Ad Lesion Img Bx Spec Stereo Guide  Addendum Date: 09/03/2017   ADDENDUM REPORT: 09/03/2017 11:55 ADDENDUM: PATHOLOGY ADDENDUM: Pathology: A. Breast calcification, right lower inner quadrant - fibroadenomatoid change with calcifications. Negative for atypia and malignancy. Bi. Right breast calcification, right upper inner quadrant - fibroadenomatoid change with  calcifications. Negative for atypia and malignancy. Pathology concordant with imaging findings: Yes Recommendation: Return to screening mammography in 12 months. At the request of the patient, I spoke with her by telephone at 1132am on 09/03/17. She reports doing well after the biopsy with some persistent tenderness and bruising. Electronically Signed   By: Ammie Ferrier M.D.   On: 09/03/2017 11:55   Result Date: 09/03/2017 CLINICAL DATA:  66 year old female with indeterminate calcifications and the lower inner right breast and upper inner right breast. EXAM: RIGHT BREAST STEREOTACTIC CORE NEEDLE BIOPSY COMPARISON:  Previous exams. FINDINGS: The patient and I discussed the procedure of stereotactic-guided biopsy including benefits and alternatives. We discussed the high likelihood of a successful procedure. We discussed the risks of the procedure including infection, bleeding, tissue injury, clip migration, and inadequate sampling. Informed written consent was given. The usual time out protocol was performed immediately prior to the procedure. SITE 1: RIGHT BREAST LOWER INNER: CALCIFICATIONS: Using sterile technique and 1% Lidocaine as local anesthetic, under stereotactic guidance, a 9 gauge vacuum assisted device was used to perform core needle biopsy of the calcifications in the lower inner right breast using a medial to lateral approach. Specimen radiograph was performed showing the presence of calcifications. Specimens with calcifications are identified for pathology. Lesion quadrant: Lower inner At the conclusion of the procedure, and X shaped tissue marker clip was deployed into the biopsy cavity. SITE 2: RIGHT BREAST UPPER INNER: CALCIFICATIONS: Using sterile technique and 1% Lidocaine as local anesthetic, under stereotactic guidance, a 9 gauge vacuum assisted device was used to perform core needle biopsy of the calcifications in the upper inner right breast using a medial to lateral approach. Specimen  radiograph was performed showing the presence of calcifications. Specimens with calcifications are identified for pathology. Lesion quadrant: Upper inner At the conclusion of the procedure, a coil shaped tissue marker clip was deployed into the biopsy cavity. Follow-up 2-view mammogram was performed and dictated separately. IMPRESSION: 1. Stereotactic guided biopsy of calcifications in the lower inner right breast, at site of X shaped biopsy marking clip. 2. Stereotactic guided biopsy of calcifications in the upper inner right breast, at site of coil shaped biopsy marking clip. Electronically Signed: By: Everlean Alstrom M.D. On: 09/01/2017 09:05       Assessment & Plan:  Problem List Items Addressed This Visit    Abnormal liver function tests    S/p liver biopsy.  Follow liver function tests.  Being followed by GI.  Planning for f/u ultrasound first of year.        Allergic rhinitis    Followed by ENT and her allergist.  Continues saline rinses and budesonide drops as outlined.  Some congestion and drainage.  Felt flonase helped.  Trial of nasacort nasal spray as directed.  She will contact ENT/allergist for further recommendation.        Asthma    Breathing stable.       Hypertension    Blood pressure under good control.  Continue same medication regimen.  Follow pressures.  Follow metabolic panel.        Relevant Medications   losartan (COZAAR) 25 MG tablet   Leukopenia    Last white blood cell count wnl - 3.9.  Follow.         Other Visit Diagnoses    Medication refill       Relevant Medications   losartan (COZAAR) 25 MG tablet       Einar Pheasant, MD

## 2017-11-05 ENCOUNTER — Encounter: Payer: Self-pay | Admitting: Internal Medicine

## 2017-11-05 NOTE — Assessment & Plan Note (Addendum)
S/p liver biopsy.  Follow liver function tests.  Being followed by GI.  Planning for f/u ultrasound first of year.

## 2017-11-05 NOTE — Assessment & Plan Note (Signed)
Followed by ENT and her allergist.  Continues saline rinses and budesonide drops as outlined.  Some congestion and drainage.  Felt flonase helped.  Trial of nasacort nasal spray as directed.  She will contact ENT/allergist for further recommendation.

## 2017-11-05 NOTE — Assessment & Plan Note (Signed)
Last white blood cell count wnl - 3.9.  Follow.

## 2017-11-05 NOTE — Assessment & Plan Note (Signed)
Breathing stable.

## 2017-11-05 NOTE — Assessment & Plan Note (Signed)
Blood pressure under good control.  Continue same medication regimen.  Follow pressures.  Follow metabolic panel.   

## 2017-11-18 DIAGNOSIS — J4541 Moderate persistent asthma with (acute) exacerbation: Secondary | ICD-10-CM | POA: Diagnosis not present

## 2017-12-15 DIAGNOSIS — J4541 Moderate persistent asthma with (acute) exacerbation: Secondary | ICD-10-CM | POA: Diagnosis not present

## 2018-01-05 DIAGNOSIS — M79671 Pain in right foot: Secondary | ICD-10-CM | POA: Diagnosis not present

## 2018-01-05 DIAGNOSIS — M722 Plantar fascial fibromatosis: Secondary | ICD-10-CM | POA: Diagnosis not present

## 2018-01-19 ENCOUNTER — Ambulatory Visit (INDEPENDENT_AMBULATORY_CARE_PROVIDER_SITE_OTHER): Payer: PPO

## 2018-01-19 ENCOUNTER — Telehealth: Payer: Self-pay

## 2018-01-19 ENCOUNTER — Other Ambulatory Visit: Payer: Self-pay

## 2018-01-19 ENCOUNTER — Ambulatory Visit (INDEPENDENT_AMBULATORY_CARE_PROVIDER_SITE_OTHER): Payer: PPO | Admitting: Internal Medicine

## 2018-01-19 VITALS — BP 132/88 | HR 89 | Temp 98.0°F | Resp 20 | Wt 176.0 lb

## 2018-01-19 DIAGNOSIS — R059 Cough, unspecified: Secondary | ICD-10-CM

## 2018-01-19 DIAGNOSIS — R05 Cough: Secondary | ICD-10-CM

## 2018-01-19 DIAGNOSIS — J4520 Mild intermittent asthma, uncomplicated: Secondary | ICD-10-CM

## 2018-01-19 DIAGNOSIS — J449 Chronic obstructive pulmonary disease, unspecified: Secondary | ICD-10-CM | POA: Diagnosis not present

## 2018-01-19 DIAGNOSIS — R6889 Other general symptoms and signs: Secondary | ICD-10-CM | POA: Diagnosis not present

## 2018-01-19 LAB — POCT INFLUENZA A/B
INFLUENZA A, POC: NEGATIVE
INFLUENZA B, POC: NEGATIVE

## 2018-01-19 MED ORDER — PREDNISONE 10 MG PO TABS: ORAL_TABLET | ORAL | 0 refills | Status: DC

## 2018-01-19 MED ORDER — DOXYCYCLINE HYCLATE 100 MG PO TABS: 100.0000 mg | ORAL_TABLET | Freq: Two times a day (BID) | ORAL | 0 refills | Status: DC

## 2018-01-19 NOTE — Telephone Encounter (Signed)
Patient scheduled and aware to come in early for chest x-ray. Orders have been placed.

## 2018-01-19 NOTE — Telephone Encounter (Signed)
Called patient. She is still having persistent cough and congestion. Patient stated she could come in today at 4:30. Patient also requested a chest x-ray, if this is ok I can have her come in and do the x-ray prior to visit.

## 2018-01-19 NOTE — Patient Instructions (Signed)
Take a probiotic daily while you are on the antibiotics and for two weeks after completing the antibiotics.    Examples of probiotics:  Florastor, culturelle or align.

## 2018-01-19 NOTE — Telephone Encounter (Signed)
Ok

## 2018-01-19 NOTE — Progress Notes (Signed)
Patient ID: Chloe Moyer, female   DOB: 01-Oct-1951, 67 y.o.   MRN: [id number]   Subjective:    Patient ID: Chloe Moyer, female    DOB: 06-13-1951, 67 y.o.   MRN: [id number]  HPI  Patient here as a work in with concerns regarding increased cough and congestion.  States symptoms started 01/07/18.  Started with "cold" symptoms and chills.  Runny nose.  Sneezing.  Progressed to increased cough.  Called her pulmonologist.  Was given zpak and prednisone.  Helped some, but still with increased cough.  Increased nasal congestion - productive of green mucus.  Ears ringing.  Increased cough with congestion.  Using symbicort bid.  Some previous diarrhea.  Eating yogurt.  Better now.     Past Medical History:  Diagnosis Date  . Allergic rhinitis   . Arthritis   . Asthma   . Diverticulosis, sigmoid   . GERD (gastroesophageal reflux disease)   . History of hiatal hernia   . Hypertension 06-12-2005  . Ulcer    Past Surgical History:  Procedure Laterality Date  . BREAST BIOPSY Right 09/01/2017   Affirm Bx of 2 areas- path pending  . BREAST CYST ASPIRATION Left 1990  . CARDIAC CATHETERIZATION  July 2012  . CHOLECYSTECTOMY  1991  . LIPOMA EXCISION  4/99   Left flank area  . NASAL SINUS SURGERY  July 2012  . TUBAL LIGATION  1984   Family History  Problem Relation Age of Onset  . Emphysema Father        smoked  . Asthma Father   . Lung cancer Father        smoked  . Hypertension Father   . Hypertension Mother   . Breast cancer Maternal Grandmother 60  . Heart disease Paternal Grandfather        myocardial infarction  . Stroke Maternal Grandfather    Social History   Socioeconomic History  . Marital status: Married    Spouse name: None  . Number of children: 2  . Years of education: None  . Highest education level: None  Social Needs  . Financial resource strain: None  . Food insecurity - worry: None  . Food insecurity - inability: None  . Transportation needs - medical:  None  . Transportation needs - non-medical: None  Occupational History  . Occupation: Works at [employer]  Tobacco Use  . Smoking status: Never Smoker  . Smokeless tobacco: Never Used  Substance and Sexual Activity  . Alcohol use: Yes    Alcohol/week: 0.0 oz    Comment: occ wine with dinner  . Drug use: No  . Sexual activity: Yes  Other Topics Concern  . None  Social History Narrative  . None    Outpatient Encounter Medications as of 01/19/2018  Medication Sig  . albuterol (PROVENTIL HFA;VENTOLIN HFA) 108 (90 BASE) MCG/ACT inhaler Inhale 2 puffs using inhaler every 4-6 hours as needed for SOB/wheeze.  . budesonide (PULMICORT) 0.5 MG/2ML nebulizer solution Take 0.5 mg by nebulization 2 (two) times daily.  . budesonide-formoterol (SYMBICORT) 160-4.5 MCG/ACT inhaler Inhale 2 puffs into the lungs daily. as directed.  Marland Kitchen EPINEPHrine (EPIPEN 2-PAK) 0.3 mg/0.3 mL IJ SOAJ injection Inject 0.3 mLs (0.3 mg total) into the muscle once.  Marland Kitchen EPINEPHrine 0.3 mg/0.3 mL IJ SOAJ injection Inject 0.3 mg into the muscle once.  . hydrocortisone (ANUSOL-HC) 25 MG suppository Place 1 suppository (25 mg total) rectally 2 (two) times daily as needed for hemorrhoids or itching.  Marland Kitchen  hydrOXYzine (ATARAX/VISTARIL) 25 MG tablet take 1 tablet by mouth once daily if needed  . Hypertonic Nasal Wash (SINUS RINSE NA) As directed three times daily   . losartan (COZAAR) 25 MG tablet Take 1 tablet (25 mg total) by mouth daily. Hold until pt request refill.  . montelukast (SINGULAIR) 10 MG tablet Take 10 mg by mouth at bedtime.  Marland Kitchen omalizumab (XOLAIR) 150 MG injection 150 mg every 30 (thirty) days.   Marland Kitchen doxycycline (VIBRA-TABS) 100 MG tablet Take 1 tablet (100 mg total) by mouth 2 (two) times daily.  . predniSONE (DELTASONE) 10 MG tablet Take 4 tablets x 1 day and then decrease by 1/2 tablet per day until down to zero mg.   No facility-administered encounter medications on file as of 01/19/2018.     Review of Systems    Constitutional: Positive for chills. Negative for appetite change.  HENT: Positive for congestion, postnasal drip and sinus pressure.   Respiratory: Positive for cough. Negative for chest tightness and shortness of breath.   Cardiovascular: Negative for chest pain and leg swelling.  Gastrointestinal: Positive for diarrhea. Negative for nausea and vomiting.  Musculoskeletal: Negative for joint swelling and myalgias.  Skin: Negative for color change and rash.  Neurological: Negative for dizziness, light-headedness and headaches.  Psychiatric/Behavioral: Negative for agitation and dysphoric mood.       Objective:    Physical Exam  Constitutional: She appears well-developed and well-nourished. No distress.  HENT:  Mouth/Throat: Oropharynx is clear and moist.  Increased sinus pressure to palpation.  Nares- with slightly erythematous turbinates.    Neck: Neck supple.  Cardiovascular: Normal rate and regular rhythm.  Pulmonary/Chest: Breath sounds normal. No respiratory distress. She has no wheezes.  Increased cough with forced expiration.    Lymphadenopathy:    She has no cervical adenopathy.  Skin: No rash noted. No erythema.  Psychiatric: She has a normal mood and affect. Her behavior is normal.    BP 132/88 (BP Location: Left Arm, Patient Position: Sitting, Cuff Size: Normal)   Pulse 89   Temp 98 F (36.7 C) (Oral)   Resp 20   Wt 176 lb (79.8 kg)   LMP 11/03/1996   SpO2 94%   BMI 30.21 kg/m  Wt Readings from Last 3 Encounters:  01/19/18 176 lb (79.8 kg)  11/02/17 177 lb (80.3 kg)  09/10/17 178 lb (80.7 kg)     Lab Results  Component Value Date   WBC 3.9 01/07/2017   HGB 13.0 01/07/2017   HCT 37.9 01/07/2017   PLT 164 01/07/2017   GLUCOSE 101 (H) 09/08/2017   CHOL 126 09/08/2017   TRIG 81.0 09/08/2017   HDL 39.20 09/08/2017   LDLCALC 71 09/08/2017   ALT 31 09/08/2017   AST 37 09/08/2017   NA 139 09/08/2017   K 3.9 09/08/2017   CL 104 09/08/2017   CREATININE  0.64 09/08/2017   BUN 9 09/08/2017   CO2 29 09/08/2017   TSH 3.72 10/14/2016   INR 0.94 01/07/2017   HGBA1C 5.8 09/08/2017       Assessment & Plan:   Problem List Items Addressed This Visit    Asthma    Treat infection as outlined.  Continue inhalers.        Relevant Medications   predniSONE (DELTASONE) 10 MG tablet   Cough    With increased cough and congestion as outlined.  Sinus infection/URI. Treat with doxycycline.  Probiotic as directed.  Saline nasal spray and nasacort as directed.  Mucinex/robitussin  as directed.  Continue inhalers.  Prednisone taper as directed.  Follow.         Other Visit Diagnoses    Flu-like symptoms    -  Primary   Flu swab negative.  Treat for bacterial infection.     Relevant Orders   POCT Influenza A/B (Completed)       Einar Pheasant, MD

## 2018-01-20 ENCOUNTER — Encounter: Payer: Self-pay | Admitting: Internal Medicine

## 2018-01-23 ENCOUNTER — Encounter: Payer: Self-pay | Admitting: Internal Medicine

## 2018-01-23 NOTE — Assessment & Plan Note (Signed)
With increased cough and congestion as outlined.  Sinus infection/URI. Treat with doxycycline.  Probiotic as directed.  Saline nasal spray and nasacort as directed.  Mucinex/robitussin as directed.  Continue inhalers.  Prednisone taper as directed.  Follow.

## 2018-01-23 NOTE — Assessment & Plan Note (Signed)
Treat infection as outlined.  Continue inhalers.

## 2018-01-26 DIAGNOSIS — M79671 Pain in right foot: Secondary | ICD-10-CM | POA: Diagnosis not present

## 2018-01-26 DIAGNOSIS — M722 Plantar fascial fibromatosis: Secondary | ICD-10-CM | POA: Diagnosis not present

## 2018-02-01 DIAGNOSIS — J4541 Moderate persistent asthma with (acute) exacerbation: Secondary | ICD-10-CM | POA: Diagnosis not present

## 2018-02-10 DIAGNOSIS — H2513 Age-related nuclear cataract, bilateral: Secondary | ICD-10-CM | POA: Diagnosis not present

## 2018-02-15 ENCOUNTER — Other Ambulatory Visit (INDEPENDENT_AMBULATORY_CARE_PROVIDER_SITE_OTHER): Payer: PPO

## 2018-02-15 DIAGNOSIS — R945 Abnormal results of liver function studies: Secondary | ICD-10-CM

## 2018-02-15 DIAGNOSIS — I10 Essential (primary) hypertension: Secondary | ICD-10-CM

## 2018-02-15 DIAGNOSIS — D72819 Decreased white blood cell count, unspecified: Secondary | ICD-10-CM | POA: Diagnosis not present

## 2018-02-15 DIAGNOSIS — R7989 Other specified abnormal findings of blood chemistry: Secondary | ICD-10-CM

## 2018-02-15 DIAGNOSIS — R739 Hyperglycemia, unspecified: Secondary | ICD-10-CM

## 2018-02-15 LAB — BASIC METABOLIC PANEL
BUN: 10 mg/dL (ref 6–23)
CO2: 27 meq/L (ref 19–32)
Calcium: 9.6 mg/dL (ref 8.4–10.5)
Chloride: 107 mEq/L (ref 96–112)
Creatinine, Ser: 0.72 mg/dL (ref 0.40–1.20)
GFR: 85.53 mL/min (ref >60.00)
GLUCOSE: 103 mg/dL — AB (ref 70–99)
POTASSIUM: 3.9 meq/L (ref 3.5–5.1)
Sodium: 142 mEq/L (ref 135–145)

## 2018-02-15 LAB — LIPID PANEL
CHOL/HDL RATIO: 3
Cholesterol: 136 mg/dL (ref 0–200)
HDL: 39.7 mg/dL (ref >39.00)
LDL CALC: 80 mg/dL (ref 0–99)
NonHDL: 96.16
Triglycerides: 80 mg/dL (ref 0.0–149.0)
VLDL: 16 mg/dL (ref 0.0–40.0)

## 2018-02-15 LAB — CBC WITH DIFFERENTIAL/PLATELET
BASOS ABS: 0 10*3/uL (ref 0.0–0.1)
Basophils Relative: 1.2 % (ref 0.0–3.0)
EOS ABS: 0.2 10*3/uL (ref 0.0–0.7)
Eosinophils Relative: 5.7 % — ABNORMAL HIGH (ref 0.0–5.0)
HCT: 38.5 % (ref 36.0–46.0)
Hemoglobin: 12.9 g/dL (ref 12.0–15.0)
LYMPHS PCT: 45.4 % (ref 12.0–46.0)
Lymphs Abs: 1.6 10*3/uL (ref 0.7–4.0)
MCHC: 33.6 g/dL (ref 30.0–36.0)
MCV: 87.8 fl (ref 78.0–100.0)
Monocytes Absolute: 0.4 10*3/uL (ref 0.1–1.0)
Monocytes Relative: 10.5 % (ref 3.0–12.0)
Neutro Abs: 1.3 10*3/uL — ABNORMAL LOW (ref 1.4–7.7)
Neutrophils Relative %: 37.2 % — ABNORMAL LOW (ref 43.0–77.0)
Platelets: 181 10*3/uL (ref 150.0–400.0)
RBC: 4.39 Mil/uL (ref 3.87–5.11)
RDW: 14.7 % (ref 11.5–15.5)
WBC: 3.5 10*3/uL — AB (ref 4.0–10.5)

## 2018-02-15 LAB — HEPATIC FUNCTION PANEL
ALBUMIN: 3.9 g/dL (ref 3.5–5.2)
ALK PHOS: 44 U/L (ref 39–117)
ALT: 25 U/L (ref 0–35)
AST: 32 U/L (ref 0–37)
Bilirubin, Direct: 0.2 mg/dL (ref 0.0–0.3)
TOTAL PROTEIN: 7.1 g/dL (ref 6.0–8.3)
Total Bilirubin: 0.7 mg/dL (ref 0.2–1.2)

## 2018-02-15 LAB — HEMOGLOBIN A1C: HEMOGLOBIN A1C: 6.1 % (ref 4.6–6.5)

## 2018-02-15 LAB — TSH: TSH: 2.87 u[IU]/mL (ref 0.35–4.50)

## 2018-02-16 ENCOUNTER — Encounter: Payer: Self-pay | Admitting: Internal Medicine

## 2018-02-17 ENCOUNTER — Encounter: Payer: Self-pay | Admitting: Internal Medicine

## 2018-02-17 ENCOUNTER — Ambulatory Visit (INDEPENDENT_AMBULATORY_CARE_PROVIDER_SITE_OTHER): Payer: PPO | Admitting: Internal Medicine

## 2018-02-17 ENCOUNTER — Telehealth: Payer: Self-pay

## 2018-02-17 ENCOUNTER — Ambulatory Visit
Admission: RE | Admit: 2018-02-17 | Discharge: 2018-02-17 | Disposition: A | Discharge status: Home / Self Care | Payer: PPO | Source: Ambulatory Visit | Attending: Internal Medicine | Admitting: Internal Medicine

## 2018-02-17 VITALS — BP 130/78 | HR 79 | Temp 97.8°F | Resp 16 | Wt 178.0 lb

## 2018-02-17 DIAGNOSIS — M7989 Other specified soft tissue disorders: Secondary | ICD-10-CM

## 2018-02-17 DIAGNOSIS — Z0000 Encounter for general adult medical examination without abnormal findings: Secondary | ICD-10-CM | POA: Diagnosis not present

## 2018-02-17 DIAGNOSIS — R739 Hyperglycemia, unspecified: Secondary | ICD-10-CM

## 2018-02-17 DIAGNOSIS — M7122 Synovial cyst of popliteal space [Baker], left knee: Secondary | ICD-10-CM | POA: Diagnosis not present

## 2018-02-17 DIAGNOSIS — I10 Essential (primary) hypertension: Secondary | ICD-10-CM

## 2018-02-17 DIAGNOSIS — D72819 Decreased white blood cell count, unspecified: Secondary | ICD-10-CM

## 2018-02-17 DIAGNOSIS — M25562 Pain in left knee: Secondary | ICD-10-CM

## 2018-02-17 DIAGNOSIS — R7989 Other specified abnormal findings of blood chemistry: Secondary | ICD-10-CM

## 2018-02-17 DIAGNOSIS — Z760 Encounter for issue of repeat prescription: Secondary | ICD-10-CM

## 2018-02-17 DIAGNOSIS — M79605 Pain in left leg: Secondary | ICD-10-CM | POA: Insufficient documentation

## 2018-02-17 DIAGNOSIS — R945 Abnormal results of liver function studies: Secondary | ICD-10-CM

## 2018-02-17 DIAGNOSIS — J4520 Mild intermittent asthma, uncomplicated: Secondary | ICD-10-CM

## 2018-02-17 MED ORDER — HYDROXYZINE HCL 25 MG PO TABS: ORAL_TABLET | ORAL | 1 refills | Status: DC

## 2018-02-17 MED ORDER — LOSARTAN POTASSIUM 25 MG PO TABS: 25.0000 mg | ORAL_TABLET | Freq: Every day | ORAL | 1 refills | Status: DC

## 2018-02-17 NOTE — Telephone Encounter (Signed)
Please call and notify pt no clot.  Does have a Bakers cyst (we discussed this at her appt).  Given the cyst and increased pain, I would like to refer her to ortho for further evaluation.  If agreeable, let me know and I will place the order for the referral.  Also, let me know if she has a preference of who she sees.

## 2018-02-17 NOTE — Progress Notes (Signed)
Patient ID: BRINLEIGH TEW, female   DOB: 06/18/1951, 67 y.o.   MRN: [id number]   Subjective:    Patient ID: PENINA REISNER, female    DOB: January 26, 1951, 67 y.o.   MRN: [id number]  HPI  Patient with past history of asthma, GERD and hypertension.  She comes in today to follow up on these issues as well as for a complete physical exam.  She reports that she has noticed increased pain and swelling in her left knee/leg.  She was accidentally kicked prior to the swelling.  Had been working.  Noticed increased pain and swelling.  Fullness in the popliteal region.  Rode to the beach and back this weekend.  No chest pain.  No sob.  No acid reflux.  No abdominal pain.  Bowels moving.      Past Medical History:  Diagnosis Date  . Allergic rhinitis   . Arthritis   . Asthma   . Diverticulosis, sigmoid   . GERD (gastroesophageal reflux disease)   . History of hiatal hernia   . Hypertension 06-12-2005  . Ulcer    Past Surgical History:  Procedure Laterality Date  . BREAST BIOPSY Right 09/01/2017   Affirm Bx of 2 areas- path pending  . BREAST CYST ASPIRATION Left 1990  . CARDIAC CATHETERIZATION  July 2012  . CHOLECYSTECTOMY  1991  . LIPOMA EXCISION  4/99   Left flank area  . NASAL SINUS SURGERY  July 2012  . TUBAL LIGATION  1984   Family History  Problem Relation Age of Onset  . Emphysema Father        smoked  . Asthma Father   . Lung cancer Father        smoked  . Hypertension Father   . Hypertension Mother   . Breast cancer Maternal Grandmother 60  . Heart disease Paternal Grandfather        myocardial infarction  . Stroke Maternal Grandfather    Social History   Socioeconomic History  . Marital status: Married    Spouse name: Not on file  . Number of children: 2  . Years of education: Not on file  . Highest education level: Not on file  Occupational History  . Occupation: Works at [employer]  Social Needs  . Financial resource strain: Not on file  . Food  insecurity:    Worry: Not on file    Inability: Not on file  . Transportation needs:    Medical: Not on file    Non-medical: Not on file  Tobacco Use  . Smoking status: Never Smoker  . Smokeless tobacco: Never Used  Substance and Sexual Activity  . Alcohol use: Yes    Alcohol/week: 0.0 oz    Comment: occ wine with dinner  . Drug use: No  . Sexual activity: Yes  Lifestyle  . Physical activity:    Days per week: Not on file    Minutes per session: Not on file  . Stress: Not on file  Relationships  . Social connections:    Talks on phone: Not on file    Gets together: Not on file    Attends religious service: Not on file    Active member of club or organization: Not on file    Attends meetings of clubs or organizations: Not on file    Relationship status: Not on file  Other Topics Concern  . Not on file  Social History Narrative  . Not on file    Outpatient  Encounter Medications as of 02/17/2018  Medication Sig  . albuterol (PROVENTIL HFA;VENTOLIN HFA) 108 (90 BASE) MCG/ACT inhaler Inhale 2 puffs using inhaler every 4-6 hours as needed for SOB/wheeze.  . budesonide (PULMICORT) 0.5 MG/2ML nebulizer solution Take 0.5 mg by nebulization 2 (two) times daily.  . budesonide-formoterol (SYMBICORT) 160-4.5 MCG/ACT inhaler Inhale 2 puffs into the lungs daily. as directed.  Marland Kitchen EPINEPHrine (EPIPEN 2-PAK) 0.3 mg/0.3 mL IJ SOAJ injection Inject 0.3 mLs (0.3 mg total) into the muscle once.  . hydrocortisone (ANUSOL-HC) 25 MG suppository Place 1 suppository (25 mg total) rectally 2 (two) times daily as needed for hemorrhoids or itching.  . hydrOXYzine (ATARAX/VISTARIL) 25 MG tablet take 1 tablet by mouth once daily if needed  . Hypertonic Nasal Wash (SINUS RINSE NA) As directed three times daily   . losartan (COZAAR) 25 MG tablet Take 1 tablet (25 mg total) by mouth daily. Hold until pt request refill.  . montelukast (SINGULAIR) 10 MG tablet Take 10 mg by mouth at bedtime.  Marland Kitchen omalizumab  (XOLAIR) 150 MG injection 150 mg every 30 (thirty) days.   . [DISCONTINUED] doxycycline (VIBRA-TABS) 100 MG tablet Take 1 tablet (100 mg total) by mouth 2 (two) times daily.  . [DISCONTINUED] EPINEPHrine 0.3 mg/0.3 mL IJ SOAJ injection Inject 0.3 mg into the muscle once.  . [DISCONTINUED] hydrOXYzine (ATARAX/VISTARIL) 25 MG tablet take 1 tablet by mouth once daily if needed  . [DISCONTINUED] losartan (COZAAR) 25 MG tablet Take 1 tablet (25 mg total) by mouth daily. Hold until pt request refill.  . [DISCONTINUED] predniSONE (DELTASONE) 10 MG tablet Take 4 tablets x 1 day and then decrease by 1/2 tablet per day until down to zero mg.   No facility-administered encounter medications on file as of 02/17/2018.     Review of Systems  Constitutional: Negative for appetite change and unexpected weight change.  HENT: Negative for congestion and sinus pressure.   Eyes: Negative for pain and visual disturbance.  Respiratory: Negative for cough, chest tightness and shortness of breath.   Cardiovascular: Positive for leg swelling. Negative for chest pain and palpitations.  Gastrointestinal: Negative for abdominal pain, diarrhea, nausea and vomiting.  Genitourinary: Negative for difficulty urinating and dysuria.  Musculoskeletal: Negative for joint swelling and myalgias.  Skin: Negative for color change and rash.  Neurological: Negative for dizziness, light-headedness and headaches.  Hematological: Negative for adenopathy. Does not bruise/bleed easily.  Psychiatric/Behavioral: Negative for agitation and dysphoric mood.       Objective:     Blood pressure rechecked by me:  130/78  Physical Exam  Constitutional: She is oriented to person, place, and time. She appears well-developed and well-nourished. No distress.  HENT:  Nose: Nose normal.  Mouth/Throat: Oropharynx is clear and moist.  Eyes: Right eye exhibits no discharge. Left eye exhibits no discharge. No scleral icterus.  Neck: Neck supple. No  thyromegaly present.  Cardiovascular: Normal rate and regular rhythm.  Pulmonary/Chest: Breath sounds normal. No accessory muscle usage. No tachypnea. No respiratory distress. She has no decreased breath sounds. She has no wheezes. She has no rhonchi. Right breast exhibits no inverted nipple, no mass, no nipple discharge and no tenderness (no axillary adenopathy). Left breast exhibits no inverted nipple, no mass, no nipple discharge and no tenderness (no axilarry adenopathy).  Abdominal: Soft. Bowel sounds are normal. There is no tenderness.  Musculoskeletal:  Increase fullness - left popliteal region.  Increased pain with palpation.  Some increased fullness with flexion of her left leg.  Lymphadenopathy:    She has no cervical adenopathy.  Neurological: She is alert and oriented to person, place, and time.  Skin: Skin is warm. No rash noted. No erythema.  Psychiatric: She has a normal mood and affect. Her behavior is normal.    BP 130/78   Pulse 79   Temp 97.8 F (36.6 C) (Oral)   Resp 16   Wt 178 lb (80.7 kg)   LMP 11/03/1996   SpO2 98%   BMI 30.55 kg/m  Wt Readings from Last 3 Encounters:  02/17/18 178 lb (80.7 kg)  01/19/18 176 lb (79.8 kg)  11/02/17 177 lb (80.3 kg)     Lab Results  Component Value Date   WBC 3.5 (L) 02/15/2018   HGB 12.9 02/15/2018   HCT 38.5 02/15/2018   PLT 181.0 02/15/2018   GLUCOSE 103 (H) 02/15/2018   CHOL 136 02/15/2018   TRIG 80.0 02/15/2018   HDL 39.70 02/15/2018   LDLCALC 80 02/15/2018   ALT 25 02/15/2018   AST 32 02/15/2018   NA 142 02/15/2018   K 3.9 02/15/2018   CL 107 02/15/2018   CREATININE 0.72 02/15/2018   BUN 10 02/15/2018   CO2 27 02/15/2018   TSH 2.87 02/15/2018   INR 0.94 01/07/2017   HGBA1C 6.1 02/15/2018    Mm Clip Placement Right  Result Date: 09/01/2017 CLINICAL DATA:  Post stereotactic guided biopsy of calcifications in the lower inner right breast and stereotactic guided biopsy of calcifications in the upper  inner right breast. EXAM: DIAGNOSTIC RIGHT MAMMOGRAM POST ULTRASOUND BIOPSY COMPARISON:  Previous exam(s). FINDINGS: Mammographic images were obtained following stereotactic guided biopsy of calcifications in the lower inner right breast and stereotactic guided biopsy of calcifications in the upper inner right breast. An X shaped biopsy marking clip is present at the site of the biopsied calcifications in the lower inner right breast with presence of a small hematoma. A coil shaped biopsy marking clip is present at the site of the biopsied calcifications in the upper inner right breast with a moderate sized hematoma present. IMPRESSION: 1. X shaped biopsy marking clip at site of biopsied calcifications in the lower inner right breast. 2. Coil shaped biopsy marking clip at site of biopsied calcifications in the upper inner right breast. Final Assessment: Post Procedure Mammograms for Marker Placement Electronically Signed   By: Everlean Alstrom M.D.   On: 09/01/2017 09:23   Mm Rt Breast Bx W Loc Dev Ea Ad Lesion Img Bx Spec Stereo Guide  Addendum Date: 09/03/2017   ADDENDUM REPORT: 09/03/2017 11:55 ADDENDUM: PATHOLOGY ADDENDUM: Pathology: A. Breast calcification, right lower inner quadrant - fibroadenomatoid change with calcifications. Negative for atypia and malignancy. Bi. Right breast calcification, right upper inner quadrant - fibroadenomatoid change with calcifications. Negative for atypia and malignancy. Pathology concordant with imaging findings: Yes Recommendation: Return to screening mammography in 12 months. At the request of the patient, I spoke with her by telephone at 1132am on 09/03/17. She reports doing well after the biopsy with some persistent tenderness and bruising. Electronically Signed   By: Ammie Ferrier M.D.   On: 09/03/2017 11:55   Result Date: 09/03/2017 CLINICAL DATA:  67 year old female with indeterminate calcifications and the lower inner right breast and upper inner right  breast. EXAM: RIGHT BREAST STEREOTACTIC CORE NEEDLE BIOPSY COMPARISON:  Previous exams. FINDINGS: The patient and I discussed the procedure of stereotactic-guided biopsy including benefits and alternatives. We discussed the high likelihood of a successful procedure. We discussed the risks of  the procedure including infection, bleeding, tissue injury, clip migration, and inadequate sampling. Informed written consent was given. The usual time out protocol was performed immediately prior to the procedure. SITE 1: RIGHT BREAST LOWER INNER: CALCIFICATIONS: Using sterile technique and 1% Lidocaine as local anesthetic, under stereotactic guidance, a 9 gauge vacuum assisted device was used to perform core needle biopsy of the calcifications in the lower inner right breast using a medial to lateral approach. Specimen radiograph was performed showing the presence of calcifications. Specimens with calcifications are identified for pathology. Lesion quadrant: Lower inner At the conclusion of the procedure, and X shaped tissue marker clip was deployed into the biopsy cavity. SITE 2: RIGHT BREAST UPPER INNER: CALCIFICATIONS: Using sterile technique and 1% Lidocaine as local anesthetic, under stereotactic guidance, a 9 gauge vacuum assisted device was used to perform core needle biopsy of the calcifications in the upper inner right breast using a medial to lateral approach. Specimen radiograph was performed showing the presence of calcifications. Specimens with calcifications are identified for pathology. Lesion quadrant: Upper inner At the conclusion of the procedure, a coil shaped tissue marker clip was deployed into the biopsy cavity. Follow-up 2-view mammogram was performed and dictated separately. IMPRESSION: 1. Stereotactic guided biopsy of calcifications in the lower inner right breast, at site of X shaped biopsy marking clip. 2. Stereotactic guided biopsy of calcifications in the upper inner right breast, at site of coil  shaped biopsy marking clip. Electronically Signed: By: Everlean Alstrom M.D. On: 09/01/2017 09:05       Assessment & Plan:   Problem List Items Addressed This Visit    Abnormal liver function tests    S/p liver biopsy.  Followed by GI.  Recent liver function tests are wnl.       Relevant Orders   Hepatic function panel   Asthma    Breathing stable.        Health care maintenance    Physical today 02/17/18.  Colonoscopy 03/2014.  Recommended f/u colonoscopy in 5 years.  Mammogram 08/2017.  S/p biopsy.  Recommended f/u in 12 months.        Hyperglycemia    Low carb diet and exercise.  Follow met b and a1c.  Recent a1c 6.1.       Relevant Orders   Hemoglobin A1c   Hypertension    Blood pressure on recheck improved.  Continue same medication regimen.  Follow pressures. Follow metabolic panel.        Relevant Medications   losartan (COZAAR) 25 MG tablet   Other Relevant Orders   Basic metabolic panel   Left leg pain    Left leg pain and swelling as outlined.  Question of Baker's cyst.  Recent swelling with recent trip, will schedule lower extremity ultrasound to confirm no DVT.  May need ortho referral.        Leukopenia    Follow cbc.  White count has been stable.       Relevant Orders   CBC with Differential/Platelet   Lipid panel    Other Visit Diagnoses    Left leg swelling    -  Primary   Relevant Orders   US Venous Img Lower Unilateral Left (Completed)   Medication refill       Relevant Medications   losartan (COZAAR) 25 MG tablet       Einar Pheasant, MD

## 2018-02-17 NOTE — Telephone Encounter (Signed)
[facility] Ul[facility]ound called report for pt. She is negative for DVT and has a Baker's cyst behind the left knee measuring 3.2 cm x 2.9 cm x 0.7 cm.  Mallory from [place] stated that she would give results to patient and let her go. Advised we would call with additional info for patient if it was needed.

## 2018-02-17 NOTE — Assessment & Plan Note (Signed)
Physical today 02/17/18.  Colonoscopy 03/2014.  Recommended f/u colonoscopy in 5 years.  Mammogram 08/2017.  S/p biopsy.  Recommended f/u in 12 months.

## 2018-02-17 NOTE — Telephone Encounter (Signed)
She would like to be referred to Marylee Floras who is the PA at [facility]. She is going on a cruise in 18 days so if he does not have anything soon she is ok to see someone else at [facility].

## 2018-02-18 NOTE — Telephone Encounter (Signed)
Copied from [place] [id number]. Topic: Referral - Status >> Feb 18, 2018  2:16 PM Rutherford Nail, NT wrote: Reason for CRM: Patient calling to check on the status of a referral to Ellard Artis, Orthopedic. She would like [place] to call her with an update.

## 2018-02-18 NOTE — Telephone Encounter (Signed)
Order placed for ortho referral.  Pt going on a cruise 03/04/18.  Would like appt asap.  Wants to see Vance Peper at [facility].  Will see someone else if not able to get in soon with [facility].   Thanks.

## 2018-02-21 ENCOUNTER — Encounter: Payer: Self-pay | Admitting: Internal Medicine

## 2018-02-21 DIAGNOSIS — M79605 Pain in left leg: Secondary | ICD-10-CM | POA: Insufficient documentation

## 2018-02-21 DIAGNOSIS — R739 Hyperglycemia, unspecified: Secondary | ICD-10-CM | POA: Insufficient documentation

## 2018-02-21 NOTE — Assessment & Plan Note (Signed)
Left leg pain and swelling as outlined.  Question of Baker's cyst.  Recent swelling with recent trip, will schedule lower extremity ultrasound to confirm no DVT.  May need ortho referral.

## 2018-02-21 NOTE — Assessment & Plan Note (Signed)
Low carb diet and exercise.  Follow met b and a1c.  Recent a1c 6.1.   

## 2018-02-21 NOTE — Assessment & Plan Note (Signed)
Follow cbc.  White count has been stable.  

## 2018-02-21 NOTE — Assessment & Plan Note (Signed)
S/p liver biopsy.  Followed by GI.  Recent liver function tests are wnl.

## 2018-02-21 NOTE — Assessment & Plan Note (Signed)
Breathing stable.

## 2018-02-21 NOTE — Assessment & Plan Note (Signed)
Blood pressure on recheck improved.  Continue same medication regimen.  Follow pressures.  Follow metabolic panel.   

## 2018-02-23 DIAGNOSIS — M7122 Synovial cyst of popliteal space [Baker], left knee: Secondary | ICD-10-CM | POA: Diagnosis not present

## 2018-02-23 DIAGNOSIS — M1712 Unilateral primary osteoarthritis, left knee: Secondary | ICD-10-CM | POA: Diagnosis not present

## 2018-02-23 DIAGNOSIS — M25562 Pain in left knee: Secondary | ICD-10-CM | POA: Diagnosis not present

## 2018-02-23 DIAGNOSIS — M25462 Effusion, left knee: Secondary | ICD-10-CM | POA: Diagnosis not present

## 2018-02-25 DIAGNOSIS — J329 Chronic sinusitis, unspecified: Secondary | ICD-10-CM | POA: Diagnosis not present

## 2018-02-25 DIAGNOSIS — J4540 Moderate persistent asthma, uncomplicated: Secondary | ICD-10-CM | POA: Diagnosis not present

## 2018-02-25 DIAGNOSIS — Z889 Allergy status to unspecified drugs, medicaments and biological substances status: Secondary | ICD-10-CM | POA: Diagnosis not present

## 2018-03-01 DIAGNOSIS — J4541 Moderate persistent asthma with (acute) exacerbation: Secondary | ICD-10-CM | POA: Diagnosis not present

## 2018-03-31 DIAGNOSIS — J4541 Moderate persistent asthma with (acute) exacerbation: Secondary | ICD-10-CM | POA: Diagnosis not present

## 2018-04-26 DIAGNOSIS — M722 Plantar fascial fibromatosis: Secondary | ICD-10-CM | POA: Diagnosis not present

## 2018-04-26 DIAGNOSIS — M79671 Pain in right foot: Secondary | ICD-10-CM | POA: Diagnosis not present

## 2018-04-27 DIAGNOSIS — L578 Other skin changes due to chronic exposure to nonionizing radiation: Secondary | ICD-10-CM | POA: Diagnosis not present

## 2018-04-28 DIAGNOSIS — J4541 Moderate persistent asthma with (acute) exacerbation: Secondary | ICD-10-CM | POA: Diagnosis not present

## 2018-05-06 DIAGNOSIS — Z6828 Body mass index (BMI) 28.0-28.9, adult: Secondary | ICD-10-CM | POA: Diagnosis not present

## 2018-05-06 DIAGNOSIS — J310 Chronic rhinitis: Secondary | ICD-10-CM | POA: Diagnosis not present

## 2018-05-06 DIAGNOSIS — J329 Chronic sinusitis, unspecified: Secondary | ICD-10-CM | POA: Diagnosis not present

## 2018-05-24 DIAGNOSIS — M722 Plantar fascial fibromatosis: Secondary | ICD-10-CM | POA: Diagnosis not present

## 2018-05-24 DIAGNOSIS — M79671 Pain in right foot: Secondary | ICD-10-CM | POA: Diagnosis not present

## 2018-05-26 DIAGNOSIS — J4541 Moderate persistent asthma with (acute) exacerbation: Secondary | ICD-10-CM | POA: Diagnosis not present

## 2018-06-22 ENCOUNTER — Other Ambulatory Visit (INDEPENDENT_AMBULATORY_CARE_PROVIDER_SITE_OTHER): Payer: PPO

## 2018-06-22 DIAGNOSIS — R945 Abnormal results of liver function studies: Secondary | ICD-10-CM

## 2018-06-22 DIAGNOSIS — I10 Essential (primary) hypertension: Secondary | ICD-10-CM | POA: Diagnosis not present

## 2018-06-22 DIAGNOSIS — R739 Hyperglycemia, unspecified: Secondary | ICD-10-CM | POA: Diagnosis not present

## 2018-06-22 DIAGNOSIS — D72819 Decreased white blood cell count, unspecified: Secondary | ICD-10-CM

## 2018-06-22 DIAGNOSIS — R7989 Other specified abnormal findings of blood chemistry: Secondary | ICD-10-CM

## 2018-06-22 LAB — CBC WITH DIFFERENTIAL/PLATELET
BASOS PCT: 1.3 % (ref 0.0–3.0)
Basophils Absolute: 0.1 10*3/uL (ref 0.0–0.1)
EOS ABS: 0.3 10*3/uL (ref 0.0–0.7)
Eosinophils Relative: 7.3 % — ABNORMAL HIGH (ref 0.0–5.0)
HCT: 38.5 % (ref 36.0–46.0)
Hemoglobin: 12.9 g/dL (ref 12.0–15.0)
LYMPHS ABS: 1.8 10*3/uL (ref 0.7–4.0)
Lymphocytes Relative: 42.9 % (ref 12.0–46.0)
MCHC: 33.6 g/dL (ref 30.0–36.0)
MCV: 88.3 fl (ref 78.0–100.0)
Monocytes Absolute: 0.4 10*3/uL (ref 0.1–1.0)
Monocytes Relative: 9.3 % (ref 3.0–12.0)
NEUTROS ABS: 1.6 10*3/uL (ref 1.4–7.7)
NEUTROS PCT: 39.2 % — AB (ref 43.0–77.0)
PLATELETS: 170 10*3/uL (ref 150.0–400.0)
RBC: 4.35 Mil/uL (ref 3.87–5.11)
RDW: 13.8 % (ref 11.5–15.5)
WBC: 4.1 10*3/uL (ref 4.0–10.5)

## 2018-06-22 LAB — BASIC METABOLIC PANEL
BUN: 13 mg/dL (ref 6–23)
CALCIUM: 10 mg/dL (ref 8.4–10.5)
CO2: 26 meq/L (ref 19–32)
CREATININE: 0.69 mg/dL (ref 0.40–1.20)
Chloride: 106 mEq/L (ref 96–112)
GFR: 89.74 mL/min (ref >60.00)
GLUCOSE: 104 mg/dL — AB (ref 70–99)
Potassium: 4 mEq/L (ref 3.5–5.1)
Sodium: 138 mEq/L (ref 135–145)

## 2018-06-22 LAB — LIPID PANEL
Cholesterol: 148 mg/dL (ref 0–200)
HDL: 48.1 mg/dL (ref >39.00)
LDL Cholesterol: 82 mg/dL (ref 0–99)
NonHDL: 99.75
TRIGLYCERIDES: 87 mg/dL (ref 0.0–149.0)
Total CHOL/HDL Ratio: 3
VLDL: 17.4 mg/dL (ref 0.0–40.0)

## 2018-06-22 LAB — HEPATIC FUNCTION PANEL
ALBUMIN: 4.3 g/dL (ref 3.5–5.2)
ALK PHOS: 56 U/L (ref 39–117)
ALT: 34 U/L (ref 0–35)
AST: 42 U/L — AB (ref 0–37)
BILIRUBIN DIRECT: 0.1 mg/dL (ref 0.0–0.3)
TOTAL PROTEIN: 7.4 g/dL (ref 6.0–8.3)
Total Bilirubin: 0.5 mg/dL (ref 0.2–1.2)

## 2018-06-22 LAB — HEMOGLOBIN A1C: Hgb A1c MFr Bld: 5.9 % (ref 4.6–6.5)

## 2018-06-23 ENCOUNTER — Encounter: Payer: Self-pay | Admitting: Internal Medicine

## 2018-06-23 ENCOUNTER — Ambulatory Visit (INDEPENDENT_AMBULATORY_CARE_PROVIDER_SITE_OTHER): Payer: PPO | Admitting: Internal Medicine

## 2018-06-23 VITALS — BP 120/72 | HR 88 | Temp 97.6°F | Resp 16 | Wt 174.8 lb

## 2018-06-23 DIAGNOSIS — Z1231 Encounter for screening mammogram for malignant neoplasm of breast: Secondary | ICD-10-CM

## 2018-06-23 DIAGNOSIS — K219 Gastro-esophageal reflux disease without esophagitis: Secondary | ICD-10-CM | POA: Diagnosis not present

## 2018-06-23 DIAGNOSIS — J309 Allergic rhinitis, unspecified: Secondary | ICD-10-CM

## 2018-06-23 DIAGNOSIS — R739 Hyperglycemia, unspecified: Secondary | ICD-10-CM

## 2018-06-23 DIAGNOSIS — Z1239 Encounter for other screening for malignant neoplasm of breast: Secondary | ICD-10-CM

## 2018-06-23 DIAGNOSIS — R945 Abnormal results of liver function studies: Secondary | ICD-10-CM

## 2018-06-23 DIAGNOSIS — I10 Essential (primary) hypertension: Secondary | ICD-10-CM | POA: Diagnosis not present

## 2018-06-23 DIAGNOSIS — J4520 Mild intermittent asthma, uncomplicated: Secondary | ICD-10-CM

## 2018-06-23 DIAGNOSIS — R7989 Other specified abnormal findings of blood chemistry: Secondary | ICD-10-CM

## 2018-06-23 DIAGNOSIS — Z760 Encounter for issue of repeat prescription: Secondary | ICD-10-CM | POA: Diagnosis not present

## 2018-06-23 DIAGNOSIS — M25649 Stiffness of unspecified hand, not elsewhere classified: Secondary | ICD-10-CM

## 2018-06-23 DIAGNOSIS — M722 Plantar fascial fibromatosis: Secondary | ICD-10-CM | POA: Diagnosis not present

## 2018-06-23 MED ORDER — MELOXICAM 15 MG PO TBDP: 15.0000 mg | ORAL_TABLET | Freq: Every day | ORAL | 1 refills | Status: DC

## 2018-06-23 MED ORDER — OMEPRAZOLE 10 MG PO CPDR: 10.0000 mg | DELAYED_RELEASE_CAPSULE | Freq: Every day | ORAL | 5 refills | Status: DC

## 2018-06-23 MED ORDER — LOSARTAN POTASSIUM 25 MG PO TABS: 25.0000 mg | ORAL_TABLET | Freq: Every day | ORAL | 1 refills | Status: DC

## 2018-06-23 NOTE — Progress Notes (Signed)
Patient ID: Chloe Moyer, female   DOB: July 02, 1951, 67 y.o.   MRN: [id number]   Subjective:    Patient ID: Chloe Moyer, female    DOB: 09/29/1951, 67 y.o.   MRN: [id number]  HPI  Patient here for a scheduled follow up.  She reports she is doing relatively well.  She reports has noticed stiffness in her hands.  Notices when wakes up.  Once up and moving - does ok.  Has seen podiatry for plantar fasciitis.  On meloxicam.  Only taking 1/2 tablet a few times per week.  This controls pain.  Doing well.  Tolerating meloxicam.  No chest pain.  Breathing stable.  No acid reflux.  No abdominal pain.  Bowels moving.     Past Medical History:  Diagnosis Date  . Allergic rhinitis   . Arthritis   . Asthma   . Diverticulosis, sigmoid   . GERD (gastroesophageal reflux disease)   . History of hiatal hernia   . Hypertension 06-12-2005  . Ulcer    Past Surgical History:  Procedure Laterality Date  . BREAST BIOPSY Right 09/01/2017   Affirm Bx of 2 areas- path pending  . BREAST CYST ASPIRATION Left 1990  . CARDIAC CATHETERIZATION  July 2012  . CHOLECYSTECTOMY  1991  . LIPOMA EXCISION  4/99   Left flank area  . NASAL SINUS SURGERY  July 2012  . TUBAL LIGATION  1984   Family History  Problem Relation Age of Onset  . Emphysema Father        smoked  . Asthma Father   . Lung cancer Father        smoked  . Hypertension Father   . Hypertension Mother   . Breast cancer Maternal Grandmother 60  . Heart disease Paternal Grandfather        myocardial infarction  . Stroke Maternal Grandfather    Social History   Socioeconomic History  . Marital status: Married    Spouse name: Not on file  . Number of children: 2  . Years of education: Not on file  . Highest education level: Not on file  Occupational History  . Occupation: Works at [employer]  Social Needs  . Financial resource strain: Not on file  . Food insecurity:    Worry: Not on file    Inability: Not on file  .  Transportation needs:    Medical: Not on file    Non-medical: Not on file  Tobacco Use  . Smoking status: Never Smoker  . Smokeless tobacco: Never Used  Substance and Sexual Activity  . Alcohol use: Yes    Alcohol/week: 0.0 standard drinks    Comment: occ wine with dinner  . Drug use: No  . Sexual activity: Yes  Lifestyle  . Physical activity:    Days per week: Not on file    Minutes per session: Not on file  . Stress: Not on file  Relationships  . Social connections:    Talks on phone: Not on file    Gets together: Not on file    Attends religious service: Not on file    Active member of club or organization: Not on file    Attends meetings of clubs or organizations: Not on file    Relationship status: Not on file  Other Topics Concern  . Not on file  Social History Narrative  . Not on file    Outpatient Encounter Medications as of 06/23/2018  Medication Sig  .  albuterol (PROVENTIL HFA;VENTOLIN HFA) 108 (90 BASE) MCG/ACT inhaler Inhale 2 puffs using inhaler every 4-6 hours as needed for SOB/wheeze.  . budesonide (PULMICORT) 0.5 MG/2ML nebulizer solution Take 0.5 mg by nebulization 2 (two) times daily.  . budesonide-formoterol (SYMBICORT) 160-4.5 MCG/ACT inhaler Inhale 2 puffs into the lungs daily. as directed.  Marland Kitchen EPINEPHrine (EPIPEN 2-PAK) 0.3 mg/0.3 mL IJ SOAJ injection Inject 0.3 mLs (0.3 mg total) into the muscle once.  . hydrocortisone (ANUSOL-HC) 25 MG suppository Place 1 suppository (25 mg total) rectally 2 (two) times daily as needed for hemorrhoids or itching.  . hydrOXYzine (ATARAX/VISTARIL) 25 MG tablet take 1 tablet by mouth once daily if needed  . Hypertonic Nasal Wash (SINUS RINSE NA) As directed three times daily   . losartan (COZAAR) 25 MG tablet Take 1 tablet (25 mg total) by mouth daily. Hold until pt request refill.  . Meloxicam 15 MG TBDP Take 15 mg by mouth daily.  . montelukast (SINGULAIR) 10 MG tablet Take 10 mg by mouth at bedtime.  Marland Kitchen omalizumab  (XOLAIR) 150 MG injection 150 mg every 30 (thirty) days.   Marland Kitchen omeprazole (PRILOSEC) 10 MG capsule Take 1 capsule (10 mg total) by mouth daily.  . [DISCONTINUED] losartan (COZAAR) 25 MG tablet Take 1 tablet (25 mg total) by mouth daily. Hold until pt request refill.  . [DISCONTINUED] Meloxicam 15 MG TBDP   . [DISCONTINUED] omeprazole (PRILOSEC) 10 MG capsule Take by mouth.   No facility-administered encounter medications on file as of 06/23/2018.     Review of Systems  Constitutional: Negative for appetite change and unexpected weight change.  HENT: Negative for congestion and sinus pressure.   Respiratory: Negative for cough, chest tightness and shortness of breath.   Cardiovascular: Negative for chest pain, palpitations and leg swelling.  Gastrointestinal: Negative for abdominal pain, nausea and vomiting.  Genitourinary: Negative for difficulty urinating and dysuria.  Musculoskeletal: Negative for myalgias.       Stiffness in hands as outlined.  Better once moving.    Skin: Negative for color change and rash.  Neurological: Negative for dizziness, light-headedness and headaches.  Psychiatric/Behavioral: Negative for agitation and dysphoric mood.       Objective:     Blood pressure rechecked by me:  118/72  Physical Exam  Constitutional: She appears well-developed and well-nourished. No distress.  HENT:  Nose: Nose normal.  Mouth/Throat: Oropharynx is clear and moist.  Neck: Neck supple. No thyromegaly present.  Cardiovascular: Normal rate and regular rhythm.  Pulmonary/Chest: Breath sounds normal. No respiratory distress. She has no wheezes.  Abdominal: Soft. Bowel sounds are normal. There is no tenderness.  Musculoskeletal: She exhibits no edema or tenderness.  Lymphadenopathy:    She has no cervical adenopathy.  Skin: No rash noted. No erythema.  Psychiatric: She has a normal mood and affect. Her behavior is normal.    BP 120/72 (BP Location: Left Arm, Patient Position:  Sitting, Cuff Size: Normal)   Pulse 88   Temp 97.6 F (36.4 C) (Oral)   Resp 16   Wt 174 lb 12.8 oz (79.3 kg)   LMP 11/03/1996   SpO2 97%   BMI 30.00 kg/m  Wt Readings from Last 3 Encounters:  06/23/18 174 lb 12.8 oz (79.3 kg)  02/17/18 178 lb (80.7 kg)  01/19/18 176 lb (79.8 kg)     Lab Results  Component Value Date   WBC 4.1 06/22/2018   HGB 12.9 06/22/2018   HCT 38.5 06/22/2018   PLT 170.0 06/22/2018  GLUCOSE 104 (H) 06/22/2018   CHOL 148 06/22/2018   TRIG 87.0 06/22/2018   HDL 48.10 06/22/2018   LDLCALC 82 06/22/2018   ALT 34 06/22/2018   AST 42 (H) 06/22/2018   NA 138 06/22/2018   K 4.0 06/22/2018   CL 106 06/22/2018   CREATININE 0.69 06/22/2018   BUN 13 06/22/2018   CO2 26 06/22/2018   TSH 2.87 02/15/2018   INR 0.94 01/07/2017   HGBA1C 5.9 06/22/2018    US Venous Img Lower Unilateral Left  Result Date: 02/17/2018 CLINICAL DATA:  67 year old female with a history of left leg pain and swelling EXAM: LEFT LOWER EXTREMITY VENOUS DOPPLER ULTRASOUND TECHNIQUE: Gray-scale sonography with graded compression, as well as color Doppler and duplex ultrasound were performed to evaluate the lower extremity deep venous systems from the level of the common femoral vein and including the common femoral, femoral, profunda femoral, popliteal and calf veins including the posterior tibial, peroneal and gastrocnemius veins when visible. The superficial great saphenous vein was also interrogated. Spectral Doppler was utilized to evaluate flow at rest and with distal augmentation maneuvers in the common femoral, femoral and popliteal veins. COMPARISON:  None. FINDINGS: Contralateral Common Femoral Vein: Respiratory phasicity is normal and symmetric with the symptomatic side. No evidence of thrombus. Normal compressibility. Common Femoral Vein: No evidence of thrombus. Normal compressibility, respiratory phasicity and response to augmentation. Saphenofemoral Junction: No evidence of thrombus.  Normal compressibility and flow on color Doppler imaging. Profunda Femoral Vein: No evidence of thrombus. Normal compressibility and flow on color Doppler imaging. Femoral Vein: No evidence of thrombus. Normal compressibility, respiratory phasicity and response to augmentation. Popliteal Vein: No evidence of thrombus. Normal compressibility, respiratory phasicity and response to augmentation. Calf Veins: No evidence of thrombus. Normal compressibility and flow on color Doppler imaging. Superficial Great Saphenous Vein: No evidence of thrombus. Normal compressibility and flow on color Doppler imaging. Other Findings: Cystic structure in the popliteal region measures 2.9 cm x 0.7 cm x 3.2 cm compatible with Baker's cyst IMPRESSION: Sonographic survey of the left lower extremity negative for DVT. Left-sided Baker's cyst. Electronically Signed   By: Corrie Mckusick D.O.   On: 02/17/2018 11:08       Assessment & Plan:   Problem List Items Addressed This Visit    Abnormal liver function tests    S/p liver biopsy.  Followed by GI.  Recent liver test stable.  Follow.        Allergic rhinitis    Followed by ENT.  Doing well.        Asthma    Breathing stable.       GERD (gastroesophageal reflux disease)    Controlled on current regimen.  Follow.        Relevant Medications   omeprazole (PRILOSEC) 10 MG capsule   Hyperglycemia    Low carb diet and exercise.  Follow met b and a1c.        Relevant Orders   Hemoglobin A1c   Hypertension    Blood pressure under good control.  Continue same medication regimen.  Follow pressures.  Follow metabolic panel.        Relevant Medications   losartan (COZAAR) 25 MG tablet   Other Relevant Orders   CBC with Differential/Platelet   Hepatic function panel   Lipid panel   Basic metabolic panel   Joint stiffness of hand    Hand stiffness.  Better once moving.  Desires no further intervention.  Follow.  Plantar fasciitis    On meloxicam.  Only  taking 1/2 tablet a few days per week.  Discussed possible side effects and risk of the medication.  Foot is better.  Follow.         Other Visit Diagnoses    Breast cancer screening    -  Primary   Relevant Orders   MM 3D SCREEN BREAST BILATERAL   Medication refill       Relevant Medications   losartan (COZAAR) 25 MG tablet       Einar Pheasant, MD

## 2018-06-26 ENCOUNTER — Encounter: Payer: Self-pay | Admitting: Internal Medicine

## 2018-06-26 DIAGNOSIS — M25649 Stiffness of unspecified hand, not elsewhere classified: Secondary | ICD-10-CM | POA: Insufficient documentation

## 2018-06-26 DIAGNOSIS — M722 Plantar fascial fibromatosis: Secondary | ICD-10-CM | POA: Insufficient documentation

## 2018-06-26 NOTE — Assessment & Plan Note (Signed)
Hand stiffness.  Better once moving.  Desires no further intervention.  Follow.

## 2018-06-26 NOTE — Assessment & Plan Note (Signed)
Controlled on current regimen.  Follow.  

## 2018-06-26 NOTE — Assessment & Plan Note (Signed)
Breathing stable.

## 2018-06-26 NOTE — Assessment & Plan Note (Signed)
Low carb diet and exercise.  Follow met b and a1c.   

## 2018-06-26 NOTE — Assessment & Plan Note (Signed)
Followed by ENT.  Doing well.   

## 2018-06-26 NOTE — Assessment & Plan Note (Signed)
S/p liver biopsy.  Followed by GI.  Recent liver test stable.  Follow.

## 2018-06-26 NOTE — Assessment & Plan Note (Signed)
Blood pressure under good control.  Continue same medication regimen.  Follow pressures.  Follow metabolic panel.   

## 2018-06-26 NOTE — Assessment & Plan Note (Signed)
On meloxicam.  Only taking 1/2 tablet a few days per week.  Discussed possible side effects and risk of the medication.  Foot is better.  Follow.

## 2018-06-30 DIAGNOSIS — J4541 Moderate persistent asthma with (acute) exacerbation: Secondary | ICD-10-CM | POA: Diagnosis not present

## 2018-07-20 ENCOUNTER — Ambulatory Visit (INDEPENDENT_AMBULATORY_CARE_PROVIDER_SITE_OTHER): Payer: PPO

## 2018-07-20 VITALS — BP 118/64 | HR 78 | Temp 98.2°F | Resp 15 | Ht 66.0 in | Wt 175.4 lb

## 2018-07-20 DIAGNOSIS — Z0000 Encounter for general adult medical examination without abnormal findings: Secondary | ICD-10-CM

## 2018-07-20 DIAGNOSIS — E2839 Other primary ovarian failure: Secondary | ICD-10-CM | POA: Diagnosis not present

## 2018-07-20 NOTE — Patient Instructions (Addendum)
  Chloe Moyer , Thank you for taking time to come for your Medicare Wellness Visit. I appreciate your ongoing commitment to your health goals. Please review the following plan we discussed and let me know if I can assist you in the future.   These are the goals we discussed: Goals      Patient Stated   . Low carb diet (pt-stated)     Increase lean proteins Ensure Max protein sample provided       This is a list of the screening recommended for you and due dates:  Health Maintenance  Topic Date Due  . Flu Shot  06/17/2018  . Colon Cancer Screening  05/13/2019  . Mammogram  08/21/2019  . Tetanus Vaccine  04/29/2023  . DEXA scan (bone density measurement)  Completed  .  Hepatitis C: One time screening is recommended by Center for Disease Control  (CDC) for  adults born from [birth year] through 1967.   Completed  . Pneumonia vaccines  Completed

## 2018-07-20 NOTE — Progress Notes (Signed)
Subjective:   Chloe Moyer is a 67 y.o. female who presents for Medicare Annual (Subsequent) preventive examination.  Review of Systems:  No ROS.  Medicare Wellness Visit. Additional risk factors are reflected in the social history. Cardiac Risk Factors include: advanced age (>74men, >11 women);hypertension     Objective:     Vitals: BP 118/64 (BP Location: Left Arm, Patient Position: Sitting, Cuff Size: Normal)   Pulse 78   Temp 98.2 F (36.8 C) (Oral)   Resp 15   Ht 5\' 6"  (1.676 m)   Wt 175 lb 6.4 oz (79.6 kg)   LMP 11/03/1996   SpO2 97%   BMI 28.31 kg/m   Body mass index is 28.31 kg/m.  Advanced Directives 07/20/2018 07/17/2017 01/07/2017 07/15/2016 02/18/2016 07/28/2015 03/21/2015  Does Patient Have a Medical Advance Directive? Yes No No No No No No  Type of Paramedic of [place];Living will - - - - - -  Does patient want to make changes to medical advance directive? No - Patient declined - - - - - -  Copy of [place] in Chart? No - copy requested - - - - - -  Would patient like information on creating a medical advance directive? - No - Patient declined Yes (MAU/Ambulatory/Procedural Areas - Information given) No - patient declined information - No - patient declined information Yes Higher education careers adviser given    Tobacco Social History   Tobacco Use  Smoking Status Never Smoker  Smokeless Tobacco Never Used     Counseling given: Not Answered   Clinical Intake:  Pre-visit preparation completed: Yes  Pain : No/denies pain     Nutritional Status: BMI 25 -29 Overweight Diabetes: No  How often do you need to have someone help you when you read instructions, pamphlets, or other written materials from your doctor or pharmacy?: 1 - Never  Interpreter Needed?: No     Past Medical History:  Diagnosis Date  . Allergic rhinitis   . Arthritis   . Asthma   . Diverticulosis, sigmoid   . GERD (gastroesophageal reflux  disease)   . History of hiatal hernia   . Hypertension 06-12-2005  . Ulcer    Past Surgical History:  Procedure Laterality Date  . BREAST BIOPSY Right 09/01/2017   Affirm Bx of 2 areas- path pending  . BREAST CYST ASPIRATION Left 1990  . CARDIAC CATHETERIZATION  July 2012  . CHOLECYSTECTOMY  1991  . LIPOMA EXCISION  4/99   Left flank area  . NASAL SINUS SURGERY  July 2012  . TUBAL LIGATION  1984   Family History  Problem Relation Age of Onset  . Emphysema Father        smoked  . Asthma Father   . Lung cancer Father        smoked  . Hypertension Father   . Hypertension Mother   . Breast cancer Maternal Grandmother 60  . Heart disease Paternal Grandfather        myocardial infarction  . Stroke Maternal Grandfather    Social History   Socioeconomic History  . Marital status: Married    Spouse name: Not on file  . Number of children: 2  . Years of education: Not on file  . Highest education level: Not on file  Occupational History  . Occupation: Works at [employer]  Social Needs  . Financial resource strain: Not on file  . Food insecurity:    Worry: Not on  file    Inability: Not on file  . Transportation needs:    Medical: Not on file    Non-medical: Not on file  Tobacco Use  . Smoking status: Never Smoker  . Smokeless tobacco: Never Used  Substance and Sexual Activity  . Alcohol use: Yes    Alcohol/week: 0.0 standard drinks    Comment: occ wine with dinner  . Drug use: No  . Sexual activity: Yes  Lifestyle  . Physical activity:    Days per week: Not on file    Minutes per session: Not on file  . Stress: Not on file  Relationships  . Social connections:    Talks on phone: Not on file    Gets together: Not on file    Attends religious service: Not on file    Active member of club or organization: Not on file    Attends meetings of clubs or organizations: Not on file    Relationship status: Not on file  Other Topics Concern  . Not on file  Social  History Narrative  . Not on file    Outpatient Encounter Medications as of 07/20/2018  Medication Sig  . albuterol (PROVENTIL HFA;VENTOLIN HFA) 108 (90 BASE) MCG/ACT inhaler Inhale 2 puffs using inhaler every 4-6 hours as needed for SOB/wheeze.  . budesonide (PULMICORT) 0.5 MG/2ML nebulizer solution Take 0.5 mg by nebulization 2 (two) times daily.  . budesonide-formoterol (SYMBICORT) 160-4.5 MCG/ACT inhaler Inhale 2 puffs into the lungs daily. as directed.  Marland Kitchen EPINEPHrine (EPIPEN 2-PAK) 0.3 mg/0.3 mL IJ SOAJ injection Inject 0.3 mLs (0.3 mg total) into the muscle once.  . hydrocortisone (ANUSOL-HC) 25 MG suppository Place 1 suppository (25 mg total) rectally 2 (two) times daily as needed for hemorrhoids or itching.  . hydrOXYzine (ATARAX/VISTARIL) 25 MG tablet take 1 tablet by mouth once daily if needed  . Hypertonic Nasal Wash (SINUS RINSE NA) As directed three times daily   . losartan (COZAAR) 25 MG tablet Take 1 tablet (25 mg total) by mouth daily. Hold until pt request refill.  . Meloxicam 15 MG TBDP Take 15 mg by mouth daily.  Marland Kitchen omalizumab (XOLAIR) 150 MG injection 150 mg every 30 (thirty) days.   Marland Kitchen omeprazole (PRILOSEC) 10 MG capsule Take 1 capsule (10 mg total) by mouth daily.  . montelukast (SINGULAIR) 10 MG tablet Take 10 mg by mouth at bedtime.   No facility-administered encounter medications on file as of 07/20/2018.     Activities of Daily Living In your present state of health, do you have any difficulty performing the following activities: 07/20/2018  Hearing? N  Vision? N  Difficulty concentrating or making decisions? N  Walking or climbing stairs? N  Dressing or bathing? N  Doing errands, shopping? N  Preparing Food and eating ? N  Using the Toilet? N  In the past six months, have you accidently leaked urine? N  Do you have problems with loss of bowel control? N  Managing your Medications? N  Managing your Finances? N  Housekeeping or managing your Housekeeping? N  Some  recent data might be hidden    Patient Care Team: Einar Pheasant, MD as PCP - General (Internal Medicine) Beverly Gust, MD as Referring Physician (Unknown Physician Specialty) Powers, Eloy End, MD as Referring Physician (Pulmonary Disease) Senior, Michel Santee, MD as Referring Physician (Otolaryngology)    Assessment:   This is a routine wellness examination for Chloe Moyer. The goal of the wellness visit is to assist the patient how  to close the gaps in care and create a preventative care plan for the patient.   The roster of all physicians providing medical care to patient is listed in the Snapshot section of the chart.  Osteoporosis risk reviewed.    Safety issues reviewed; Smoke and carbon monoxide detectors in the home. No firearms in the home. Wears seatbelts when driving or riding with others. No violence in the home.  They do not have excessive sun exposure.  Discussed the need for sun protection: hats, long sleeves and the use of sunscreen if there is significant sun exposure.  Patient is alert, normal appearance, oriented to person/place/and time.  Correctly identified the president of the [place] and recalls of 3/3 words. Performs simple calculations and can read correct time from watch face.  Displays appropriate judgement.  No new identified risk were noted.  No failures at ADL's or IADL's.    BMI- discussed the importance of a healthy diet, water intake and the benefits of aerobic exercise. Educational material provided.   24 hour diet recall: Regular diet  Dental- every 3 months.  Dr. Merrilyn Puma.   Sleep patterns- Sleeps without issues.   Influenza vaccine discussed. Plans to receive later in the season.   Dexa scan ordered per patient request. Educational material provided.   Patient Concerns: Increased joint stiffness and pain mostly in the hands.  Questions whether or not it could be caused by a particular medication. Declined follow up appointment with pcp.  She has a follow up with pulmonary tomorrow and plans to discuss.  Encouraged to follow up with pcp as needed.   Exercise Activities and Dietary recommendations Current Exercise Habits: The patient does not participate in regular exercise at present  Goals      Patient Stated   . Low carb diet (pt-stated)     Increase lean proteins Ensure Max protein sample provided       Fall Risk Fall Risk  07/20/2018 07/17/2017 07/15/2016 03/21/2015 10/10/2012  Falls in the past year? No Yes No No No  Number falls in past yr: - 1 - - -  Injury with Fall? - No - - -  Comment - Tripped over the cat - - -  Follow up - Falls prevention discussed - - -   Depression Screen PHQ 2/9 Scores 07/20/2018 07/17/2017 07/15/2016 03/21/2015  PHQ - 2 Score 0 0 0 0  PHQ- 9 Score - 0 - -     Cognitive Function MMSE - Mini Mental State Exam 07/20/2018 07/17/2017 07/15/2016 03/21/2015  Orientation to time 5 5 5 5   Orientation to Place 5 5 5 5   Registration 3 3 3 3   Attention/ Calculation 5 5 5 5   Recall 3 3 3 3   Language- name 2 objects 2 2 2 2   Language- repeat 1 1 1 1   Language- follow 3 step command 3 3 3 3   Language- read & follow direction 1 1 1 1   Write a sentence 1 1 1 1   Copy design 1 1 1 1   Total score 30 30 30 30         Immunization History  Administered Date(s) Administered  . Influenza Split 09/27/2012, 09/27/2012  . Influenza, High Dose Seasonal PF 07/17/2017  . Influenza,inj,Quad PF,6+ Mos 07/28/2014, 08/21/2015  . Influenza-Unspecified 11/28/2011, 08/21/2015, 07/08/2016  . Pneumococcal Conjugate-13 03/22/2015  . Pneumococcal Polysaccharide-23 07/15/2016  . Tdap 04/28/2013  . Zoster 02/23/2014   Screening Tests Health Maintenance  Topic Date Due  . INFLUENZA VACCINE  06/17/2018  . COLONOSCOPY  05/13/2019  . MAMMOGRAM  08/21/2019  . TETANUS/TDAP  04/29/2023  . DEXA SCAN  Completed  . Hepatitis C Screening  Completed  . PNA vac Low Risk Adult  Completed      Plan:    End of life  planning; Advance aging; Advanced directives discussed. Copy of current HCPOA/Living Will requested.    I have personally reviewed and noted the following in the patient's chart:   . Medical and social history . Use of alcohol, tobacco or illicit drugs  . Current medications and supplements . Functional ability and status . Nutritional status . Physical activity . Advanced directives . List of other physicians . Hospitalizations, surgeries, and ER visits in previous 12 months . Vitals . Screenings to include cognitive, depression, and falls . Referrals and appointments  In addition, I have reviewed and discussed with patient certain preventive protocols, quality metrics, and best practice recommendations. A written personalized care plan for preventive services as well as general preventive health recommendations were provided to patient.     Varney Biles, LPN  06/21/1659   Reviewed above information.  Agree with assessment and plan.  Agree with f/u regarding hand stiffness.    Dr Nicki Reaper

## 2018-08-18 DIAGNOSIS — J4541 Moderate persistent asthma with (acute) exacerbation: Secondary | ICD-10-CM | POA: Diagnosis not present

## 2018-08-25 ENCOUNTER — Other Ambulatory Visit: Payer: Self-pay | Admitting: Internal Medicine

## 2018-08-26 DIAGNOSIS — J4540 Moderate persistent asthma, uncomplicated: Secondary | ICD-10-CM | POA: Diagnosis not present

## 2018-08-26 DIAGNOSIS — Z23 Encounter for immunization: Secondary | ICD-10-CM | POA: Diagnosis not present

## 2018-08-27 NOTE — Telephone Encounter (Signed)
rx ok'd for meloxicam #30 with no refills.

## 2018-09-02 ENCOUNTER — Ambulatory Visit
Admission: RE | Admit: 2018-09-02 | Discharge: 2018-09-02 | Disposition: A | Discharge status: Home / Self Care | Payer: PPO | Source: Ambulatory Visit | Attending: Internal Medicine | Admitting: Internal Medicine

## 2018-09-02 DIAGNOSIS — Z780 Asymptomatic menopausal state: Secondary | ICD-10-CM | POA: Insufficient documentation

## 2018-09-02 DIAGNOSIS — E2839 Other primary ovarian failure: Secondary | ICD-10-CM | POA: Insufficient documentation

## 2018-09-02 DIAGNOSIS — M85852 Other specified disorders of bone density and structure, left thigh: Secondary | ICD-10-CM | POA: Insufficient documentation

## 2018-09-02 DIAGNOSIS — M85832 Other specified disorders of bone density and structure, left forearm: Secondary | ICD-10-CM | POA: Insufficient documentation

## 2018-09-02 DIAGNOSIS — Z7952 Long term (current) use of systemic steroids: Secondary | ICD-10-CM | POA: Insufficient documentation

## 2018-09-02 DIAGNOSIS — Z1239 Encounter for other screening for malignant neoplasm of breast: Secondary | ICD-10-CM

## 2018-09-02 DIAGNOSIS — Z7951 Long term (current) use of inhaled steroids: Secondary | ICD-10-CM | POA: Diagnosis not present

## 2018-09-02 DIAGNOSIS — Z13820 Encounter for screening for osteoporosis: Secondary | ICD-10-CM | POA: Insufficient documentation

## 2018-09-02 DIAGNOSIS — Z1231 Encounter for screening mammogram for malignant neoplasm of breast: Secondary | ICD-10-CM | POA: Insufficient documentation

## 2018-09-02 DIAGNOSIS — M8589 Other specified disorders of bone density and structure, multiple sites: Secondary | ICD-10-CM | POA: Diagnosis not present

## 2018-09-03 ENCOUNTER — Encounter: Payer: Self-pay | Admitting: Internal Medicine

## 2018-09-06 ENCOUNTER — Other Ambulatory Visit: Payer: Self-pay | Admitting: Internal Medicine

## 2018-09-06 DIAGNOSIS — R928 Other abnormal and inconclusive findings on diagnostic imaging of breast: Secondary | ICD-10-CM

## 2018-09-06 NOTE — Progress Notes (Signed)
Order placed for f/u left breast mammogram and ultrasound.   

## 2018-09-15 DIAGNOSIS — J4541 Moderate persistent asthma with (acute) exacerbation: Secondary | ICD-10-CM | POA: Diagnosis not present

## 2018-09-16 ENCOUNTER — Ambulatory Visit
Admission: RE | Admit: 2018-09-16 | Discharge: 2018-09-16 | Disposition: A | Discharge status: Home / Self Care | Payer: PPO | Source: Ambulatory Visit | Attending: Internal Medicine | Admitting: Internal Medicine

## 2018-09-16 DIAGNOSIS — R928 Other abnormal and inconclusive findings on diagnostic imaging of breast: Secondary | ICD-10-CM | POA: Diagnosis not present

## 2018-09-20 DIAGNOSIS — M1712 Unilateral primary osteoarthritis, left knee: Secondary | ICD-10-CM | POA: Diagnosis not present

## 2018-09-20 DIAGNOSIS — M25462 Effusion, left knee: Secondary | ICD-10-CM | POA: Diagnosis not present

## 2018-09-20 DIAGNOSIS — M7122 Synovial cyst of popliteal space [Baker], left knee: Secondary | ICD-10-CM | POA: Diagnosis not present

## 2018-09-20 DIAGNOSIS — M25562 Pain in left knee: Secondary | ICD-10-CM | POA: Diagnosis not present

## 2018-09-29 ENCOUNTER — Other Ambulatory Visit: Payer: Self-pay | Admitting: Internal Medicine

## 2018-10-11 DIAGNOSIS — M1712 Unilateral primary osteoarthritis, left knee: Secondary | ICD-10-CM | POA: Diagnosis not present

## 2018-10-11 DIAGNOSIS — M25562 Pain in left knee: Secondary | ICD-10-CM | POA: Diagnosis not present

## 2018-10-11 DIAGNOSIS — M25462 Effusion, left knee: Secondary | ICD-10-CM | POA: Diagnosis not present

## 2018-10-11 DIAGNOSIS — M7122 Synovial cyst of popliteal space [Baker], left knee: Secondary | ICD-10-CM | POA: Diagnosis not present

## 2018-10-12 DIAGNOSIS — J4541 Moderate persistent asthma with (acute) exacerbation: Secondary | ICD-10-CM | POA: Diagnosis not present

## 2018-10-18 ENCOUNTER — Other Ambulatory Visit: Payer: Self-pay | Admitting: Sports Medicine

## 2018-10-18 DIAGNOSIS — G8929 Other chronic pain: Secondary | ICD-10-CM

## 2018-10-18 DIAGNOSIS — M25562 Pain in left knee: Principal | ICD-10-CM

## 2018-10-27 ENCOUNTER — Other Ambulatory Visit (INDEPENDENT_AMBULATORY_CARE_PROVIDER_SITE_OTHER): Payer: PPO

## 2018-10-27 DIAGNOSIS — I10 Essential (primary) hypertension: Secondary | ICD-10-CM | POA: Diagnosis not present

## 2018-10-27 DIAGNOSIS — R739 Hyperglycemia, unspecified: Secondary | ICD-10-CM

## 2018-10-27 LAB — BASIC METABOLIC PANEL
BUN: 13 mg/dL (ref 6–23)
CALCIUM: 9.9 mg/dL (ref 8.4–10.5)
CHLORIDE: 104 meq/L (ref 96–112)
CO2: 27 meq/L (ref 19–32)
Creatinine, Ser: 0.61 mg/dL (ref 0.40–1.20)
GFR: 103.35 mL/min (ref >60.00)
Glucose, Bld: 132 mg/dL — ABNORMAL HIGH (ref 70–99)
Potassium: 3.8 mEq/L (ref 3.5–5.1)
SODIUM: 138 meq/L (ref 135–145)

## 2018-10-27 LAB — CBC WITH DIFFERENTIAL/PLATELET
BASOS ABS: 0 10*3/uL (ref 0.0–0.1)
Basophils Relative: 0.1 % (ref 0.0–3.0)
Eosinophils Absolute: 0 10*3/uL (ref 0.0–0.7)
Eosinophils Relative: 0 % (ref 0.0–5.0)
HEMATOCRIT: 40.5 % (ref 36.0–46.0)
HEMOGLOBIN: 13.5 g/dL (ref 12.0–15.0)
LYMPHS ABS: 1.1 10*3/uL (ref 0.7–4.0)
Lymphocytes Relative: 16 % (ref 12.0–46.0)
MCHC: 33.5 g/dL (ref 30.0–36.0)
MCV: 87.8 fl (ref 78.0–100.0)
MONO ABS: 0.2 10*3/uL (ref 0.1–1.0)
Monocytes Relative: 3.2 % (ref 3.0–12.0)
NEUTROS ABS: 5.4 10*3/uL (ref 1.4–7.7)
Neutrophils Relative %: 80.7 % — ABNORMAL HIGH (ref 43.0–77.0)
PLATELETS: 240 10*3/uL (ref 150.0–400.0)
RBC: 4.61 Mil/uL (ref 3.87–5.11)
RDW: 14.5 % (ref 11.5–15.5)
WBC: 6.7 10*3/uL (ref 4.0–10.5)

## 2018-10-27 LAB — HEPATIC FUNCTION PANEL
ALBUMIN: 4.4 g/dL (ref 3.5–5.2)
ALK PHOS: 59 U/L (ref 39–117)
ALT: 32 U/L (ref 0–35)
AST: 28 U/L (ref 0–37)
BILIRUBIN DIRECT: 0.1 mg/dL (ref 0.0–0.3)
TOTAL PROTEIN: 7.6 g/dL (ref 6.0–8.3)
Total Bilirubin: 0.5 mg/dL (ref 0.2–1.2)

## 2018-10-27 LAB — LIPID PANEL
CHOL/HDL RATIO: 3
Cholesterol: 168 mg/dL (ref 0–200)
HDL: 61.5 mg/dL (ref >39.00)
LDL CALC: 95 mg/dL (ref 0–99)
NonHDL: 106.54
Triglycerides: 59 mg/dL (ref 0.0–149.0)
VLDL: 11.8 mg/dL (ref 0.0–40.0)

## 2018-10-27 LAB — HEMOGLOBIN A1C: Hgb A1c MFr Bld: 5.9 % (ref 4.6–6.5)

## 2018-10-28 ENCOUNTER — Ambulatory Visit (INDEPENDENT_AMBULATORY_CARE_PROVIDER_SITE_OTHER): Payer: PPO | Admitting: Internal Medicine

## 2018-10-28 ENCOUNTER — Encounter: Payer: Self-pay | Admitting: Internal Medicine

## 2018-10-28 DIAGNOSIS — K219 Gastro-esophageal reflux disease without esophagitis: Secondary | ICD-10-CM

## 2018-10-28 DIAGNOSIS — J4520 Mild intermittent asthma, uncomplicated: Secondary | ICD-10-CM | POA: Diagnosis not present

## 2018-10-28 DIAGNOSIS — R739 Hyperglycemia, unspecified: Secondary | ICD-10-CM

## 2018-10-28 DIAGNOSIS — R945 Abnormal results of liver function studies: Secondary | ICD-10-CM | POA: Diagnosis not present

## 2018-10-28 DIAGNOSIS — R7989 Other specified abnormal findings of blood chemistry: Secondary | ICD-10-CM

## 2018-10-28 DIAGNOSIS — D72819 Decreased white blood cell count, unspecified: Secondary | ICD-10-CM

## 2018-10-28 DIAGNOSIS — I10 Essential (primary) hypertension: Secondary | ICD-10-CM

## 2018-10-28 NOTE — Progress Notes (Signed)
Patient ID: Chloe Moyer, female   DOB: 11-Mar-1951, 67 y.o.   MRN: [id number]   Subjective:    Patient ID: Chloe Moyer, female    DOB: 1951-09-15, 66 y.o.   MRN: [id number]  HPI  Patient here for a scheduled follow up.  She recently saw her pulmonologist 08/2018.  Recommended continuing xolair, singulair and symbicort.  Has f/u planned in 02/2019.  Breathing stable.  No increased cough or congestion.  She also has seen ortho for persistent left knee pain and swelling felt to be due to arthritis and Baker's cyst.  S/p injection and prednisone taper.  Still with pain.  As day progresses worsens.  Planning to f/u with ortho.  No chest pain.  No acid reflux.  No abdominal pain.  Bowels stable.     Past Medical History:  Diagnosis Date  . Allergic rhinitis   . Arthritis   . Asthma   . Diverticulosis, sigmoid   . GERD (gastroesophageal reflux disease)   . History of hiatal hernia   . Hypertension 06-12-2005  . Ulcer    Past Surgical History:  Procedure Laterality Date  . BREAST BIOPSY Right 09/01/2017   Affirm Bx of 2 areas- benign  . BREAST CYST ASPIRATION Left 1990  . CARDIAC CATHETERIZATION  July 2012  . CHOLECYSTECTOMY  1991  . LIPOMA EXCISION  4/99   Left flank area  . NASAL SINUS SURGERY  July 2012  . TUBAL LIGATION  1984   Family History  Problem Relation Age of Onset  . Emphysema Father        smoked  . Asthma Father   . Lung cancer Father        smoked  . Hypertension Father   . Hypertension Mother   . Breast cancer Maternal Grandmother 60  . Heart disease Paternal Grandfather        myocardial infarction  . Stroke Maternal Grandfather    Social History   Socioeconomic History  . Marital status: Married    Spouse name: Not on file  . Number of children: 2  . Years of education: Not on file  . Highest education level: Not on file  Occupational History  . Occupation: Works at [employer]  Social Needs  . Financial resource strain: Not on file  .  Food insecurity:    Worry: Not on file    Inability: Not on file  . Transportation needs:    Medical: Not on file    Non-medical: Not on file  Tobacco Use  . Smoking status: Never Smoker  . Smokeless tobacco: Never Used  Substance and Sexual Activity  . Alcohol use: Yes    Alcohol/week: 0.0 standard drinks    Comment: occ wine with dinner  . Drug use: No  . Sexual activity: Yes  Lifestyle  . Physical activity:    Days per week: Not on file    Minutes per session: Not on file  . Stress: Not on file  Relationships  . Social connections:    Talks on phone: Not on file    Gets together: Not on file    Attends religious service: Not on file    Active member of club or organization: Not on file    Attends meetings of clubs or organizations: Not on file    Relationship status: Not on file  Other Topics Concern  . Not on file  Social History Narrative  . Not on file    Outpatient Encounter Medications  as of 10/28/2018  Medication Sig  . albuterol (PROVENTIL HFA;VENTOLIN HFA) 108 (90 BASE) MCG/ACT inhaler Inhale 2 puffs using inhaler every 4-6 hours as needed for SOB/wheeze.  . budesonide (PULMICORT) 0.5 MG/2ML nebulizer solution Take 0.5 mg by nebulization 2 (two) times daily.  . budesonide-formoterol (SYMBICORT) 160-4.5 MCG/ACT inhaler Inhale 2 puffs into the lungs daily. as directed.  Marland Kitchen EPINEPHrine (EPIPEN 2-PAK) 0.3 mg/0.3 mL IJ SOAJ injection Inject 0.3 mLs (0.3 mg total) into the muscle once.  . hydrocortisone (ANUSOL-HC) 25 MG suppository Place 1 suppository (25 mg total) rectally 2 (two) times daily as needed for hemorrhoids or itching.  . hydrOXYzine (ATARAX/VISTARIL) 25 MG tablet take 1 tablet by mouth once daily if needed  . Hypertonic Nasal Wash (SINUS RINSE NA) As directed three times daily   . losartan (COZAAR) 25 MG tablet Take 1 tablet (25 mg total) by mouth daily. Hold until pt request refill.  . meloxicam (MOBIC) 15 MG tablet TAKE 1 TABLET BY MOUTH ONCE DAILY AS  NEEDED  . montelukast (SINGULAIR) 10 MG tablet Take 10 mg by mouth at bedtime.  Marland Kitchen omalizumab (XOLAIR) 150 MG injection 150 mg every 30 (thirty) days.   Marland Kitchen omeprazole (PRILOSEC) 10 MG capsule Take 1 capsule (10 mg total) by mouth daily.   No facility-administered encounter medications on file as of 10/28/2018.     Review of Systems  Constitutional: Negative for appetite change and unexpected weight change.  HENT: Negative for congestion and sinus pressure.   Respiratory: Negative for cough, chest tightness and shortness of breath.   Cardiovascular: Negative for chest pain and palpitations.  Gastrointestinal: Negative for abdominal pain, diarrhea, nausea and vomiting.  Genitourinary: Negative for difficulty urinating and dysuria.  Musculoskeletal: Negative for myalgias.       Knee pain and swelling as outlined.   Skin: Negative for color change and rash.  Neurological: Negative for dizziness, light-headedness and headaches.  Psychiatric/Behavioral: Negative for agitation and dysphoric mood.       Objective:     Blood pressure rechecked by me:  136/82  Physical Exam Constitutional:      General: She is not in acute distress.    Appearance: Normal appearance.  HENT:     Nose: Nose normal. No congestion.  Neck:     Musculoskeletal: Neck supple.     Thyroid: No thyromegaly.  Cardiovascular:     Rate and Rhythm: Normal rate and regular rhythm.  Pulmonary:     Effort: No respiratory distress.     Breath sounds: Normal breath sounds. No wheezing.  Abdominal:     General: Bowel sounds are normal.     Palpations: Abdomen is soft.     Tenderness: There is no abdominal tenderness.  Musculoskeletal:     Comments: Increased knee pain and swelling - left knee.  Increased pain with flexion of knee.  No increased erythema.    Lymphadenopathy:     Cervical: No cervical adenopathy.  Skin:    Findings: No erythema or rash.  Neurological:     Mental Status: She is alert.  Psychiatric:         Mood and Affect: Mood normal.        Behavior: Behavior normal.     BP 138/80 (BP Location: Left Arm, Patient Position: Sitting, Cuff Size: Normal)   Pulse 73   Temp (!) 97.3 F (36.3 C) (Oral)   Resp 16   Wt 176 lb 9.6 oz (80.1 kg)   LMP 11/03/1996   SpO2  98%   BMI 28.50 kg/m  Wt Readings from Last 3 Encounters:  10/28/18 176 lb 9.6 oz (80.1 kg)  07/20/18 175 lb 6.4 oz (79.6 kg)  06/23/18 174 lb 12.8 oz (79.3 kg)     Lab Results  Component Value Date   WBC 6.7 10/27/2018   HGB 13.5 10/27/2018   HCT 40.5 10/27/2018   PLT 240.0 10/27/2018   GLUCOSE 132 (H) 10/27/2018   CHOL 168 10/27/2018   TRIG 59.0 10/27/2018   HDL 61.50 10/27/2018   LDLCALC 95 10/27/2018   ALT 32 10/27/2018   AST 28 10/27/2018   NA 138 10/27/2018   K 3.8 10/27/2018   CL 104 10/27/2018   CREATININE 0.61 10/27/2018   BUN 13 10/27/2018   CO2 27 10/27/2018   TSH 2.87 02/15/2018   INR 0.94 01/07/2017   HGBA1C 5.9 10/27/2018    Mm Diag Breast Tomo Uni Left  Result Date: 09/16/2018 CLINICAL DATA:  Left breast upper outer quadrant distortion questioned on most recent screening mammography. EXAM: DIGITAL DIAGNOSTIC UNILATERAL LEFT MAMMOGRAM WITH CAD AND TOMO COMPARISON:  Previous exam(s). ACR Breast Density Category b: There are scattered areas of fibroglandular density. FINDINGS: Additional mammographic views of the left breast demonstrate no suspicious masses or areas of architectural distortion. The previously questioned distortion in the left breast upper outer quadrant effaces to glandular tissue. Mammographic images were processed with CAD. IMPRESSION: No mammographic evidence of malignancy in the left breast. RECOMMENDATION: Screening mammogram in one year.(Code:SM-B-01Y) I have discussed the findings and recommendations with the patient. Results were also provided in writing at the conclusion of the visit. If applicable, a reminder letter will be sent to the patient regarding the next  appointment. BI-RADS CATEGORY  1: Negative. Electronically Signed   By: Fidela Salisbury M.D.   On: 09/16/2018 09:52       Assessment & Plan:   Problem List Items Addressed This Visit    Abnormal liver function tests    S/p liver biopsy.  Has been followed by GI.  10/27/18 - liver panel wnl.        Relevant Orders   Lipid panel   Hepatic function panel   Asthma    Breathing stable.  Followed by pulmonary.  Continue current regimen.        GERD (gastroesophageal reflux disease)    Controlled on current regimen.  Follow.       Hyperglycemia    Low carb diet and exercise.  Follow met b and a1c.  Recent a1c 5.9.        Relevant Orders   Hemoglobin Z6X   Basic metabolic panel   Hypertension    Blood pressure under good control.  Continue same medication regimen.  Follow pressures.  Follow metabolic panel.        Relevant Orders   TSH   Leukopenia    White count wnl on recent check.  Follow.           Einar Pheasant, MD

## 2018-11-07 ENCOUNTER — Encounter: Payer: Self-pay | Admitting: Internal Medicine

## 2018-11-07 NOTE — Assessment & Plan Note (Signed)
Blood pressure under good control.  Continue same medication regimen.  Follow pressures.  Follow metabolic panel.   

## 2018-11-07 NOTE — Assessment & Plan Note (Signed)
White count wnl on recent check.  Follow.

## 2018-11-07 NOTE — Assessment & Plan Note (Signed)
Low carb diet and exercise.  Follow met b and a1c.  Recent a1c 5.9.

## 2018-11-07 NOTE — Assessment & Plan Note (Signed)
Controlled on current regimen.  Follow.  

## 2018-11-07 NOTE — Assessment & Plan Note (Signed)
S/p liver biopsy.  Has been followed by GI.  10/27/18 - liver panel wnl.

## 2018-11-07 NOTE — Assessment & Plan Note (Signed)
Breathing stable.  Followed by pulmonary.  Continue current regimen.

## 2018-11-12 ENCOUNTER — Other Ambulatory Visit: Payer: Self-pay | Admitting: Internal Medicine

## 2018-11-14 ENCOUNTER — Ambulatory Visit
Admission: RE | Admit: 2018-11-14 | Discharge: 2018-11-14 | Disposition: A | Discharge status: Home / Self Care | Payer: PPO | Source: Ambulatory Visit | Attending: Sports Medicine | Admitting: Sports Medicine

## 2018-11-14 DIAGNOSIS — M7122 Synovial cyst of popliteal space [Baker], left knee: Secondary | ICD-10-CM | POA: Insufficient documentation

## 2018-11-14 DIAGNOSIS — M8448XA Pathological fracture, other site, initial encounter for fracture: Secondary | ICD-10-CM | POA: Insufficient documentation

## 2018-11-14 DIAGNOSIS — M23322 Other meniscus derangements, posterior horn of medial meniscus, left knee: Secondary | ICD-10-CM | POA: Diagnosis not present

## 2018-11-14 DIAGNOSIS — G8929 Other chronic pain: Secondary | ICD-10-CM | POA: Insufficient documentation

## 2018-11-14 DIAGNOSIS — M25562 Pain in left knee: Secondary | ICD-10-CM | POA: Diagnosis not present

## 2018-11-14 DIAGNOSIS — M1712 Unilateral primary osteoarthritis, left knee: Secondary | ICD-10-CM | POA: Insufficient documentation

## 2018-11-14 DIAGNOSIS — X58XXXA Exposure to other specified factors, initial encounter: Secondary | ICD-10-CM | POA: Insufficient documentation

## 2018-11-14 DIAGNOSIS — S83207A Unspecified tear of unspecified meniscus, current injury, left knee, initial encounter: Secondary | ICD-10-CM | POA: Insufficient documentation

## 2018-11-16 ENCOUNTER — Ambulatory Visit: Payer: PPO

## 2018-11-16 DIAGNOSIS — J4541 Moderate persistent asthma with (acute) exacerbation: Secondary | ICD-10-CM | POA: Diagnosis not present

## 2018-12-02 DIAGNOSIS — Z6828 Body mass index (BMI) 28.0-28.9, adult: Secondary | ICD-10-CM | POA: Diagnosis not present

## 2018-12-02 DIAGNOSIS — J329 Chronic sinusitis, unspecified: Secondary | ICD-10-CM | POA: Diagnosis not present

## 2018-12-06 DIAGNOSIS — G8929 Other chronic pain: Secondary | ICD-10-CM | POA: Diagnosis not present

## 2018-12-06 DIAGNOSIS — M23222 Derangement of posterior horn of medial meniscus due to old tear or injury, left knee: Secondary | ICD-10-CM | POA: Diagnosis not present

## 2018-12-06 DIAGNOSIS — M1712 Unilateral primary osteoarthritis, left knee: Secondary | ICD-10-CM | POA: Diagnosis not present

## 2018-12-06 DIAGNOSIS — M84462D Pathological fracture, left tibia, subsequent encounter for fracture with routine healing: Secondary | ICD-10-CM | POA: Diagnosis not present

## 2018-12-06 DIAGNOSIS — M25562 Pain in left knee: Secondary | ICD-10-CM | POA: Diagnosis not present

## 2018-12-06 DIAGNOSIS — M7122 Synovial cyst of popliteal space [Baker], left knee: Secondary | ICD-10-CM | POA: Diagnosis not present

## 2018-12-15 DIAGNOSIS — M25562 Pain in left knee: Secondary | ICD-10-CM | POA: Diagnosis not present

## 2018-12-15 DIAGNOSIS — M1712 Unilateral primary osteoarthritis, left knee: Secondary | ICD-10-CM | POA: Diagnosis not present

## 2018-12-15 DIAGNOSIS — G8929 Other chronic pain: Secondary | ICD-10-CM | POA: Diagnosis not present

## 2018-12-15 DIAGNOSIS — M25662 Stiffness of left knee, not elsewhere classified: Secondary | ICD-10-CM | POA: Diagnosis not present

## 2018-12-15 DIAGNOSIS — M6281 Muscle weakness (generalized): Secondary | ICD-10-CM | POA: Diagnosis not present

## 2018-12-16 DIAGNOSIS — J4541 Moderate persistent asthma with (acute) exacerbation: Secondary | ICD-10-CM | POA: Diagnosis not present

## 2018-12-20 DIAGNOSIS — M25662 Stiffness of left knee, not elsewhere classified: Secondary | ICD-10-CM | POA: Diagnosis not present

## 2018-12-20 DIAGNOSIS — M1712 Unilateral primary osteoarthritis, left knee: Secondary | ICD-10-CM | POA: Diagnosis not present

## 2018-12-20 DIAGNOSIS — M25562 Pain in left knee: Secondary | ICD-10-CM | POA: Diagnosis not present

## 2018-12-20 DIAGNOSIS — M6281 Muscle weakness (generalized): Secondary | ICD-10-CM | POA: Diagnosis not present

## 2018-12-20 DIAGNOSIS — G8929 Other chronic pain: Secondary | ICD-10-CM | POA: Diagnosis not present

## 2018-12-22 ENCOUNTER — Other Ambulatory Visit: Payer: Self-pay | Admitting: Internal Medicine

## 2018-12-22 DIAGNOSIS — M6281 Muscle weakness (generalized): Secondary | ICD-10-CM | POA: Diagnosis not present

## 2018-12-22 DIAGNOSIS — M25662 Stiffness of left knee, not elsewhere classified: Secondary | ICD-10-CM | POA: Diagnosis not present

## 2018-12-22 DIAGNOSIS — G8929 Other chronic pain: Secondary | ICD-10-CM | POA: Diagnosis not present

## 2018-12-22 DIAGNOSIS — M25562 Pain in left knee: Secondary | ICD-10-CM | POA: Diagnosis not present

## 2018-12-27 DIAGNOSIS — M25662 Stiffness of left knee, not elsewhere classified: Secondary | ICD-10-CM | POA: Diagnosis not present

## 2018-12-27 DIAGNOSIS — G8929 Other chronic pain: Secondary | ICD-10-CM | POA: Diagnosis not present

## 2018-12-27 DIAGNOSIS — M6281 Muscle weakness (generalized): Secondary | ICD-10-CM | POA: Diagnosis not present

## 2018-12-27 DIAGNOSIS — M25562 Pain in left knee: Secondary | ICD-10-CM | POA: Diagnosis not present

## 2018-12-29 DIAGNOSIS — G8929 Other chronic pain: Secondary | ICD-10-CM | POA: Diagnosis not present

## 2018-12-29 DIAGNOSIS — M6281 Muscle weakness (generalized): Secondary | ICD-10-CM | POA: Diagnosis not present

## 2018-12-29 DIAGNOSIS — M25562 Pain in left knee: Secondary | ICD-10-CM | POA: Diagnosis not present

## 2018-12-29 DIAGNOSIS — M25662 Stiffness of left knee, not elsewhere classified: Secondary | ICD-10-CM | POA: Diagnosis not present

## 2019-01-04 DIAGNOSIS — M1712 Unilateral primary osteoarthritis, left knee: Secondary | ICD-10-CM | POA: Diagnosis not present

## 2019-01-04 DIAGNOSIS — M25562 Pain in left knee: Secondary | ICD-10-CM | POA: Diagnosis not present

## 2019-01-04 DIAGNOSIS — M6281 Muscle weakness (generalized): Secondary | ICD-10-CM | POA: Diagnosis not present

## 2019-01-04 DIAGNOSIS — M25662 Stiffness of left knee, not elsewhere classified: Secondary | ICD-10-CM | POA: Diagnosis not present

## 2019-01-04 DIAGNOSIS — G8929 Other chronic pain: Secondary | ICD-10-CM | POA: Diagnosis not present

## 2019-01-10 DIAGNOSIS — M6281 Muscle weakness (generalized): Secondary | ICD-10-CM | POA: Diagnosis not present

## 2019-01-10 DIAGNOSIS — M25562 Pain in left knee: Secondary | ICD-10-CM | POA: Diagnosis not present

## 2019-01-10 DIAGNOSIS — M25662 Stiffness of left knee, not elsewhere classified: Secondary | ICD-10-CM | POA: Diagnosis not present

## 2019-01-10 DIAGNOSIS — G8929 Other chronic pain: Secondary | ICD-10-CM | POA: Diagnosis not present

## 2019-01-11 DIAGNOSIS — M25562 Pain in left knee: Secondary | ICD-10-CM | POA: Diagnosis not present

## 2019-01-11 DIAGNOSIS — M1712 Unilateral primary osteoarthritis, left knee: Secondary | ICD-10-CM | POA: Diagnosis not present

## 2019-01-11 DIAGNOSIS — M84469D Pathological fracture, unspecified tibia and fibula, subsequent encounter for fracture with routine healing: Secondary | ICD-10-CM | POA: Diagnosis not present

## 2019-01-11 DIAGNOSIS — M7052 Other bursitis of knee, left knee: Secondary | ICD-10-CM | POA: Diagnosis not present

## 2019-01-11 DIAGNOSIS — M7122 Synovial cyst of popliteal space [Baker], left knee: Secondary | ICD-10-CM | POA: Diagnosis not present

## 2019-01-11 DIAGNOSIS — M23222 Derangement of posterior horn of medial meniscus due to old tear or injury, left knee: Secondary | ICD-10-CM | POA: Diagnosis not present

## 2019-01-11 DIAGNOSIS — G8929 Other chronic pain: Secondary | ICD-10-CM | POA: Diagnosis not present

## 2019-01-17 DIAGNOSIS — J4541 Moderate persistent asthma with (acute) exacerbation: Secondary | ICD-10-CM | POA: Diagnosis not present

## 2019-01-18 ENCOUNTER — Telehealth: Payer: Self-pay

## 2019-01-18 DIAGNOSIS — M6281 Muscle weakness (generalized): Secondary | ICD-10-CM | POA: Diagnosis not present

## 2019-01-18 DIAGNOSIS — M25562 Pain in left knee: Secondary | ICD-10-CM | POA: Diagnosis not present

## 2019-01-18 DIAGNOSIS — G8929 Other chronic pain: Secondary | ICD-10-CM | POA: Diagnosis not present

## 2019-01-18 NOTE — Telephone Encounter (Signed)
Called and spoke to pt.  Pt said that her bp usually is 120/80 but bp has been elevated since Feb 25.  Pt said that she takes her bp daily and gave the following readings:  Feb.25-149/90, Feb. 26-153/77, Feb. 27-150/85, March 1-131/86, March 2-153/77 (taken yesterday @ [facility] while at an appt for a Xolair injection), and today March 3-150/92.  Pt denies having shortness of breath and no chest pain.  Pt says that she is having some dizziness and has had a faint headache in the back of her head 2 or 3 mornings recently.  Pt could not sure about vision changes since she says she already has vision problems with cataracts and floaters.    CMA spoke to [place], LPN to schedule appt offered by Dr. Nicki Reaper on 01/21/19 @ 11:00 am and informed of pt symptoms.  Pt was ok to wait to be seen at that time.  Pt aware that appt has been scheduled.  Advised pt to go to ER if symptoms worsen.

## 2019-01-18 NOTE — Telephone Encounter (Signed)
Copied from [place] [id number]. Topic: Appointment Scheduling - Scheduling Inquiry for Clinic >> Jan 18, 2019  8:13 AM Scherrie Gerlach wrote: Reason for CRM: pt called to ask for appt with Dr Nicki Reaper to discuss bp issues.  Pt states her bp has been 150/90 and that is unusual for her. Pt declined to see another provider, and requested I send Dr Nicki Reaper a message and get her advice directly on this issue.  Please advise

## 2019-01-18 NOTE — Telephone Encounter (Signed)
Given persistent elevated blood pressure, needs to be evaluated.  Confirm no acute symptoms.  I can see her 01/21/19 at 11:00.  If acute problems, let me know.

## 2019-01-18 NOTE — Telephone Encounter (Signed)
Noted  

## 2019-01-18 NOTE — Telephone Encounter (Signed)
Patient returning call from [place]. Please return call on her cell phone.

## 2019-01-18 NOTE — Telephone Encounter (Signed)
Called patient to triage.  No answer.  LMTCB.

## 2019-01-21 ENCOUNTER — Ambulatory Visit (INDEPENDENT_AMBULATORY_CARE_PROVIDER_SITE_OTHER): Payer: PPO | Admitting: Internal Medicine

## 2019-01-21 ENCOUNTER — Encounter: Payer: Self-pay | Admitting: Internal Medicine

## 2019-01-21 ENCOUNTER — Other Ambulatory Visit: Payer: Self-pay | Admitting: Internal Medicine

## 2019-01-21 VITALS — BP 138/80 | HR 100 | Temp 97.9°F | Resp 16 | Wt 176.4 lb

## 2019-01-21 DIAGNOSIS — I10 Essential (primary) hypertension: Secondary | ICD-10-CM

## 2019-01-21 DIAGNOSIS — K219 Gastro-esophageal reflux disease without esophagitis: Secondary | ICD-10-CM | POA: Diagnosis not present

## 2019-01-21 DIAGNOSIS — R8281 Pyuria: Secondary | ICD-10-CM

## 2019-01-21 DIAGNOSIS — R739 Hyperglycemia, unspecified: Secondary | ICD-10-CM | POA: Diagnosis not present

## 2019-01-21 DIAGNOSIS — J4520 Mild intermittent asthma, uncomplicated: Secondary | ICD-10-CM | POA: Diagnosis not present

## 2019-01-21 LAB — URINALYSIS, ROUTINE W REFLEX MICROSCOPIC
Bilirubin Urine: NEGATIVE
Hgb urine dipstick: NEGATIVE
KETONES UR: NEGATIVE
NITRITE: POSITIVE — AB
PH: 7 (ref 5.0–8.0)
Specific Gravity, Urine: 1.02 (ref 1.000–1.030)
URINE GLUCOSE: NEGATIVE
UROBILINOGEN UA: 0.2 (ref 0.0–1.0)

## 2019-01-21 LAB — BASIC METABOLIC PANEL
BUN: 19 mg/dL (ref 6–23)
CALCIUM: 10 mg/dL (ref 8.4–10.5)
CO2: 26 mEq/L (ref 19–32)
Chloride: 105 mEq/L (ref 96–112)
Creatinine, Ser: 0.84 mg/dL (ref 0.40–1.20)
GFR: 67.17 mL/min (ref >60.00)
GLUCOSE: 125 mg/dL — AB (ref 70–99)
POTASSIUM: 3.9 meq/L (ref 3.5–5.1)
SODIUM: 139 meq/L (ref 135–145)

## 2019-01-21 MED ORDER — LOSARTAN POTASSIUM 50 MG PO TABS: 50.0000 mg | ORAL_TABLET | Freq: Every day | ORAL | 1 refills | Status: DC

## 2019-01-21 NOTE — Progress Notes (Signed)
Patient ID: Chloe Moyer, female   DOB: [birth year], 68 y.o.   MRN: [id number]   Subjective:    Patient ID: Chloe Moyer, female    DOB: April 19, 1951, 68 y.o.   MRN: [id number]  HPI  Patient here as a work in with concerns regarding elevated blood pressure.  She has been follow her pressures.  States has been averaging 147-153/77-92. Taking meloxicam.  This is not a new medication, but she did only take it one day this week.  Feels she needs the meloxicam to help her function and get around.  Some increased stress with her granddaughter's health issues.  Has been worried about this.  Was questioning if this could be contributing to the elevation in her pressure.  No chest pain.  No sob.  Had a tiny headache when woke up one morning.  No other headache.  Had minimal sensation of light headedness on two occasions.  Only lasted brief period and resolved.  No persistent dizziness or light headedness.  No vision change.  No acid reflux.  No abdominal pain.  Bowels moving.  Overall feels good.     Past Medical History:  Diagnosis Date  . Allergic rhinitis   . Arthritis   . Asthma   . Diverticulosis, sigmoid   . GERD (gastroesophageal reflux disease)   . History of hiatal hernia   . Hypertension 06-12-2005  . Ulcer    Past Surgical History:  Procedure Laterality Date  . BREAST BIOPSY Right 09/01/2017   Affirm Bx of 2 areas- benign  . BREAST CYST ASPIRATION Left 1990  . CARDIAC CATHETERIZATION  July 2012  . CHOLECYSTECTOMY  1991  . LIPOMA EXCISION  4/99   Left flank area  . NASAL SINUS SURGERY  July 2012  . TUBAL LIGATION  1984   Family History  Problem Relation Age of Onset  . Emphysema Father        smoked  . Asthma Father   . Lung cancer Father        smoked  . Hypertension Father   . Hypertension Mother   . Breast cancer Maternal Grandmother 60  . Heart disease Paternal Grandfather        myocardial infarction  . Stroke Maternal Grandfather    Social History    Socioeconomic History  . Marital status: Married    Spouse name: Not on file  . Number of children: 2  . Years of education: Not on file  . Highest education level: Not on file  Occupational History  . Occupation: Works at [employer]  Social Needs  . Financial resource strain: Not on file  . Food insecurity:    Worry: Not on file    Inability: Not on file  . Transportation needs:    Medical: Not on file    Non-medical: Not on file  Tobacco Use  . Smoking status: Never Smoker  . Smokeless tobacco: Never Used  Substance and Sexual Activity  . Alcohol use: Yes    Alcohol/week: 0.0 standard drinks    Comment: occ wine with dinner  . Drug use: No  . Sexual activity: Yes  Lifestyle  . Physical activity:    Days per week: Not on file    Minutes per session: Not on file  . Stress: Not on file  Relationships  . Social connections:    Talks on phone: Not on file    Gets together: Not on file    Attends religious service: Not on file  Active member of club or organization: Not on file    Attends meetings of clubs or organizations: Not on file    Relationship status: Not on file  Other Topics Concern  . Not on file  Social History Narrative  . Not on file    Outpatient Encounter Medications as of 01/21/2019  Medication Sig  . albuterol (PROVENTIL HFA;VENTOLIN HFA) 108 (90 BASE) MCG/ACT inhaler Inhale 2 puffs using inhaler every 4-6 hours as needed for SOB/wheeze.  . budesonide (PULMICORT) 0.5 MG/2ML nebulizer solution Take 0.5 mg by nebulization 2 (two) times daily.  . budesonide-formoterol (SYMBICORT) 160-4.5 MCG/ACT inhaler Inhale 2 puffs into the lungs daily. as directed.  Marland Kitchen EPINEPHrine (EPIPEN 2-PAK) 0.3 mg/0.3 mL IJ SOAJ injection Inject 0.3 mLs (0.3 mg total) into the muscle once.  . hydrocortisone (ANUSOL-HC) 25 MG suppository Place 1 suppository (25 mg total) rectally 2 (two) times daily as needed for hemorrhoids or itching.  . hydrOXYzine (ATARAX/VISTARIL) 25  MG tablet take 1 tablet by mouth once daily if needed  . Hypertonic Nasal Wash (SINUS RINSE NA) As directed three times daily   . losartan (COZAAR) 50 MG tablet Take 1 tablet (50 mg total) by mouth daily.  . meloxicam (MOBIC) 15 MG tablet TAKE 1 TABLET BY MOUTH ONCE DAILY AS NEEDED  . montelukast (SINGULAIR) 10 MG tablet Take 10 mg by mouth at bedtime.  Marland Kitchen omalizumab (XOLAIR) 150 MG injection 150 mg every 30 (thirty) days.   Marland Kitchen omeprazole (PRILOSEC) 10 MG capsule Take 1 capsule (10 mg total) by mouth daily.  . [DISCONTINUED] losartan (COZAAR) 25 MG tablet Take 1 tablet (25 mg total) by mouth daily. Hold until pt request refill.   No facility-administered encounter medications on file as of 01/21/2019.     Review of Systems  Constitutional: Negative for appetite change and unexpected weight change.  HENT: Negative for congestion and sinus pressure.   Respiratory: Negative for cough, chest tightness and shortness of breath.   Cardiovascular: Negative for chest pain, palpitations and leg swelling.  Gastrointestinal: Negative for abdominal pain, diarrhea, nausea and vomiting.  Genitourinary: Negative for difficulty urinating and dysuria.  Musculoskeletal: Negative for joint swelling and myalgias.  Skin: Negative for color change and rash.  Neurological:       No headache now.  No dizziness or light headedness now.    Psychiatric/Behavioral: Negative for agitation and dysphoric mood.       Objective:    Physical Exam Constitutional:      General: She is not in acute distress.    Appearance: Normal appearance.  HENT:     Nose: Nose normal. No congestion.     Mouth/Throat:     Pharynx: No oropharyngeal exudate or posterior oropharyngeal erythema.  Neck:     Musculoskeletal: Neck supple. No muscular tenderness.     Thyroid: No thyromegaly.  Cardiovascular:     Rate and Rhythm: Normal rate and regular rhythm.  Pulmonary:     Effort: No respiratory distress.     Breath sounds: Normal  breath sounds. No wheezing.  Abdominal:     General: Bowel sounds are normal.     Palpations: Abdomen is soft.     Tenderness: There is no abdominal tenderness.  Musculoskeletal:        General: No swelling or tenderness.  Lymphadenopathy:     Cervical: No cervical adenopathy.  Skin:    Findings: No erythema or rash.  Neurological:     Mental Status: She is alert.  Psychiatric:  Mood and Affect: Mood normal.        Behavior: Behavior normal.     BP 138/80   Pulse 100   Temp 97.9 F (36.6 C) (Oral)   Resp 16   Wt 176 lb 6.4 oz (80 kg)   LMP 11/03/1996   SpO2 96%   BMI 28.47 kg/m  Wt Readings from Last 3 Encounters:  01/21/19 176 lb 6.4 oz (80 kg)  10/28/18 176 lb 9.6 oz (80.1 kg)  11/14/18 175 lb (79.4 kg)     Lab Results  Component Value Date   WBC 6.7 10/27/2018   HGB 13.5 10/27/2018   HCT 40.5 10/27/2018   PLT 240.0 10/27/2018   GLUCOSE 125 (H) 01/21/2019   CHOL 168 10/27/2018   TRIG 59.0 10/27/2018   HDL 61.50 10/27/2018   LDLCALC 95 10/27/2018   ALT 32 10/27/2018   AST 28 10/27/2018   NA 139 01/21/2019   K 3.9 01/21/2019   CL 105 01/21/2019   CREATININE 0.84 01/21/2019   BUN 19 01/21/2019   CO2 26 01/21/2019   TSH 2.87 02/15/2018   INR 0.94 01/07/2017   HGBA1C 5.9 10/27/2018    Mr Knee Left Wo Contrast  Result Date: 11/15/2018 CLINICAL DATA:  Chronic knee pain. EXAM: MRI OF THE LEFT KNEE WITHOUT CONTRAST TECHNIQUE: Multiplanar, multisequence MR imaging of the knee was performed. No intravenous contrast was administered. COMPARISON:  None. FINDINGS: MENISCI Medial meniscus:  Radial tear of the posterior horn. Lateral meniscus:  Intact. LIGAMENTS Cruciates:  Intact ACL and PCL.  Mucoid degeneration of the ACL. Collaterals: Medial collateral ligament is intact. Lateral collateral ligament complex is intact. CARTILAGE Patellofemoral: Mild diffuse cartilage thinning with small focal full-thickness defect over the superior trochlear groove. Medial:   Mild partial-thickness cartilage loss. Lateral:  Mild partial-thickness cartilage loss. Joint: Small joint effusion. Normal Hoffa's fat. No plical thickening. Popliteal Fossa:  Small Baker cyst.  Intact popliteus tendon. Extensor Mechanism: Intact quadriceps tendon and patellar tendon. Intact medial and lateral patellar retinaculum. Intact MPFL. Bones: Subchondral insufficiency fracture of the medial tibial plateau with surrounding marrow edema. No dislocation. Small tricompartmental osteophytes. Degenerative marrow edema in the anterior medial femoral condyle. No focal bone lesion. Other: None. IMPRESSION: 1. Radial tear of the medial meniscus posterior horn. 2. Subchondral insufficiency fracture of the medial tibial plateau. 3. Mild tricompartmental osteoarthritis. 4. Small joint effusion and Baker cyst. Electronically Signed   By: Titus Dubin M.D.   On: 11/15/2018 08:19       Assessment & Plan:   Problem List Items Addressed This Visit    Asthma    Breathing stable.  Followed by pulmonary.       GERD (gastroesophageal reflux disease)    Controlled on current regimen.        Hyperglycemia    Low carb diet and exercise.  Follow met b and a1c.        Hypertension - Primary    Blood pressure elevated as outlined.  Checks here better. BP on my check 128/80.  Given increase and variation of blood pressure, will increase losartan to '50mg'$  q day.  Follow pressures.  Follow metabolic panel.        Relevant Medications   losartan (COZAAR) 50 MG tablet   Other Relevant Orders   Basic metabolic panel (Completed)   Urinalysis, Routine w reflex microscopic (Completed)    Other Visit Diagnoses    Pyuria       Relevant Orders   Urine Culture  Einar Pheasant, MD

## 2019-01-23 ENCOUNTER — Encounter: Payer: Self-pay | Admitting: Internal Medicine

## 2019-01-23 NOTE — Assessment & Plan Note (Signed)
Breathing stable.  Followed by pulmonary.

## 2019-01-23 NOTE — Assessment & Plan Note (Signed)
Controlled on current regimen.   

## 2019-01-23 NOTE — Assessment & Plan Note (Signed)
Blood pressure elevated as outlined.  Checks here better. BP on my check 128/80.  Given increase and variation of blood pressure, will increase losartan to 50mg  q day.  Follow pressures.  Follow metabolic panel.

## 2019-01-23 NOTE — Assessment & Plan Note (Signed)
Low carb diet and exercise.  Follow met b and a1c.   

## 2019-01-24 ENCOUNTER — Encounter: Payer: Self-pay | Admitting: *Deleted

## 2019-01-24 ENCOUNTER — Other Ambulatory Visit: Payer: Self-pay | Admitting: Internal Medicine

## 2019-01-24 DIAGNOSIS — R300 Dysuria: Secondary | ICD-10-CM

## 2019-01-24 DIAGNOSIS — R7989 Other specified abnormal findings of blood chemistry: Secondary | ICD-10-CM

## 2019-01-24 NOTE — Progress Notes (Signed)
Order placed for f/u labs.  

## 2019-01-25 ENCOUNTER — Other Ambulatory Visit (INDEPENDENT_AMBULATORY_CARE_PROVIDER_SITE_OTHER): Payer: PPO

## 2019-01-25 DIAGNOSIS — R300 Dysuria: Secondary | ICD-10-CM | POA: Diagnosis not present

## 2019-01-25 DIAGNOSIS — R7989 Other specified abnormal findings of blood chemistry: Secondary | ICD-10-CM

## 2019-01-25 LAB — MICROALBUMIN / CREATININE URINE RATIO
CREATININE, U: 153.5 mg/dL
MICROALB UR: 6.5 mg/dL — AB (ref 0.0–1.9)
Microalb Creat Ratio: 4.2 mg/g (ref 0.0–30.0)

## 2019-01-25 MED ORDER — CEFDINIR 300 MG PO CAPS: 300.0000 mg | ORAL_CAPSULE | Freq: Two times a day (BID) | ORAL | 0 refills | Status: DC

## 2019-01-25 NOTE — Telephone Encounter (Signed)
Pt notified of urine results.  Having urinary odor and dysuria.  Will come in and leave urine for a urine culture.  omnicef sent in to pharmacy for treatment given symptoms and urinalysis results.

## 2019-01-27 LAB — URINE CULTURE
MICRO NUMBER:: 299667
SPECIMEN QUALITY:: ADEQUATE

## 2019-02-16 DIAGNOSIS — E039 Hypothyroidism, unspecified: Secondary | ICD-10-CM | POA: Diagnosis not present

## 2019-02-16 DIAGNOSIS — J4541 Moderate persistent asthma with (acute) exacerbation: Secondary | ICD-10-CM | POA: Diagnosis not present

## 2019-02-16 DIAGNOSIS — D473 Essential (hemorrhagic) thrombocythemia: Secondary | ICD-10-CM | POA: Diagnosis not present

## 2019-02-16 DIAGNOSIS — M533 Sacrococcygeal disorders, not elsewhere classified: Secondary | ICD-10-CM | POA: Diagnosis not present

## 2019-02-16 DIAGNOSIS — Z79899 Other long term (current) drug therapy: Secondary | ICD-10-CM | POA: Diagnosis not present

## 2019-02-17 ENCOUNTER — Other Ambulatory Visit: Payer: Self-pay

## 2019-02-17 ENCOUNTER — Encounter: Payer: Self-pay | Admitting: Internal Medicine

## 2019-02-17 ENCOUNTER — Other Ambulatory Visit (INDEPENDENT_AMBULATORY_CARE_PROVIDER_SITE_OTHER): Payer: PPO

## 2019-02-17 DIAGNOSIS — R7989 Other specified abnormal findings of blood chemistry: Secondary | ICD-10-CM

## 2019-02-17 DIAGNOSIS — R8281 Pyuria: Secondary | ICD-10-CM

## 2019-02-17 DIAGNOSIS — C679 Malignant neoplasm of bladder, unspecified: Secondary | ICD-10-CM | POA: Diagnosis not present

## 2019-02-17 DIAGNOSIS — I10 Essential (primary) hypertension: Secondary | ICD-10-CM | POA: Diagnosis not present

## 2019-02-17 DIAGNOSIS — R262 Difficulty in walking, not elsewhere classified: Secondary | ICD-10-CM | POA: Diagnosis not present

## 2019-02-17 DIAGNOSIS — N3289 Other specified disorders of bladder: Secondary | ICD-10-CM | POA: Diagnosis not present

## 2019-02-17 DIAGNOSIS — Z79899 Other long term (current) drug therapy: Secondary | ICD-10-CM | POA: Diagnosis not present

## 2019-02-17 DIAGNOSIS — T148XXA Other injury of unspecified body region, initial encounter: Secondary | ICD-10-CM | POA: Diagnosis not present

## 2019-02-17 DIAGNOSIS — R945 Abnormal results of liver function studies: Secondary | ICD-10-CM

## 2019-02-17 DIAGNOSIS — R739 Hyperglycemia, unspecified: Secondary | ICD-10-CM | POA: Diagnosis not present

## 2019-02-17 DIAGNOSIS — R5381 Other malaise: Secondary | ICD-10-CM | POA: Diagnosis not present

## 2019-02-17 DIAGNOSIS — D473 Essential (hemorrhagic) thrombocythemia: Secondary | ICD-10-CM | POA: Diagnosis not present

## 2019-02-17 DIAGNOSIS — M25551 Pain in right hip: Secondary | ICD-10-CM | POA: Diagnosis not present

## 2019-02-17 DIAGNOSIS — M6281 Muscle weakness (generalized): Secondary | ICD-10-CM | POA: Diagnosis not present

## 2019-02-17 LAB — HEPATIC FUNCTION PANEL
ALT: 27 U/L (ref 0–35)
AST: 31 U/L (ref 0–37)
Albumin: 4.4 g/dL (ref 3.5–5.2)
Alkaline Phosphatase: 58 U/L (ref 39–117)
Bilirubin, Direct: 0.1 mg/dL (ref 0.0–0.3)
Total Bilirubin: 0.6 mg/dL (ref 0.2–1.2)
Total Protein: 7.3 g/dL (ref 6.0–8.3)

## 2019-02-17 LAB — BASIC METABOLIC PANEL
BUN: 11 mg/dL (ref 6–23)
CO2: 27 mEq/L (ref 19–32)
Calcium: 10 mg/dL (ref 8.4–10.5)
Chloride: 105 mEq/L (ref 96–112)
Creatinine, Ser: 0.66 mg/dL (ref 0.40–1.20)
GFR: 88.71 mL/min (ref >60.00)
Glucose, Bld: 88 mg/dL (ref 70–99)
Potassium: 4.1 mEq/L (ref 3.5–5.1)
Sodium: 139 mEq/L (ref 135–145)

## 2019-02-17 LAB — LIPID PANEL
Cholesterol: 169 mg/dL (ref 0–200)
HDL: 45.3 mg/dL (ref >39.00)
LDL Cholesterol: 105 mg/dL — ABNORMAL HIGH (ref 0–99)
NonHDL: 123.87
Total CHOL/HDL Ratio: 4
Triglycerides: 94 mg/dL (ref 0.0–149.0)
VLDL: 18.8 mg/dL (ref 0.0–40.0)

## 2019-02-17 LAB — HEMOGLOBIN A1C: Hgb A1c MFr Bld: 5.8 % (ref 4.6–6.5)

## 2019-02-17 LAB — TSH: TSH: 4.04 u[IU]/mL (ref 0.35–4.50)

## 2019-02-17 NOTE — Addendum Note (Signed)
Addended by: Arby Barrette on: 02/17/2019 08:07 AM   Modules accepted: Orders

## 2019-02-18 LAB — URINE CULTURE
MICRO NUMBER:: 371282
SPECIMEN QUALITY:: ADEQUATE

## 2019-02-20 ENCOUNTER — Encounter: Payer: Self-pay | Admitting: Internal Medicine

## 2019-02-21 ENCOUNTER — Telehealth: Payer: Self-pay | Admitting: Internal Medicine

## 2019-02-21 NOTE — Telephone Encounter (Signed)
Pt is out of her meloxicam (MOBIC) 15 MG tablet and needs a refill.

## 2019-02-21 NOTE — Telephone Encounter (Signed)
Pt checked her cabinet and has the refill from March 9. Does not need refill at this time

## 2019-02-22 ENCOUNTER — Other Ambulatory Visit: Payer: Self-pay | Admitting: Internal Medicine

## 2019-02-22 NOTE — Telephone Encounter (Signed)
Last OV 01/21/2019  Last refilled 01/24/2019 disp 30 with no refills   Next OV 07/26/2019   Sent to PCP for approval

## 2019-02-24 ENCOUNTER — Encounter: Payer: PPO | Admitting: Internal Medicine

## 2019-03-03 DIAGNOSIS — J4550 Severe persistent asthma, uncomplicated: Secondary | ICD-10-CM | POA: Diagnosis not present

## 2019-03-22 DIAGNOSIS — J4541 Moderate persistent asthma with (acute) exacerbation: Secondary | ICD-10-CM | POA: Diagnosis not present

## 2019-04-04 ENCOUNTER — Other Ambulatory Visit: Payer: Self-pay | Admitting: Internal Medicine

## 2019-04-04 MED ORDER — MELOXICAM 15 MG PO TABS: 15.0000 mg | ORAL_TABLET | Freq: Every day | ORAL | 0 refills | Status: DC | PRN

## 2019-04-19 DIAGNOSIS — J4541 Moderate persistent asthma with (acute) exacerbation: Secondary | ICD-10-CM | POA: Diagnosis not present

## 2019-05-03 ENCOUNTER — Other Ambulatory Visit: Payer: Self-pay | Admitting: Internal Medicine

## 2019-05-03 DIAGNOSIS — L578 Other skin changes due to chronic exposure to nonionizing radiation: Secondary | ICD-10-CM | POA: Diagnosis not present

## 2019-05-03 DIAGNOSIS — L918 Other hypertrophic disorders of the skin: Secondary | ICD-10-CM | POA: Diagnosis not present

## 2019-05-03 DIAGNOSIS — L570 Actinic keratosis: Secondary | ICD-10-CM | POA: Diagnosis not present

## 2019-05-03 DIAGNOSIS — Z1283 Encounter for screening for malignant neoplasm of skin: Secondary | ICD-10-CM | POA: Diagnosis not present

## 2019-05-03 DIAGNOSIS — L680 Hirsutism: Secondary | ICD-10-CM | POA: Diagnosis not present

## 2019-05-11 DIAGNOSIS — M25462 Effusion, left knee: Secondary | ICD-10-CM | POA: Diagnosis not present

## 2019-05-11 DIAGNOSIS — M23222 Derangement of posterior horn of medial meniscus due to old tear or injury, left knee: Secondary | ICD-10-CM | POA: Diagnosis not present

## 2019-05-11 DIAGNOSIS — M1712 Unilateral primary osteoarthritis, left knee: Secondary | ICD-10-CM | POA: Diagnosis not present

## 2019-05-11 DIAGNOSIS — M7122 Synovial cyst of popliteal space [Baker], left knee: Secondary | ICD-10-CM | POA: Diagnosis not present

## 2019-05-11 DIAGNOSIS — M25562 Pain in left knee: Secondary | ICD-10-CM | POA: Diagnosis not present

## 2019-05-11 DIAGNOSIS — G8929 Other chronic pain: Secondary | ICD-10-CM | POA: Diagnosis not present

## 2019-05-17 DIAGNOSIS — J4541 Moderate persistent asthma with (acute) exacerbation: Secondary | ICD-10-CM | POA: Diagnosis not present

## 2019-05-24 DIAGNOSIS — M23222 Derangement of posterior horn of medial meniscus due to old tear or injury, left knee: Secondary | ICD-10-CM | POA: Diagnosis not present

## 2019-05-24 DIAGNOSIS — M7122 Synovial cyst of popliteal space [Baker], left knee: Secondary | ICD-10-CM | POA: Diagnosis not present

## 2019-05-24 DIAGNOSIS — M1712 Unilateral primary osteoarthritis, left knee: Secondary | ICD-10-CM | POA: Diagnosis not present

## 2019-05-24 DIAGNOSIS — M25462 Effusion, left knee: Secondary | ICD-10-CM | POA: Diagnosis not present

## 2019-05-24 DIAGNOSIS — M25562 Pain in left knee: Secondary | ICD-10-CM | POA: Diagnosis not present

## 2019-05-24 DIAGNOSIS — G8929 Other chronic pain: Secondary | ICD-10-CM | POA: Diagnosis not present

## 2019-05-31 DIAGNOSIS — M25462 Effusion, left knee: Secondary | ICD-10-CM | POA: Diagnosis not present

## 2019-05-31 DIAGNOSIS — M7122 Synovial cyst of popliteal space [Baker], left knee: Secondary | ICD-10-CM | POA: Diagnosis not present

## 2019-05-31 DIAGNOSIS — M25562 Pain in left knee: Secondary | ICD-10-CM | POA: Diagnosis not present

## 2019-05-31 DIAGNOSIS — G8929 Other chronic pain: Secondary | ICD-10-CM | POA: Diagnosis not present

## 2019-05-31 DIAGNOSIS — M1712 Unilateral primary osteoarthritis, left knee: Secondary | ICD-10-CM | POA: Diagnosis not present

## 2019-05-31 DIAGNOSIS — M23222 Derangement of posterior horn of medial meniscus due to old tear or injury, left knee: Secondary | ICD-10-CM | POA: Diagnosis not present

## 2019-06-07 DIAGNOSIS — M25462 Effusion, left knee: Secondary | ICD-10-CM | POA: Diagnosis not present

## 2019-06-07 DIAGNOSIS — M25562 Pain in left knee: Secondary | ICD-10-CM | POA: Diagnosis not present

## 2019-06-07 DIAGNOSIS — M1712 Unilateral primary osteoarthritis, left knee: Secondary | ICD-10-CM | POA: Diagnosis not present

## 2019-06-07 DIAGNOSIS — G8929 Other chronic pain: Secondary | ICD-10-CM | POA: Diagnosis not present

## 2019-06-23 DIAGNOSIS — J4541 Moderate persistent asthma with (acute) exacerbation: Secondary | ICD-10-CM | POA: Diagnosis not present

## 2019-07-20 DIAGNOSIS — J4541 Moderate persistent asthma with (acute) exacerbation: Secondary | ICD-10-CM | POA: Diagnosis not present

## 2019-07-26 ENCOUNTER — Ambulatory Visit (INDEPENDENT_AMBULATORY_CARE_PROVIDER_SITE_OTHER): Payer: PPO

## 2019-07-26 ENCOUNTER — Other Ambulatory Visit: Payer: Self-pay | Admitting: Internal Medicine

## 2019-07-26 ENCOUNTER — Ambulatory Visit (INDEPENDENT_AMBULATORY_CARE_PROVIDER_SITE_OTHER): Payer: PPO | Admitting: Internal Medicine

## 2019-07-26 ENCOUNTER — Ambulatory Visit: Payer: PPO

## 2019-07-26 ENCOUNTER — Other Ambulatory Visit: Payer: Self-pay

## 2019-07-26 ENCOUNTER — Encounter: Payer: Self-pay | Admitting: Internal Medicine

## 2019-07-26 VITALS — BP 132/80 | HR 83 | Temp 95.8°F | Resp 16 | Wt 177.6 lb

## 2019-07-26 DIAGNOSIS — Z0000 Encounter for general adult medical examination without abnormal findings: Secondary | ICD-10-CM

## 2019-07-26 DIAGNOSIS — Z23 Encounter for immunization: Secondary | ICD-10-CM | POA: Diagnosis not present

## 2019-07-26 DIAGNOSIS — J4520 Mild intermittent asthma, uncomplicated: Secondary | ICD-10-CM | POA: Diagnosis not present

## 2019-07-26 DIAGNOSIS — R739 Hyperglycemia, unspecified: Secondary | ICD-10-CM | POA: Diagnosis not present

## 2019-07-26 DIAGNOSIS — R945 Abnormal results of liver function studies: Secondary | ICD-10-CM | POA: Diagnosis not present

## 2019-07-26 DIAGNOSIS — R7989 Other specified abnormal findings of blood chemistry: Secondary | ICD-10-CM

## 2019-07-26 DIAGNOSIS — I10 Essential (primary) hypertension: Secondary | ICD-10-CM | POA: Diagnosis not present

## 2019-07-26 DIAGNOSIS — Z1231 Encounter for screening mammogram for malignant neoplasm of breast: Secondary | ICD-10-CM

## 2019-07-26 DIAGNOSIS — K219 Gastro-esophageal reflux disease without esophagitis: Secondary | ICD-10-CM

## 2019-07-26 MED ORDER — LOSARTAN POTASSIUM 100 MG PO TABS: 100.0000 mg | ORAL_TABLET | Freq: Every day | ORAL | 2 refills | Status: DC

## 2019-07-26 NOTE — Assessment & Plan Note (Addendum)
Physical today 07/26/19.  Colonoscopy 03/2014.  Recommended f/u in 5 years.  She will call to schedule.  Mammogram 09/02/18 with f/u left breast mammogram 09/16/18 - Birads I.  Schedule f/u mammogram.

## 2019-07-26 NOTE — Progress Notes (Addendum)
Subjective:   Chloe Moyer is a 68 y.o. female who presents for Medicare Annual (Subsequent) preventive examination.  Review of Systems:  No ROS.  Medicare Wellness Virtual Visit.  Visual/audio telehealth visit, UTA vital signs.   See social history for additional risk factors.   Cardiac Risk Factors include: advanced age (>54men, >28 women);hypertension     Objective:     Vitals: LMP 11/03/1996   There is no height or weight on file to calculate BMI.  Advanced Directives 07/26/2019 07/20/2018 07/17/2017 01/07/2017 07/15/2016 02/18/2016 07/28/2015  Does Patient Have a Medical Advance Directive? Yes Yes No No No No No  Type of Paramedic of [place];Living will [place];Living will - - - - -  Does patient want to make changes to medical advance directive? No - Patient declined No - Patient declined - - - - -  Copy of [place] in Chart? No - copy requested No - copy requested - - - - -  Would patient like information on creating a medical advance directive? - - No - Patient declined Yes (MAU/Ambulatory/Procedural Areas - Information given) No - patient declined information - No - patient declined information    Tobacco Social History   Tobacco Use  Smoking Status Never Smoker  Smokeless Tobacco Never Used     Counseling given: Not Answered   Clinical Intake:  Pre-visit preparation completed: Yes        Diabetes: No  How often do you need to have someone help you when you read instructions, pamphlets, or other written materials from your doctor or pharmacy?: 1 - Never  Interpreter Needed?: No     Past Medical History:  Diagnosis Date  . Allergic rhinitis   . Arthritis   . Asthma   . Diverticulosis, sigmoid   . GERD (gastroesophageal reflux disease)   . History of hiatal hernia   . Hypertension 06-12-2005  . Ulcer    Past Surgical History:  Procedure Laterality Date  . BREAST BIOPSY Right  09/01/2017   Affirm Bx of 2 areas- benign  . BREAST CYST ASPIRATION Left 1990  . CARDIAC CATHETERIZATION  July 2012  . CHOLECYSTECTOMY  1991  . LIPOMA EXCISION  4/99   Left flank area  . NASAL SINUS SURGERY  July 2012  . TUBAL LIGATION  1984   Family History  Problem Relation Age of Onset  . Emphysema Father        smoked  . Asthma Father   . Lung cancer Father        smoked  . Hypertension Father   . Hypertension Mother   . Breast cancer Maternal Grandmother 60  . Heart disease Paternal Grandfather        myocardial infarction  . Stroke Maternal Grandfather    Social History   Socioeconomic History  . Marital status: Married    Spouse name: Not on file  . Number of children: 2  . Years of education: Not on file  . Highest education level: Not on file  Occupational History  . Occupation: Works at [employer]  Social Needs  . Financial resource strain: Not hard at all  . Food insecurity    Worry: Never true    Inability: Never true  . Transportation needs    Medical: No    Non-medical: No  Tobacco Use  . Smoking status: Never Smoker  . Smokeless tobacco: Never Used  Substance and Sexual Activity  .  Alcohol use: Yes    Alcohol/week: 0.0 standard drinks    Comment: occ wine with dinner  . Drug use: No  . Sexual activity: Yes  Lifestyle  . Physical activity    Days per week: Not on file    Minutes per session: Not on file  . Stress: Not at all  Relationships  . Social Herbalist on phone: Not on file    Gets together: Not on file    Attends religious service: Not on file    Active member of club or organization: Not on file    Attends meetings of clubs or organizations: Not on file    Relationship status: Not on file  Other Topics Concern  . Not on file  Social History Narrative  . Not on file    Outpatient Encounter Medications as of 07/26/2019  Medication Sig  . albuterol (PROVENTIL HFA;VENTOLIN HFA) 108 (90 BASE) MCG/ACT inhaler Inhale  2 puffs using inhaler every 4-6 hours as needed for SOB/wheeze.  . budesonide (PULMICORT) 0.5 MG/2ML nebulizer solution Take 0.5 mg by nebulization 2 (two) times daily.  . budesonide-formoterol (SYMBICORT) 160-4.5 MCG/ACT inhaler Inhale 2 puffs into the lungs daily. as directed.  Marland Kitchen EPINEPHrine (EPIPEN 2-PAK) 0.3 mg/0.3 mL IJ SOAJ injection Inject 0.3 mLs (0.3 mg total) into the muscle once.  . hydrocortisone (ANUSOL-HC) 25 MG suppository Place 1 suppository (25 mg total) rectally 2 (two) times daily as needed for hemorrhoids or itching.  Marland Kitchen Hypertonic Nasal Wash (SINUS RINSE NA) As directed three times daily   . losartan (COZAAR) 50 MG tablet TAKE 1 TABLET BY MOUTH ONCE DAILY  . meloxicam (MOBIC) 15 MG tablet Take 1 tablet (15 mg total) by mouth daily as needed.  Marland Kitchen omalizumab (XOLAIR) 150 MG injection 150 mg every 30 (thirty) days.   Marland Kitchen omeprazole (PRILOSEC) 10 MG capsule TAKE 1 CAPSULE BY MOUTH ONCE DAILY  . [DISCONTINUED] cefdinir (OMNICEF) 300 MG capsule Take 1 capsule (300 mg total) by mouth 2 (two) times daily.  . [DISCONTINUED] hydrOXYzine (ATARAX/VISTARIL) 25 MG tablet take 1 tablet by mouth once daily if needed  . [DISCONTINUED] montelukast (SINGULAIR) 10 MG tablet Take 10 mg by mouth at bedtime.   No facility-administered encounter medications on file as of 07/26/2019.     Activities of Daily Living In your present state of health, do you have any difficulty performing the following activities: 07/26/2019  Hearing? N  Vision? N  Difficulty concentrating or making decisions? N  Walking or climbing stairs? N  Dressing or bathing? N  Doing errands, shopping? N  Preparing Food and eating ? N  Using the Toilet? N  In the past six months, have you accidently leaked urine? N  Do you have problems with loss of bowel control? N  Managing your Medications? N  Managing your Finances? N  Housekeeping or managing your Housekeeping? N  Some recent data might be hidden    Patient Care Team:  Einar Pheasant, MD as PCP - General (Internal Medicine) Beverly Gust, MD as Referring Physician (Unknown Physician Specialty) Powers, Eloy End, MD as Referring Physician (Pulmonary Disease) Senior, Michel Santee, MD as Referring Physician (Otolaryngology)    Assessment:   This is a routine wellness examination for Chloe Moyer.  I connected with patient 07/26/19 at  9:00 AM EDT by a video/audio enabled telemedicine application and verified that I am speaking with the correct person using two identifiers. Patient stated full name and DOB. Patient gave permission to continue  with virtual visit. Patient's location was at home and Nurse's location was at [place] office.   Health Maintenance Due: Influenza vaccine 2020- discussed; to be completed in office with her doctor later today.    Colonoscopy- declined. She plans to wait until later in the season.  Update all pending maintenance due as appropriate.   See completed HM at the end of note.   Eye: Visual acuity not assessed. Virtual visit. Wears corrective lenses. Followed by their ophthalmologist every 12 months.   Dental: Visits every 6 months.    Hearing: Demonstrates normal hearing during visit.  Safety:  Patient feels safe at home- yes Patient does have smoke detectors at home- yes Patient does wear sunscreen or protective clothing when in direct sunlight - yes Patient does wear seat belt when in a moving vehicle - yes Patient drives- yes Adequate lighting in walkways free from debris- yes Grab bars and handrails used as appropriate- yes Ambulates with no assistive device Cell phone on person when ambulating outside of the home- yes  Social: Alcohol intake - yes, social      Smoking history- never   Smokers in home? none Illicit drug use? none  Depression: PHQ 2 &9 complete. See screening below. Denies irritability, anhedonia, sadness/tearfullness.  Stable.   Falls: See screening below.    Medication: Taking as  directed and without issues.   Covid-19: Precautions and sickness symptoms discussed. Wears mask, social distancing, hand hygiene as appropriate.   Activities of Daily Living Patient denies needing assistance with: household chores, feeding themselves, getting from bed to chair, getting to the toilet, bathing/showering, dressing, managing money, or preparing meals.   Memory: Patient is alert. Patient denies difficulty focusing or concentrating. Correctly identified the president of the [place], season and recall. Patient likes to make masks, complete a little word search and do some reading for brain stimulation.  BMI- discussed the importance of a healthy diet, water intake and the benefits of aerobic exercise.  Educational material provided.  Physical activity- walking, as tolerated.  Diet:  Regular Water: good intake Caffeine: 2-3 cups daily Ensure/Protein supplement: yes  Advanced Directive: End of life planning; Advance aging; Advanced directives discussed.  Copy of current HCPOA/Living Will requested.    Other Providers Patient Care Team: Einar Pheasant, MD as PCP - General (Internal Medicine) Beverly Gust, MD as Referring Physician (Unknown Physician Specialty) Powers, Eloy End, MD as Referring Physician (Pulmonary Disease) Senior, Michel Santee, MD as Referring Physician (Otolaryngology) Exercise Activities and Dietary recommendations Current Exercise Habits: Home exercise routine, Intensity: Mild  Goals    . Follow up with Primary Care Provider     As needed       Fall Risk Fall Risk  07/26/2019 07/20/2018 07/17/2017 07/15/2016 03/21/2015  Falls in the past year? 0 No Yes No No  Number falls in past yr: - - 1 - -  Injury with Fall? - - No - -  Comment - - Tripped over the cat - -  Follow up - - Falls prevention discussed - -   Timed Get Up and Go performed: no, virtual visit  Depression Screen PHQ 2/9 Scores 07/26/2019 07/20/2018 07/17/2017 07/15/2016  PHQ - 2 Score 0  0 0 0  PHQ- 9 Score - - 0 -     Cognitive Function MMSE - Mini Mental State Exam 07/20/2018 07/17/2017 07/15/2016 03/21/2015  Orientation to time 5 5 5 5   Orientation to Place 5 5 5 5   Registration 3 3 3  3  Attention/ Calculation 5 5 5 5   Recall 3 3 3 3   Language- name 2 objects 2 2 2 2   Language- repeat 1 1 1 1   Language- follow 3 step command 3 3 3 3   Language- read & follow direction 1 1 1 1   Write a sentence 1 1 1 1   Copy design 1 1 1 1   Total score 30 30 30 30      6CIT Screen 07/26/2019  What Year? 0 points  What month? 0 points  What time? 0 points  Count back from 20 0 points  Months in reverse 0 points  Repeat phrase 0 points  Total Score 0    Immunization History  Administered Date(s) Administered  . Influenza Split 09/27/2012, 09/27/2012  . Influenza, High Dose Seasonal PF 07/17/2017, 08/26/2018  . Influenza,inj,Quad PF,6+ Mos 07/28/2014, 08/21/2015  . Influenza-Unspecified 11/28/2011, 08/21/2015, 07/08/2016  . Pneumococcal Conjugate-13 03/22/2015  . Pneumococcal Polysaccharide-23 07/15/2016  . Tdap 04/28/2013  . Zoster 02/23/2014  . Zoster Recombinat (Shingrix) 07/21/2018    Qualifies for Shingles Vaccine?Yes. One series complete. She does not plan to complete the second injection due to a reaction of swelling to the site upon administration 1 year ago.   Screening Tests Health Maintenance  Topic Date Due  . INFLUENZA VACCINE  06/18/2019  . COLONOSCOPY  07/25/2020 (Originally 05/13/2019)  . MAMMOGRAM  09/02/2020  . TETANUS/TDAP  04/29/2023  . DEXA SCAN  Completed  . Hepatitis C Screening  Completed  . PNA vac Low Risk Adult  Completed      Plan:    Keep all routine maintenance appointments.   Follow up with pcp today.  Medicare Attestation I have personally reviewed: The patient's medical and social history Their use of alcohol, tobacco or illicit drugs Their current medications and supplements The patient's functional ability including ADLs,fall  risks, home safety risks, cognitive, and hearing and visual impairment Diet and physical activities Evidence for depression       OBrien-Blaney, Chloe Marconi L, LPN  [id number]   Reviewed above information.  Agree with assessment and plan.    Dr Nicki Reaper

## 2019-07-26 NOTE — Patient Instructions (Addendum)
  Chloe Moyer , Thank you for taking time to come for your Medicare Wellness Visit. I appreciate your ongoing commitment to your health goals. Please review the following plan we discussed and let me know if I can assist you in the future.   These are the goals we discussed: Goals    . Follow up with Primary Care Provider     As needed       This is a list of the screening recommended for you and due dates:  Health Maintenance  Topic Date Due  . Flu Shot  06/18/2019  . Colon Cancer Screening  07/25/2020*  . Mammogram  09/02/2020  . Tetanus Vaccine  04/29/2023  . DEXA scan (bone density measurement)  Completed  .  Hepatitis C: One time screening is recommended by Center for Disease Control  (CDC) for  adults born from [birth year] through 1967.   Completed  . Pneumonia vaccines  Completed  *Topic was postponed. The date shown is not the original due date.

## 2019-07-26 NOTE — Progress Notes (Signed)
Patient ID: Chloe Moyer, female   DOB: 1951-10-31, 68 y.o.   MRN: [id number]   Subjective:    Patient ID: Chloe Moyer, female    DOB: 01-Jan-1951, 68 y.o.   MRN: [id number]  HPI  Patient here for her physical exam.  She reports she is doing relatively well.  Tries to stay active.  No chest pain.  No sob.  Breathing doing well.  No sinus symptoms.  Had questions about getting her infusions here.  No acid reflux.  No abdominal pain.  Bowels moving.  Has seen ortho for knee pain.  Due f/u colonoscopy.  She will call to schedule.  Blood pressure elevated.     Past Medical History:  Diagnosis Date  . Allergic rhinitis   . Arthritis   . Asthma   . Diverticulosis, sigmoid   . GERD (gastroesophageal reflux disease)   . History of hiatal hernia   . Hypertension 06-12-2005  . Ulcer    Past Surgical History:  Procedure Laterality Date  . BREAST BIOPSY Right 09/01/2017   Affirm Bx of 2 areas- benign  . BREAST CYST ASPIRATION Left 1990  . CARDIAC CATHETERIZATION  July 2012  . CHOLECYSTECTOMY  1991  . LIPOMA EXCISION  4/99   Left flank area  . NASAL SINUS SURGERY  July 2012  . TUBAL LIGATION  1984   Family History  Problem Relation Age of Onset  . Emphysema Father        smoked  . Asthma Father   . Lung cancer Father        smoked  . Hypertension Father   . Hypertension Mother   . Breast cancer Maternal Grandmother 60  . Heart disease Paternal Grandfather        myocardial infarction  . Stroke Maternal Grandfather    Social History   Socioeconomic History  . Marital status: Married    Spouse name: Not on file  . Number of children: 2  . Years of education: Not on file  . Highest education level: Not on file  Occupational History  . Occupation: Works at [employer]  Social Needs  . Financial resource strain: Not hard at all  . Food insecurity    Worry: Never true    Inability: Never true  . Transportation needs    Medical: No    Non-medical: No  Tobacco  Use  . Smoking status: Never Smoker  . Smokeless tobacco: Never Used  Substance and Sexual Activity  . Alcohol use: Yes    Alcohol/week: 0.0 standard drinks    Comment: occ wine with dinner  . Drug use: No  . Sexual activity: Yes  Lifestyle  . Physical activity    Days per week: Not on file    Minutes per session: Not on file  . Stress: Not at all  Relationships  . Social Herbalist on phone: Not on file    Gets together: Not on file    Attends religious service: Not on file    Active member of club or organization: Not on file    Attends meetings of clubs or organizations: Not on file    Relationship status: Not on file  Other Topics Concern  . Not on file  Social History Narrative  . Not on file    Outpatient Encounter Medications as of 07/26/2019  Medication Sig  . albuterol (PROVENTIL HFA;VENTOLIN HFA) 108 (90 BASE) MCG/ACT inhaler Inhale 2 puffs using inhaler every 4-6 hours  as needed for SOB/wheeze.  . budesonide (PULMICORT) 0.5 MG/2ML nebulizer solution Take 0.5 mg by nebulization 2 (two) times daily.  . budesonide-formoterol (SYMBICORT) 160-4.5 MCG/ACT inhaler Inhale 2 puffs into the lungs daily. as directed.  Marland Kitchen EPINEPHrine (EPIPEN 2-PAK) 0.3 mg/0.3 mL IJ SOAJ injection Inject 0.3 mLs (0.3 mg total) into the muscle once.  . hydrocortisone (ANUSOL-HC) 25 MG suppository Place 1 suppository (25 mg total) rectally 2 (two) times daily as needed for hemorrhoids or itching.  Marland Kitchen Hypertonic Nasal Wash (SINUS RINSE NA) As directed three times daily   . losartan (COZAAR) 100 MG tablet Take 1 tablet (100 mg total) by mouth daily.  . meloxicam (MOBIC) 15 MG tablet Take 1 tablet (15 mg total) by mouth daily as needed.  Marland Kitchen omalizumab (XOLAIR) 150 MG injection 150 mg every 30 (thirty) days.   . [DISCONTINUED] losartan (COZAAR) 50 MG tablet TAKE 1 TABLET BY MOUTH ONCE DAILY  . [DISCONTINUED] omeprazole (PRILOSEC) 10 MG capsule TAKE 1 CAPSULE BY MOUTH ONCE DAILY   No  facility-administered encounter medications on file as of 07/26/2019.     Review of Systems  Constitutional: Negative for appetite change and unexpected weight change.  HENT: Negative for congestion and sinus pressure.   Eyes: Negative for pain and visual disturbance.  Respiratory: Negative for cough, chest tightness and shortness of breath.   Cardiovascular: Negative for chest pain, palpitations and leg swelling.  Gastrointestinal: Negative for abdominal pain, diarrhea, nausea and vomiting.  Genitourinary: Negative for difficulty urinating and dysuria.  Musculoskeletal: Negative for joint swelling and myalgias.  Skin: Negative for color change and rash.  Neurological: Negative for dizziness, light-headedness and headaches.  Hematological: Negative for adenopathy. Does not bruise/bleed easily.  Psychiatric/Behavioral: Negative for agitation and dysphoric mood.       Objective:    Physical Exam Constitutional:      General: She is not in acute distress.    Appearance: Normal appearance. She is well-developed.  HENT:     Right Ear: External ear normal.     Left Ear: External ear normal.  Eyes:     General: No scleral icterus.       Right eye: No discharge.        Left eye: No discharge.     Conjunctiva/sclera: Conjunctivae normal.  Neck:     Musculoskeletal: Neck supple. No muscular tenderness.     Thyroid: No thyromegaly.  Cardiovascular:     Rate and Rhythm: Normal rate and regular rhythm.  Pulmonary:     Effort: No tachypnea, accessory muscle usage or respiratory distress.     Breath sounds: Normal breath sounds. No decreased breath sounds or wheezing.  Chest:     Breasts:        Right: No inverted nipple, mass, nipple discharge or tenderness (no axillary adenopathy).        Left: No inverted nipple, mass, nipple discharge or tenderness (no axilarry adenopathy).  Abdominal:     General: Bowel sounds are normal.     Palpations: Abdomen is soft.     Tenderness: There is  no abdominal tenderness.  Musculoskeletal:        General: No swelling or tenderness.  Lymphadenopathy:     Cervical: No cervical adenopathy.  Skin:    Findings: No erythema or rash.  Neurological:     Mental Status: She is alert and oriented to person, place, and time.  Psychiatric:        Mood and Affect: Mood normal.  Behavior: Behavior normal.     BP 132/80   Pulse 83   Temp (!) 95.8 F (35.4 C) (Temporal)   Resp 16   Wt 177 lb 9.6 oz (80.6 kg)   LMP 11/03/1996   SpO2 97%   BMI 28.67 kg/m  Wt Readings from Last 3 Encounters:  07/26/19 177 lb 9.6 oz (80.6 kg)  01/21/19 176 lb 6.4 oz (80 kg)  10/28/18 176 lb 9.6 oz (80.1 kg)     Lab Results  Component Value Date   WBC 6.7 10/27/2018   HGB 13.5 10/27/2018   HCT 40.5 10/27/2018   PLT 240.0 10/27/2018   GLUCOSE 88 02/17/2019   CHOL 169 02/17/2019   TRIG 94.0 02/17/2019   HDL 45.30 02/17/2019   LDLCALC 105 (H) 02/17/2019   ALT 27 02/17/2019   AST 31 02/17/2019   NA 139 02/17/2019   K 4.1 02/17/2019   CL 105 02/17/2019   CREATININE 0.66 02/17/2019   BUN 11 02/17/2019   CO2 27 02/17/2019   TSH 4.04 02/17/2019   INR 0.94 01/07/2017   HGBA1C 5.8 02/17/2019   MICROALBUR 6.5 (H) 01/25/2019    Mr Knee Left Wo Contrast  Result Date: 11/15/2018 CLINICAL DATA:  Chronic knee pain. EXAM: MRI OF THE LEFT KNEE WITHOUT CONTRAST TECHNIQUE: Multiplanar, multisequence MR imaging of the knee was performed. No intravenous contrast was administered. COMPARISON:  None. FINDINGS: MENISCI Medial meniscus:  Radial tear of the posterior horn. Lateral meniscus:  Intact. LIGAMENTS Cruciates:  Intact ACL and PCL.  Mucoid degeneration of the ACL. Collaterals: Medial collateral ligament is intact. Lateral collateral ligament complex is intact. CARTILAGE Patellofemoral: Mild diffuse cartilage thinning with small focal full-thickness defect over the superior trochlear groove. Medial:  Mild partial-thickness cartilage loss. Lateral:   Mild partial-thickness cartilage loss. Joint: Small joint effusion. Normal Hoffa's fat. No plical thickening. Popliteal Fossa:  Small Baker cyst.  Intact popliteus tendon. Extensor Mechanism: Intact quadriceps tendon and patellar tendon. Intact medial and lateral patellar retinaculum. Intact MPFL. Bones: Subchondral insufficiency fracture of the medial tibial plateau with surrounding marrow edema. No dislocation. Small tricompartmental osteophytes. Degenerative marrow edema in the anterior medial femoral condyle. No focal bone lesion. Other: None. IMPRESSION: 1. Radial tear of the medial meniscus posterior horn. 2. Subchondral insufficiency fracture of the medial tibial plateau. 3. Mild tricompartmental osteoarthritis. 4. Small joint effusion and Baker cyst. Electronically Signed   By: Titus Dubin M.D.   On: 11/15/2018 08:19       Assessment & Plan:   Problem List Items Addressed This Visit    Abnormal liver function tests    S/p liver biopsy.  Has been followed by GI.  Follow liver function tests.        Relevant Orders   Hepatic function panel   Asthma    Breathing stable.  Followed by pulmonary.        GERD (gastroesophageal reflux disease)    Controlled on current regimen.  Follow.        Health care maintenance    Physical today 07/26/19.  Colonoscopy 03/2014.  Recommended f/u in 5 years.  She will call to schedule.  Mammogram 09/02/18 with f/u left breast mammogram 09/16/18 - [facility].  Schedule f/u mammogram.         Hyperglycemia    Low carb diet and exercise.  Follow met b and a1c.       Relevant Orders   Hemoglobin A1c   Lipid panel   Basic metabolic panel  Hypertension    Blood pressure on recheck increased.  Remains elevated.  Increase losartan to 18m q day.  Follow pressures.  Follow metabolic panel.        Relevant Medications   losartan (COZAAR) 100 MG tablet   Other Relevant Orders   Lipid panel    Other Visit Diagnoses    Visit for screening mammogram     -  Primary   Relevant Orders   MM 3D SCREEN BREAST BILATERAL   Need for immunization against influenza       Relevant Orders   Flu Vaccine QUAD High Dose(Fluad) (Completed)       CEinar Pheasant MD

## 2019-07-27 ENCOUNTER — Encounter: Payer: Self-pay | Admitting: Internal Medicine

## 2019-07-27 NOTE — Assessment & Plan Note (Signed)
Blood pressure on recheck increased.  Remains elevated.  Increase losartan to 100mg  q day.  Follow pressures.  Follow metabolic panel.

## 2019-07-27 NOTE — Assessment & Plan Note (Signed)
Low carb diet and exercise.  Follow met b and a1c.  

## 2019-07-27 NOTE — Assessment & Plan Note (Signed)
S/p liver biopsy.  Has been followed by GI.  Follow liver function tests.

## 2019-07-27 NOTE — Assessment & Plan Note (Signed)
Breathing stable.  Followed by pulmonary.  

## 2019-07-27 NOTE — Assessment & Plan Note (Signed)
Controlled on current regimen.  Follow.  

## 2019-07-28 DIAGNOSIS — J4550 Severe persistent asthma, uncomplicated: Secondary | ICD-10-CM | POA: Diagnosis not present

## 2019-07-28 DIAGNOSIS — J309 Allergic rhinitis, unspecified: Secondary | ICD-10-CM | POA: Diagnosis not present

## 2019-07-28 DIAGNOSIS — J4540 Moderate persistent asthma, uncomplicated: Secondary | ICD-10-CM | POA: Diagnosis not present

## 2019-08-11 ENCOUNTER — Other Ambulatory Visit: Payer: Self-pay

## 2019-08-11 ENCOUNTER — Other Ambulatory Visit (INDEPENDENT_AMBULATORY_CARE_PROVIDER_SITE_OTHER): Payer: PPO

## 2019-08-11 DIAGNOSIS — R739 Hyperglycemia, unspecified: Secondary | ICD-10-CM | POA: Diagnosis not present

## 2019-08-11 DIAGNOSIS — I10 Essential (primary) hypertension: Secondary | ICD-10-CM

## 2019-08-11 DIAGNOSIS — R945 Abnormal results of liver function studies: Secondary | ICD-10-CM | POA: Diagnosis not present

## 2019-08-11 DIAGNOSIS — R7989 Other specified abnormal findings of blood chemistry: Secondary | ICD-10-CM

## 2019-08-11 LAB — BASIC METABOLIC PANEL
BUN: 15 mg/dL (ref 6–23)
CO2: 27 mEq/L (ref 19–32)
Calcium: 10.1 mg/dL (ref 8.4–10.5)
Chloride: 105 mEq/L (ref 96–112)
Creatinine, Ser: 0.74 mg/dL (ref 0.40–1.20)
GFR: 77.63 mL/min (ref >60.00)
Glucose, Bld: 98 mg/dL (ref 70–99)
Potassium: 3.8 mEq/L (ref 3.5–5.1)
Sodium: 138 mEq/L (ref 135–145)

## 2019-08-11 LAB — LIPID PANEL
Cholesterol: 151 mg/dL (ref 0–200)
HDL: 44.9 mg/dL (ref >39.00)
LDL Cholesterol: 92 mg/dL (ref 0–99)
NonHDL: 105.92
Total CHOL/HDL Ratio: 3
Triglycerides: 71 mg/dL (ref 0.0–149.0)
VLDL: 14.2 mg/dL (ref 0.0–40.0)

## 2019-08-11 LAB — HEPATIC FUNCTION PANEL
ALT: 27 U/L (ref 0–35)
AST: 31 U/L (ref 0–37)
Albumin: 4.3 g/dL (ref 3.5–5.2)
Alkaline Phosphatase: 61 U/L (ref 39–117)
Bilirubin, Direct: 0.1 mg/dL (ref 0.0–0.3)
Total Bilirubin: 0.6 mg/dL (ref 0.2–1.2)
Total Protein: 7.4 g/dL (ref 6.0–8.3)

## 2019-08-11 LAB — HEMOGLOBIN A1C: Hgb A1c MFr Bld: 5.7 % (ref 4.6–6.5)

## 2019-08-23 DIAGNOSIS — J4541 Moderate persistent asthma with (acute) exacerbation: Secondary | ICD-10-CM | POA: Diagnosis not present

## 2019-09-06 ENCOUNTER — Ambulatory Visit
Admission: RE | Admit: 2019-09-06 | Discharge: 2019-09-06 | Disposition: A | Discharge status: Home / Self Care | Payer: PPO | Source: Ambulatory Visit | Attending: Internal Medicine | Admitting: Internal Medicine

## 2019-09-06 DIAGNOSIS — Z1231 Encounter for screening mammogram for malignant neoplasm of breast: Secondary | ICD-10-CM | POA: Diagnosis not present

## 2019-09-20 DIAGNOSIS — J4541 Moderate persistent asthma with (acute) exacerbation: Secondary | ICD-10-CM | POA: Diagnosis not present

## 2019-09-21 ENCOUNTER — Encounter: Payer: Self-pay | Admitting: Internal Medicine

## 2019-09-22 ENCOUNTER — Encounter: Payer: Self-pay | Admitting: Internal Medicine

## 2019-09-22 ENCOUNTER — Other Ambulatory Visit: Payer: Self-pay

## 2019-09-22 ENCOUNTER — Ambulatory Visit (INDEPENDENT_AMBULATORY_CARE_PROVIDER_SITE_OTHER): Payer: PPO | Admitting: Internal Medicine

## 2019-09-22 DIAGNOSIS — I10 Essential (primary) hypertension: Secondary | ICD-10-CM

## 2019-09-22 DIAGNOSIS — K219 Gastro-esophageal reflux disease without esophagitis: Secondary | ICD-10-CM

## 2019-09-22 DIAGNOSIS — J4520 Mild intermittent asthma, uncomplicated: Secondary | ICD-10-CM

## 2019-09-22 DIAGNOSIS — R945 Abnormal results of liver function studies: Secondary | ICD-10-CM

## 2019-09-22 DIAGNOSIS — R739 Hyperglycemia, unspecified: Secondary | ICD-10-CM | POA: Diagnosis not present

## 2019-09-22 DIAGNOSIS — F439 Reaction to severe stress, unspecified: Secondary | ICD-10-CM | POA: Diagnosis not present

## 2019-09-22 DIAGNOSIS — R7989 Other specified abnormal findings of blood chemistry: Secondary | ICD-10-CM

## 2019-09-22 MED ORDER — ALPRAZOLAM 0.25 MG PO TABS: 0.2500 mg | ORAL_TABLET | Freq: Every day | ORAL | 0 refills | Status: DC | PRN

## 2019-09-22 NOTE — Progress Notes (Signed)
Patient ID: Chloe Moyer, female   DOB: May 08, 1951, 68 y.o.   MRN: [id number]   Subjective:    Patient ID: Chloe Moyer, female    DOB: 1951/08/31, 68 y.o.   MRN: [id number]  HPI  Patient here for a scheduled follow up.  She reports increased stress.  Have had multiple deaths in her family recently.  Discussed with her today.  She feels she is handling things relatively well.  Does not feel needs anything more at this time.  Was evaluated at [facility] for her injection.  Blood pressure was noted to be elevated at that visit.  Has since come down.  Was doing well prior to that visit.  No chest pain.  No sob.  No acid reflux.  No abdominal pain.  Bowels moving.     Past Medical History:  Diagnosis Date  . Allergic rhinitis   . Arthritis   . Asthma   . Diverticulosis, sigmoid   . GERD (gastroesophageal reflux disease)   . History of hiatal hernia   . Hypertension 06-12-2005  . Ulcer    Past Surgical History:  Procedure Laterality Date  . BREAST BIOPSY Right 09/01/2017   Affirm Bx of 2 areas- both areas were fibroadenomatoid change   . BREAST CYST ASPIRATION Left 1990  . CARDIAC CATHETERIZATION  July 2012  . CHOLECYSTECTOMY  1991  . LIPOMA EXCISION  4/99   Left flank area  . NASAL SINUS SURGERY  July 2012  . TUBAL LIGATION  1984   Family History  Problem Relation Age of Onset  . Emphysema Father        smoked  . Asthma Father   . Lung cancer Father        smoked  . Hypertension Father   . Hypertension Mother   . Cancer Mother        oral  . Breast cancer Maternal Grandmother 6  . Heart disease Paternal Grandfather        myocardial infarction  . Stroke Maternal Grandfather    Social History   Socioeconomic History  . Marital status: Married    Spouse name: Not on file  . Number of children: 2  . Years of education: Not on file  . Highest education level: Not on file  Occupational History  . Occupation: Works at [employer]  Social Needs  . Financial  resource strain: Not hard at all  . Food insecurity    Worry: Never true    Inability: Never true  . Transportation needs    Medical: No    Non-medical: No  Tobacco Use  . Smoking status: Never Smoker  . Smokeless tobacco: Never Used  Substance and Sexual Activity  . Alcohol use: Yes    Alcohol/week: 0.0 standard drinks    Comment: occ wine with dinner  . Drug use: No  . Sexual activity: Yes  Lifestyle  . Physical activity    Days per week: Not on file    Minutes per session: Not on file  . Stress: Not at all  Relationships  . Social Herbalist on phone: Not on file    Gets together: Not on file    Attends religious service: Not on file    Active member of club or organization: Not on file    Attends meetings of clubs or organizations: Not on file    Relationship status: Not on file  Other Topics Concern  . Not on file  Social  History Narrative  . Not on file    Outpatient Encounter Medications as of 09/22/2019  Medication Sig  . albuterol (PROVENTIL HFA;VENTOLIN HFA) 108 (90 BASE) MCG/ACT inhaler Inhale 2 puffs using inhaler every 4-6 hours as needed for SOB/wheeze.  Marland Kitchen ALPRAZolam (XANAX) 0.25 MG tablet Take 1 tablet (0.25 mg total) by mouth daily as needed for anxiety.  . budesonide (PULMICORT) 0.5 MG/2ML nebulizer solution Take 0.5 mg by nebulization 2 (two) times daily.  . budesonide-formoterol (SYMBICORT) 160-4.5 MCG/ACT inhaler Inhale 2 puffs into the lungs daily. as directed.  Marland Kitchen EPINEPHrine (EPIPEN 2-PAK) 0.3 mg/0.3 mL IJ SOAJ injection Inject 0.3 mLs (0.3 mg total) into the muscle once.  . hydrocortisone (ANUSOL-HC) 25 MG suppository Place 1 suppository (25 mg total) rectally 2 (two) times daily as needed for hemorrhoids or itching.  Marland Kitchen Hypertonic Nasal Wash (SINUS RINSE NA) As directed three times daily   . losartan (COZAAR) 100 MG tablet Take 1 tablet (100 mg total) by mouth daily.  . meloxicam (MOBIC) 15 MG tablet Take 1 tablet (15 mg total) by mouth  daily as needed.  Marland Kitchen omalizumab (XOLAIR) 150 MG injection 150 mg every 30 (thirty) days.   Marland Kitchen omeprazole (PRILOSEC) 10 MG capsule TAKE 1 CAPSULE BY MOUTH ONCE DAILY   No facility-administered encounter medications on file as of 09/22/2019.    Review of Systems  Constitutional: Negative for appetite change and unexpected weight change.  HENT: Negative for congestion and sinus pressure.   Respiratory: Negative for cough, chest tightness and shortness of breath.   Cardiovascular: Negative for chest pain, palpitations and leg swelling.  Gastrointestinal: Negative for abdominal pain, diarrhea, nausea and vomiting.  Genitourinary: Negative for difficulty urinating and dysuria.  Musculoskeletal: Negative for joint swelling and myalgias.  Skin: Negative for color change and rash.  Neurological: Negative for dizziness, light-headedness and headaches.  Psychiatric/Behavioral: Negative for agitation and dysphoric mood.       Objective:    Physical Exam Constitutional:      General: She is not in acute distress.    Appearance: Normal appearance.  HENT:     Head: Normocephalic and atraumatic.     Right Ear: External ear normal.     Left Ear: External ear normal.  Eyes:     General: No scleral icterus.       Right eye: No discharge.        Left eye: No discharge.     Conjunctiva/sclera: Conjunctivae normal.  Neck:     Musculoskeletal: Neck supple. No muscular tenderness.     Thyroid: No thyromegaly.  Cardiovascular:     Rate and Rhythm: Normal rate and regular rhythm.  Pulmonary:     Effort: No respiratory distress.     Breath sounds: Normal breath sounds. No wheezing.  Abdominal:     General: Bowel sounds are normal.     Palpations: Abdomen is soft.     Tenderness: There is no abdominal tenderness.  Musculoskeletal:        General: No swelling or tenderness.  Lymphadenopathy:     Cervical: No cervical adenopathy.  Skin:    Findings: No erythema or rash.  Neurological:     Mental  Status: She is alert.  Psychiatric:        Mood and Affect: Mood normal.        Behavior: Behavior normal.     BP 122/72   Pulse 87   Temp (!) 96.8 F (36 C)   Resp 16   Wt  172 lb (78 kg)   LMP 11/03/1996   SpO2 99%   BMI 27.76 kg/m  Wt Readings from Last 3 Encounters:  09/22/19 172 lb (78 kg)  07/26/19 177 lb 9.6 oz (80.6 kg)  01/21/19 176 lb 6.4 oz (80 kg)     Lab Results  Component Value Date   WBC 6.7 10/27/2018   HGB 13.5 10/27/2018   HCT 40.5 10/27/2018   PLT 240.0 10/27/2018   GLUCOSE 98 08/11/2019   CHOL 151 08/11/2019   TRIG 71.0 08/11/2019   HDL 44.90 08/11/2019   LDLCALC 92 08/11/2019   ALT 27 08/11/2019   AST 31 08/11/2019   NA 138 08/11/2019   K 3.8 08/11/2019   CL 105 08/11/2019   CREATININE 0.74 08/11/2019   BUN 15 08/11/2019   CO2 27 08/11/2019   TSH 4.04 02/17/2019   INR 0.94 01/07/2017   HGBA1C 5.7 08/11/2019   MICROALBUR 6.5 (H) 01/25/2019    Mm 3d Screen Breast Bilateral  Result Date: 09/06/2019 CLINICAL DATA:  Screening. EXAM: DIGITAL SCREENING BILATERAL MAMMOGRAM WITH TOMO AND CAD COMPARISON:  Previous exam(s). ACR Breast Density Category b: There are scattered areas of fibroglandular density. FINDINGS: There are no findings suspicious for malignancy. Images were processed with CAD. IMPRESSION: No mammographic evidence of malignancy. A result letter of this screening mammogram will be mailed directly to the patient. RECOMMENDATION: Screening mammogram in one year. (Code:SM-B-01Y) BI-RADS CATEGORY  1: Negative. Electronically Signed   By: Curlene Dolphin M.D.   On: 09/06/2019 15:02       Assessment & Plan:   Problem List Items Addressed This Visit    Abnormal liver function tests    S/p liver biopsy.  Has been followed by GI.  Follow liver function tests.        Relevant Orders   Hepatic function panel   Asthma    Breathing stable.        GERD (gastroesophageal reflux disease)    Controlled.       Hyperglycemia    Low carb  diet and exercise.  Follow met b and a1c.       Relevant Orders   Hemoglobin A1c   DM Microalbumin / creatinine urine ratio   Hypertension    Blood pressure on checks here in the office today - wnl.  Continue same medication.  Follow pressures.  Follow metabolic panel.       Relevant Orders   CBC with Differential/Platelet   Lipid panel   TSH   Basic metabolic panel (future)   DM Microalbumin / creatinine urine ratio   Stress    Increased stress as outlined.  Discussed with her today.  She feels she is handling things well. Does not feel needs any further intervention.  Follow.           Einar Pheasant, MD

## 2019-09-22 NOTE — Telephone Encounter (Signed)
Printed for appt.

## 2019-09-25 ENCOUNTER — Encounter: Payer: Self-pay | Admitting: Internal Medicine

## 2019-09-25 DIAGNOSIS — F439 Reaction to severe stress, unspecified: Secondary | ICD-10-CM | POA: Insufficient documentation

## 2019-09-25 NOTE — Assessment & Plan Note (Signed)
Low carb diet and exercise.  Follow met b and a1c.  

## 2019-09-25 NOTE — Assessment & Plan Note (Signed)
Blood pressure on checks here in the office today - wnl.  Continue same medication.  Follow pressures.  Follow metabolic panel.

## 2019-09-25 NOTE — Assessment & Plan Note (Signed)
Increased stress as outlined.  Discussed with her today.  She feels she is handling things well. Does not feel needs any further intervention.  Follow.

## 2019-09-25 NOTE — Assessment & Plan Note (Signed)
Breathing stable.

## 2019-09-25 NOTE — Assessment & Plan Note (Signed)
Controlled.  

## 2019-09-25 NOTE — Assessment & Plan Note (Signed)
S/p liver biopsy.  Has been followed by GI.  Follow liver function tests.

## 2019-10-09 ENCOUNTER — Other Ambulatory Visit: Payer: Self-pay | Admitting: Internal Medicine

## 2019-10-17 ENCOUNTER — Telehealth: Payer: Self-pay

## 2019-10-17 NOTE — Telephone Encounter (Signed)
Called and spoke to pt.  Gave advisement to quarantine from 2 weeks of contact per Dr. Nicki Reaper.  Pt declined offer to scheduled a virtual visit since neither she or her husband are having any symptoms.  Advised pt to call back to schedule an appt and to be tested if either she or her husband develop any symptoms.

## 2019-10-17 NOTE — Telephone Encounter (Signed)
Called and spoke to pt.  Pt said that neither her or her husband are having symptoms right now.  Pt said that she and her husband are feeling fine.  Reminded pt that both her and her husband should be quarantining.  Pt wants to know if her and her husband should get a COVID test now or wait until they begin to have symptoms.

## 2019-10-17 NOTE — Telephone Encounter (Signed)
Copied from [place] [id number]. Topic: General - Other >> Oct 17, 2019  8:46 AM Leward Quan A wrote: Reason for CRM: Patient called to say that her daughter tested positive for covid and she is needing some advise from Dr Nicki Reaper on what to do for both her and her husband. States that they have been with the daughter since last Wednesday. Patient would like a call back at Ph#  [phone]

## 2019-10-17 NOTE — Telephone Encounter (Signed)
If close contact, recommend quarantine for 2 weeks from contact.  Can schedule appt tomorrow to discuss and go over specifics if pt desires.

## 2019-10-24 ENCOUNTER — Telehealth: Payer: Self-pay

## 2019-10-24 NOTE — Telephone Encounter (Signed)
Copied from [place] [id number]. Topic: General - Other >> Oct 24, 2019  3:17 PM Carolyn Stare wrote: Pt husband tested pos and she will get tested this Friday and said the health dept told her to ask about antibody infusion

## 2019-10-25 NOTE — Telephone Encounter (Signed)
Spoke with pt. Stated that she feels like she has a sinus infection. Doing ok. Going to be tested on Friday for [place] since husband is positive. Advised that we are not referring patients to have the infusion yet. Patient is going to let me know if she needs anything.

## 2019-10-28 DIAGNOSIS — Z20828 Contact with and (suspected) exposure to other viral communicable diseases: Secondary | ICD-10-CM | POA: Diagnosis not present

## 2019-10-31 ENCOUNTER — Encounter: Payer: Self-pay | Admitting: Internal Medicine

## 2019-10-31 ENCOUNTER — Telehealth: Payer: Self-pay | Admitting: Internal Medicine

## 2019-10-31 ENCOUNTER — Other Ambulatory Visit: Payer: Self-pay

## 2019-10-31 ENCOUNTER — Ambulatory Visit (INDEPENDENT_AMBULATORY_CARE_PROVIDER_SITE_OTHER): Payer: PPO | Admitting: Internal Medicine

## 2019-10-31 DIAGNOSIS — J4520 Mild intermittent asthma, uncomplicated: Secondary | ICD-10-CM

## 2019-10-31 DIAGNOSIS — U071 COVID-19: Secondary | ICD-10-CM

## 2019-10-31 NOTE — Progress Notes (Signed)
Patient ID: Chloe Moyer, female   DOB: [birth year], 68 y.o.   MRN: [id number]   Virtual Visit via telephone note  This visit type was conducted due to national recommendations for restrictions regarding the COVID-19 pandemic (e.g. social distancing).  This format is felt to be most appropriate for this patient at this time.  All issues noted in this document were discussed and addressed.  No physical exam was performed (except for noted visual exam findings with Video Visits).   I connected with Chloe Moyer by telephone and verified that I am speaking with the correct person using two identifiers. Location patient: home Location provider: work Persons participating in the telephone visit: patient, provider  I discussed the limitations, risks, security and privacy concerns of performing an evaluation and management service by telephone and the availability of in person appointments.  The patient expressed understanding and agreed to proceed.   Reason for visit: work in appt  HPI: Reports with her daughter at Thanksgiving.  Daughter subsequently tested positive for covid.  Her husband subsequently was diagnosed with covid.  She started having symptoms 10/21/19.  Started with aching and some chills.  No documented fever.  Noticed when she would bend over - increased pressure in her head.  Started using neomed saline nasal rinse and budesonide drops. This has helped.  Head pressure has improved.  Was diagnosed with covid 10/28/19.  She is eating and drinking.  No nausea or vomiting.  No diarrhea.  Discussed self quarantine.      ROS: See pertinent positives and negatives per HPI.  Past Medical History:  Diagnosis Date  . Allergic rhinitis   . Arthritis   . Asthma   . Diverticulosis, sigmoid   . GERD (gastroesophageal reflux disease)   . History of hiatal hernia   . Hypertension 06-12-2005  . Ulcer     Past Surgical History:  Procedure Laterality Date  . BREAST BIOPSY Right  09/01/2017   Affirm Bx of 2 areas- both areas were fibroadenomatoid change   . BREAST CYST ASPIRATION Left 1990  . CARDIAC CATHETERIZATION  July 2012  . CHOLECYSTECTOMY  1991  . LIPOMA EXCISION  4/99   Left flank area  . NASAL SINUS SURGERY  July 2012  . TUBAL LIGATION  1984    Family History  Problem Relation Age of Onset  . Emphysema Father        smoked  . Asthma Father   . Lung cancer Father        smoked  . Hypertension Father   . Hypertension Mother   . Cancer Mother        oral  . Breast cancer Maternal Grandmother 20  . Heart disease Paternal Grandfather        myocardial infarction  . Stroke Maternal Grandfather     SOCIAL HX: reviewed.    Current Outpatient Medications:  .  albuterol (PROVENTIL HFA;VENTOLIN HFA) 108 (90 BASE) MCG/ACT inhaler, Inhale 2 puffs using inhaler every 4-6 hours as needed for SOB/wheeze., Disp: , Rfl:  .  ALPRAZolam (XANAX) 0.25 MG tablet, Take 1 tablet (0.25 mg total) by mouth daily as needed for anxiety., Disp: 20 tablet, Rfl: 0 .  budesonide (PULMICORT) 0.5 MG/2ML nebulizer solution, Take 0.5 mg by nebulization 2 (two) times daily., Disp: , Rfl:  .  budesonide-formoterol (SYMBICORT) 160-4.5 MCG/ACT inhaler, Inhale 2 puffs into the lungs daily. as directed., Disp: 1 Inhaler, Rfl: 2 .  EPINEPHrine (EPIPEN 2-PAK) 0.3 mg/0.3 mL IJ SOAJ injection,  Inject 0.3 mLs (0.3 mg total) into the muscle once., Disp: 1 Device, Rfl: 1 .  hydrocortisone (ANUSOL-HC) 25 MG suppository, Place 1 suppository (25 mg total) rectally 2 (two) times daily as needed for hemorrhoids or itching., Disp: 14 suppository, Rfl: 0 .  Hypertonic Nasal Wash (SINUS RINSE NA), As directed three times daily , Disp: , Rfl:  .  losartan (COZAAR) 100 MG tablet, TAKE 1 TABLET BY MOUTH ONCE DAILY, Disp: 30 tablet, Rfl: 2 .  meloxicam (MOBIC) 15 MG tablet, Take 1 tablet (15 mg total) by mouth daily as needed., Disp: 30 tablet, Rfl: 0 .  omalizumab (XOLAIR) 150 MG injection, 150 mg every  30 (thirty) days. , Disp: , Rfl:  .  omeprazole (PRILOSEC) 10 MG capsule, TAKE 1 CAPSULE BY MOUTH ONCE DAILY, Disp: 30 capsule, Rfl: 11  EXAM:  VITALS per patient if applicable:  0000000, pulse 78  GENERAL: alert.  Sounds to be in no acute distress.  Answering questions appropriately.    PSYCH/NEURO: pleasant and cooperative, no obvious depression or anxiety, speech and thought processing grossly intact  ASSESSMENT AND PLAN:  Discussed the following assessment and plan:  COVID-19 virus infection Symptoms as outlined.  She has underlying asthma and is continuing on her current regimen.  Breathing overall stable.  No chest tightness or wheezing.  Head symptoms and nasal congestion better since starting on her saline nasal rinses and budesonide drops.  Continue symptomatic treatment.  Discussed self quarantine.  Follow.    Asthma Has a documented history of asthma.  Breathing stable on current medication regimen.  Follow.      I discussed the assessment and treatment plan with the patient. The patient was provided an opportunity to ask questions and all were answered. The patient agreed with the plan and demonstrated an understanding of the instructions.   The patient was advised to call back or seek an in-person evaluation if the symptoms worsen or if the condition fails to improve as anticipated.  I provided 21 minutes of non-face-to-face time during this encounter.   Einar Pheasant, MD

## 2019-10-31 NOTE — Assessment & Plan Note (Signed)
Symptoms as outlined.  She has underlying asthma and is continuing on her current regimen.  Breathing overall stable.  No chest tightness or wheezing.  Head symptoms and nasal congestion better since starting on her saline nasal rinses and budesonide drops.  Continue symptomatic treatment.  Discussed self quarantine.  Follow.

## 2019-10-31 NOTE — Telephone Encounter (Signed)
Ok to schedule at 4:30.

## 2019-10-31 NOTE — Telephone Encounter (Signed)
Patients husband scheduled for today at 4:30. Wife was wantinfg to know if we could do their appointment together since they are both COVID positive and will only have to go out once if meds are called in.

## 2019-10-31 NOTE — Assessment & Plan Note (Signed)
Has a documented history of asthma.  Breathing stable on current medication regimen.  Follow.

## 2019-10-31 NOTE — Telephone Encounter (Signed)
Pt scheduled for today.  

## 2019-10-31 NOTE — Telephone Encounter (Signed)
Pt called stating Tested positive covid on friday/ possible sinus infec/head thrombing/coughing/no fever/achy. No appt avail. Health dept stated pt needs antibiotic. Please advise and Thank you!  Call pt @ [phone].  Pharmacy is [facility], [place].

## 2019-11-02 ENCOUNTER — Ambulatory Visit: Payer: PPO | Admitting: Internal Medicine

## 2019-11-02 ENCOUNTER — Telehealth: Payer: Self-pay | Admitting: Nurse Practitioner

## 2019-11-02 NOTE — Telephone Encounter (Signed)
Called to Discuss with patient about Covid symptoms and the use of bamlanivimab, a monoclonal antibody infusion for those with mild to moderate Covid symptoms and at a high risk of hospitalization.     Pt is qualified for this infusion at the [facility] infusion center due to co-morbid conditions and/or a member of an at-risk group.     Patient Active Problem List   Diagnosis Date Noted  . COVID-19 virus infection 10/31/2019  . Stress 09/25/2019  . Joint stiffness of hand 06/26/2018  . Plantar fasciitis 06/26/2018  . Left leg pain 02/21/2018  . Hyperglycemia 02/21/2018  . Visual disturbance 02/20/2016  . Near syncope 02/20/2016  . Right lower quadrant pain 08/04/2015  . Abnormal liver function tests 08/04/2015  . UTI symptoms 03/31/2015  . Health care maintenance 02/03/2015  . Cough 12/21/2014  . History of colonic polyps 05/29/2014  . Leukopenia 07/07/2013  . Asthma 10/10/2012  . Allergic rhinitis 10/10/2012  . GERD (gastroesophageal reflux disease) 10/10/2012  . Hypertension 10/10/2012    Patient states that her symptoms started more than 10 days ago and she is feeling better now.

## 2019-11-07 DIAGNOSIS — J4541 Moderate persistent asthma with (acute) exacerbation: Secondary | ICD-10-CM | POA: Diagnosis not present

## 2019-11-16 ENCOUNTER — Other Ambulatory Visit: Payer: Self-pay | Admitting: Internal Medicine

## 2019-11-22 DIAGNOSIS — L570 Actinic keratosis: Secondary | ICD-10-CM | POA: Diagnosis not present

## 2019-11-22 DIAGNOSIS — L281 Prurigo nodularis: Secondary | ICD-10-CM | POA: Diagnosis not present

## 2019-11-22 NOTE — Telephone Encounter (Signed)
Confirmed not having any acute issues. Has problems with hemorrhoids and would like to have to keep on hand.

## 2019-11-23 MED ORDER — HYDROCORTISONE ACETATE 25 MG RE SUPP: 25.0000 mg | Freq: Two times a day (BID) | RECTAL | 0 refills | Status: DC | PRN

## 2019-12-06 DIAGNOSIS — J4541 Moderate persistent asthma with (acute) exacerbation: Secondary | ICD-10-CM | POA: Diagnosis not present

## 2019-12-06 DIAGNOSIS — J45909 Unspecified asthma, uncomplicated: Secondary | ICD-10-CM | POA: Diagnosis not present

## 2019-12-16 DIAGNOSIS — G8929 Other chronic pain: Secondary | ICD-10-CM | POA: Diagnosis not present

## 2019-12-16 DIAGNOSIS — M25462 Effusion, left knee: Secondary | ICD-10-CM | POA: Diagnosis not present

## 2019-12-16 DIAGNOSIS — M1712 Unilateral primary osteoarthritis, left knee: Secondary | ICD-10-CM | POA: Diagnosis not present

## 2019-12-16 DIAGNOSIS — M7122 Synovial cyst of popliteal space [Baker], left knee: Secondary | ICD-10-CM | POA: Diagnosis not present

## 2019-12-16 DIAGNOSIS — M23222 Derangement of posterior horn of medial meniscus due to old tear or injury, left knee: Secondary | ICD-10-CM | POA: Diagnosis not present

## 2019-12-16 DIAGNOSIS — M25562 Pain in left knee: Secondary | ICD-10-CM | POA: Diagnosis not present

## 2019-12-23 DIAGNOSIS — M1712 Unilateral primary osteoarthritis, left knee: Secondary | ICD-10-CM | POA: Diagnosis not present

## 2019-12-23 DIAGNOSIS — M7122 Synovial cyst of popliteal space [Baker], left knee: Secondary | ICD-10-CM | POA: Diagnosis not present

## 2019-12-23 DIAGNOSIS — G8929 Other chronic pain: Secondary | ICD-10-CM | POA: Diagnosis not present

## 2019-12-23 DIAGNOSIS — M23222 Derangement of posterior horn of medial meniscus due to old tear or injury, left knee: Secondary | ICD-10-CM | POA: Diagnosis not present

## 2019-12-23 DIAGNOSIS — M25562 Pain in left knee: Secondary | ICD-10-CM | POA: Diagnosis not present

## 2019-12-23 DIAGNOSIS — M25462 Effusion, left knee: Secondary | ICD-10-CM | POA: Diagnosis not present

## 2019-12-26 ENCOUNTER — Ambulatory Visit: Payer: PPO | Attending: Internal Medicine

## 2019-12-26 DIAGNOSIS — Z23 Encounter for immunization: Secondary | ICD-10-CM | POA: Insufficient documentation

## 2019-12-26 NOTE — Progress Notes (Signed)
   Covid-19 Vaccination Clinic  Name:  ABIA HOCKENBURY    MRN: [id number] DOB: 11-16-1951  12/26/2019  Ms. Trejo was observed post Covid-19 immunization for 15 minutes without incidence. She was provided with Vaccine Information Sheet and instruction to access the V-Safe system.   Ms. Zozaya was instructed to call 911 with any severe reactions post vaccine: Marland Kitchen Difficulty breathing  . Swelling of your face and throat  . A fast heartbeat  . A bad rash all over your body  . Dizziness and weakness    Immunizations Administered    Name Date Dose VIS Date Route   Pfizer COVID-19 Vaccine 12/26/2019  2:17 PM 0.3 mL 10/28/2019 Intramuscular   Manufacturer: [place]   Lot: [id number]   [place]: [id number]

## 2019-12-30 DIAGNOSIS — G8929 Other chronic pain: Secondary | ICD-10-CM | POA: Diagnosis not present

## 2019-12-30 DIAGNOSIS — M25562 Pain in left knee: Secondary | ICD-10-CM | POA: Diagnosis not present

## 2019-12-30 DIAGNOSIS — M25462 Effusion, left knee: Secondary | ICD-10-CM | POA: Diagnosis not present

## 2019-12-30 DIAGNOSIS — M23222 Derangement of posterior horn of medial meniscus due to old tear or injury, left knee: Secondary | ICD-10-CM | POA: Diagnosis not present

## 2019-12-30 DIAGNOSIS — M7122 Synovial cyst of popliteal space [Baker], left knee: Secondary | ICD-10-CM | POA: Diagnosis not present

## 2019-12-30 DIAGNOSIS — M1712 Unilateral primary osteoarthritis, left knee: Secondary | ICD-10-CM | POA: Diagnosis not present

## 2020-01-10 DIAGNOSIS — J4541 Moderate persistent asthma with (acute) exacerbation: Secondary | ICD-10-CM | POA: Diagnosis not present

## 2020-01-20 ENCOUNTER — Ambulatory Visit: Payer: PPO | Attending: Internal Medicine

## 2020-01-20 DIAGNOSIS — Z23 Encounter for immunization: Secondary | ICD-10-CM | POA: Insufficient documentation

## 2020-01-20 NOTE — Progress Notes (Signed)
   Covid-19 Vaccination Clinic  Name:  Chloe Moyer    MRN: [id number] DOB: 1950-12-09  01/20/2020  [name] was observed post Covid-19 immunization for 15 minutes without incident. She was provided with Vaccine Information Sheet and instruction to access the V-Safe system.   [name] was instructed to call 911 with any severe reactions post vaccine: Marland Kitchen Difficulty breathing  . Swelling of face and throat  . A fast heartbeat  . A bad rash all over body  . Dizziness and weakness   Immunizations Administered    Name Date Dose VIS Date Route   Pfizer COVID-19 Vaccine 01/20/2020 11:06 AM 0.3 mL 10/28/2019 Intramuscular   Manufacturer: [place]   Lot: [id number]   [place]: [id number]

## 2020-01-24 DIAGNOSIS — J4540 Moderate persistent asthma, uncomplicated: Secondary | ICD-10-CM | POA: Diagnosis not present

## 2020-01-26 ENCOUNTER — Other Ambulatory Visit: Payer: Self-pay | Admitting: Internal Medicine

## 2020-02-07 DIAGNOSIS — J4541 Moderate persistent asthma with (acute) exacerbation: Secondary | ICD-10-CM | POA: Diagnosis not present

## 2020-02-28 ENCOUNTER — Encounter: Payer: PPO | Admitting: Physical Therapy

## 2020-02-28 DIAGNOSIS — M25562 Pain in left knee: Secondary | ICD-10-CM | POA: Diagnosis not present

## 2020-02-28 DIAGNOSIS — M25462 Effusion, left knee: Secondary | ICD-10-CM | POA: Diagnosis not present

## 2020-02-28 DIAGNOSIS — M7122 Synovial cyst of popliteal space [Baker], left knee: Secondary | ICD-10-CM | POA: Diagnosis not present

## 2020-02-28 DIAGNOSIS — M7632 Iliotibial band syndrome, left leg: Secondary | ICD-10-CM | POA: Diagnosis not present

## 2020-02-28 DIAGNOSIS — G8929 Other chronic pain: Secondary | ICD-10-CM | POA: Diagnosis not present

## 2020-02-28 DIAGNOSIS — M1712 Unilateral primary osteoarthritis, left knee: Secondary | ICD-10-CM | POA: Diagnosis not present

## 2020-02-28 DIAGNOSIS — M23222 Derangement of posterior horn of medial meniscus due to old tear or injury, left knee: Secondary | ICD-10-CM | POA: Diagnosis not present

## 2020-03-06 DIAGNOSIS — J4541 Moderate persistent asthma with (acute) exacerbation: Secondary | ICD-10-CM | POA: Diagnosis not present

## 2020-03-27 ENCOUNTER — Other Ambulatory Visit (INDEPENDENT_AMBULATORY_CARE_PROVIDER_SITE_OTHER): Payer: PPO

## 2020-03-27 ENCOUNTER — Other Ambulatory Visit: Payer: Self-pay

## 2020-03-27 DIAGNOSIS — I10 Essential (primary) hypertension: Secondary | ICD-10-CM

## 2020-03-27 DIAGNOSIS — R7989 Other specified abnormal findings of blood chemistry: Secondary | ICD-10-CM

## 2020-03-27 DIAGNOSIS — R739 Hyperglycemia, unspecified: Secondary | ICD-10-CM | POA: Diagnosis not present

## 2020-03-27 DIAGNOSIS — R945 Abnormal results of liver function studies: Secondary | ICD-10-CM

## 2020-03-27 LAB — CBC WITH DIFFERENTIAL/PLATELET
Basophils Absolute: 0 10*3/uL (ref 0.0–0.1)
Basophils Relative: 1 % (ref 0.0–3.0)
Eosinophils Absolute: 0.2 10*3/uL (ref 0.0–0.7)
Eosinophils Relative: 5.6 % — ABNORMAL HIGH (ref 0.0–5.0)
HCT: 36.8 % (ref 36.0–46.0)
Hemoglobin: 12.6 g/dL (ref 12.0–15.0)
Lymphocytes Relative: 40.5 % (ref 12.0–46.0)
Lymphs Abs: 1.4 10*3/uL (ref 0.7–4.0)
MCHC: 34.2 g/dL (ref 30.0–36.0)
MCV: 88.8 fl (ref 78.0–100.0)
Monocytes Absolute: 0.3 10*3/uL (ref 0.1–1.0)
Monocytes Relative: 9.1 % (ref 3.0–12.0)
Neutro Abs: 1.5 10*3/uL (ref 1.4–7.7)
Neutrophils Relative %: 43.8 % (ref 43.0–77.0)
Platelets: 165 10*3/uL (ref 150.0–400.0)
RBC: 4.14 Mil/uL (ref 3.87–5.11)
RDW: 13.8 % (ref 11.5–15.5)
WBC: 3.5 10*3/uL — ABNORMAL LOW (ref 4.0–10.5)

## 2020-03-27 LAB — HEMOGLOBIN A1C: Hgb A1c MFr Bld: 5.7 % (ref 4.6–6.5)

## 2020-03-27 LAB — BASIC METABOLIC PANEL
BUN: 12 mg/dL (ref 6–23)
CO2: 27 mEq/L (ref 19–32)
Calcium: 9.6 mg/dL (ref 8.4–10.5)
Chloride: 104 mEq/L (ref 96–112)
Creatinine, Ser: 0.76 mg/dL (ref 0.40–1.20)
GFR: 75.14 mL/min (ref >60.00)
Glucose, Bld: 100 mg/dL — ABNORMAL HIGH (ref 70–99)
Potassium: 3.7 mEq/L (ref 3.5–5.1)
Sodium: 137 mEq/L (ref 135–145)

## 2020-03-27 LAB — TSH: TSH: 3.72 u[IU]/mL (ref 0.35–4.50)

## 2020-03-27 LAB — HEPATIC FUNCTION PANEL
ALT: 24 U/L (ref 0–35)
AST: 26 U/L (ref 0–37)
Albumin: 4.1 g/dL (ref 3.5–5.2)
Alkaline Phosphatase: 53 U/L (ref 39–117)
Bilirubin, Direct: 0 mg/dL (ref 0.0–0.3)
Total Bilirubin: 0.5 mg/dL (ref 0.2–1.2)
Total Protein: 7.3 g/dL (ref 6.0–8.3)

## 2020-03-27 LAB — LIPID PANEL
Cholesterol: 145 mg/dL (ref 0–200)
HDL: 43.6 mg/dL (ref >39.00)
LDL Cholesterol: 87 mg/dL (ref 0–99)
NonHDL: 101.28
Total CHOL/HDL Ratio: 3
Triglycerides: 73 mg/dL (ref 0.0–149.0)
VLDL: 14.6 mg/dL (ref 0.0–40.0)

## 2020-03-27 LAB — MICROALBUMIN / CREATININE URINE RATIO
Creatinine,U: 123.5 mg/dL
Microalb Creat Ratio: 1 mg/g (ref 0.0–30.0)
Microalb, Ur: 1.2 mg/dL (ref 0.0–1.9)

## 2020-03-29 ENCOUNTER — Other Ambulatory Visit: Payer: Self-pay

## 2020-03-29 ENCOUNTER — Ambulatory Visit (INDEPENDENT_AMBULATORY_CARE_PROVIDER_SITE_OTHER): Payer: PPO | Admitting: Internal Medicine

## 2020-03-29 ENCOUNTER — Encounter: Payer: Self-pay | Admitting: Internal Medicine

## 2020-03-29 DIAGNOSIS — J4520 Mild intermittent asthma, uncomplicated: Secondary | ICD-10-CM

## 2020-03-29 DIAGNOSIS — E7800 Pure hypercholesterolemia, unspecified: Secondary | ICD-10-CM | POA: Diagnosis not present

## 2020-03-29 DIAGNOSIS — K219 Gastro-esophageal reflux disease without esophagitis: Secondary | ICD-10-CM | POA: Diagnosis not present

## 2020-03-29 DIAGNOSIS — I10 Essential (primary) hypertension: Secondary | ICD-10-CM | POA: Diagnosis not present

## 2020-03-29 DIAGNOSIS — R739 Hyperglycemia, unspecified: Secondary | ICD-10-CM | POA: Diagnosis not present

## 2020-03-29 DIAGNOSIS — R7989 Other specified abnormal findings of blood chemistry: Secondary | ICD-10-CM

## 2020-03-29 DIAGNOSIS — M25562 Pain in left knee: Secondary | ICD-10-CM

## 2020-03-29 DIAGNOSIS — R945 Abnormal results of liver function studies: Secondary | ICD-10-CM

## 2020-03-29 MED ORDER — ROSUVASTATIN CALCIUM 5 MG PO TABS: ORAL_TABLET | ORAL | 1 refills | Status: DC

## 2020-03-29 NOTE — Progress Notes (Signed)
Patient ID: Chloe Moyer, female   DOB: 12/28/1950, 68 y.o.   MRN: [id number]   Subjective:    Patient ID: Chloe Moyer, female    DOB: 04/19/1951, 68 y.o.   MRN: [id number]  HPI This visit occurred during the SARS-CoV-2 public health emergency.  Safety protocols were in place, including screening questions prior to the visit, additional usage of staff PPE, and extensive cleaning of exam room while observing appropriate contact time as indicated for disinfecting solutions.  Patient here for a scheduled follow up.  She reports she is doing relatively well.  Has been having issues with left knee.  S/p synvisc x 3.  Was doing relatively well until fell on Mother's day.  Landed on her knee.  Has been applying ice and heat.  Has appt with ortho tomorrow.  Breathing stable.  No chest pain.  No increased cough or congestion.  On xolair.  No abdominal pain or bowel change reported.    Past Medical History:  Diagnosis Date  . Allergic rhinitis   . Arthritis   . Asthma   . Diverticulosis, sigmoid   . GERD (gastroesophageal reflux disease)   . History of hiatal hernia   . Hypertension 06-12-2005  . Ulcer    Past Surgical History:  Procedure Laterality Date  . BREAST BIOPSY Right 09/01/2017   Affirm Bx of 2 areas- both areas were fibroadenomatoid change   . BREAST CYST ASPIRATION Left 1990  . CARDIAC CATHETERIZATION  July 2012  . CHOLECYSTECTOMY  1991  . LIPOMA EXCISION  4/99   Left flank area  . NASAL SINUS SURGERY  July 2012  . TUBAL LIGATION  1984   Family History  Problem Relation Age of Onset  . Emphysema Father        smoked  . Asthma Father   . Lung cancer Father        smoked  . Hypertension Father   . Hypertension Mother   . Cancer Mother        oral  . Breast cancer Maternal Grandmother 60  . Heart disease Paternal Grandfather        myocardial infarction  . Stroke Maternal Grandfather    Social History   Socioeconomic History  . Marital status: Married   Spouse name: Not on file  . Number of children: 2  . Years of education: Not on file  . Highest education level: Not on file  Occupational History  . Occupation: Works at Dentist office  Tobacco Use  . Smoking status: Never Smoker  . Smokeless tobacco: Never Used  Substance and Sexual Activity  . Alcohol use: Yes    Alcohol/week: 0.0 standard drinks    Comment: occ wine with dinner  . Drug use: No  . Sexual activity: Yes  Other Topics Concern  . Not on file  Social History Narrative  . Not on file   Social Determinants of Health   Financial Resource Strain: Low Risk   . Difficulty of Paying Living Expenses: Not hard at all  Food Insecurity: No Food Insecurity  . Worried About Running Out of Food in the Last Year: Never true  . Ran Out of Food in the Last Year: Never true  Transportation Needs: No Transportation Needs  . Lack of Transportation (Medical): No  . Lack of Transportation (Non-Medical): No  Physical Activity:   . Days of Exercise per Week:   . Minutes of Exercise per Session:   Stress: No Stress Concern Present  .   Feeling of Stress : Not at all  Social Connections:   . Frequency of Communication with Friends and Family:   . Frequency of Social Gatherings with Friends and Family:   . Attends Religious Services:   . Active Member of Clubs or Organizations:   . Attends Archivist Meetings:   Marland Kitchen Marital Status:     Outpatient Encounter Medications as of 03/29/2020  Medication Sig  . albuterol (PROVENTIL HFA;VENTOLIN HFA) 108 (90 BASE) MCG/ACT inhaler Inhale 2 puffs using inhaler every 4-6 hours as needed for SOB/wheeze.  Marland Kitchen ALPRAZolam (XANAX) 0.25 MG tablet Take 1 tablet (0.25 mg total) by mouth daily as needed for anxiety.  . budesonide (PULMICORT) 0.5 MG/2ML nebulizer solution Take 0.5 mg by nebulization 2 (two) times daily.  . budesonide-formoterol (SYMBICORT) 160-4.5 MCG/ACT inhaler Inhale 2 puffs into the lungs daily. as directed.  Marland Kitchen EPINEPHrine  (EPIPEN 2-PAK) 0.3 mg/0.3 mL IJ SOAJ injection Inject 0.3 mLs (0.3 mg total) into the muscle once.  . hydrocortisone (ANUSOL-HC) 25 MG suppository Place 1 suppository (25 mg total) rectally 2 (two) times daily as needed.  . Hypertonic Nasal Wash (SINUS RINSE NA) As directed three times daily   . losartan (COZAAR) 100 MG tablet TAKE 1 TABLET BY MOUTH ONCE DAILY  . omalizumab (XOLAIR) 150 MG injection 150 mg every 30 (thirty) days.   Marland Kitchen omeprazole (PRILOSEC) 10 MG capsule TAKE 1 CAPSULE BY MOUTH ONCE DAILY  . ipratropium-albuterol (DUONEB) 0.5-2.5 (3) MG/3ML SOLN   . meloxicam (MOBIC) 15 MG tablet Take 1 tablet (15 mg total) by mouth daily as needed.  . rosuvastatin (CRESTOR) 5 MG tablet Take one tablet q Monday and thursday   No facility-administered encounter medications on file as of 03/29/2020.    Review of Systems  Constitutional: Negative for appetite change and unexpected weight change.  HENT: Negative for congestion and sinus pressure.   Respiratory: Negative for cough, chest tightness and shortness of breath.   Cardiovascular: Negative for chest pain, palpitations and leg swelling.  Gastrointestinal: Negative for abdominal pain, diarrhea, nausea and vomiting.  Genitourinary: Negative for difficulty urinating and dysuria.  Musculoskeletal: Negative for joint swelling and myalgias.  Neurological: Negative for dizziness, light-headedness and headaches.  Psychiatric/Behavioral: Negative for agitation and dysphoric mood.       Objective:    Physical Exam Constitutional:      General: She is not in acute distress.    Appearance: Normal appearance.  HENT:     Head: Normocephalic and atraumatic.     Right Ear: External ear normal.     Left Ear: External ear normal.  Eyes:     General:        Right eye: No discharge.        Left eye: No discharge.     Conjunctiva/sclera: Conjunctivae normal.  Neck:     Thyroid: No thyromegaly.  Cardiovascular:     Rate and Rhythm: Normal rate  and regular rhythm.  Pulmonary:     Effort: No respiratory distress.     Breath sounds: Normal breath sounds. No wheezing.  Abdominal:     General: Bowel sounds are normal.     Palpations: Abdomen is soft.     Tenderness: There is no abdominal tenderness.  Musculoskeletal:        General: No swelling or tenderness.     Cervical back: Neck supple. No tenderness.  Lymphadenopathy:     Cervical: No cervical adenopathy.  Skin:    Findings: No erythema or rash.  Neurological:     Mental Status: She is alert.  Psychiatric:        Mood and Affect: Mood normal.        Behavior: Behavior normal.     BP (!) 130/94 (BP Location: Left Arm, Patient Position: Sitting)   Pulse 86   Temp (!) 97.1 F (36.2 C) (Temporal)   Ht 5' 5.98" (1.676 m)   Wt 176 lb 3.2 oz (79.9 kg)   LMP 11/03/1996   SpO2 98%   BMI 28.45 kg/m  Wt Readings from Last 3 Encounters:  03/29/20 176 lb 3.2 oz (79.9 kg)  09/22/19 172 lb (78 kg)  07/26/19 177 lb 9.6 oz (80.6 kg)     Lab Results  Component Value Date   WBC 3.5 (L) 03/27/2020   HGB 12.6 03/27/2020   HCT 36.8 03/27/2020   PLT 165.0 03/27/2020   GLUCOSE 100 (H) 03/27/2020   CHOL 145 03/27/2020   TRIG 73.0 03/27/2020   HDL 43.60 03/27/2020   LDLCALC 87 03/27/2020   ALT 24 03/27/2020   AST 26 03/27/2020   NA 137 03/27/2020   K 3.7 03/27/2020   CL 104 03/27/2020   CREATININE 0.76 03/27/2020   BUN 12 03/27/2020   CO2 27 03/27/2020   TSH 3.72 03/27/2020   INR 0.94 01/07/2017   HGBA1C 5.7 03/27/2020   MICROALBUR 1.2 03/27/2020    MM 3D SCREEN BREAST BILATERAL  Result Date: 09/06/2019 CLINICAL DATA:  Screening. EXAM: DIGITAL SCREENING BILATERAL MAMMOGRAM WITH TOMO AND CAD COMPARISON:  Previous exam(s). ACR Breast Density Category b: There are scattered areas of fibroglandular density. FINDINGS: There are no findings suspicious for malignancy. Images were processed with CAD. IMPRESSION: No mammographic evidence of malignancy. A result letter of  this screening mammogram will be mailed directly to the patient. RECOMMENDATION: Screening mammogram in one year. (Code:SM-B-01Y) BI-RADS CATEGORY  1: Negative. Electronically Signed   By: Susan  Turner M.D.   On: 09/06/2019 15:02       Assessment & Plan:   Problem List Items Addressed This Visit    Abnormal liver function tests    S/p liver biopsy.  Follow liver function tests.        Asthma    Breathing stable.  On xolair.  Follow.        Relevant Medications   ipratropium-albuterol (DUONEB) 0.5-2.5 (3) MG/3ML SOLN   GERD (gastroesophageal reflux disease)    On omeprazole.  Upper symptoms controlled.        Hypercholesterolemia    On crestor.  Follow lipid panel and liver function tests.        Relevant Medications   rosuvastatin (CRESTOR) 5 MG tablet   Other Relevant Orders   Hepatic function panel   Hyperglycemia    Low carb diet and exercise.  Follow met b and a1c.        Hypertension    Blood pressure as outlined.  On losartan.  Follow pressures.  Follow metabolic panel.       Relevant Medications   rosuvastatin (CRESTOR) 5 MG tablet   Left knee pain    S/p injection.  Recent fall.  Has f/u with ortho tomorrow.  Follow.            Charlene Scott, MD 

## 2020-03-30 DIAGNOSIS — M7632 Iliotibial band syndrome, left leg: Secondary | ICD-10-CM | POA: Diagnosis not present

## 2020-03-30 DIAGNOSIS — M1712 Unilateral primary osteoarthritis, left knee: Secondary | ICD-10-CM | POA: Diagnosis not present

## 2020-03-30 DIAGNOSIS — M76892 Other specified enthesopathies of left lower limb, excluding foot: Secondary | ICD-10-CM | POA: Diagnosis not present

## 2020-04-03 DIAGNOSIS — J4541 Moderate persistent asthma with (acute) exacerbation: Secondary | ICD-10-CM | POA: Diagnosis not present

## 2020-04-08 ENCOUNTER — Encounter: Payer: Self-pay | Admitting: Internal Medicine

## 2020-04-08 DIAGNOSIS — E7800 Pure hypercholesterolemia, unspecified: Secondary | ICD-10-CM | POA: Insufficient documentation

## 2020-04-08 DIAGNOSIS — M25562 Pain in left knee: Secondary | ICD-10-CM | POA: Insufficient documentation

## 2020-04-08 NOTE — Assessment & Plan Note (Signed)
S/p liver biopsy.  Follow liver function tests.

## 2020-04-08 NOTE — Assessment & Plan Note (Signed)
Breathing stable.  On xolair.  Follow.

## 2020-04-08 NOTE — Assessment & Plan Note (Signed)
On crestor.  Follow lipid panel and liver function tests.   

## 2020-04-08 NOTE — Assessment & Plan Note (Signed)
S/p injection.  Recent fall.  Has f/u with ortho tomorrow.  Follow.

## 2020-04-08 NOTE — Assessment & Plan Note (Signed)
Blood pressure as outlined.  On losartan.  Follow pressures.  Follow metabolic panel.  

## 2020-04-08 NOTE — Assessment & Plan Note (Signed)
Low carb diet and exercise.  Follow met b and a1c.   

## 2020-04-08 NOTE — Assessment & Plan Note (Signed)
On omeprazole.  Upper symptoms controlled.   

## 2020-04-11 DIAGNOSIS — M6281 Muscle weakness (generalized): Secondary | ICD-10-CM | POA: Diagnosis not present

## 2020-04-11 DIAGNOSIS — M25562 Pain in left knee: Secondary | ICD-10-CM | POA: Diagnosis not present

## 2020-04-11 DIAGNOSIS — G8929 Other chronic pain: Secondary | ICD-10-CM | POA: Diagnosis not present

## 2020-04-11 DIAGNOSIS — M25662 Stiffness of left knee, not elsewhere classified: Secondary | ICD-10-CM | POA: Diagnosis not present

## 2020-04-18 DIAGNOSIS — G8929 Other chronic pain: Secondary | ICD-10-CM | POA: Diagnosis not present

## 2020-04-18 DIAGNOSIS — M25562 Pain in left knee: Secondary | ICD-10-CM | POA: Diagnosis not present

## 2020-04-20 ENCOUNTER — Ambulatory Visit
Admission: EM | Admit: 2020-04-20 | Discharge: 2020-04-20 | Disposition: A | Discharge status: Home / Self Care | Payer: PPO

## 2020-04-20 ENCOUNTER — Other Ambulatory Visit: Payer: Self-pay

## 2020-04-20 DIAGNOSIS — I10 Essential (primary) hypertension: Secondary | ICD-10-CM | POA: Diagnosis not present

## 2020-04-20 DIAGNOSIS — J069 Acute upper respiratory infection, unspecified: Secondary | ICD-10-CM | POA: Diagnosis not present

## 2020-04-20 DIAGNOSIS — R05 Cough: Secondary | ICD-10-CM | POA: Diagnosis not present

## 2020-04-20 DIAGNOSIS — R059 Cough, unspecified: Secondary | ICD-10-CM

## 2020-04-20 NOTE — ED Triage Notes (Addendum)
Pt c/o runny nose and cough since Tuesday night. Taking OTC Zyrtec, "rinsing my nose with salt water" and using nebulizer without relief.  COVID+ in December, fully vaccinated as of March.

## 2020-04-20 NOTE — ED Provider Notes (Signed)
[facility]    CSN: [id number] Arrival date & time: 04/20/20  0803      History   Chief Complaint Chief Complaint  Patient presents with  . Nasal Congestion    HPI Chloe Moyer is a 69 y.o. female.   Patient presents with cough, congestion, runny nose x4 days.  She denies fever, chills, sore throat, shortness of breath, wheezing, abdominal pain, vomiting, diarrhea, rash, or other symptoms.  She has been treating her symptoms at home with Zyrtec, albuterol nebulizers.  She was COVID positive in December 2020 and has since been fully vaccinated.  She states she has not taken her blood pressure medication this morning yet.    The history is provided by the patient.    Past Medical History:  Diagnosis Date  . Allergic rhinitis   . Arthritis   . Asthma   . Diverticulosis, sigmoid   . GERD (gastroesophageal reflux disease)   . History of hiatal hernia   . Hypertension 06-12-2005  . Ulcer     Patient Active Problem List   Diagnosis Date Noted  . Hypercholesterolemia 04/08/2020  . Left knee pain 04/08/2020  . COVID-19 virus infection 10/31/2019  . Stress 09/25/2019  . Joint stiffness of hand 06/26/2018  . Plantar fasciitis 06/26/2018  . Left leg pain 02/21/2018  . Hyperglycemia 02/21/2018  . Visual disturbance 02/20/2016  . Near syncope 02/20/2016  . Right lower quadrant pain 08/04/2015  . Abnormal liver function tests 08/04/2015  . UTI symptoms 03/31/2015  . Health care maintenance 02/03/2015  . Cough 12/21/2014  . History of colonic polyps 05/29/2014  . Leukopenia 07/07/2013  . Asthma 10/10/2012  . Allergic rhinitis 10/10/2012  . GERD (gastroesophageal reflux disease) 10/10/2012  . Hypertension 10/10/2012    Past Surgical History:  Procedure Laterality Date  . BREAST BIOPSY Right 09/01/2017   Affirm Bx of 2 areas- both areas were fibroadenomatoid change   . BREAST CYST ASPIRATION Left 1990  . CARDIAC CATHETERIZATION  July 2012  . CHOLECYSTECTOMY   1991  . LIPOMA EXCISION  4/99   Left flank area  . NASAL SINUS SURGERY  July 2012  . TUBAL LIGATION  1984    OB History   No obstetric history on file.      Home Medications    Prior to Admission medications   Medication Sig Start Date End Date Taking? Authorizing Provider  celecoxib (CELEBREX) 200 MG capsule Take by mouth. 03/30/20  Yes [provider]  albuterol (PROVENTIL HFA;VENTOLIN HFA) 108 (90 BASE) MCG/ACT inhaler Inhale 2 puffs using inhaler every 4-6 hours as needed for SOB/wheeze.    [provider]  ALPRAZolam Duanne Moron) 0.25 MG tablet Take 1 tablet (0.25 mg total) by mouth daily as needed for anxiety. 09/22/19   Einar Pheasant, MD  budesonide (PULMICORT) 0.5 MG/2ML nebulizer solution Take 0.5 mg by nebulization 2 (two) times daily.    [provider]  budesonide-formoterol (SYMBICORT) 160-4.5 MCG/ACT inhaler Inhale 2 puffs into the lungs daily. as directed. 03/06/16   Einar Pheasant, MD  EPINEPHrine (EPIPEN 2-PAK) 0.3 mg/0.3 mL IJ SOAJ injection Inject 0.3 mLs (0.3 mg total) into the muscle once. 02/27/16   Einar Pheasant, MD  hydrocortisone (ANUSOL-HC) 25 MG suppository Place 1 suppository (25 mg total) rectally 2 (two) times daily as needed. 11/23/19   Einar Pheasant, MD  Hypertonic Nasal Wash (SINUS RINSE NA) As directed three times daily     [provider]  ipratropium-albuterol (DUONEB) 0.5-2.5 (3) MG/3ML SOLN  11/04/19   [provider]  losartan (COZAAR) 100 MG tablet TAKE 1 TABLET BY MOUTH ONCE DAILY 01/26/20   Einar Pheasant, MD  omalizumab Arvid Right) 150 MG injection 150 mg every 30 (thirty) days.  02/01/13   [provider]  omeprazole (PRILOSEC) 10 MG capsule TAKE 1 CAPSULE BY MOUTH ONCE DAILY 07/26/19   Einar Pheasant, MD  rosuvastatin (CRESTOR) 5 MG tablet Take one tablet q Monday and thursday 03/29/20   Einar Pheasant, MD    Family History Family History  Problem Relation Age of Onset  . Emphysema Father         smoked  . Asthma Father   . Lung cancer Father        smoked  . Hypertension Father   . Hypertension Mother   . Cancer Mother        oral  . Breast cancer Maternal Grandmother 73  . Heart disease Paternal Grandfather        myocardial infarction  . Stroke Maternal Grandfather     Social History Social History   Tobacco Use  . Smoking status: Never Smoker  . Smokeless tobacco: Never Used  Substance Use Topics  . Alcohol use: Yes    Alcohol/week: 0.0 standard drinks    Comment: occ wine with dinner  . Drug use: No     Allergies   Sulfamethoxazole-trimethoprim, Lisinopril, Septra [bactrim], and Sulfa antibiotics   Review of Systems Review of Systems  Constitutional: Negative for chills and fever.  HENT: Positive for congestion and rhinorrhea. Negative for ear pain and sore throat.   Eyes: Negative for pain and visual disturbance.  Respiratory: Positive for cough. Negative for shortness of breath.   Cardiovascular: Negative for chest pain and palpitations.  Gastrointestinal: Negative for abdominal pain, diarrhea, nausea and vomiting.  Genitourinary: Negative for dysuria and hematuria.  Musculoskeletal: Negative for arthralgias and back pain.  Skin: Negative for color change and rash.  Neurological: Negative for seizures and syncope.  All other systems reviewed and are negative.    Physical Exam Triage Vital Signs ED Triage Vitals  Enc Vitals Group     BP      Pulse      Resp      Temp      Temp src      SpO2      Weight      Height      Head Circumference      Peak Flow      Pain Score      Pain Loc      Pain Edu?      Excl. in [place]?    No data found.  Updated Vital Signs BP (!) 160/107   Pulse 85   Temp 97.8 F (36.6 C)   Resp 16   LMP 11/03/1996   SpO2 95%   Visual Acuity Right Eye Distance:   Left Eye Distance:   Bilateral Distance:    Right Eye Near:   Left Eye Near:    Bilateral Near:     Physical Exam Vitals and nursing note  reviewed.  Constitutional:      General: She is not in acute distress.    Appearance: She is well-developed. She is not ill-appearing.  HENT:     Head: Normocephalic and atraumatic.     Right Ear: Tympanic membrane normal.     Left Ear: Tympanic membrane normal.     Nose: Congestion and rhinorrhea present.     Mouth/Throat:  Mouth: Mucous membranes are moist.     Pharynx: Oropharynx is clear.  Eyes:     Conjunctiva/sclera: Conjunctivae normal.  Cardiovascular:     Rate and Rhythm: Normal rate and regular rhythm.     Heart sounds: No murmur.  Pulmonary:     Effort: Pulmonary effort is normal. No respiratory distress.     Breath sounds: Normal breath sounds. No wheezing or rhonchi.  Abdominal:     Palpations: Abdomen is soft.     Tenderness: There is no abdominal tenderness. There is no guarding or rebound.  Musculoskeletal:     Cervical back: Neck supple.  Skin:    General: Skin is warm and dry.     Findings: No rash.  Neurological:     General: No focal deficit present.     Mental Status: She is alert and oriented to person, place, and time.     Gait: Gait normal.  Psychiatric:        Mood and Affect: Mood normal.        Behavior: Behavior normal.      UC Treatments / Results  Labs (all labs ordered are listed, but only abnormal results are displayed) Labs Reviewed  NOVEL CORONAVIRUS, NAA    EKG   Radiology No results found.  Procedures Procedures (including critical care time)  Medications Ordered in UC Medications - No data to display  Initial Impression / Assessment and Plan / UC Course  I have reviewed the triage vital signs and the nursing notes.  Pertinent labs & imaging results that were available during my care of the patient were reviewed by me and considered in my medical decision making (see chart for details).    Upper respiratory infection, cough.  Elevated blood pressure with known hypertension.  Discussed symptomatic treatment with  patient including Tylenol and Mucinex as needed.  Instructed her to follow-up with her PCP if her symptoms are not improving.  Discussed with her that her blood pressure is elevated today and needs to be rechecked by her PCP in 2 to 4 weeks.  PCR COVID performed here.  Instructed patient to self quarantine until the test result is back.  Patient agrees to plan of care.    Final Clinical Impressions(s) / UC Diagnoses   Final diagnoses:  Cough  Elevated blood pressure reading in office with diagnosis of hypertension  Upper respiratory tract infection, unspecified type     Discharge Instructions     Take Tylenol as needed for discomfort and plain Mucinex as needed for congestion.  Follow up with your primary care provider if your symptoms are not improving.    Your blood pressure is elevated today at 160/107.  Please have this rechecked by your primary care provider in 2-4 weeks.         ED Prescriptions    None     PDMP not reviewed this encounter.   Sharion Balloon, NP 04/20/20 [phone]

## 2020-04-20 NOTE — Discharge Instructions (Addendum)
Take Tylenol as needed for discomfort and plain Mucinex as needed for congestion.  Follow up with your primary care provider if your symptoms are not improving.    Your blood pressure is elevated today at 160/107.  Please have this rechecked by your primary care provider in 2-4 weeks.

## 2020-04-21 LAB — SARS-COV-2, NAA 2 DAY TAT

## 2020-04-21 LAB — NOVEL CORONAVIRUS, NAA: SARS-CoV-2, NAA: NOT DETECTED

## 2020-04-23 ENCOUNTER — Ambulatory Visit: Payer: PPO | Admitting: Family Medicine

## 2020-04-23 DIAGNOSIS — Z03818 Encounter for observation for suspected exposure to other biological agents ruled out: Secondary | ICD-10-CM | POA: Diagnosis not present

## 2020-04-23 DIAGNOSIS — J069 Acute upper respiratory infection, unspecified: Secondary | ICD-10-CM | POA: Diagnosis not present

## 2020-04-23 DIAGNOSIS — Z20822 Contact with and (suspected) exposure to covid-19: Secondary | ICD-10-CM | POA: Diagnosis not present

## 2020-04-26 DIAGNOSIS — M25562 Pain in left knee: Secondary | ICD-10-CM | POA: Diagnosis not present

## 2020-04-26 DIAGNOSIS — G8929 Other chronic pain: Secondary | ICD-10-CM | POA: Diagnosis not present

## 2020-04-30 DIAGNOSIS — M25562 Pain in left knee: Secondary | ICD-10-CM | POA: Diagnosis not present

## 2020-04-30 DIAGNOSIS — G8929 Other chronic pain: Secondary | ICD-10-CM | POA: Diagnosis not present

## 2020-05-01 DIAGNOSIS — J4541 Moderate persistent asthma with (acute) exacerbation: Secondary | ICD-10-CM | POA: Diagnosis not present

## 2020-05-03 DIAGNOSIS — L578 Other skin changes due to chronic exposure to nonionizing radiation: Secondary | ICD-10-CM | POA: Diagnosis not present

## 2020-05-03 DIAGNOSIS — Z872 Personal history of diseases of the skin and subcutaneous tissue: Secondary | ICD-10-CM | POA: Diagnosis not present

## 2020-05-07 DIAGNOSIS — M25562 Pain in left knee: Secondary | ICD-10-CM | POA: Diagnosis not present

## 2020-05-07 DIAGNOSIS — M6281 Muscle weakness (generalized): Secondary | ICD-10-CM | POA: Diagnosis not present

## 2020-05-07 DIAGNOSIS — M25662 Stiffness of left knee, not elsewhere classified: Secondary | ICD-10-CM | POA: Diagnosis not present

## 2020-05-07 DIAGNOSIS — G8929 Other chronic pain: Secondary | ICD-10-CM | POA: Diagnosis not present

## 2020-05-07 DIAGNOSIS — M1712 Unilateral primary osteoarthritis, left knee: Secondary | ICD-10-CM | POA: Diagnosis not present

## 2020-05-09 DIAGNOSIS — G8929 Other chronic pain: Secondary | ICD-10-CM | POA: Diagnosis not present

## 2020-05-09 DIAGNOSIS — M25562 Pain in left knee: Secondary | ICD-10-CM | POA: Diagnosis not present

## 2020-05-10 ENCOUNTER — Other Ambulatory Visit: Payer: Self-pay

## 2020-05-10 ENCOUNTER — Other Ambulatory Visit (INDEPENDENT_AMBULATORY_CARE_PROVIDER_SITE_OTHER): Payer: PPO

## 2020-05-10 DIAGNOSIS — E7800 Pure hypercholesterolemia, unspecified: Secondary | ICD-10-CM

## 2020-05-10 DIAGNOSIS — M7632 Iliotibial band syndrome, left leg: Secondary | ICD-10-CM | POA: Diagnosis not present

## 2020-05-10 DIAGNOSIS — M76892 Other specified enthesopathies of left lower limb, excluding foot: Secondary | ICD-10-CM | POA: Diagnosis not present

## 2020-05-10 LAB — HEPATIC FUNCTION PANEL
ALT: 23 U/L (ref 0–35)
AST: 26 U/L (ref 0–37)
Albumin: 4.3 g/dL (ref 3.5–5.2)
Alkaline Phosphatase: 53 U/L (ref 39–117)
Bilirubin, Direct: 0.2 mg/dL (ref 0.0–0.3)
Total Bilirubin: 0.7 mg/dL (ref 0.2–1.2)
Total Protein: 7.2 g/dL (ref 6.0–8.3)

## 2020-05-11 ENCOUNTER — Other Ambulatory Visit: Payer: Self-pay | Admitting: Internal Medicine

## 2020-05-24 ENCOUNTER — Telehealth: Payer: Self-pay | Admitting: Internal Medicine

## 2020-05-24 NOTE — Telephone Encounter (Signed)
It appears that her blood pressure is starting to come down.  Confirm still doing ok.  If any acute symptoms, needs evaluated.  Have her spot check her pressure and call us with update.  If needs earlier appt - ok to schedule.  Can call in reading tomorrow.  Hold message until return call - to confirm f/u.

## 2020-05-24 NOTE — Telephone Encounter (Signed)
Pt aware and will call with update tomorrow.

## 2020-05-24 NOTE — Telephone Encounter (Signed)
Pt went to the dentist today and her BP was high they told her to call her Doctor 12      173/108  12:05  180/17 12:10 172/111

## 2020-05-24 NOTE — Telephone Encounter (Signed)
Called patient she [place]. She was stressing about the weather. She stated she was very stressed today. She has not had any other sx but just the high BP reading. No SOB, blurry vision, chest pain, palpitations, light headedness, arm px, jaw px, and rapid heart rates. Patient took a alprazolam when she came home, she stated she hasn't had to take one opf those in a long time. She was very antsy today. Had patient take BP at home and her BP was 143/102 BPM was 87.

## 2020-05-24 NOTE — Telephone Encounter (Signed)
Please triage her and confirm nothing acute is going on.

## 2020-05-25 ENCOUNTER — Telehealth: Payer: Self-pay | Admitting: Internal Medicine

## 2020-05-25 NOTE — Telephone Encounter (Signed)
Pt ok. Will spot check pressures. Has f/u in September

## 2020-05-25 NOTE — Telephone Encounter (Signed)
Blood pressure better.  Per note, pt ok.  Continue to spot check her pressure.  Can schedule f/u appt with me.  Any acute change, will need to be evaluated.

## 2020-05-25 NOTE — Telephone Encounter (Signed)
Pt called in with b/p reading 05-25-20 morning 131/85, noon reading 134/88

## 2020-05-25 NOTE — Telephone Encounter (Signed)
Update from yesterday. Pt doing ok.

## 2020-05-30 DIAGNOSIS — J4541 Moderate persistent asthma with (acute) exacerbation: Secondary | ICD-10-CM | POA: Diagnosis not present

## 2020-07-05 DIAGNOSIS — H2513 Age-related nuclear cataract, bilateral: Secondary | ICD-10-CM | POA: Diagnosis not present

## 2020-07-10 DIAGNOSIS — J4541 Moderate persistent asthma with (acute) exacerbation: Secondary | ICD-10-CM | POA: Diagnosis not present

## 2020-07-26 ENCOUNTER — Ambulatory Visit (INDEPENDENT_AMBULATORY_CARE_PROVIDER_SITE_OTHER): Payer: PPO

## 2020-07-26 VITALS — Ht 65.98 in | Wt 176.0 lb

## 2020-07-26 DIAGNOSIS — Z1231 Encounter for screening mammogram for malignant neoplasm of breast: Secondary | ICD-10-CM

## 2020-07-26 DIAGNOSIS — Z0000 Encounter for general adult medical examination without abnormal findings: Secondary | ICD-10-CM

## 2020-07-26 NOTE — Progress Notes (Addendum)
Subjective:   Chloe Moyer is a 69 y.o. female who presents for Medicare Annual (Subsequent) preventive examination.  Review of Systems    No ROS.  Medicare Wellness Virtual Visit.    Cardiac Risk Factors include: advanced age (>3men, >64 women);hypertension     Objective:    Today's Vitals   07/26/20 0903  Weight: 176 lb (79.8 kg)  Height: 5' 5.98" (1.676 m)   Body mass index is 28.42 kg/m.  Advanced Directives 07/26/2020 07/26/2019 07/20/2018 07/17/2017 01/07/2017 07/15/2016 02/18/2016  Does Patient Have a Medical Advance Directive? Yes Yes Yes No No No No  Type of Paramedic of [place];Living will [place];Living will [place];Living will - - - -  Does patient want to make changes to medical advance directive? No - Patient declined No - Patient declined No - Patient declined - - - -  Copy of [place] in Chart? No - copy requested No - copy requested No - copy requested - - - -  Would patient like information on creating a medical advance directive? - - - No - Patient declined Yes (MAU/Ambulatory/Procedural Areas - Information given) No - patient declined information -    Current Medications (verified) Outpatient Encounter Medications as of 07/26/2020  Medication Sig  . albuterol (PROVENTIL HFA;VENTOLIN HFA) 108 (90 BASE) MCG/ACT inhaler Inhale 2 puffs using inhaler every 4-6 hours as needed for SOB/wheeze.  Marland Kitchen ALPRAZolam (XANAX) 0.25 MG tablet Take 1 tablet (0.25 mg total) by mouth daily as needed for anxiety.  . budesonide (PULMICORT) 0.5 MG/2ML nebulizer solution Take 0.5 mg by nebulization 2 (two) times daily.  . budesonide-formoterol (SYMBICORT) 160-4.5 MCG/ACT inhaler Inhale 2 puffs into the lungs daily. as directed.  . celecoxib (CELEBREX) 200 MG capsule Take by mouth.  . EPINEPHrine (EPIPEN 2-PAK) 0.3 mg/0.3 mL IJ SOAJ injection Inject 0.3 mLs (0.3 mg total) into the muscle once.  .  hydrocortisone (ANUSOL-HC) 25 MG suppository Place 1 suppository (25 mg total) rectally 2 (two) times daily as needed.  . Hypertonic Nasal Wash (SINUS RINSE NA) As directed three times daily   . ipratropium-albuterol (DUONEB) 0.5-2.5 (3) MG/3ML SOLN   . losartan (COZAAR) 100 MG tablet TAKE 1 TABLET BY MOUTH ONCE DAILY  . omalizumab (XOLAIR) 150 MG injection 150 mg every 30 (thirty) days.   Marland Kitchen omeprazole (PRILOSEC) 10 MG capsule TAKE 1 CAPSULE BY MOUTH ONCE DAILY  . rosuvastatin (CRESTOR) 5 MG tablet Take one tablet q Monday and thursday   No facility-administered encounter medications on file as of 07/26/2020.    Allergies (verified) Sulfamethoxazole-trimethoprim, Lisinopril, Septra [bactrim], and Sulfa antibiotics   History: Past Medical History:  Diagnosis Date  . Allergic rhinitis   . Arthritis   . Asthma   . Diverticulosis, sigmoid   . GERD (gastroesophageal reflux disease)   . History of hiatal hernia   . Hypertension 06-12-2005  . Ulcer    Past Surgical History:  Procedure Laterality Date  . BREAST BIOPSY Right 09/01/2017   Affirm Bx of 2 areas- both areas were fibroadenomatoid change   . BREAST CYST ASPIRATION Left 1990  . CARDIAC CATHETERIZATION  July 2012  . CHOLECYSTECTOMY  1991  . LIPOMA EXCISION  4/99   Left flank area  . NASAL SINUS SURGERY  July 2012  . TUBAL LIGATION  1984   Family History  Problem Relation Age of Onset  . Emphysema Father        smoked  .  Asthma Father   . Lung cancer Father        smoked  . Hypertension Father   . Hypertension Mother   . Cancer Mother        oral  . Breast cancer Maternal Grandmother 20  . Heart disease Paternal Grandfather        myocardial infarction  . Stroke Maternal Grandfather    Social History   Socioeconomic History  . Marital status: Married    Spouse name: Not on file  . Number of children: 2  . Years of education: Not on file  . Highest education level: Not on file  Occupational History  .  Occupation: Works at [employer]  Tobacco Use  . Smoking status: Never Smoker  . Smokeless tobacco: Never Used  Substance and Sexual Activity  . Alcohol use: Yes    Alcohol/week: 0.0 standard drinks    Comment: occ wine with dinner  . Drug use: No  . Sexual activity: Yes  Other Topics Concern  . Not on file  Social History Narrative  . Not on file   Social Determinants of Health   Financial Resource Strain: Low Risk   . Difficulty of Paying Living Expenses: Not hard at all  Food Insecurity: No Food Insecurity  . Worried About Charity fundraiser in the Last Year: Never true  . Ran Out of Food in the Last Year: Never true  Transportation Needs: No Transportation Needs  . Lack of Transportation (Medical): No  . Lack of Transportation (Non-Medical): No  Physical Activity:   . Days of Exercise per Week: Not on file  . Minutes of Exercise per Session: Not on file  Stress: No Stress Concern Present  . Feeling of Stress : Not at all  Social Connections: Socially Integrated  . Frequency of Communication with Friends and Family: More than three times a week  . Frequency of Social Gatherings with Friends and Family: More than three times a week  . Attends Religious Services: More than 4 times per year  . Active Member of Clubs or Organizations: Yes  . Attends Archivist Meetings: More than 4 times per year  . Marital Status: Married    Tobacco Counseling Counseling given: Not Answered   Clinical Intake:  Pre-visit preparation completed: Yes        Diabetes: No  How often do you need to have someone help you when you read instructions, pamphlets, or other written materials from your doctor or pharmacy?: 1 - Never   Interpreter Needed?: No      Activities of Daily Living In your present state of health, do you have any difficulty performing the following activities: 07/26/2020  Hearing? N  Vision? N  Difficulty concentrating or making decisions? N    Walking or climbing stairs? N  Dressing or bathing? N  Doing errands, shopping? N  Preparing Food and eating ? N  Using the Toilet? N  In the past six months, have you accidently leaked urine? N  Do you have problems with loss of bowel control? N  Managing your Medications? N  Managing your Finances? N  Housekeeping or managing your Housekeeping? N  Some recent data might be hidden    Patient Care Team: Einar Pheasant, MD as PCP - General (Internal Medicine) Beverly Gust, MD as Referring Physician (Unknown Physician Specialty) Powers, Eloy End, MD as Referring Physician (Pulmonary Disease) Senior, Michel Santee, MD as Referring Physician (Otolaryngology)  Indicate any recent Medical Services  you may have received from other than [facility] providers in the past year (date may be approximate).     Assessment:   This is a routine wellness examination for Chloe Moyer.  I connected with Chloe Moyer today by telephone and verified that I am speaking with the correct person using two identifiers. Location patient: home Location provider: work Persons participating in the virtual visit: patient, Marine scientist.    I discussed the limitations, risks, security and privacy concerns of performing an evaluation and management service by telephone and the availability of in person appointments. The patient expressed understanding and verbally consented to this telephonic visit.    Interactive audio and video telecommunications were attempted between this provider and patient, however failed, due to patient having technical difficulties OR patient did not have access to video capability.  We continued and completed visit with audio only.  Some vital signs may be absent or patient reported.   Hearing/Vision screen  Hearing Screening   125Hz  250Hz  500Hz  1000Hz  2000Hz  3000Hz  4000Hz  6000Hz  8000Hz   Right ear:           Left ear:           Comments: Patient is able to hear conversational tones without difficulty.   No issues reported.   Vision Screening Comments: Followed by [facility] Wears corrective lenses Visual acuity not assessed, virtual visit.  They have seen their ophthalmologist in the last 12 months.    Dietary issues and exercise activities discussed: Current Exercise Habits: Home exercise routine, Type of exercise: walking, Intensity: Mild  Goals    . Follow up with Primary Care Provider     As needed      Depression Screen PHQ 2/9 Scores 07/26/2020 03/29/2020 07/26/2019 07/20/2018 07/17/2017 07/15/2016 03/21/2015  PHQ - 2 Score 0 0 0 0 0 0 0  PHQ- 9 Score - - - - 0 - -    Fall Risk Fall Risk  07/26/2020 03/29/2020 07/26/2019 07/20/2018 07/17/2017  Falls in the past year? 0 1 0 No Yes  Number falls in past yr: 0 0 - - 1  Injury with Fall? - 1 - - No  Comment - - - - Tripped over the cat  Follow up Falls evaluation completed Falls evaluation completed - - Falls prevention discussed   Handrails in use when using stairs? Yes Home free of loose throw rugs in walkways, pet beds, electrical cords, etc? Yes  Adequate lighting in your home to reduce risk of falls? Yes   ASSISTIVE DEVICES UTILIZED TO PREVENT FALLS:  Life alert? No  Use of a cane, walker or w/c? No  Grab bars in the bathroom? No  Shower chair or bench in shower? No  Elevated toilet seat or a handicapped toilet? Yes   TIMED UP AND GO:  Was the test performed? No . Virtual visit.   Cognitive Function: Patient is alert and oriented x3.  Denies difficulty focusing, making decisions, memory loss.   MMSE - Mini Mental State Exam 07/20/2018 07/17/2017 07/15/2016 03/21/2015  Orientation to time 5 5 5 5   Orientation to Place 5 5 5 5   Registration 3 3 3 3   Attention/ Calculation 5 5 5 5   Recall 3 3 3 3   Language- name 2 objects 2 2 2 2   Language- repeat 1 1 1 1   Language- follow 3 step command 3 3 3 3   Language- read & follow direction 1 1 1 1   Write a sentence 1 1 1 1   Copy design 1  1 1 1   Total score 30 30 30 30       6CIT Screen 07/26/2019  What Year? 0 points  What month? 0 points  What time? 0 points  Count back from 20 0 points  Months in reverse 0 points  Repeat phrase 0 points  Total Score 0   Immunizations Immunization History  Administered Date(s) Administered  . Fluad Quad(high Dose 65+) 07/26/2019  . Influenza Split 09/27/2012, 09/27/2012  . Influenza, High Dose Seasonal PF 07/17/2017, 08/26/2018  . Influenza,inj,Quad PF,6+ Mos 07/28/2014, 08/21/2015  . Influenza-Unspecified 11/28/2011, 08/21/2015, 07/08/2016  . PFIZER SARS-COV-2 Vaccination 12/26/2019, 01/20/2020  . Pneumococcal Conjugate-13 03/22/2015  . Pneumococcal Polysaccharide-23 07/15/2016  . Tdap 04/28/2013  . Zoster 02/23/2014  . Zoster Recombinat (Shingrix) 07/21/2018   Health Maintenance Health Maintenance  Topic Date Due  . INFLUENZA VACCINE  02/14/2021 (Originally 06/17/2020)  . COLONOSCOPY  07/26/2021 (Originally 05/13/2019)  . MAMMOGRAM  09/05/2021  . TETANUS/TDAP  04/29/2023  . DEXA SCAN  Completed  . COVID-19 Vaccine  Completed  . Hepatitis C Screening  Completed  . PNA vac Low Risk Adult  Completed   Colonoscopy- deferred due to patient preference.   Mammogram- ordered per patient preference. Agrees to call and schedule for October date.   Dental Screening: Recommended annual dental exams for proper oral hygiene. Visit every 6 months.   Community Resource Referral / Chronic Care Management: CRR required this visit?  No   CCM required this visit?  No      Plan:   Keep all routine maintenance appointments.   Next scheduled lab 09/26/20  Cpe 09/28/20   I have personally reviewed and noted the following in the patient's chart:   . Medical and social history . Use of alcohol, tobacco or illicit drugs  . Current medications and supplements . Functional ability and status . Nutritional status . Physical activity . Advanced directives . List of other physicians . Hospitalizations, surgeries, and  ER visits in previous 12 months . Vitals . Screenings to include cognitive, depression, and falls . Referrals and appointments  In addition, I have reviewed and discussed with patient certain preventive protocols, quality metrics, and best practice recommendations. A written personalized care plan for preventive services as well as general preventive health recommendations were provided to patient via mychart.     Varney Biles, LPN   01/19/1938     Reviewed above information.  Agree with assessment and plan.    Dr Nicki Reaper

## 2020-07-26 NOTE — Patient Instructions (Addendum)
Chloe Moyer , Thank you for taking time to come for your Medicare Wellness Visit. I appreciate your ongoing commitment to your health goals. Please review the following plan we discussed and let me know if I can assist you in the future.   Advanced directives: End of life planning; Advance aging; Advanced directives discussed.  Copy of current HCPOA/Living Will requested.    Conditions/risks identified: none new.  Follow up in one year for your annual wellness visit.   Preventive Care 31 Years and Older, Female Preventive care refers to lifestyle choices and visits with your health care provider that can promote health and wellness. What does preventive care include?  A yearly physical exam. This is also called an annual well check.  Dental exams once or twice a year.  Routine eye exams. Ask your health care provider how often you should have your eyes checked.  Personal lifestyle choices, including:  Daily care of your teeth and gums.  Regular physical activity.  Eating a healthy diet.  Avoiding tobacco and drug use.  Limiting alcohol use.  Practicing safe sex.  Taking low-dose aspirin every day.  Taking vitamin and mineral supplements as recommended by your health care provider. What happens during an annual well check? The services and screenings done by your health care provider during your annual well check will depend on your age, overall health, lifestyle risk factors, and family history of disease. Counseling  Your health care provider may ask you questions about your:  Alcohol use.  Tobacco use.  Drug use.  Emotional well-being.  Home and relationship well-being.  Sexual activity.  Eating habits.  History of falls.  Memory and ability to understand (cognition).  Work and work Statistician.  Reproductive health. Screening  You may have the following tests or measurements:  Height, weight, and BMI.  Blood pressure.  Lipid and cholesterol  levels. These may be checked every 5 years, or more frequently if you are over 89 years old.  Skin check.  Lung cancer screening. You may have this screening every year starting at age 8 if you have a 30-pack-year history of smoking and currently smoke or have quit within the past 15 years.  Fecal occult blood test (FOBT) of the stool. You may have this test every year starting at age 86.  Flexible sigmoidoscopy or colonoscopy. You may have a sigmoidoscopy every 5 years or a colonoscopy every 10 years starting at age 55.  Hepatitis C blood test.  Hepatitis B blood test.  Sexually transmitted disease (STD) testing.  Diabetes screening. This is done by checking your blood sugar (glucose) after you have not eaten for a while (fasting). You may have this done every 1-3 years.  Bone density scan. This is done to screen for osteoporosis. You may have this done starting at age 5.  Mammogram. This may be done every 1-2 years. Talk to your health care provider about how often you should have regular mammograms. Talk with your health care provider about your test results, treatment options, and if necessary, the need for more tests. Vaccines  Your health care provider may recommend certain vaccines, such as:  Influenza vaccine. This is recommended every year.  Tetanus, diphtheria, and acellular pertussis (Tdap, Td) vaccine. You may need a Td booster every 10 years.  Zoster vaccine. You may need this after age 16.  Pneumococcal 13-valent conjugate (PCV13) vaccine. One dose is recommended after age 26.  Pneumococcal polysaccharide (PPSV23) vaccine. One dose is recommended after age 73.  Talk to your health care provider about which screenings and vaccines you need and how often you need them. This information is not intended to replace advice given to you by your health care provider. Make sure you discuss any questions you have with your health care provider. Document Released: 11/30/2015  Document Revised: 07/23/2016 Document Reviewed: 09/04/2015 Elsevier Interactive Patient Education  2017 [place] Prevention in the Home Falls can cause injuries. They can happen to people of all ages. There are many things you can do to make your home safe and to help prevent falls. What can I do on the outside of my home?  Regularly fix the edges of walkways and driveways and fix any cracks.  Remove anything that might make you trip as you walk through a door, such as a raised step or threshold.  Trim any bushes or trees on the path to your home.  Use bright outdoor lighting.  Clear any walking paths of anything that might make someone trip, such as rocks or tools.  Regularly check to see if handrails are loose or broken. Make sure that both sides of any steps have handrails.  Any raised decks and porches should have guardrails on the edges.  Have any leaves, snow, or ice cleared regularly.  Use sand or salt on walking paths during winter.  Clean up any spills in your garage right away. This includes oil or grease spills. What can I do in the bathroom?  Use night lights.  Install grab bars by the toilet and in the tub and shower. Do not use towel bars as grab bars.  Use non-skid mats or decals in the tub or shower.  If you need to sit down in the shower, use a plastic, non-slip stool.  Keep the floor dry. Clean up any water that spills on the floor as soon as it happens.  Remove soap buildup in the tub or shower regularly.  Attach bath mats securely with double-sided non-slip rug tape.  Do not have throw rugs and other things on the floor that can make you trip. What can I do in the bedroom?  Use night lights.  Make sure that you have a light by your bed that is easy to reach.  Do not use any sheets or blankets that are too big for your bed. They should not hang down onto the floor.  Have a firm chair that has side arms. You can use this for support  while you get dressed.  Do not have throw rugs and other things on the floor that can make you trip. What can I do in the kitchen?  Clean up any spills right away.  Avoid walking on wet floors.  Keep items that you use a lot in easy-to-reach places.  If you need to reach something above you, use a strong step stool that has a grab bar.  Keep electrical cords out of the way.  Do not use floor polish or wax that makes floors slippery. If you must use wax, use non-skid floor wax.  Do not have throw rugs and other things on the floor that can make you trip. What can I do with my stairs?  Do not leave any items on the stairs.  Make sure that there are handrails on both sides of the stairs and use them. Fix handrails that are broken or loose. Make sure that handrails are as long as the stairways.  Check any carpeting to make sure  that it is firmly attached to the stairs. Fix any carpet that is loose or worn.  Avoid having throw rugs at the top or bottom of the stairs. If you do have throw rugs, attach them to the floor with carpet tape.  Make sure that you have a light switch at the top of the stairs and the bottom of the stairs. If you do not have them, ask someone to add them for you. What else can I do to help prevent falls?  Wear shoes that:  Do not have high heels.  Have rubber bottoms.  Are comfortable and fit you well.  Are closed at the toe. Do not wear sandals.  If you use a stepladder:  Make sure that it is fully opened. Do not climb a closed stepladder.  Make sure that both sides of the stepladder are locked into place.  Ask someone to hold it for you, if possible.  Clearly mark and make sure that you can see:  Any grab bars or handrails.  First and last steps.  Where the edge of each step is.  Use tools that help you move around (mobility aids) if they are needed. These include:  Canes.  Walkers.  Scooters.  Crutches.  Turn on the lights when  you go into a dark area. Replace any light bulbs as soon as they burn out.  Set up your furniture so you have a clear path. Avoid moving your furniture around.  If any of your floors are uneven, fix them.  If there are any pets around you, be aware of where they are.  Review your medicines with your doctor. Some medicines can make you feel dizzy. This can increase your chance of falling. Ask your doctor what other things that you can do to help prevent falls. This information is not intended to replace advice given to you by your health care provider. Make sure you discuss any questions you have with your health care provider. Document Released: 08/30/2009 Document Revised: 04/10/2016 Document Reviewed: 12/08/2014 Elsevier Interactive Patient Education  2017 [employer].  These are the goals we discussed: Goals    . Follow up with Primary Care Provider     As needed       This is a list of the screening recommended for you and due dates:  Health Maintenance  Topic Date Due  . Flu Shot  02/14/2021*  . Colon Cancer Screening  07/26/2021*  . Mammogram  09/05/2021  . Tetanus Vaccine  04/29/2023  . DEXA scan (bone density measurement)  Completed  . COVID-19 Vaccine  Completed  .  Hepatitis C: One time screening is recommended by Center for Disease Control  (CDC) for  adults born from [birth year] through 1967.   Completed  . Pneumonia vaccines  Completed  *Topic was postponed. The date shown is not the original due date.    Immunizations Immunization History  Administered Date(s) Administered  . Fluad Quad(high Dose 65+) 07/26/2019  . Influenza Split 09/27/2012, 09/27/2012  . Influenza, High Dose Seasonal PF 07/17/2017, 08/26/2018  . Influenza,inj,Quad PF,6+ Mos 07/28/2014, 08/21/2015  . Influenza-Unspecified 11/28/2011, 08/21/2015, 07/08/2016  . PFIZER SARS-COV-2 Vaccination 12/26/2019, 01/20/2020  . Pneumococcal Conjugate-13 03/22/2015  . Pneumococcal Polysaccharide-23 07/15/2016    . Tdap 04/28/2013  . Zoster 02/23/2014  . Zoster Recombinat (Shingrix) 07/21/2018    Keep all routine maintenance appointments.   Next scheduled lab 09/26/20  Cpe 09/28/20   Mammogram A mammogram is a low energy X-ray of the  breasts that is done to check for abnormal changes. This procedure can screen for and detect any changes that may indicate breast cancer. Mammograms are regularly done on women. A man may have a mammogram if he has a lump or swelling in his breast. A mammogram can also identify other changes and variations in the breast, such as:  Inflammation of the breast tissue (mastitis).  An infected area that contains a collection of pus (abscess).  A fluid-filled sac (cyst).  Fibrocystic changes. This is when breast tissue becomes denser, which can make the tissue feel rope-like or uneven under the skin.  Tumors that are not cancerous (benign). Tell a health care provider:  About any allergies you have.  If you have breast implants.  If you have had previous breast disease, biopsy, or surgery.  If you are breastfeeding.  If you are younger than age 78.  If you have a family history of breast cancer.  Whether you are pregnant or may be pregnant. What are the risks? Generally, this is a safe procedure. However, problems may occur, including:  Exposure to radiation. Radiation levels are very low with this test.  The results being misinterpreted.  The need for further tests.  The inability of the mammogram to detect certain cancers. What happens before the procedure?  Schedule your test about 1-2 weeks after your menstrual period if you are still menstruating. This is usually when your breasts are the least tender.  If you have had a mammogram done at a different facility in the past, get the mammogram X-rays or have them sent to your current exam facility. The new and old images will be compared.  Wash your breasts and underarms on the day of the  test.  Do not wear deodorants, perfumes, lotions, or powders anywhere on your body on the day of the test.  Remove any jewelry from your neck.  Wear clothes that you can change into and out of easily. What happens during the procedure?   You will undress from the waist up and put on a gown that opens in the front.  You will stand in front of the X-ray machine.  Each breast will be placed between two plastic or glass plates. The plates will compress your breast for a few seconds. Try to stay as relaxed as possible during the procedure. This does not cause any harm to your breasts and any discomfort you feel will be very brief.  X-rays will be taken from different angles of each breast. The procedure may vary among health care providers and hospitals. What happens after the procedure?  The mammogram will be examined by a specialist (radiologist).  You may need to repeat certain parts of the test, depending on the quality of the images. This is commonly done if the radiologist needs a better view of the breast tissue.  You may resume your normal activities.  It is up to you to get the results of your procedure. Ask your health care provider, or the department that is doing the procedure, when your results will be ready. Summary  A mammogram is a low energy X-ray of the breasts that is done to check for abnormal changes. A man may have a mammogram if he has a lump or swelling in his breast.  If you have had a mammogram done at a different facility in the past, get the mammogram X-rays or have them sent to your current exam facility in order to compare them.  Schedule  your test about 1-2 weeks after your menstrual period if you are still menstruating.  For this test, each breast will be placed between two plastic or glass plates. The plates will compress your breast for a few seconds.  Ask when your test results will be ready. Make sure you get your test results. This information is  not intended to replace advice given to you by your health care provider. Make sure you discuss any questions you have with your health care provider. Document Revised: 06/24/2018 Document Reviewed: 06/24/2018 Elsevier Patient Education  [place].

## 2020-07-27 ENCOUNTER — Other Ambulatory Visit: Payer: PPO

## 2020-07-31 ENCOUNTER — Encounter: Payer: PPO | Admitting: Internal Medicine

## 2020-07-31 DIAGNOSIS — Z23 Encounter for immunization: Secondary | ICD-10-CM | POA: Diagnosis not present

## 2020-07-31 DIAGNOSIS — J4540 Moderate persistent asthma, uncomplicated: Secondary | ICD-10-CM | POA: Diagnosis not present

## 2020-07-31 DIAGNOSIS — T7840XS Allergy, unspecified, sequela: Secondary | ICD-10-CM | POA: Diagnosis not present

## 2020-08-14 DIAGNOSIS — J4541 Moderate persistent asthma with (acute) exacerbation: Secondary | ICD-10-CM | POA: Diagnosis not present

## 2020-08-27 ENCOUNTER — Ambulatory Visit: Payer: PPO | Attending: Internal Medicine

## 2020-08-27 DIAGNOSIS — Z23 Encounter for immunization: Secondary | ICD-10-CM

## 2020-08-28 ENCOUNTER — Other Ambulatory Visit: Payer: Self-pay | Admitting: Internal Medicine

## 2020-09-11 DIAGNOSIS — J45909 Unspecified asthma, uncomplicated: Secondary | ICD-10-CM | POA: Diagnosis not present

## 2020-09-24 ENCOUNTER — Telehealth: Payer: Self-pay | Admitting: *Deleted

## 2020-09-24 DIAGNOSIS — I10 Essential (primary) hypertension: Secondary | ICD-10-CM

## 2020-09-24 DIAGNOSIS — D72819 Decreased white blood cell count, unspecified: Secondary | ICD-10-CM

## 2020-09-24 DIAGNOSIS — E7800 Pure hypercholesterolemia, unspecified: Secondary | ICD-10-CM

## 2020-09-24 DIAGNOSIS — R945 Abnormal results of liver function studies: Secondary | ICD-10-CM

## 2020-09-24 DIAGNOSIS — R7989 Other specified abnormal findings of blood chemistry: Secondary | ICD-10-CM

## 2020-09-24 DIAGNOSIS — R739 Hyperglycemia, unspecified: Secondary | ICD-10-CM

## 2020-09-24 NOTE — Telephone Encounter (Signed)
Please place future orders for lab appt.  

## 2020-09-25 ENCOUNTER — Other Ambulatory Visit: Payer: Self-pay | Admitting: Internal Medicine

## 2020-09-25 NOTE — Telephone Encounter (Signed)
Order placed for f/u labs.  

## 2020-09-26 ENCOUNTER — Other Ambulatory Visit: Payer: Self-pay

## 2020-09-26 ENCOUNTER — Other Ambulatory Visit (INDEPENDENT_AMBULATORY_CARE_PROVIDER_SITE_OTHER): Payer: PPO

## 2020-09-26 DIAGNOSIS — R945 Abnormal results of liver function studies: Secondary | ICD-10-CM

## 2020-09-26 DIAGNOSIS — D72819 Decreased white blood cell count, unspecified: Secondary | ICD-10-CM

## 2020-09-26 DIAGNOSIS — R7989 Other specified abnormal findings of blood chemistry: Secondary | ICD-10-CM

## 2020-09-26 DIAGNOSIS — I10 Essential (primary) hypertension: Secondary | ICD-10-CM

## 2020-09-26 DIAGNOSIS — R739 Hyperglycemia, unspecified: Secondary | ICD-10-CM

## 2020-09-26 DIAGNOSIS — E7800 Pure hypercholesterolemia, unspecified: Secondary | ICD-10-CM

## 2020-09-26 LAB — HEPATIC FUNCTION PANEL
ALT: 18 U/L (ref 0–35)
AST: 23 U/L (ref 0–37)
Albumin: 4.4 g/dL (ref 3.5–5.2)
Alkaline Phosphatase: 58 U/L (ref 39–117)
Bilirubin, Direct: 0.1 mg/dL (ref 0.0–0.3)
Total Bilirubin: 0.6 mg/dL (ref 0.2–1.2)
Total Protein: 7.6 g/dL (ref 6.0–8.3)

## 2020-09-26 LAB — CBC WITH DIFFERENTIAL/PLATELET
Basophils Absolute: 0 10*3/uL (ref 0.0–0.1)
Basophils Relative: 1.1 % (ref 0.0–3.0)
Eosinophils Absolute: 0.2 10*3/uL (ref 0.0–0.7)
Eosinophils Relative: 5.8 % — ABNORMAL HIGH (ref 0.0–5.0)
HCT: 37.5 % (ref 36.0–46.0)
Hemoglobin: 12.5 g/dL (ref 12.0–15.0)
Lymphocytes Relative: 36.9 % (ref 12.0–46.0)
Lymphs Abs: 1.2 10*3/uL (ref 0.7–4.0)
MCHC: 33.2 g/dL (ref 30.0–36.0)
MCV: 87.7 fl (ref 78.0–100.0)
Monocytes Absolute: 0.3 10*3/uL (ref 0.1–1.0)
Monocytes Relative: 9.9 % (ref 3.0–12.0)
Neutro Abs: 1.5 10*3/uL (ref 1.4–7.7)
Neutrophils Relative %: 46.3 % (ref 43.0–77.0)
Platelets: 175 10*3/uL (ref 150.0–400.0)
RBC: 4.28 Mil/uL (ref 3.87–5.11)
RDW: 14 % (ref 11.5–15.5)
WBC: 3.3 10*3/uL — ABNORMAL LOW (ref 4.0–10.5)

## 2020-09-26 LAB — BASIC METABOLIC PANEL
BUN: 12 mg/dL (ref 6–23)
CO2: 28 mEq/L (ref 19–32)
Calcium: 10 mg/dL (ref 8.4–10.5)
Chloride: 104 mEq/L (ref 96–112)
Creatinine, Ser: 0.76 mg/dL (ref 0.40–1.20)
GFR: 79.1 mL/min (ref >60.00)
Glucose, Bld: 99 mg/dL (ref 70–99)
Potassium: 3.9 mEq/L (ref 3.5–5.1)
Sodium: 138 mEq/L (ref 135–145)

## 2020-09-26 LAB — LIPID PANEL
Cholesterol: 145 mg/dL (ref 0–200)
HDL: 46.9 mg/dL (ref >39.00)
LDL Cholesterol: 78 mg/dL (ref 0–99)
NonHDL: 97.89
Total CHOL/HDL Ratio: 3
Triglycerides: 98 mg/dL (ref 0.0–149.0)
VLDL: 19.6 mg/dL (ref 0.0–40.0)

## 2020-09-26 LAB — HEMOGLOBIN A1C: Hgb A1c MFr Bld: 5.9 % (ref 4.6–6.5)

## 2020-09-28 ENCOUNTER — Encounter: Payer: Self-pay | Admitting: Internal Medicine

## 2020-09-28 ENCOUNTER — Other Ambulatory Visit: Payer: Self-pay

## 2020-09-28 ENCOUNTER — Ambulatory Visit (INDEPENDENT_AMBULATORY_CARE_PROVIDER_SITE_OTHER): Payer: PPO | Admitting: Internal Medicine

## 2020-09-28 VITALS — BP 124/76 | HR 83 | Temp 98.1°F | Resp 16 | Ht 66.0 in | Wt 173.6 lb

## 2020-09-28 DIAGNOSIS — I10 Essential (primary) hypertension: Secondary | ICD-10-CM

## 2020-09-28 DIAGNOSIS — E7800 Pure hypercholesterolemia, unspecified: Secondary | ICD-10-CM | POA: Diagnosis not present

## 2020-09-28 DIAGNOSIS — R739 Hyperglycemia, unspecified: Secondary | ICD-10-CM

## 2020-09-28 DIAGNOSIS — K219 Gastro-esophageal reflux disease without esophagitis: Secondary | ICD-10-CM

## 2020-09-28 DIAGNOSIS — K0889 Other specified disorders of teeth and supporting structures: Secondary | ICD-10-CM

## 2020-09-28 DIAGNOSIS — D72819 Decreased white blood cell count, unspecified: Secondary | ICD-10-CM

## 2020-09-28 DIAGNOSIS — R7989 Other specified abnormal findings of blood chemistry: Secondary | ICD-10-CM

## 2020-09-28 DIAGNOSIS — R945 Abnormal results of liver function studies: Secondary | ICD-10-CM

## 2020-09-28 DIAGNOSIS — J4520 Mild intermittent asthma, uncomplicated: Secondary | ICD-10-CM | POA: Diagnosis not present

## 2020-09-28 DIAGNOSIS — Z0000 Encounter for general adult medical examination without abnormal findings: Secondary | ICD-10-CM

## 2020-09-28 NOTE — Assessment & Plan Note (Addendum)
Physical today 09/28/20.  Mammogram scheduled 10/08/20.  Colonoscopy 2015.  Per note, recommended f/u colonoscopy in 5 years.  Discussed f/u colonoscopy.  She wants to hold at this time.  Will notify me when agreeable.

## 2020-09-28 NOTE — Progress Notes (Signed)
Patient ID: Chloe Moyer, female   DOB: 1951/07/26, 69 y.o.   MRN: [id number]   Subjective:    Patient ID: Chloe Moyer, female    DOB: [birth year], 69 y.o.   MRN: [id number]  HPI This visit occurred during the SARS-CoV-2 public health emergency.  Safety protocols were in place, including screening questions prior to the visit, additional usage of staff PPE, and extensive cleaning of exam room while observing appropriate contact time as indicated for disinfecting solutions.  Patient here for her physical exam.  Has had increased dental issues recently.  On abx now.  Started having issues in 06/2020.  Persistent issues.  Was started on abx last week.  Stopped after a couple of days, but then had return of increased swelling and pain.  Started back on abx yesterday.  Swelling and pain improved.  Discussed f/u with the dentist.  States otherwise doing relatively well.  No chest pain or sob reported.  No abdominal pain or bowel change reported.  Discussed labs.     Past Medical History:  Diagnosis Date   Allergic rhinitis    Arthritis    Asthma    Diverticulosis, sigmoid    GERD (gastroesophageal reflux disease)    History of hiatal hernia    Hypertension 06-12-2005   Ulcer    Past Surgical History:  Procedure Laterality Date   BREAST BIOPSY Right 09/01/2017   Affirm Bx of 2 areas- both areas were fibroadenomatoid change    BREAST CYST ASPIRATION Left 1990   CARDIAC CATHETERIZATION  July 2012   CHOLECYSTECTOMY  1991   LIPOMA EXCISION  4/99   Left flank area   NASAL SINUS SURGERY  July 2012   TUBAL LIGATION  1984   Family History  Problem Relation Age of Onset   Emphysema Father        smoked   Asthma Father    Lung cancer Father        smoked   Hypertension Father    Hypertension Mother    Cancer Mother        oral   Breast cancer Maternal Grandmother 65   Heart disease Paternal Grandfather        myocardial infarction   Stroke Maternal  Grandfather    Social History   Socioeconomic History   Marital status: Married    Spouse name: Not on file   Number of children: 2   Years of education: Not on file   Highest education level: Not on file  Occupational History   Occupation: Works at Environmental education [employer]  Tobacco Use   Smoking status: Never Smoker   Smokeless tobacco: Never Used  Substance and Sexual Activity   Alcohol use: Yes    Alcohol/week: 0.0 standard drinks    Comment: occ wine with dinner   Drug use: No   Sexual activity: Yes  Other Topics Concern   Not on file  Social History Narrative   Not on file   Social Determinants of Health   Financial Resource Strain: Low Risk    Difficulty of Paying Living Expenses: Not hard at all  Food Insecurity: No Food Insecurity   Worried About Charity fundraiser in the Last Year: Never true   [place] in the Last Year: Never true  Transportation Needs: No Transportation Needs   Lack of Transportation (Medical): No   Lack of Transportation (Non-Medical): No  Physical Activity:    Days of Exercise per Week: Not on file  Minutes of Exercise per Session: Not on file  Stress: No Stress Concern Present   Feeling of Stress : Not at all  Social Connections: Socially Integrated   Frequency of Communication with Friends and Family: More than three times a week   Frequency of Social Gatherings with Friends and Family: More than three times a week   Attends Religious Services: More than 4 times per year   Active Member of [employer] or Organizations: Yes   Attends Archivist Meetings: More than 4 times per year   Marital Status: Married    Outpatient Encounter Medications as of 09/28/2020  Medication Sig   albuterol (PROVENTIL HFA;VENTOLIN HFA) 108 (90 BASE) MCG/ACT inhaler Inhale 2 puffs using inhaler every 4-6 hours as needed for SOB/wheeze.   ALPRAZolam (XANAX) 0.25 MG tablet Take 1 tablet (0.25 mg total) by mouth daily as needed  for anxiety.   budesonide (PULMICORT) 0.5 MG/2ML nebulizer solution Take 0.5 mg by nebulization 2 (two) times daily.   budesonide-formoterol (SYMBICORT) 160-4.5 MCG/ACT inhaler Inhale 2 puffs into the lungs daily. as directed.   celecoxib (CELEBREX) 200 MG capsule Take by mouth.   EPINEPHrine (EPIPEN 2-PAK) 0.3 mg/0.3 mL IJ SOAJ injection Inject 0.3 mLs (0.3 mg total) into the muscle once.   hydrocortisone (ANUSOL-HC) 25 MG suppository Place 1 suppository (25 mg total) rectally 2 (two) times daily as needed.   Hypertonic Nasal Wash (SINUS RINSE NA) As directed three times daily    ipratropium-albuterol (DUONEB) 0.5-2.5 (3) MG/3ML SOLN    losartan (COZAAR) 100 MG tablet TAKE 1 TABLET BY MOUTH ONCE DAILY   omalizumab (XOLAIR) 150 MG injection 150 mg every 30 (thirty) days.    omeprazole (PRILOSEC) 10 MG capsule TAKE 1 CAPSULE BY MOUTH ONCE DAILY   rosuvastatin (CRESTOR) 5 MG tablet TAKE 1 TABLET BY MOUTH ON MONDAY AND THURSDAY   No facility-administered encounter medications on file as of 09/28/2020.    Review of Systems  Constitutional: Negative for unexpected weight change.       Given increased dental issues - has had to adjust her diet.  Eating softer foods, etc.   HENT: Negative for congestion, sinus pressure and sore throat.   Eyes: Negative for pain and visual disturbance.  Respiratory: Negative for cough, chest tightness and shortness of breath.   Cardiovascular: Negative for chest pain, palpitations and leg swelling.  Gastrointestinal: Negative for abdominal pain, diarrhea, nausea and vomiting.  Genitourinary: Negative for difficulty urinating and dysuria.  Musculoskeletal: Negative for joint swelling and myalgias.  Skin: Negative for color change and rash.  Neurological: Negative for dizziness, light-headedness and headaches.  Hematological: Negative for adenopathy. Does not bruise/bleed easily.  Psychiatric/Behavioral: Negative for agitation and dysphoric mood.        Objective:    Physical Exam Vitals reviewed.  Constitutional:      General: She is not in acute distress.    Appearance: Normal appearance. She is well-developed.  HENT:     Head: Normocephalic and atraumatic.     Right Ear: External ear normal.     Left Ear: External ear normal.  Eyes:     General: No scleral icterus.       Right eye: No discharge.        Left eye: No discharge.     Conjunctiva/sclera: Conjunctivae normal.  Neck:     Thyroid: No thyromegaly.  Cardiovascular:     Rate and Rhythm: Normal rate and regular rhythm.  Pulmonary:     Effort: No  tachypnea, accessory muscle usage or respiratory distress.     Breath sounds: Normal breath sounds. No decreased breath sounds or wheezing.  Chest:     Breasts:        Right: No inverted nipple, mass, nipple discharge or tenderness (no axillary adenopathy).        Left: No inverted nipple, mass, nipple discharge or tenderness (no axilarry adenopathy).  Abdominal:     General: Bowel sounds are normal.     Palpations: Abdomen is soft.     Tenderness: There is no abdominal tenderness.  Musculoskeletal:        General: No swelling or tenderness.     Cervical back: Neck supple. No tenderness.  Lymphadenopathy:     Cervical: No cervical adenopathy.  Skin:    Findings: No erythema or rash.  Neurological:     Mental Status: She is alert and oriented to person, place, and time.  Psychiatric:        Mood and Affect: Mood normal.        Behavior: Behavior normal.     BP 124/76    Pulse 83    Temp 98.1 F (36.7 C) (Oral)    Resp 16    Ht '5\' 6"'  (1.676 m)    Wt 173 lb 9.6 oz (78.7 kg)    LMP 11/03/1996    SpO2 98%    BMI 28.02 kg/m  Wt Readings from Last 3 Encounters:  09/28/20 173 lb 9.6 oz (78.7 kg)  07/26/20 176 lb (79.8 kg)  03/29/20 176 lb 3.2 oz (79.9 kg)     Lab Results  Component Value Date   WBC 3.3 (L) 09/26/2020   HGB 12.5 09/26/2020   HCT 37.5 09/26/2020   PLT 175.0 09/26/2020   GLUCOSE 99 09/26/2020    CHOL 145 09/26/2020   TRIG 98.0 09/26/2020   HDL 46.90 09/26/2020   LDLCALC 78 09/26/2020   ALT 18 09/26/2020   AST 23 09/26/2020   NA 138 09/26/2020   K 3.9 09/26/2020   CL 104 09/26/2020   CREATININE 0.76 09/26/2020   BUN 12 09/26/2020   CO2 28 09/26/2020   TSH 3.72 03/27/2020   INR 0.94 01/07/2017   HGBA1C 5.9 09/26/2020   MICROALBUR 1.2 03/27/2020       Assessment & Plan:   Problem List Items Addressed This Visit    Pain, dental    Dental pain and issues as outlined.  On abx.  Continue f/u with dentist.       Leukopenia    White blood cell count slightly decreased.  Recheck cbc in 2 months.        Relevant Orders   CBC with Differential/Platelet   Hypertension    On losartan.  Blood pressure as outlined.  Follow pressures.  Follow metabolic panel.        Hyperglycemia    Low carb diet and exercise.  Follow met b and a1c.        Hypercholesterolemia    On crestor.  Low cholesterol diet and exercise.  Follow lipid panel and liver function tests.   Lab Results  Component Value Date   CHOL 145 09/26/2020   HDL 46.90 09/26/2020   LDLCALC 78 09/26/2020   TRIG 98.0 09/26/2020   CHOLHDL 3 09/26/2020        Health care maintenance    Physical today 09/28/20.  Mammogram scheduled 10/08/20.  Colonoscopy 2015.  Per note, recommended f/u colonoscopy in 5 years.  Discussed f/u colonoscopy.  She wants to hold at this time.  Will notify me when agreeable.       GERD (gastroesophageal reflux disease)    No upper symptoms reported. On omeprazole.       Asthma    Breathing stable.  Continue current inhaler regimen.       Abnormal liver function tests    S/p liver biopsy.  Recent liver panel wnl.           Einar Pheasant, MD

## 2020-09-29 ENCOUNTER — Encounter: Payer: Self-pay | Admitting: Internal Medicine

## 2020-09-29 DIAGNOSIS — K0889 Other specified disorders of teeth and supporting structures: Secondary | ICD-10-CM | POA: Insufficient documentation

## 2020-09-29 NOTE — Assessment & Plan Note (Signed)
On losartan.  Blood pressure as outlined.  Follow pressures.  Follow metabolic panel.

## 2020-09-29 NOTE — Assessment & Plan Note (Signed)
Low carb diet and exercise.  Follow met b and a1c.   

## 2020-09-29 NOTE — Assessment & Plan Note (Signed)
S/p liver biopsy.  Recent liver panel wnl.

## 2020-09-29 NOTE — Assessment & Plan Note (Signed)
Dental pain and issues as outlined.  On abx.  Continue f/u with dentist.

## 2020-09-29 NOTE — Assessment & Plan Note (Signed)
On crestor.  Low cholesterol diet and exercise.  Follow lipid panel and liver function tests.   Lab Results  Component Value Date   CHOL 145 09/26/2020   HDL 46.90 09/26/2020   LDLCALC 78 09/26/2020   TRIG 98.0 09/26/2020   CHOLHDL 3 09/26/2020

## 2020-09-29 NOTE — Assessment & Plan Note (Signed)
White blood cell count slightly decreased.  Recheck cbc in 2 months.

## 2020-09-29 NOTE — Assessment & Plan Note (Signed)
No upper symptoms reported.  On omeprazole.  

## 2020-09-29 NOTE — Assessment & Plan Note (Addendum)
Breathing stable.  Continue current inhaler regimen.   

## 2020-10-22 ENCOUNTER — Other Ambulatory Visit: Payer: Self-pay

## 2020-10-22 ENCOUNTER — Ambulatory Visit
Admission: RE | Admit: 2020-10-22 | Discharge: 2020-10-22 | Disposition: A | Discharge status: Home / Self Care | Payer: PPO | Source: Ambulatory Visit | Attending: Internal Medicine | Admitting: Internal Medicine

## 2020-10-22 DIAGNOSIS — Z1231 Encounter for screening mammogram for malignant neoplasm of breast: Secondary | ICD-10-CM | POA: Diagnosis not present

## 2020-10-24 ENCOUNTER — Other Ambulatory Visit: Payer: Self-pay

## 2020-10-24 ENCOUNTER — Other Ambulatory Visit (INDEPENDENT_AMBULATORY_CARE_PROVIDER_SITE_OTHER): Payer: PPO

## 2020-10-24 ENCOUNTER — Telehealth (INDEPENDENT_AMBULATORY_CARE_PROVIDER_SITE_OTHER): Payer: PPO | Admitting: Family Medicine

## 2020-10-24 ENCOUNTER — Encounter: Payer: Self-pay | Admitting: Family Medicine

## 2020-10-24 VITALS — Ht 66.0 in | Wt 174.0 lb

## 2020-10-24 DIAGNOSIS — R300 Dysuria: Secondary | ICD-10-CM | POA: Diagnosis not present

## 2020-10-24 LAB — POCT URINALYSIS DIPSTICK
Bilirubin, UA: NEGATIVE
Blood, UA: NEGATIVE
Glucose, UA: NEGATIVE
Ketones, UA: NEGATIVE
Nitrite, UA: NEGATIVE
Protein, UA: NEGATIVE
Spec Grav, UA: 1.02 (ref 1.010–1.025)
Urobilinogen, UA: 0.2 E.U./dL
pH, UA: 7 (ref 5.0–8.0)

## 2020-10-24 LAB — URINALYSIS, MICROSCOPIC ONLY: RBC / HPF: NONE SEEN (ref 0–?)

## 2020-10-24 MED ORDER — CEPHALEXIN 500 MG PO CAPS: 500.0000 mg | ORAL_CAPSULE | Freq: Four times a day (QID) | ORAL | 0 refills | Status: DC

## 2020-10-24 NOTE — Assessment & Plan Note (Signed)
Symptoms consistent with a UTI. She will come in for a urine test to be completed. We will treat empirically with keflex. She will start this after providing her urine sample.

## 2020-10-24 NOTE — Addendum Note (Signed)
Addended by: Tor Netters I on: 10/24/2020 11:31 AM   Modules accepted: Orders

## 2020-10-24 NOTE — Progress Notes (Signed)
Virtual Visit via telephone Note  This visit type was conducted due to national recommendations for restrictions regarding the COVID-19 pandemic (e.g. social distancing).  This format is felt to be most appropriate for this patient at this time.  All issues noted in this document were discussed and addressed.  No physical exam was performed (except for noted visual exam findings with Video Visits).   I connected with Beatris Ship today at  8:30 AM EST by a video enabled telemedicine application or telephone and verified that I am speaking with the correct person using two identifiers. Location patient: home Location provider: work  Persons participating in the virtual visit: patient, provider  I discussed the limitations, risks, security and privacy concerns of performing an evaluation and management service by telephone and the availability of in person appointments. I also discussed with the patient that there may be a patient responsible charge related to this service. The patient expressed understanding and agreed to proceed.  Interactive audio and video telecommunications were attempted between this provider and patient, however failed, due to patient having technical difficulties OR patient did not have access to video capability.  We continued and completed visit with audio only.   Reason for visit: same day visit  HPI: UTI: Dysuria- yes, started 3 days ago Frequency- mild  Urgency- yes  Hematuria- no  Fever- no Abd pain- no  Vaginal d/c- no The patient was on amoxicillin for a root canal until last Wednesday. She has tried azo and cranberry juice.     ROS: See pertinent positives and negatives per HPI.  Past Medical History:  Diagnosis Date  . Allergic rhinitis   . Arthritis   . Asthma   . Diverticulosis, sigmoid   . GERD (gastroesophageal reflux disease)   . History of hiatal hernia   . Hypertension 06-12-2005  . Ulcer     Past Surgical History:  Procedure Laterality  Date  . BREAST BIOPSY Right 09/01/2017   Affirm Bx of 2 areas- both areas were fibroadenomatoid change   . BREAST CYST ASPIRATION Left 1990  . CARDIAC CATHETERIZATION  July 2012  . CHOLECYSTECTOMY  1991  . LIPOMA EXCISION  4/99   Left flank area  . NASAL SINUS SURGERY  July 2012  . TUBAL LIGATION  1984    Family History  Problem Relation Age of Onset  . Emphysema Father        smoked  . Asthma Father   . Lung cancer Father        smoked  . Hypertension Father   . Hypertension Mother   . Cancer Mother        oral  . Breast cancer Maternal Grandmother 24  . Heart disease Paternal Grandfather        myocardial infarction  . Stroke Maternal Grandfather      Current Outpatient Medications:  .  albuterol (PROVENTIL HFA;VENTOLIN HFA) 108 (90 BASE) MCG/ACT inhaler, Inhale 2 puffs using inhaler every 4-6 hours as needed for SOB/wheeze., Disp: , Rfl:  .  ALPRAZolam (XANAX) 0.25 MG tablet, Take 1 tablet (0.25 mg total) by mouth daily as needed for anxiety., Disp: 20 tablet, Rfl: 0 .  budesonide (PULMICORT) 0.5 MG/2ML nebulizer solution, Take 0.5 mg by nebulization 2 (two) times daily., Disp: , Rfl:  .  budesonide-formoterol (SYMBICORT) 160-4.5 MCG/ACT inhaler, Inhale 2 puffs into the lungs daily. as directed., Disp: 1 Inhaler, Rfl: 2 .  celecoxib (CELEBREX) 200 MG capsule, Take by mouth., Disp: , Rfl:  .  EPINEPHrine (EPIPEN 2-PAK) 0.3 mg/0.3 mL IJ SOAJ injection, Inject 0.3 mLs (0.3 mg total) into the muscle once., Disp: 1 Device, Rfl: 1 .  hydrocortisone (ANUSOL-HC) 25 MG suppository, Place 1 suppository (25 mg total) rectally 2 (two) times daily as needed., Disp: 14 suppository, Rfl: 0 .  Hypertonic Nasal Wash (SINUS RINSE NA), As directed three times daily , Disp: , Rfl:  .  ipratropium-albuterol (DUONEB) 0.5-2.5 (3) MG/3ML SOLN, , Disp: , Rfl:  .  losartan (COZAAR) 100 MG tablet, TAKE 1 TABLET BY MOUTH ONCE DAILY, Disp: 30 tablet, Rfl: 2 .  omalizumab (XOLAIR) 150 MG injection, 150  mg every 30 (thirty) days. , Disp: , Rfl:  .  omeprazole (PRILOSEC) 10 MG capsule, TAKE 1 CAPSULE BY MOUTH ONCE DAILY, Disp: 30 capsule, Rfl: 11 .  rosuvastatin (CRESTOR) 5 MG tablet, TAKE 1 TABLET BY MOUTH ON MONDAY AND THURSDAY, Disp: 28 tablet, Rfl: 1 .  cephALEXin (KEFLEX) 500 MG capsule, Take 1 capsule (500 mg total) by mouth 4 (four) times daily., Disp: 28 capsule, Rfl: 0  EXAM:  VITALS per patient if applicable:  GENERAL: alert, oriented, appears well and in no acute distress  HEENT: atraumatic, conjunttiva clear, no obvious abnormalities on inspection of external nose and ears  NECK: normal movements of the head and neck  LUNGS: on inspection no signs of respiratory distress, breathing rate appears normal, no obvious gross SOB, gasping or wheezing  CV: no obvious cyanosis  MS: moves all visible extremities without noticeable abnormality  PSYCH/NEURO: pleasant and cooperative, no obvious depression or anxiety, speech and thought processing grossly intact  ASSESSMENT AND PLAN:  Discussed the following assessment and plan:  Problem List Items Addressed This Visit    Dysuria - Primary    Symptoms consistent with a UTI. She will come in for a urine test to be completed. We will treat empirically with keflex. She will start this after providing her urine sample.       Relevant Medications   cephALEXin (KEFLEX) 500 MG capsule   Other Relevant Orders   POCT urinalysis dipstick   Urine Culture   Urine Microscopic       I discussed the assessment and treatment plan with the patient. The patient was provided an opportunity to ask questions and all were answered. The patient agreed with the plan and demonstrated an understanding of the instructions.   The patient was advised to call back or seek an in-person evaluation if the symptoms worsen or if the condition fails to improve as anticipated.  I provided 6 minutes of non-face-to-face time during this encounter.   Tommi Rumps, MD

## 2020-10-26 LAB — URINE CULTURE
MICRO NUMBER:: 11292383
SPECIMEN QUALITY:: ADEQUATE

## 2020-10-27 ENCOUNTER — Other Ambulatory Visit: Payer: Self-pay | Admitting: Internal Medicine

## 2020-10-27 MED ORDER — CEFDINIR 300 MG PO CAPS: 300.0000 mg | ORAL_CAPSULE | Freq: Two times a day (BID) | ORAL | 0 refills | Status: DC

## 2020-10-27 NOTE — Progress Notes (Signed)
Pt notified of urine culture results.  omnicef 300mg  sent in to [facility].

## 2020-10-30 ENCOUNTER — Encounter: Payer: Self-pay | Admitting: Internal Medicine

## 2020-10-30 DIAGNOSIS — M545 Low back pain, unspecified: Secondary | ICD-10-CM

## 2020-10-31 NOTE — Telephone Encounter (Signed)
See my chart message.  Pt wants to provide urine sample.  Her husband is coming in Thursday.  See her message.

## 2020-11-01 ENCOUNTER — Other Ambulatory Visit: Payer: Self-pay

## 2020-11-01 ENCOUNTER — Other Ambulatory Visit (INDEPENDENT_AMBULATORY_CARE_PROVIDER_SITE_OTHER): Payer: PPO

## 2020-11-01 ENCOUNTER — Other Ambulatory Visit: Payer: Self-pay | Admitting: Internal Medicine

## 2020-11-01 DIAGNOSIS — M545 Low back pain, unspecified: Secondary | ICD-10-CM

## 2020-11-01 LAB — URINALYSIS, ROUTINE W REFLEX MICROSCOPIC
Bilirubin Urine: NEGATIVE
Hgb urine dipstick: NEGATIVE
Ketones, ur: NEGATIVE
Nitrite: NEGATIVE
Specific Gravity, Urine: >=1.03 — AB (ref 1.000–1.030)
Total Protein, Urine: NEGATIVE
Urine Glucose: NEGATIVE
Urobilinogen, UA: 0.2 (ref 0.0–1.0)
pH: 6 (ref 5.0–8.0)

## 2020-11-01 NOTE — Telephone Encounter (Signed)
LMTCB

## 2020-11-01 NOTE — Telephone Encounter (Signed)
Pt dropped off urine

## 2020-11-01 NOTE — Progress Notes (Signed)
Order placed for urine and culture

## 2020-11-02 LAB — URINE CULTURE
MICRO NUMBER:: 11326529
SPECIMEN QUALITY:: ADEQUATE

## 2020-11-07 ENCOUNTER — Encounter: Payer: Self-pay | Admitting: Internal Medicine

## 2020-11-07 ENCOUNTER — Other Ambulatory Visit (HOSPITAL_COMMUNITY)
Admission: RE | Admit: 2020-11-07 | Discharge: 2020-11-07 | Disposition: A | Discharge status: Home / Self Care | Payer: PPO | Source: Ambulatory Visit | Attending: Internal Medicine | Admitting: Internal Medicine

## 2020-11-07 ENCOUNTER — Ambulatory Visit (INDEPENDENT_AMBULATORY_CARE_PROVIDER_SITE_OTHER): Payer: PPO

## 2020-11-07 ENCOUNTER — Ambulatory Visit (INDEPENDENT_AMBULATORY_CARE_PROVIDER_SITE_OTHER): Payer: PPO | Admitting: Internal Medicine

## 2020-11-07 ENCOUNTER — Other Ambulatory Visit: Payer: Self-pay

## 2020-11-07 VITALS — BP 134/76 | HR 89 | Temp 98.1°F | Resp 15 | Ht 66.0 in | Wt 170.8 lb

## 2020-11-07 DIAGNOSIS — J4520 Mild intermittent asthma, uncomplicated: Secondary | ICD-10-CM

## 2020-11-07 DIAGNOSIS — R300 Dysuria: Secondary | ICD-10-CM | POA: Diagnosis not present

## 2020-11-07 DIAGNOSIS — N898 Other specified noninflammatory disorders of vagina: Secondary | ICD-10-CM

## 2020-11-07 DIAGNOSIS — M545 Low back pain, unspecified: Secondary | ICD-10-CM

## 2020-11-07 DIAGNOSIS — M549 Dorsalgia, unspecified: Secondary | ICD-10-CM | POA: Insufficient documentation

## 2020-11-07 DIAGNOSIS — I10 Essential (primary) hypertension: Secondary | ICD-10-CM

## 2020-11-07 LAB — URINALYSIS, MICROSCOPIC ONLY: RBC / HPF: NONE SEEN (ref 0–?)

## 2020-11-07 LAB — POCT URINALYSIS DIPSTICK
Bilirubin, UA: NEGATIVE
Blood, UA: NEGATIVE
Glucose, UA: NEGATIVE
Ketones, UA: POSITIVE
Nitrite, UA: POSITIVE
Protein, UA: NEGATIVE
Spec Grav, UA: >=1.03 — AB (ref 1.010–1.025)
Urobilinogen, UA: 4 E.U./dL — AB
pH, UA: 6 (ref 5.0–8.0)

## 2020-11-07 NOTE — Progress Notes (Signed)
Patient ID: TRINATY BRESSAN, female   DOB: [birth year], 69 y.o.   MRN: [id number]   Subjective:    Patient ID: CARMILITA KRZYWICKI, female    DOB: Jun 28, 1951, 69 y.o.   MRN: [id number]  HPI This visit occurred during the SARS-CoV-2 public health emergency.  Safety protocols were in place, including screening questions prior to the visit, additional usage of staff PPE, and extensive cleaning of exam room while observing appropriate contact time as indicated for disinfecting solutions.  Patient here as a work in appt.  Was seen 10/24/20 - acute visit - for dysuria.  Note reviewed.  UTI.  Treated.  Had persistent symptoms.  F/u urine ok.  Still with some back discomfort - episodes.  Notes reviewed.  In for appt.  No urinary symptoms. Now.  Discussed pelvic exam to confirm no infection.  Also discussed xray. She is agreeable.  Eating.  No nausea or vomiting.  Bowels moving.  No fever.  Breathing stable.    Past Medical History:  Diagnosis Date  . Allergic rhinitis   . Arthritis   . Asthma   . Diverticulosis, sigmoid   . GERD (gastroesophageal reflux disease)   . History of hiatal hernia   . Hypertension 06-12-2005  . Ulcer    Past Surgical History:  Procedure Laterality Date  . BREAST BIOPSY Right 09/01/2017   Affirm Bx of 2 areas- both areas were fibroadenomatoid change   . BREAST CYST ASPIRATION Left 1990  . CARDIAC CATHETERIZATION  July 2012  . CHOLECYSTECTOMY  1991  . LIPOMA EXCISION  4/99   Left flank area  . NASAL SINUS SURGERY  July 2012  . TUBAL LIGATION  1984   Family History  Problem Relation Age of Onset  . Emphysema Father        smoked  . Asthma Father   . Lung cancer Father        smoked  . Hypertension Father   . Hypertension Mother   . Cancer Mother        oral  . Breast cancer Maternal Grandmother 45  . Heart disease Paternal Grandfather        myocardial infarction  . Stroke Maternal Grandfather    Social History   Socioeconomic History  . Marital status:  Married    Spouse name: Not on file  . Number of children: 2  . Years of education: Not on file  . Highest education level: Not on file  Occupational History  . Occupation: Works at [employer]  Tobacco Use  . Smoking status: Never Smoker  . Smokeless tobacco: Never Used  Substance and Sexual Activity  . Alcohol use: Yes    Alcohol/week: 0.0 standard drinks    Comment: occ wine with dinner  . Drug use: No  . Sexual activity: Yes  Other Topics Concern  . Not on file  Social History Narrative  . Not on file   Social Determinants of Health   Financial Resource Strain: Low Risk   . Difficulty of Paying Living Expenses: Not hard at all  Food Insecurity: No Food Insecurity  . Worried About Charity fundraiser in the Last Year: Never true  . Ran Out of Food in the Last Year: Never true  Transportation Needs: No Transportation Needs  . Lack of Transportation (Medical): No  . Lack of Transportation (Non-Medical): No  Physical Activity: Not on file  Stress: No Stress Concern Present  . Feeling of Stress : Not at all  Social Connections: Socially Integrated  . Frequency of Communication with Friends and Family: More than three times a week  . Frequency of Social Gatherings with Friends and Family: More than three times a week  . Attends Religious Services: More than 4 times per year  . Active Member of Clubs or Organizations: Yes  . Attends Archivist Meetings: More than 4 times per year  . Marital Status: Married    Outpatient Encounter Medications as of 11/07/2020  Medication Sig  . albuterol (PROVENTIL HFA;VENTOLIN HFA) 108 (90 BASE) MCG/ACT inhaler Inhale 2 puffs using inhaler every 4-6 hours as needed for SOB/wheeze.  Marland Kitchen ALPRAZolam (XANAX) 0.25 MG tablet Take 1 tablet (0.25 mg total) by mouth daily as needed for anxiety.  . budesonide (PULMICORT) 0.5 MG/2ML nebulizer solution Take 0.5 mg by nebulization 2 (two) times daily.  . budesonide-formoterol (SYMBICORT)  160-4.5 MCG/ACT inhaler Inhale 2 puffs into the lungs daily. as directed.  . celecoxib (CELEBREX) 200 MG capsule Take by mouth.  . EPINEPHrine (EPIPEN 2-PAK) 0.3 mg/0.3 mL IJ SOAJ injection Inject 0.3 mLs (0.3 mg total) into the muscle once.  . hydrocortisone (ANUSOL-HC) 25 MG suppository Place 1 suppository (25 mg total) rectally 2 (two) times daily as needed.  . Hypertonic Nasal Wash (SINUS RINSE NA) As directed three times daily  . ipratropium-albuterol (DUONEB) 0.5-2.5 (3) MG/3ML SOLN   . losartan (COZAAR) 100 MG tablet TAKE 1 TABLET BY MOUTH ONCE DAILY  . omalizumab (XOLAIR) 150 MG injection 150 mg every 30 (thirty) days.   Marland Kitchen omeprazole (PRILOSEC) 10 MG capsule TAKE 1 CAPSULE BY MOUTH ONCE DAILY  . rosuvastatin (CRESTOR) 5 MG tablet TAKE 1 TABLET BY MOUTH ON MONDAY AND THURSDAY  . [DISCONTINUED] cefdinir (OMNICEF) 300 MG capsule Take 1 capsule (300 mg total) by mouth 2 (two) times daily.   No facility-administered encounter medications on file as of 11/07/2020.    Review of Systems  Constitutional: Negative for appetite change, fatigue and unexpected weight change.  HENT: Negative for congestion.   Respiratory: Negative for cough, chest tightness and shortness of breath.   Cardiovascular: Negative for chest pain, palpitations and leg swelling.  Gastrointestinal: Negative for abdominal pain, diarrhea, nausea and vomiting.  Genitourinary: Negative for difficulty urinating and dysuria.  Musculoskeletal: Positive for back pain. Negative for joint swelling and myalgias.  Skin: Negative for color change and rash.  Neurological: Negative for dizziness, light-headedness and headaches.  Psychiatric/Behavioral: Negative for agitation and dysphoric mood.       Objective:    Physical Exam Vitals reviewed.  Constitutional:      General: She is not in acute distress. HENT:     Head: Normocephalic and atraumatic.     Right Ear: External ear normal.     Left Ear: External ear normal.      Mouth/Throat:     Mouth: Oropharynx is clear and moist.  Eyes:     General: No scleral icterus.       Right eye: No discharge.        Left eye: No discharge.     Conjunctiva/sclera: Conjunctivae normal.  Neck:     Thyroid: No thyromegaly.  Cardiovascular:     Rate and Rhythm: Normal rate and regular rhythm.  Pulmonary:     Effort: No respiratory distress.     Breath sounds: Normal breath sounds. No wheezing.  Abdominal:     General: Bowel sounds are normal.     Palpations: Abdomen is soft.     Tenderness:  There is no abdominal tenderness.  Genitourinary:    Comments: Normal external genitalia.  Vaginal vault without lesions.  KOH/wet prep obtained.  Could not appreciate any adnexal masses or tenderness.   Musculoskeletal:        General: No swelling, tenderness or edema.     Cervical back: Neck supple. No tenderness.  Lymphadenopathy:     Cervical: No cervical adenopathy.  Skin:    Findings: No erythema or rash.  Neurological:     Mental Status: She is alert.  Psychiatric:        Mood and Affect: Mood normal.        Behavior: Behavior normal.     BP 134/76 (BP Location: Left Arm, Patient Position: Sitting, Cuff Size: Normal)   Pulse 89   Temp 98.1 F (36.7 C) (Oral)   Resp 15   Ht 5\' 6"  (1.676 m)   Wt 170 lb 12.8 oz (77.5 kg)   LMP 11/03/1996   SpO2 97%   BMI 27.57 kg/m  Wt Readings from Last 3 Encounters:  11/07/20 170 lb 12.8 oz (77.5 kg)  10/24/20 174 lb (78.9 kg)  09/28/20 173 lb 9.6 oz (78.7 kg)     Lab Results  Component Value Date   WBC 3.3 (L) 09/26/2020   HGB 12.5 09/26/2020   HCT 37.5 09/26/2020   PLT 175.0 09/26/2020   GLUCOSE 99 09/26/2020   CHOL 145 09/26/2020   TRIG 98.0 09/26/2020   HDL 46.90 09/26/2020   LDLCALC 78 09/26/2020   ALT 18 09/26/2020   AST 23 09/26/2020   NA 138 09/26/2020   K 3.9 09/26/2020   CL 104 09/26/2020   CREATININE 0.76 09/26/2020   BUN 12 09/26/2020   CO2 28 09/26/2020   TSH 3.72 03/27/2020   INR 0.94  01/07/2017   HGBA1C 5.9 09/26/2020   MICROALBUR 1.2 03/27/2020    MM 3D SCREEN BREAST BILATERAL  Result Date: 10/22/2020 CLINICAL DATA:  Screening. EXAM: DIGITAL SCREENING BILATERAL MAMMOGRAM WITH TOMO AND CAD COMPARISON:  Previous exam(s). ACR Breast Density Category b: There are scattered areas of fibroglandular density. FINDINGS: There are no findings suspicious for malignancy. Images were processed with CAD. IMPRESSION: No mammographic evidence of malignancy. A result letter of this screening mammogram will be mailed directly to the patient. RECOMMENDATION: Screening mammogram in one year. (Code:SM-B-01Y) BI-RADS CATEGORY  1: Negative. Electronically Signed   By: Audie Pinto M.D.   On: 10/22/2020 16:27       Assessment & Plan:   Problem List Items Addressed This Visit    Asthma    Breathing stable.       Hypertension    Continue on losartan.  Blood pressure doing well.  Follow       Dysuria - Primary    Recently treated for UTI.  Recheck urine to confirm clear.  Symptoms improved.  Pelvic exam as outlined.  KOH/wet prep - to confirm no vaginal infection contributing to symptoms.  Follow.       Relevant Orders   POCT Urinalysis Dipstick (Completed)   Urine Culture (Completed)   Urine Microscopic Only (Completed)   Low back pain    Intermittent low back pain as outlined.  Check L-S spine xray.  May need PT.        Relevant Orders   DG Lumbar Spine 2-3 Views (Completed)    Other Visit Diagnoses    Vaginal discharge       Relevant Orders   Cervicovaginal ancillary only( [facility]) (Completed)       Einar Pheasant, MD

## 2020-11-08 ENCOUNTER — Other Ambulatory Visit: Payer: Self-pay | Admitting: Internal Medicine

## 2020-11-08 LAB — URINE CULTURE
MICRO NUMBER:: 11348370
SPECIMEN QUALITY:: ADEQUATE

## 2020-11-08 LAB — CERVICOVAGINAL ANCILLARY ONLY
Bacterial Vaginitis (gardnerella): NEGATIVE
Candida Glabrata: NEGATIVE
Candida Vaginitis: POSITIVE — AB
Comment: NEGATIVE
Comment: NEGATIVE
Comment: NEGATIVE

## 2020-11-08 MED ORDER — FLUCONAZOLE 150 MG PO TABS: ORAL_TABLET | ORAL | 0 refills | Status: DC

## 2020-11-08 NOTE — Progress Notes (Signed)
rx sent in for diflucan  

## 2020-11-13 NOTE — Telephone Encounter (Signed)
Order placed for PT referral.  

## 2020-11-14 ENCOUNTER — Encounter: Payer: Self-pay | Admitting: Internal Medicine

## 2020-11-14 NOTE — Assessment & Plan Note (Addendum)
Recently treated for UTI.  Recheck urine to confirm clear.  Symptoms improved.  Pelvic exam as outlined.  KOH/wet prep - to confirm no vaginal infection contributing to symptoms.  Follow.

## 2020-11-14 NOTE — Assessment & Plan Note (Signed)
Breathing stable.

## 2020-11-14 NOTE — Assessment & Plan Note (Signed)
Intermittent low back pain as outlined.  Check L-S spine xray.  May need PT.

## 2020-11-14 NOTE — Assessment & Plan Note (Signed)
Continue on losartan.  Blood pressure doing well.  Follow

## 2020-11-28 ENCOUNTER — Other Ambulatory Visit: Payer: Self-pay

## 2020-11-28 ENCOUNTER — Other Ambulatory Visit (INDEPENDENT_AMBULATORY_CARE_PROVIDER_SITE_OTHER): Payer: PPO

## 2020-11-28 DIAGNOSIS — D72819 Decreased white blood cell count, unspecified: Secondary | ICD-10-CM

## 2020-11-28 LAB — CBC WITH DIFFERENTIAL/PLATELET
Basophils Absolute: 0 10*3/uL (ref 0.0–0.1)
Basophils Relative: 1.2 % (ref 0.0–3.0)
Eosinophils Absolute: 0.3 10*3/uL (ref 0.0–0.7)
Eosinophils Relative: 8.4 % — ABNORMAL HIGH (ref 0.0–5.0)
HCT: 39.6 % (ref 36.0–46.0)
Hemoglobin: 13 g/dL (ref 12.0–15.0)
Lymphocytes Relative: 41 % (ref 12.0–46.0)
Lymphs Abs: 1.5 10*3/uL (ref 0.7–4.0)
MCHC: 32.8 g/dL (ref 30.0–36.0)
MCV: 88.6 fl (ref 78.0–100.0)
Monocytes Absolute: 0.4 10*3/uL (ref 0.1–1.0)
Monocytes Relative: 10 % (ref 3.0–12.0)
Neutro Abs: 1.4 10*3/uL (ref 1.4–7.7)
Neutrophils Relative %: 39.4 % — ABNORMAL LOW (ref 43.0–77.0)
Platelets: 168 10*3/uL (ref 150.0–400.0)
RBC: 4.46 Mil/uL (ref 3.87–5.11)
RDW: 13.9 % (ref 11.5–15.5)
WBC: 3.6 10*3/uL — ABNORMAL LOW (ref 4.0–10.5)

## 2020-12-10 ENCOUNTER — Other Ambulatory Visit: Payer: Self-pay | Admitting: Internal Medicine

## 2020-12-26 ENCOUNTER — Encounter: Payer: Self-pay | Admitting: Physical Therapy

## 2020-12-26 ENCOUNTER — Ambulatory Visit: Payer: PPO | Attending: Internal Medicine | Admitting: Physical Therapy

## 2020-12-26 ENCOUNTER — Other Ambulatory Visit: Payer: Self-pay

## 2020-12-26 DIAGNOSIS — M545 Low back pain, unspecified: Secondary | ICD-10-CM | POA: Insufficient documentation

## 2020-12-26 DIAGNOSIS — L570 Actinic keratosis: Secondary | ICD-10-CM | POA: Diagnosis not present

## 2020-12-26 DIAGNOSIS — R262 Difficulty in walking, not elsewhere classified: Secondary | ICD-10-CM

## 2020-12-26 DIAGNOSIS — G8929 Other chronic pain: Secondary | ICD-10-CM | POA: Insufficient documentation

## 2020-12-26 DIAGNOSIS — K130 Diseases of lips: Secondary | ICD-10-CM | POA: Diagnosis not present

## 2020-12-26 DIAGNOSIS — D485 Neoplasm of uncertain behavior of skin: Secondary | ICD-10-CM | POA: Diagnosis not present

## 2020-12-26 NOTE — Therapy (Signed)
[facility] [address] [place], [address] Phone: [phone]   Fax:  [phone]  Physical Therapy Evaluation  Patient Details  Name: Chloe Moyer MRN: [id number] Date of Birth: 02/19/1951 Referring Provider (PT): Einar Pheasant, MD   Encounter Date: 12/26/2020   PT End of Session - 12/26/20 2011    Visit Number 1    Number of Visits 24    Date for PT Re-Evaluation 03/20/21    Authorization Type [employer] reporting period from 12/26/2020    Progress Note Due on Visit 10    PT Start Time 1259    PT Stop Time 1340    PT Time Calculation (min) 41 min    Activity Tolerance Patient limited by pain;Patient tolerated treatment well    Behavior During Therapy [facility] for tasks assessed/performed           Past Medical History:  Diagnosis Date  . Allergic rhinitis   . Arthritis   . Asthma   . Diverticulosis, sigmoid   . GERD (gastroesophageal reflux disease)   . History of hiatal hernia   . Hypertension 06-12-2005  . Ulcer     Past Surgical History:  Procedure Laterality Date  . BREAST BIOPSY Right 09/01/2017   Affirm Bx of 2 areas- both areas were fibroadenomatoid change   . BREAST CYST ASPIRATION Left 1990  . CARDIAC CATHETERIZATION  July 2012  . CHOLECYSTECTOMY  1991  . LIPOMA EXCISION  4/99   Left flank area  . NASAL SINUS SURGERY  July 2012  . TUBAL LIGATION  1984    There were no vitals filed for this visit.    Subjective Assessment - 12/26/20 1306    Subjective Patient reports she has a spot on her R lower back near the SIJ. It will "tighten up" and causes her to take "baby steps." She can be shopping and it suddenly hits and she can hardly walk. This also happens when she gets out of the care. She told Dr. Nicki Reaper about it and she recommended PT. She had a radiograph and states she was told it is likely from arthritis. She also has some pain in her right scapular region if she is  standing for a long period like baking a cake. She thinks the low back pain started up again in the last 3 months but it used to bother her years ago and her doctor told her never to let anyone operate on it. She went to PT at that time and someone put something on it with heat and it hasn't bothered her for years. This was about 20-30 years ago. Pain started with no apparent reason or injury. It has been staying the same. It seems to happen once every 2 weeks. Lasts at home briefly only 30 min with the massage gun. If she is in town she has to make her way home and then 30-60 min with the massage gun to improve. Ran down leg one time to the right posterior thigh above the knee. Denies numbness or tingling. Hurts when turning in bed pushing up with left elbow laying on left side. Has had to have two bone grafts and implant- insert in her mouth and was taking motrin and tylenol every 4-5 hours for 4 days about 3 weeks ago and that eliminated her back problems. PA: general housework, gardening, etc.    Pertinent History Patient is a 70 y.o. female who presents to outpatient physical therapy  with a referral for medical diagnosis Right-sided low back pain without sciatica, unspecified chronicity. This patient's chief complaints consist of right sided intermittent low back pain with single instance of radiation to the R posterior thigh with onset in the last 3 months leading to the following functional deficits: disrupts shopping, errands, anything she is doing out and about, difficulty straightening up, delays activities at home Relevant past medical history and comorbidities include chronic allergic asthma (epi pen in car), acute sinusitis (two surgeries), skin spot removed, hypertension, dental issues, reflux, hx of cardiac catheterization to prove she doesn't have CHF (heart was healthy), cholecystectomy, osteoarthritis, hx L knee pain (baker's cyst/meniscus).  Patient denies hx of cancer, stroke, seizures, lung  problem (besides asthma), major cardiac events, diabetes, unexplained weight loss, changes in bowel or bladder problems, new onset stumbling or dropping things, low back surgeryor bladder problems, new onset stumbling or dropping things, low back surgery.    Limitations Lifting;Walking;House hold activities;Other (comment)   Functional Limitations: disrupts shopping, errands, anything she is doing out and about, difficulty straightening up, delays activities at home   Diagnostic tests Radiograph report 11/07/2020: "FINDINGS:  Frontal, lateral, and spot lumbosacral lateral images were obtained.  There are 5 non-rib-bearing lumbar type vertebral bodies. 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 There is thoracolumbar levoscoliosis.  There is no fracture or spondylolisthesis. There is moderately  severe disc space narrowing at L3-4 and L4-5 with milder disc space  narrowing at L5-S1. There is facet osteoarthritic change at L5-S1  bilaterally and at L4-5 on the right. IMPRESSION:  Scoliosis with osteoarthritic change at several levels. No fracture  or spondylolisthesis."    Patient Stated Goals "to get to where she is pain free all the time" and "to get limbered up"    Currently in Pain? No/denies    Pain Score 0-No pain   W: 7/10; B: 0/10   Pain Location Back    Pain Orientation Right;Lower    Pain Descriptors / Indicators Other (Comment)   "it hits and the whole area tightens up"   Pain Type Chronic pain    Pain Radiating Towards one time radiated to right posterior thigh above the knee    Pain Onset More than a month ago    Pain Frequency Intermittent    Aggravating Factors  moving a certain way when shopping, getting out of car.    Pain Relieving Factors massage gun, hot water (tub), heated mattress pad, tylenol/motrin taken for something else.    Effect of Pain on Daily Activities Functional Limitations: disrupts shopping, errands, anything she is doing out and about, difficulty straightening up. Delays  activities at home              [facility] PT Assessment - 12/26/20 1311      Assessment   Medical Diagnosis Right-sided low back pain without sciatica, unspecified chronicity    Referring Provider (PT) Einar Pheasant, MD    Onset Date/Surgical Date 09/25/20    Next MD Visit march 2022    Prior Therapy previous episdode for low back 30 years ago with good results      Precautions   Precautions None      Restrictions   Weight Bearing Restrictions No      Balance Screen   Has the patient fallen in the past 6 months No    Has the patient had a decrease in activity level because of a fear of falling?  No    Is the patient reluctant to  leave their home because of a fear of falling?  No      Home Environment   Living Environment --   no concerns about getting around home     Prior Function   Level of Independence Independent    Leisure running chasing frisbee, playing with grandson on beach, gardening, walking, shopping, running, walking.      Cognition   Overall Cognitive Status Within Functional Limits for tasks assessed      Observation/Other Assessments   Focus on Therapeutic Outcomes (FOTO)  59           OBJECTIVE  OBSERVATION/INSPECTION  . Posture: lumbar spine slightly shifted to right, increased thoracic kyphosis. Mild genuvarus bilaterally.  . Tremor: none . Muscle bulk: no gross abnormalities . Bed mobility: supine <> sit and rolling WFL. . Transfers: sit <> stand mod I, slow getting out of chair.  . Gait: grossly WFL for household and short community ambulation. More detailed gait analysis deferred to later date as needed.  . Stairs   NEUROLOGICAL Dermatomes  . L2-S2 appears equal and intact to light touch. Myotomes . L2-S2 appears intact Neurodynamic Tests   . Slump positive for concordant pull at right low back with R leg.  Marland Kitchen SLR negative bilaterally  SPINE MOTION Lumbar AROM *Indicates pain  - Flexion: = fingers to toes, no pain, normal for  patient. - Extension: = 50% no pain - Rotation: R = WFL, L = concordant pain with overpressure - Side Flexion: R = 50% no pain, L = 50% concordant pulling in right  PERIPHERAL JOINT MOTION (in degrees) Passive Range of Motion (PROM) Comments: B LE WFL  MUSCLE PERFORMANCE (MMT):  *Indicates pain 12/26/20 Date Date  Joint/Motion R/L R/L R/L  Hip     Flexion 4/4 / /  Extension (knee ext) 4+*/4+ / /  Flexion (knee flex) / / /  Abduction 5/4+ / /  Comments:  12/26/2020: knee/ankle 5/5. Able to heel and toe walk. Concordant pain with R hip extension.   ACCESSORY MOTION:  - Mild concordant discomfort with R UPA at base of spine  PALPATION: - reproduction of pain at base of spine on right paraspinals near L5  EDUCATION/COGNITION: Patient is alert and oriented X 4.   Objective measurements completed on examination: See above findings.      PT Education - 12/26/20 2010    Education Details examination purpose/form.  Education on diagnosis, prognosis, POC, anatomy and physiology of current condition. review of radiographs within scope of practice and knowledge    Person(s) Educated Patient    Methods Explanation;Demonstration    Comprehension Verbalized understanding;Returned demonstration;Need further instruction            PT Short Term Goals - 12/26/20 1959      PT SHORT TERM GOAL #1   Title Be independent with initial home exercise program for self-management of symptoms.    Baseline initial HEP to be established at visit 2 as appropriate (12/26/2020);    Time 3    Period Weeks    Status New    Target Date 01/16/21             PT Long Term Goals - 12/26/20 1958      PT LONG TERM GOAL #1   Title Be independent with a long-term home exercise program for self-management of symptoms.    Baseline initial HEP to be established at visit 2 as appropriate (12/26/2020);    Time 12    Period  Weeks    Status New   TARGET DATE FOR ALL LONG TERM GOALS: 03/20/2021     PT LONG TERM  GOAL #2   Title Demonstrate improved FOTO score to equal or greater than 69 to demonstrate improvement in overall condition and self-reported functional ability.    Baseline 59 (12/26/2020);    Time 12    Period Weeks    Status New      PT LONG TERM GOAL #3   Title Have full lumbar spine AROM with no compensations or increase in pain in all planes except intermittent end range discomfort to allow patient to complete valued activities with less difficulty.    Baseline painful and limited - see objective exam (12/26/2020);    Time 12    Period Weeks    Status New      PT LONG TERM GOAL #4   Title Reduce pain with functional activities to equal or less than 1/10 to allow patient to complete usual activities including ADLs, IADLs, and social engagement with less difficulty.    Baseline 7/10 (12/26/2020);    Time 12    Period Weeks    Status New      PT LONG TERM GOAL #5   Title Complete community, work and/or recreational activities without limitation due to current condition.    Baseline limiting ability to complete her usual activities including shopping, errands, anything she is doing out and about, straightening up, activities at home without difficulty (12/26/2020);    Time 12    Period Weeks    Status New                  Plan - 12/26/20 2014    Clinical Impression Statement Patient is a 70 y.o. female referred to outpatient physical therapy with a medical diagnosis of Right-sided low back pain without sciatica, unspecified chronicity who presents with signs and symptoms consistent with intermittent right sided low back pain with radiation to the right posterior thigh above the knee with onset in the last 3 months. Patient presents with significant pain, ROM, posture, muscle performance (endurance/strength/power), muscle tension, activity tolerance, joint stiffness impairments that are limiting ability to complete her usual activities including shopping, errands, anything she is  doing out and about, straightening up, activities at home without difficulty. Patient will benefit from skilled physical therapy intervention to address current body structure impairments and activity limitations to improve function and work towards goals set in current POC in order to return to prior level of function or maximal functional improvement.    Personal Factors and Comorbidities Age;Comorbidity 3+;Time since onset of injury/illness/exacerbation;Past/Current Experience;Fitness    Comorbidities Relevant past medical history and comorbidities include chronic allergic asthma (epi pen in car), acute sinusitis (two surgeries), skin spot removed, hypertension, dental issues, reflux, hx of cardiac catheterization to prove she doesn't have CHF (heart was healthy), cholecystectomy, osteoarthritis, hx L knee pain (baker's cyst/meniscus).    Examination-Activity Limitations Bend;Locomotion Level;Transfers;Lift;Stand;Carry;Squat;Stairs    Examination-Participation Restrictions Community Activity;Interpersonal Relationship;Laundry;Shop;Driving;[employer] Work   usual activities including shopping, errands, anything she is doing out and about, straightening up, activities at home without difficulty.   Stability/Clinical Decision Making Evolving/Moderate complexity    Clinical Decision Making Moderate    Rehab Potential Good    PT Frequency 2x / week    PT Duration 12 weeks    PT Treatment/Interventions ADLs/Self Care Home Management;Aquatic Therapy;Cryotherapy;Moist Heat;Electrical Stimulation;Therapeutic activities;Gait training;Therapeutic exercise;Neuromuscular re-education;Patient/family education;Passive range of motion;Dry needling;Spinal Manipulations;Joint Manipulations;Manual techniques  PT Next Visit Plan establish baseline HEP, manual therapy, trunk strengthening, hip strengthening    PT Home Exercise Plan to be established at [facility] as appropriate    Consulted and Agree with Plan of Care Patient            Patient will benefit from skilled therapeutic intervention in order to improve the following deficits and impairments:  Pain,Decreased coordination,Decreased mobility,Increased muscle spasms,Postural dysfunction,Decreased activity tolerance,Decreased endurance,Decreased range of motion,Decreased strength,Hypomobility,Impaired perceived functional ability,Difficulty walking  Visit Diagnosis: Chronic right-sided low back pain, unspecified whether sciatica present  Difficulty in walking, not elsewhere classified     Problem List Patient Active Problem List   Diagnosis Date Noted  . Low back pain 11/07/2020  . Dysuria 10/24/2020  . Pain, dental 09/29/2020  . Hypercholesterolemia 04/08/2020  . Left knee pain 04/08/2020  . COVID-19 virus infection 10/31/2019  . Stress 09/25/2019  . Joint stiffness of hand 06/26/2018  . Plantar fasciitis 06/26/2018  . Left leg pain 02/21/2018  . Hyperglycemia 02/21/2018  . Visual disturbance 02/20/2016  . Near syncope 02/20/2016  . Right lower quadrant pain 08/04/2015  . Abnormal liver function tests 08/04/2015  . UTI symptoms 03/31/2015  . Health care maintenance 02/03/2015  . Cough 12/21/2014  . History of colonic polyps 05/29/2014  . Leukopenia 07/07/2013  . Asthma 10/10/2012  . Allergic rhinitis 10/10/2012  . GERD (gastroesophageal reflux disease) 10/10/2012  . Hypertension 10/10/2012    Everlean Alstrom. Graylon Good, PT, DPT 12/26/20, 8:22 PM  [facility] [address] [place], [address] Phone: [phone]   Fax:  [phone]  Name: Chloe Moyer MRN: [id number] Date of Birth: 12-26-1950

## 2021-01-01 ENCOUNTER — Ambulatory Visit: Payer: PPO | Admitting: Physical Therapy

## 2021-01-01 ENCOUNTER — Other Ambulatory Visit: Payer: Self-pay

## 2021-01-01 ENCOUNTER — Encounter: Payer: Self-pay | Admitting: Physical Therapy

## 2021-01-01 DIAGNOSIS — M545 Low back pain, unspecified: Secondary | ICD-10-CM | POA: Diagnosis not present

## 2021-01-01 DIAGNOSIS — R262 Difficulty in walking, not elsewhere classified: Secondary | ICD-10-CM

## 2021-01-01 DIAGNOSIS — G8929 Other chronic pain: Secondary | ICD-10-CM

## 2021-01-01 NOTE — Therapy (Signed)
[facility] [address], [place], [address] Phone: [phone]   Fax:  [phone]  Physical Therapy Treatment  Patient Details  Name: Chloe Moyer MRN: [id number] Date of Birth: Jun 12, 1951 Referring Provider (PT): Einar Pheasant, MD   Encounter Date: 01/01/2021   PT End of Session - 01/01/21 1121    Visit Number 2    Number of Visits 24    Date for PT Re-Evaluation 03/20/21    Authorization Type [employer] reporting period from 12/26/2020    Progress Note Due on Visit 10    PT Start Time 1115    PT Stop Time 1155    PT Time Calculation (min) 40 min    Activity Tolerance Patient limited by pain;Patient tolerated treatment well    Behavior During Therapy [facility] for tasks assessed/performed           Past Medical History:  Diagnosis Date  . Allergic rhinitis   . Arthritis   . Asthma   . Diverticulosis, sigmoid   . GERD (gastroesophageal reflux disease)   . History of hiatal hernia   . Hypertension 06-12-2005  . Ulcer     Past Surgical History:  Procedure Laterality Date  . BREAST BIOPSY Right 09/01/2017   Affirm Bx of 2 areas- both areas were fibroadenomatoid change   . BREAST CYST ASPIRATION Left 1990  . CARDIAC CATHETERIZATION  July 2012  . CHOLECYSTECTOMY  1991  . LIPOMA EXCISION  4/99   Left flank area  . NASAL SINUS SURGERY  July 2012  . TUBAL LIGATION  1984    There were no vitals filed for this visit.   Subjective Assessment - 01/01/21 1119    Subjective Patient reports she was a bit sore across her back after initial evaluation. She rates her pain 2/10 currently at the lower right side when she stood up from waiting room.    Pertinent History Patient is a 70 y.o. female who presents to outpatient physical therapy with a referral for medical diagnosis Right-sided low back pain without sciatica, unspecified chronicity. This patient's chief complaints consist of right sided  intermittent low back pain with single instance of radiation to the R posterior thigh with onset in the last 3 months leading to the following functional deficits: disrupts shopping, errands, anything she is doing out and about, difficulty straightening up, delays activities at home Relevant past medical history and comorbidities include chronic allergic asthma (epi pen in car), acute sinusitis (two surgeries), skin spot removed, hypertension, dental issues, reflux, hx of cardiac catheterization to prove she doesn't have CHF (heart was healthy), cholecystectomy, osteoarthritis, hx L knee pain (baker's cyst/meniscus).  Patient denies hx of cancer, stroke, seizures, lung problem (besides asthma), major cardiac events, diabetes, unexplained weight loss, changes in bowel or bladder problems, new onset stumbling or dropping things, low back surgeryor bladder problems, new onset stumbling or dropping things, low back surgery.    Limitations Lifting;Walking;House hold activities;Other (comment)   Functional Limitations: disrupts shopping, errands, anything she is doing out and about, difficulty straightening up, delays activities at home   Diagnostic tests Radiograph report 11/07/2020: "FINDINGS:  Frontal, lateral, and spot lumbosacral lateral images were obtained.  There are 5 non-rib-bearing lumbar type vertebral bodies. 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 There is thoracolumbar levoscoliosis.  There is no fracture or spondylolisthesis. There is moderately  severe disc space narrowing at L3-4 and L4-5 with milder disc space  narrowing at L5-S1. There is  facet osteoarthritic change at L5-S1  bilaterally and at L4-5 on the right. IMPRESSION:  Scoliosis with osteoarthritic change at several levels. No fracture  or spondylolisthesis."    Patient Stated Goals "to get to where she is pain free all the time" and "to get limbered up"    Currently in Pain? Yes    Pain Score 2            TREATMENT:  Therapeutic  exercise: to centralize symptoms and improve ROM, strength, muscular endurance, and activity tolerance required for successful completion of functional activities. - NuStep level 1 using bilateral upper and lower extremities. Seat/handle setting 8.  For improved extremity mobility, muscular endurance, and activity tolerance; and to induce the analgesic effect of aerobic exercise, stimulate improved joint nutrition, and prepare body structures and systems for following interventions. x 5  minutes. Average SPM - 75  (manual therapy/dry needling - see below).   - hooklying double knees to chest, 2x10  - hooklying low trunk rotation, 2x10 each direction  - Education on HEP including handout   Manual therapy: to reduce pain and tissue tension, improve range of motion, neuromodulation, in order to promote improved ability to complete functional activities. Prone position:  - STM to R Lumbar paraspinals at lowest levels of lumbar spine - R UPA at approx L4-L5, grade III-IV to decreased pain and improve motion.   Modality: (unbilled) Dry needling performed to right lumbar spine to decrease pain and spasms along patient's right lumbar region with patient in prone utilizing (1) dry needle(s) .17mm x 23mm with (2) sticks at the right lumbar multifidi at L4, L5 region . Patient educated about the risks and benefits from therapy and verbally consents to treatment.  Dry needling performed by Everlean Alstrom. Graylon Good PT, DPT who is certified in this technique.  Pt required multimodal cuing for proper technique and to facilitate improved neuromuscular control, strength, range of motion, and functional ability resulting in improved performance and form.  HOME EXERCISE PROGRAM Access Code: [email or web address]  URL: [email or web address] Date: 01/01/2021 Prepared by: Rosita Kea  Exercises Supine Double Knee to Chest - 1 x daily - 2 sets - 10 reps - 5 seconds hold Supine Lower Trunk Rotation - 1 x daily - 2 sets -  10 reps - 1-5 seconds hold    PT Education - 01/01/21 1120    Education Details Exercise purpose/form. Self management techniques. HEP including handout    Person(s) Educated Patient    Methods Explanation;Demonstration;Tactile cues;Verbal cues    Comprehension Verbalized understanding;Returned demonstration;Verbal cues required;Tactile cues required;Need further instruction            PT Short Term Goals - 12/26/20 1959      PT SHORT TERM GOAL #1   Title Be independent with initial home exercise program for self-management of symptoms.    Baseline initial HEP to be established at visit 2 as appropriate (12/26/2020);    Time 3    Period Weeks    Status New    Target Date 01/16/21             PT Long Term Goals - 12/26/20 1958      PT LONG TERM GOAL #1   Title Be independent with a long-term home exercise program for self-management of symptoms.    Baseline initial HEP to be established at visit 2 as appropriate (12/26/2020);    Time 12    Period Weeks    Status New   TARGET DATE FOR ALL LONG  TERM GOALS: 03/20/2021     PT LONG TERM GOAL #2   Title Demonstrate improved FOTO score to equal or greater than 69 to demonstrate improvement in overall condition and self-reported functional ability.    Baseline 59 (12/26/2020);    Time 12    Period Weeks    Status New      PT LONG TERM GOAL #3   Title Have full lumbar spine AROM with no compensations or increase in pain in all planes except intermittent end range discomfort to allow patient to complete valued activities with less difficulty.    Baseline painful and limited - see objective exam (12/26/2020);    Time 12    Period Weeks    Status New      PT LONG TERM GOAL #4   Title Reduce pain with functional activities to equal or less than 1/10 to allow patient to complete usual activities including ADLs, IADLs, and social engagement with less difficulty.    Baseline 7/10 (12/26/2020);    Time 12    Period Weeks    Status New       PT LONG TERM GOAL #5   Title Complete community, work and/or recreational activities without limitation due to current condition.    Baseline limiting ability to complete her usual activities including shopping, errands, anything she is doing out and about, straightening up, activities at home without difficulty (12/26/2020);    Time 12    Period Weeks    Status New                 Plan - 01/01/21 1201    Clinical Impression Statement Patient tolerated treatment with some soreness in the familiar region. Reported feeling a little sore there following dry needling. Will continue to monitor response at next session. Very tight there with manual therapy. May consider trial of extension based exercises next session if no further improvement. Patient would benefit from continued management of limiting condition by skilled physical therapist to address remaining impairments and functional limitations to work towards stated goals and return to PLOF or maximal functional independence.    Personal Factors and Comorbidities Age;Comorbidity 3+;Time since onset of injury/illness/exacerbation;Past/Current Experience;Fitness    Comorbidities Relevant past medical history and comorbidities include chronic allergic asthma (epi pen in car), acute sinusitis (two surgeries), skin spot removed, hypertension, dental issues, reflux, hx of cardiac catheterization to prove she doesn't have CHF (heart was healthy), cholecystectomy, osteoarthritis, hx L knee pain (baker's cyst/meniscus).    Examination-Activity Limitations Bend;Locomotion Level;Transfers;Lift;Stand;Carry;Squat;Stairs    Examination-Participation Restrictions Community Activity;Interpersonal Relationship;Laundry;Shop;Driving;[employer] Work   usual activities including shopping, errands, anything she is doing out and about, straightening up, activities at home without difficulty.   Stability/Clinical Decision Making Evolving/Moderate complexity    Rehab  Potential Good    PT Frequency 2x / week    PT Duration 12 weeks    PT Treatment/Interventions ADLs/Self Care Home Management;Aquatic Therapy;Cryotherapy;Moist Heat;Electrical Stimulation;Therapeutic activities;Gait training;Therapeutic exercise;Neuromuscular re-education;Patient/family education;Passive range of motion;Dry needling;Spinal Manipulations;Joint Manipulations;Manual techniques    PT Next Visit Plan update HEP, manual therapy, trunk strengthening, hip strengthening    PT Home Exercise Plan Medbridge Access Code: [email or web address]    Consulted and Agree with Plan of Care Patient           Patient will benefit from skilled therapeutic intervention in order to improve the following deficits and impairments:  Pain,Decreased coordination,Decreased mobility,Increased muscle spasms,Postural dysfunction,Decreased activity tolerance,Decreased endurance,Decreased range of motion,Decreased strength,Hypomobility,Impaired perceived functional ability,Difficulty walking  Visit Diagnosis:  Chronic right-sided low back pain, unspecified whether sciatica present  Difficulty in walking, not elsewhere classified     Problem List Patient Active Problem List   Diagnosis Date Noted  . Low back pain 11/07/2020  . Dysuria 10/24/2020  . Pain, dental 09/29/2020  . Hypercholesterolemia 04/08/2020  . Left knee pain 04/08/2020  . COVID-19 virus infection 10/31/2019  . Stress 09/25/2019  . Joint stiffness of hand 06/26/2018  . Plantar fasciitis 06/26/2018  . Left leg pain 02/21/2018  . Hyperglycemia 02/21/2018  . Visual disturbance 02/20/2016  . Near syncope 02/20/2016  . Right lower quadrant pain 08/04/2015  . Abnormal liver function tests 08/04/2015  . UTI symptoms 03/31/2015  . Health care maintenance 02/03/2015  . Cough 12/21/2014  . History of colonic polyps 05/29/2014  . Leukopenia 07/07/2013  . Asthma 10/10/2012  . Allergic rhinitis 10/10/2012  . GERD (gastroesophageal reflux disease)  10/10/2012  . Hypertension 10/10/2012   Everlean Alstrom. Graylon Good, PT, DPT 01/01/21, 12:04 PM  [facility] [address], [place], [address] Phone: [phone]   Fax:  [phone]  Name: ALEXSA FLAUM MRN: [id number] Date of Birth: 1951-07-25

## 2021-01-03 ENCOUNTER — Ambulatory Visit: Payer: PPO

## 2021-01-03 ENCOUNTER — Encounter: Payer: Self-pay | Admitting: Physical Therapy

## 2021-01-03 ENCOUNTER — Other Ambulatory Visit: Payer: Self-pay

## 2021-01-03 DIAGNOSIS — R262 Difficulty in walking, not elsewhere classified: Secondary | ICD-10-CM

## 2021-01-03 DIAGNOSIS — M545 Low back pain, unspecified: Secondary | ICD-10-CM

## 2021-01-03 NOTE — Therapy (Signed)
[facility] [address], [place], [address] Phone: [phone]   Fax:  [phone]  Physical Therapy Treatment  Patient Details  Name: Chloe Moyer MRN: [id number] Date of Birth: 1951-07-01 Referring Provider (PT): Einar Pheasant, MD   Encounter Date: 01/03/2021   PT End of Session - 01/03/21 1550    Visit Number 3    Number of Visits 24    Date for PT Re-Evaluation 03/20/21    Authorization Type [employer] reporting period from 12/26/2020    PT Start Time 6644    PT Stop Time 1640    PT Time Calculation (min) 45 min    Activity Tolerance Patient limited by pain;Patient tolerated treatment well    Behavior During Therapy [facility] for tasks assessed/performed           Past Medical History:  Diagnosis Date  . Allergic rhinitis   . Arthritis   . Asthma   . Diverticulosis, sigmoid   . GERD (gastroesophageal reflux disease)   . History of hiatal hernia   . Hypertension 06-12-2005  . Ulcer     Past Surgical History:  Procedure Laterality Date  . BREAST BIOPSY Right 09/01/2017   Affirm Bx of 2 areas- both areas were fibroadenomatoid change   . BREAST CYST ASPIRATION Left 1990  . CARDIAC CATHETERIZATION  July 2012  . CHOLECYSTECTOMY  1991  . LIPOMA EXCISION  4/99   Left flank area  . NASAL SINUS SURGERY  July 2012  . TUBAL LIGATION  1984    There were no vitals filed for this visit.   Subjective Assessment - 01/03/21 1556    Subjective Patient reported that she had a lot of muscle soreness after last visit. Was able to take a few tylenol and that helped. Pt denied pain at start of session. Pt stated that she felt that dry needling was helpful and that it felt good while she was recieving TDN.    Pertinent History Patient is a 70 y.o. female who presents to outpatient physical therapy with a referral for medical diagnosis Right-sided low back pain without sciatica, unspecified chronicity.  This patient's chief complaints consist of right sided intermittent low back pain with single instance of radiation to the R posterior thigh with onset in the last 3 months leading to the following functional deficits: disrupts shopping, errands, anything she is doing out and about, difficulty straightening up, delays activities at home Relevant past medical history and comorbidities include chronic allergic asthma (epi pen in car), acute sinusitis (two surgeries), skin spot removed, hypertension, dental issues, reflux, hx of cardiac catheterization to prove she doesn't have CHF (heart was healthy), cholecystectomy, osteoarthritis, hx L knee pain (baker's cyst/meniscus).  Patient denies hx of cancer, stroke, seizures, lung problem (besides asthma), major cardiac events, diabetes, unexplained weight loss, changes in bowel or bladder problems, new onset stumbling or dropping things, low back surgeryor bladder problems, new onset stumbling or dropping things, low back surgery.    Limitations Lifting;Walking;House hold activities;Other (comment)   functional limitations   Diagnostic tests Radiograph report 11/07/2020: "FINDINGS:  Frontal, lateral, and spot lumbosacral lateral images were obtained.  There are 5 non-rib-bearing lumbar type vertebral bodies. 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 There is thoracolumbar levoscoliosis.  There is no fracture or spondylolisthesis. There is moderately  severe disc space narrowing at L3-4 and L4-5 with milder disc space  narrowing at L5-S1. There is facet osteoarthritic change at L5-S1  bilaterally  and at L4-5 on the right. IMPRESSION:  Scoliosis with osteoarthritic change at several levels. No fracture  or spondylolisthesis."    Currently in Pain? No/denies           TREATMENT:    Therapeutic exercise: to centralize symptoms and improve ROM, strength, muscular endurance, and activity tolerance required for successful completion of functional activities.   - NuStep  level 1 using bilateral upper and lower extremities. Seat/handle setting 8.  For improved extremity mobility, muscular endurance, and activity tolerance; and to induce the analgesic effect of aerobic exercise, stimulate improved joint nutrition, and prepare body structures and systems for following interventions. x 5  minutes. Average SPM - 75      - hooklying double knees to chest, 2x10 with 5 second holds  - hooklying low trunk rotation, 2x10 each direction    -Hip abduction with RTB 2x10 with abdominal bracing  -abdominal bracing x10 with 5 second holds in supine  Supine marching with abdominal bracing 2x10   -bridges  x10 with abdominal bracing   -SLR with abdominal bracing x10 with cueing for step by step core and breath activity   -QL stretch 2x30seconds    Manual therapy: to reduce pain and tissue tension, improve range of motion, neuromodulation, in order to promote improved ability to complete functional activities.    Prone position:    - STM to R glute med/glute max/piriformis   Pt required multimodal cuing for proper technique and to facilitate improved neuromuscular control, strength, range of motion, and functional ability resulting in improved performance and form.       HOME EXERCISE PROGRAM   Access Code: [email or web address]    URL: [email or web address]   Date: 01/01/2021   Prepared by: Rosita Kea       Exercises   Supine Double Knee to Chest - 1 x daily - 2 sets - 10 reps - 5 seconds hold   Supine Lower Trunk Rotation - 1 x daily - 2 sets - 10 reps - 1-5 seconds hold    pt response/clinical impression: Pt with notable tenderness and tension to R glute med/max fibers, reported concordant pain. Pt able to perform exercises without exacerbation of pain, did report muscle work. Pt reported feeling better at end of session. The patient would benefit from further skilled PT intervention to continue to progress towards goals.       PT Education -  01/03/21 1549    Education Details therex form/technique    Person(s) Educated Patient    Methods Explanation;Demonstration;Tactile cues;Verbal cues    Comprehension Verbalized understanding;Returned demonstration;Verbal cues required;Tactile cues required;Need further instruction            PT Short Term Goals - 12/26/20 1959      PT SHORT TERM GOAL #1   Title Be independent with initial home exercise program for self-management of symptoms.    Baseline initial HEP to be established at visit 2 as appropriate (12/26/2020);    Time 3    Period Weeks    Status New    Target Date 01/16/21             PT Long Term Goals - 12/26/20 1958      PT LONG TERM GOAL #1   Title Be independent with a long-term home exercise program for self-management of symptoms.    Baseline initial HEP to be established at visit 2 as appropriate (12/26/2020);    Time 12    Period Weeks    Status New  TARGET DATE FOR ALL LONG TERM GOALS: 03/20/2021     PT LONG TERM GOAL #2   Title Demonstrate improved FOTO score to equal or greater than 69 to demonstrate improvement in overall condition and self-reported functional ability.    Baseline 59 (12/26/2020);    Time 12    Period Weeks    Status New      PT LONG TERM GOAL #3   Title Have full lumbar spine AROM with no compensations or increase in pain in all planes except intermittent end range discomfort to allow patient to complete valued activities with less difficulty.    Baseline painful and limited - see objective exam (12/26/2020);    Time 12    Period Weeks    Status New      PT LONG TERM GOAL #4   Title Reduce pain with functional activities to equal or less than 1/10 to allow patient to complete usual activities including ADLs, IADLs, and social engagement with less difficulty.    Baseline 7/10 (12/26/2020);    Time 12    Period Weeks    Status New      PT LONG TERM GOAL #5   Title Complete community, work and/or recreational activities without  limitation due to current condition.    Baseline limiting ability to complete her usual activities including shopping, errands, anything she is doing out and about, straightening up, activities at home without difficulty (12/26/2020);    Time 12    Period Weeks    Status New                 Plan - 01/03/21 1550    Clinical Impression Statement Pt with notable tenderness and tension to R glute med/max fibers, reported concordant pain. Pt able to perform exercises without exacerbation of pain, did report muscle work. Pt reported feeling better at end of session. The patient would benefit from further skilled PT intervention to continue to progress towards goals.    Personal Factors and Comorbidities Age;Comorbidity 3+;Time since onset of injury/illness/exacerbation;Past/Current Experience;Fitness    Comorbidities Relevant past medical history and comorbidities include chronic allergic asthma (epi pen in car), acute sinusitis (two surgeries), skin spot removed, hypertension, dental issues, reflux, hx of cardiac catheterization to prove she doesn't have CHF (heart was healthy), cholecystectomy, osteoarthritis, hx L knee pain (baker's cyst/meniscus).    Examination-Activity Limitations Bend;Locomotion Level;Transfers;Lift;Stand;Carry;Squat;Stairs    Examination-Participation Restrictions Community Activity;Interpersonal Relationship;Laundry;Shop;Driving;[employer] Work   usual activities including shopping, errands, anything she is doing out and about, straightening up, activities at home without difficulty.   Stability/Clinical Decision Making Evolving/Moderate complexity    Rehab Potential Good    PT Frequency 2x / week    PT Duration 12 weeks    PT Treatment/Interventions ADLs/Self Care Home Management;Aquatic Therapy;Cryotherapy;Moist Heat;Electrical Stimulation;Therapeutic activities;Gait training;Therapeutic exercise;Neuromuscular re-education;Patient/family education;Passive range of motion;Dry  needling;Spinal Manipulations;Joint Manipulations;Manual techniques    PT Next Visit Plan update HEP, manual therapy, trunk strengthening, hip strengthening    PT Home Exercise Plan Medbridge Access Code: [email or web address]    Consulted and Agree with Plan of Care Patient           Patient will benefit from skilled therapeutic intervention in order to improve the following deficits and impairments:  Pain,Decreased coordination,Decreased mobility,Increased muscle spasms,Postural dysfunction,Decreased activity tolerance,Decreased endurance,Decreased range of motion,Decreased strength,Hypomobility,Impaired perceived functional ability,Difficulty walking  Visit Diagnosis: Chronic right-sided low back pain, unspecified whether sciatica present  Difficulty in walking, not elsewhere classified     Problem List Patient Active  Problem List   Diagnosis Date Noted  . Low back pain 11/07/2020  . Dysuria 10/24/2020  . Pain, dental 09/29/2020  . Hypercholesterolemia 04/08/2020  . Left knee pain 04/08/2020  . COVID-19 virus infection 10/31/2019  . Stress 09/25/2019  . Joint stiffness of hand 06/26/2018  . Plantar fasciitis 06/26/2018  . Left leg pain 02/21/2018  . Hyperglycemia 02/21/2018  . Visual disturbance 02/20/2016  . Near syncope 02/20/2016  . Right lower quadrant pain 08/04/2015  . Abnormal liver function tests 08/04/2015  . UTI symptoms 03/31/2015  . Health care maintenance 02/03/2015  . Cough 12/21/2014  . History of colonic polyps 05/29/2014  . Leukopenia 07/07/2013  . Asthma 10/10/2012  . Allergic rhinitis 10/10/2012  . GERD (gastroesophageal reflux disease) 10/10/2012  . Hypertension 10/10/2012    Lieutenant Diego PT, DPT 4:58 PM,01/03/21   [facility] [address] [place], [address] Phone: [phone]   Fax:  [phone]  Name: Chloe Moyer MRN: [id number] Date of Birth: 1951-08-17

## 2021-01-08 ENCOUNTER — Encounter: Payer: PPO | Admitting: Physical Therapy

## 2021-01-09 ENCOUNTER — Ambulatory Visit: Payer: PPO

## 2021-01-09 ENCOUNTER — Other Ambulatory Visit: Payer: Self-pay

## 2021-01-09 DIAGNOSIS — R262 Difficulty in walking, not elsewhere classified: Secondary | ICD-10-CM

## 2021-01-09 DIAGNOSIS — M545 Low back pain, unspecified: Secondary | ICD-10-CM

## 2021-01-09 NOTE — Therapy (Signed)
[facility] [address] [place], [address] Phone: [phone]   Fax:  [phone]  Physical Therapy Treatment  Patient Details  Name: Chloe Moyer MRN: [id number] Date of Birth: 1951-08-30 Referring Provider (PT): Einar Pheasant, MD   Encounter Date: 01/09/2021   PT End of Session - 01/09/21 1035    Visit Number 4    Number of Visits 24    Date for PT Re-Evaluation 03/20/21    Authorization Type [employer] reporting period from 12/26/2020    PT Start Time 1031    PT Stop Time 1114    PT Time Calculation (min) 43 min    Activity Tolerance Patient limited by pain;Patient tolerated treatment well    Behavior During Therapy [facility] for tasks assessed/performed           Past Medical History:  Diagnosis Date  . Allergic rhinitis   . Arthritis   . Asthma   . Diverticulosis, sigmoid   . GERD (gastroesophageal reflux disease)   . History of hiatal hernia   . Hypertension 06-12-2005  . Ulcer     Past Surgical History:  Procedure Laterality Date  . BREAST BIOPSY Right 09/01/2017   Affirm Bx of 2 areas- both areas were fibroadenomatoid change   . BREAST CYST ASPIRATION Left 1990  . CARDIAC CATHETERIZATION  July 2012  . CHOLECYSTECTOMY  1991  . LIPOMA EXCISION  4/99   Left flank area  . NASAL SINUS SURGERY  July 2012  . TUBAL LIGATION  1984    There were no vitals filed for this visit.   Subjective Assessment - 01/09/21 1032    Subjective Patient reported more soreness after last visit. Had trouble getting out of bed due to the soreness. Did have some increased pain that has resolved by today. Did stated she exercised yesterday morning, and also did a lot of shopping so she was sore and hurting by the end of the day.    Pertinent History Patient is a 70 y.o. female who presents to outpatient physical therapy with a referral for medical diagnosis Right-sided low back pain without sciatica,  unspecified chronicity. This patient's chief complaints consist of right sided intermittent low back pain with single instance of radiation to the R posterior thigh with onset in the last 3 months leading to the following functional deficits: disrupts shopping, errands, anything she is doing out and about, difficulty straightening up, delays activities at home Relevant past medical history and comorbidities include chronic allergic asthma (epi pen in car), acute sinusitis (two surgeries), skin spot removed, hypertension, dental issues, reflux, hx of cardiac catheterization to prove she doesn't have CHF (heart was healthy), cholecystectomy, osteoarthritis, hx L knee pain (baker's cyst/meniscus).  Patient denies hx of cancer, stroke, seizures, lung problem (besides asthma), major cardiac events, diabetes, unexplained weight loss, changes in bowel or bladder problems, new onset stumbling or dropping things, low back surgeryor bladder problems, new onset stumbling or dropping things, low back surgery.    Limitations Lifting;Walking;House hold activities;Other (comment)    Diagnostic tests Radiograph report 11/07/2020: "FINDINGS:  Frontal, lateral, and spot lumbosacral lateral images were obtained.  There are 5 non-rib-bearing lumbar type vertebral bodies. 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 There is thoracolumbar levoscoliosis.  There is no fracture or spondylolisthesis. There is moderately  severe disc space narrowing at L3-4 and L4-5 with milder disc space  narrowing at L5-S1. There is facet osteoarthritic change at L5-S1  bilaterally and  at L4-5 on the right. IMPRESSION:  Scoliosis with osteoarthritic change at several levels. No fracture  or spondylolisthesis."    Patient Stated Goals "to get to where she is pain free all the time" and "to get limbered up"    Currently in Pain? No/denies   reported 1/10 soreness          TREATMENT:     Therapeutic exercise: to centralize symptoms and improve ROM,  strength, muscular endurance, and activity tolerance required for successful completion of functional activities.    - NuStep level 1 using bilateral upper and lower extremities. Seat/handle setting 8.  For improved extremity mobility, muscular endurance, and activity tolerance; and to induce the analgesic effect of aerobic exercise, stimulate improved joint nutrition, and prepare body structures and systems for following interventions. x 5  minutes. Average SPM - 75     - hooklying double knees to chest, 2x10 with 5 second holds   - hooklying low trunk rotation, 2x10 each direction     -Hip abduction with RTB 2x10 with abdominal bracing   -abdominal bracing x10 with 5 second holds in supine   Supine marching with abdominal bracing 2x10    -bridges  x10 with abdominal bracing    -SLR with abdominal bracing x10 with cueing for step by step core and breath activity  (deferred this session, to be performed next session)  Piriformis stretch on RLE 2x30secs  -QL stretch BLE 2x30seconds       Pt required multimodal cuing for proper technique and to facilitate improved neuromuscular control, strength, range of motion, and functional ability resulting in improved performance and form.    Manual therapy: to reduce pain and tissue tension, improve range of motion, neuromodulation, in order to promote improved ability to complete functional activities.     Prone position:    - STM to R glute med/glute max/piriformis superficial and deep techniques       HOME EXERCISE PROGRAM   Access Code: [email or web address]     URL: [email or web address]    Date: 01/01/2021    Prepared by: Rosita Kea    Exercises   Supine Double Knee to Chest - 1 x daily - 2 sets - 10 reps - 5 seconds hold    Supine Lower Trunk Rotation - 1 x daily - 2 sets - 10 reps - 1-5 seconds hold        pt response/clinical impression: Pt with decreased pain/discomfort this session compared to previous session with  exercises. Pt still needed cueing to maintain abdominal activation with exercises to maximize core strength. Pt also with less tenderness with manual therapy as well. The patient would benefit from further skilled PT intervention to maximize function, strength and improve QOL.      PT Education - 01/09/21 1034    Education Details therex form/technique    Person(s) Educated Patient    Methods Explanation;Demonstration;Tactile cues;Verbal cues    Comprehension Verbalized understanding;Returned demonstration;Verbal cues required;Tactile cues required;Need further instruction            PT Short Term Goals - 12/26/20 1959      PT SHORT TERM GOAL #1   Title Be independent with initial home exercise program for self-management of symptoms.    Baseline initial HEP to be established at visit 2 as appropriate (12/26/2020);    Time 3    Period Weeks    Status New    Target Date 01/16/21  PT Long Term Goals - 12/26/20 1958      PT LONG TERM GOAL #1   Title Be independent with a long-term home exercise program for self-management of symptoms.    Baseline initial HEP to be established at visit 2 as appropriate (12/26/2020);    Time 12    Period Weeks    Status New   TARGET DATE FOR ALL LONG TERM GOALS: 03/20/2021     PT LONG TERM GOAL #2   Title Demonstrate improved FOTO score to equal or greater than 69 to demonstrate improvement in overall condition and self-reported functional ability.    Baseline 59 (12/26/2020);    Time 12    Period Weeks    Status New      PT LONG TERM GOAL #3   Title Have full lumbar spine AROM with no compensations or increase in pain in all planes except intermittent end range discomfort to allow patient to complete valued activities with less difficulty.    Baseline painful and limited - see objective exam (12/26/2020);    Time 12    Period Weeks    Status New      PT LONG TERM GOAL #4   Title Reduce pain with functional activities to equal or  less than 1/10 to allow patient to complete usual activities including ADLs, IADLs, and social engagement with less difficulty.    Baseline 7/10 (12/26/2020);    Time 12    Period Weeks    Status New      PT LONG TERM GOAL #5   Title Complete community, work and/or recreational activities without limitation due to current condition.    Baseline limiting ability to complete her usual activities including shopping, errands, anything she is doing out and about, straightening up, activities at home without difficulty (12/26/2020);    Time 12    Period Weeks    Status New                 Plan - 01/09/21 1035    Clinical Impression Statement Pt with decreased pain/discomfort this session compared to previous session with exercises. Pt still needed cueing to maintain abdominal activation with exercises to maximize core strength. Pt also with less tenderness with manual therapy as well. The patient would benefit from further skilled PT intervention to maximize function, strength and improve QOL.    Personal Factors and Comorbidities Age;Comorbidity 3+;Time since onset of injury/illness/exacerbation;Past/Current Experience;Fitness    Comorbidities Relevant past medical history and comorbidities include chronic allergic asthma (epi pen in car), acute sinusitis (two surgeries), skin spot removed, hypertension, dental issues, reflux, hx of cardiac catheterization to prove she doesn't have CHF (heart was healthy), cholecystectomy, osteoarthritis, hx L knee pain (baker's cyst/meniscus).    Examination-Activity Limitations Bend;Locomotion Level;Transfers;Lift;Stand;Carry;Squat;Stairs    Examination-Participation Restrictions Community Activity;Interpersonal Relationship;Laundry;Shop;Driving;[employer] Work   usual activities including shopping, errands, anything she is doing out and about, straightening up, activities at home without difficulty.   Stability/Clinical Decision Making Evolving/Moderate complexity     Rehab Potential Good    PT Frequency 2x / week    PT Duration 12 weeks    PT Treatment/Interventions ADLs/Self Care Home Management;Aquatic Therapy;Cryotherapy;Moist Heat;Electrical Stimulation;Therapeutic activities;Gait training;Therapeutic exercise;Neuromuscular re-education;Patient/family education;Passive range of motion;Dry needling;Spinal Manipulations;Joint Manipulations;Manual techniques    PT Next Visit Plan update HEP, manual therapy, trunk strengthening, hip strengthening    PT Home Exercise Plan Medbridge Access Code: [email or web address]    Consulted and Agree with Plan of Care Patient  Patient will benefit from skilled therapeutic intervention in order to improve the following deficits and impairments:  Pain,Decreased coordination,Decreased mobility,Increased muscle spasms,Postural dysfunction,Decreased activity tolerance,Decreased endurance,Decreased range of motion,Decreased strength,Hypomobility,Impaired perceived functional ability,Difficulty walking  Visit Diagnosis: Chronic right-sided low back pain, unspecified whether sciatica present  Difficulty in walking, not elsewhere classified     Problem List Patient Active Problem List   Diagnosis Date Noted  . Low back pain 11/07/2020  . Dysuria 10/24/2020  . Pain, dental 09/29/2020  . Hypercholesterolemia 04/08/2020  . Left knee pain 04/08/2020  . COVID-19 virus infection 10/31/2019  . Stress 09/25/2019  . Joint stiffness of hand 06/26/2018  . Plantar fasciitis 06/26/2018  . Left leg pain 02/21/2018  . Hyperglycemia 02/21/2018  . Visual disturbance 02/20/2016  . Near syncope 02/20/2016  . Right lower quadrant pain 08/04/2015  . Abnormal liver function tests 08/04/2015  . UTI symptoms 03/31/2015  . Health care maintenance 02/03/2015  . Cough 12/21/2014  . History of colonic polyps 05/29/2014  . Leukopenia 07/07/2013  . Asthma 10/10/2012  . Allergic rhinitis 10/10/2012  . GERD (gastroesophageal reflux  disease) 10/10/2012  . Hypertension 10/10/2012    Lieutenant Diego PT, DPT 11:28 AM,01/09/21   [facility] [address], [place], [address] Phone: [phone]   Fax:  [phone]  Name: JAZARIAH TEALL MRN: [id number] Date of Birth: 1951-06-12

## 2021-01-10 ENCOUNTER — Encounter: Payer: PPO | Admitting: Physical Therapy

## 2021-01-15 ENCOUNTER — Other Ambulatory Visit: Payer: Self-pay

## 2021-01-15 ENCOUNTER — Encounter: Payer: Self-pay | Admitting: Physical Therapy

## 2021-01-15 ENCOUNTER — Ambulatory Visit: Payer: PPO | Attending: Internal Medicine | Admitting: Physical Therapy

## 2021-01-15 DIAGNOSIS — R262 Difficulty in walking, not elsewhere classified: Secondary | ICD-10-CM

## 2021-01-15 DIAGNOSIS — M545 Low back pain, unspecified: Secondary | ICD-10-CM | POA: Diagnosis not present

## 2021-01-15 DIAGNOSIS — G8929 Other chronic pain: Secondary | ICD-10-CM | POA: Diagnosis not present

## 2021-01-15 NOTE — Therapy (Signed)
[facility] [address] [place], [address] Phone: [phone]   Fax:  [phone]  Physical Therapy Treatment  Patient Details  Name: Chloe Moyer MRN: [id number] Date of Birth: May 09, 1951 Referring Provider (PT): Einar Pheasant, MD   Encounter Date: 01/15/2021   PT End of Session - 01/15/21 1435    Visit Number 5    Number of Visits 24    Date for PT Re-Evaluation 03/20/21    Authorization Type [employer] reporting period from 12/26/2020    Progress Note Due on Visit 10    PT Start Time 0160    PT Stop Time 1512    PT Time Calculation (min) 38 min    Activity Tolerance Patient limited by pain;Patient tolerated treatment well    Behavior During Therapy [facility] for tasks assessed/performed           Past Medical History:  Diagnosis Date  . Allergic rhinitis   . Arthritis   . Asthma   . Diverticulosis, sigmoid   . GERD (gastroesophageal reflux disease)   . History of hiatal hernia   . Hypertension 06-12-2005  . Ulcer     Past Surgical History:  Procedure Laterality Date  . BREAST BIOPSY Right 09/01/2017   Affirm Bx of 2 areas- both areas were fibroadenomatoid change   . BREAST CYST ASPIRATION Left 1990  . CARDIAC CATHETERIZATION  July 2012  . CHOLECYSTECTOMY  1991  . LIPOMA EXCISION  4/99   Left flank area  . NASAL SINUS SURGERY  July 2012  . TUBAL LIGATION  1984    There were no vitals filed for this visit.   Subjective Assessment - 01/15/21 1612    Subjective Patient reports she has been doing her exercises and feeling really good until she sat on a stool for prolonged period to clean out her pantry and then put down mulch. She did not have pain until the last few minutes of putting down the mulch. She currently has pain at the right lower lumbar region.    Pertinent History Patient is a 70 y.o. female who presents to outpatient physical therapy with a referral for medical  diagnosis Right-sided low back pain without sciatica, unspecified chronicity. This patient's chief complaints consist of right sided intermittent low back pain with single instance of radiation to the R posterior thigh with onset in the last 3 months leading to the following functional deficits: disrupts shopping, errands, anything she is doing out and about, difficulty straightening up, delays activities at home Relevant past medical history and comorbidities include chronic allergic asthma (epi pen in car), acute sinusitis (two surgeries), skin spot removed, hypertension, dental issues, reflux, hx of cardiac catheterization to prove she doesn't have CHF (heart was healthy), cholecystectomy, osteoarthritis, hx L knee pain (baker's cyst/meniscus).  Patient denies hx of cancer, stroke, seizures, lung problem (besides asthma), major cardiac events, diabetes, unexplained weight loss, changes in bowel or bladder problems, new onset stumbling or dropping things, low back surgeryor bladder problems, new onset stumbling or dropping things, low back surgery.    Limitations Lifting;Walking;House hold activities;Other (comment)    Diagnostic tests Radiograph report 11/07/2020: "FINDINGS:  Frontal, lateral, and spot lumbosacral lateral images were obtained.  There are 5 non-rib-bearing lumbar type vertebral bodies. 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 There is thoracolumbar levoscoliosis.  There is no fracture or spondylolisthesis. There is moderately  severe disc space narrowing at L3-4 and L4-5 with milder disc space  narrowing at L5-S1. There is facet osteoarthritic change at L5-S1  bilaterally and at L4-5 on the right. IMPRESSION:  Scoliosis with osteoarthritic change at several levels. No fracture  or spondylolisthesis."    Patient Stated Goals "to get to where she is pain free all the time" and "to get limbered up"    Currently in Pain? Yes    Pain Score 2            TREATMENT:     Therapeutic exercise: to  centralize symptoms and improve ROM, strength, muscular endurance, and activity tolerance required for successful completion of functional activities.    - NuStep level 2 using bilateral upper and lower extremities. Seat/handle setting 8.  For improved extremity mobility, muscular endurance, and activity tolerance; and to induce the analgesic effect of aerobic exercise, stimulate improved joint nutrition, and prepare body structures and systems for following interventions. x 5  minutes. Average SPM - 70    (manual/dry needling - see below)  - QL stretch BLE 2x30seconds - Piriformis stretch on RLE 2x30secs - hooklying double knees to chest, 1x10 with 5 second holds  -bridges  x10 with abdominal bracing      Pt required multimodal cuing for proper technique and to facilitate improved neuromuscular control, strength, range of motion, and functional ability resulting in improved performance and form.   Manual therapy: to reduce pain and tissue tension, improve range of motion, neuromodulation, in order to promote improved ability to complete functional activities. Prone position:    - STM to R glute med/glute max/piriformis  superficial and deep techniques   - STM to R Lumbar paraspinals at lowest levels of lumbar spine - R UPA at approx L4-L5, grade III-IV to decreased pain and improve motion (no improvement in pain)  Modality: (unbilled) Dry needling performed to right lumbar spine to decrease pain and spasms along patient's right lumbar region with patient in prone utilizing (1) dry needle(s) .30mm x 64mm with (3) sticks at the right lumbar multifidi at L4, L5 region . Patient educated about the risks and benefits from therapy and verbally consents to treatment. Dry needling performed by Everlean Alstrom. Graylon Good PT, DPT who is certified in this technique.  HOME EXERCISE PROGRAM   Access Code: [email or web address]    URL: [email or web address]   Date: 01/01/2021    Prepared by: Rosita Kea     Exercises   Supine Double Knee to Chest - 1 x daily - 2 sets - 10 reps - 5 seconds hold   Supine Lower Trunk Rotation - 1 x daily - 2 sets - 10 reps - 1-5 seconds hold      PT Education - 01/15/21 1512    Education Details therex form/technique, dry needling    Person(s) Educated Patient    Methods Explanation;Demonstration;Tactile cues;Verbal cues    Comprehension Verbalized understanding;Returned demonstration;Verbal cues required;Tactile cues required;Need further instruction            PT Short Term Goals - 01/15/21 1514      PT SHORT TERM GOAL #1   Title Be independent with initial home exercise program for self-management of symptoms.    Baseline initial HEP to be established at visit 2 as appropriate (12/26/2020);    Time 3    Period Weeks    Status Achieved    Target Date 01/16/21             PT Long Term Goals - 12/26/20 1958      PT LONG TERM GOAL #1  Title Be independent with a long-term home exercise program for self-management of symptoms.    Baseline initial HEP to be established at visit 2 as appropriate (12/26/2020);    Time 12    Period Weeks    Status New   TARGET DATE FOR ALL LONG TERM GOALS: 03/20/2021     PT LONG TERM GOAL #2   Title Demonstrate improved FOTO score to equal or greater than 69 to demonstrate improvement in overall condition and self-reported functional ability.    Baseline 59 (12/26/2020);    Time 12    Period Weeks    Status New      PT LONG TERM GOAL #3   Title Have full lumbar spine AROM with no compensations or increase in pain in all planes except intermittent end range discomfort to allow patient to complete valued activities with less difficulty.    Baseline painful and limited - see objective exam (12/26/2020);    Time 12    Period Weeks    Status New      PT LONG TERM GOAL #4   Title Reduce pain with functional activities to equal or less than 1/10 to allow patient to complete usual activities including ADLs, IADLs, and  social engagement with less difficulty.    Baseline 7/10 (12/26/2020);    Time 12    Period Weeks    Status New      PT LONG TERM GOAL #5   Title Complete community, work and/or recreational activities without limitation due to current condition.    Baseline limiting ability to complete her usual activities including shopping, errands, anything she is doing out and about, straightening up, activities at home without difficulty (12/26/2020);    Time 12    Period Weeks    Status New                 Plan - 01/15/21 1514    Clinical Impression Statement Patient presents with increased pain today at R lower lumbar region. Tightness noted in  R glute but not tender. Provided dry needling this session with mild increase in soreness at first but better overall since coming in. Patient would benefit from continued management of limiting condition by skilled physical therapist to address remaining impairments and functional limitations to work towards stated goals and return to PLOF or maximal functional independence.    Personal Factors and Comorbidities Age;Comorbidity 3+;Time since onset of injury/illness/exacerbation;Past/Current Experience;Fitness    Comorbidities Relevant past medical history and comorbidities include chronic allergic asthma (epi pen in car), acute sinusitis (two surgeries), skin spot removed, hypertension, dental issues, reflux, hx of cardiac catheterization to prove she doesn't have CHF (heart was healthy), cholecystectomy, osteoarthritis, hx L knee pain (baker's cyst/meniscus).    Examination-Activity Limitations Bend;Locomotion Level;Transfers;Lift;Stand;Carry;Squat;Stairs    Examination-Participation Restrictions Community Activity;Interpersonal Relationship;Laundry;Shop;Driving;[employer] Work   usual activities including shopping, errands, anything she is doing out and about, straightening up, activities at home without difficulty.   Stability/Clinical Decision Making  Evolving/Moderate complexity    Rehab Potential Good    PT Frequency 2x / week    PT Duration 12 weeks    PT Treatment/Interventions ADLs/Self Care Home Management;Aquatic Therapy;Cryotherapy;Moist Heat;Electrical Stimulation;Therapeutic activities;Gait training;Therapeutic exercise;Neuromuscular re-education;Patient/family education;Passive range of motion;Dry needling;Spinal Manipulations;Joint Manipulations;Manual techniques    PT Next Visit Plan update HEP, manual therapy, trunk strengthening, hip strengthening    PT Home Exercise Plan Medbridge Access Code: [email or web address]    Consulted and Agree with Plan of Care Patient  Patient will benefit from skilled therapeutic intervention in order to improve the following deficits and impairments:  Pain,Decreased coordination,Decreased mobility,Increased muscle spasms,Postural dysfunction,Decreased activity tolerance,Decreased endurance,Decreased range of motion,Decreased strength,Hypomobility,Impaired perceived functional ability,Difficulty walking  Visit Diagnosis: Chronic right-sided low back pain, unspecified whether sciatica present  Difficulty in walking, not elsewhere classified     Problem List Patient Active Problem List   Diagnosis Date Noted  . Low back pain 11/07/2020  . Dysuria 10/24/2020  . Pain, dental 09/29/2020  . Hypercholesterolemia 04/08/2020  . Left knee pain 04/08/2020  . COVID-19 virus infection 10/31/2019  . Stress 09/25/2019  . Joint stiffness of hand 06/26/2018  . Plantar fasciitis 06/26/2018  . Left leg pain 02/21/2018  . Hyperglycemia 02/21/2018  . Visual disturbance 02/20/2016  . Near syncope 02/20/2016  . Right lower quadrant pain 08/04/2015  . Abnormal liver function tests 08/04/2015  . UTI symptoms 03/31/2015  . Health care maintenance 02/03/2015  . Cough 12/21/2014  . History of colonic polyps 05/29/2014  . Leukopenia 07/07/2013  . Asthma 10/10/2012  . Allergic rhinitis 10/10/2012  . GERD  (gastroesophageal reflux disease) 10/10/2012  . Hypertension 10/10/2012    Everlean Alstrom. Graylon Good, PT, DPT 01/15/21, 4:45 PM  [facility] [address] [place], [address] Phone: [phone]   Fax:  [phone]  Name: JANAI MAUDLIN MRN: [id number] Date of Birth: Sep 02, 1951

## 2021-01-17 ENCOUNTER — Encounter: Payer: Self-pay | Admitting: Physical Therapy

## 2021-01-17 ENCOUNTER — Other Ambulatory Visit: Payer: Self-pay

## 2021-01-17 ENCOUNTER — Ambulatory Visit: Payer: PPO | Admitting: Physical Therapy

## 2021-01-17 DIAGNOSIS — G8929 Other chronic pain: Secondary | ICD-10-CM

## 2021-01-17 DIAGNOSIS — M545 Low back pain, unspecified: Secondary | ICD-10-CM | POA: Diagnosis not present

## 2021-01-17 DIAGNOSIS — R262 Difficulty in walking, not elsewhere classified: Secondary | ICD-10-CM

## 2021-01-17 NOTE — Therapy (Signed)
[place] [facility] [address] [place], [address] Phone: [phone]   Fax:  [phone]  Physical Therapy Treatment  Patient Details  Name: Chloe Moyer MRN: [id number] Date of Birth: [birth year] Referring Provider (Chloe Moyer): Chloe Pheasant, MD   Encounter Date: 01/17/2021   Chloe Moyer End of Session - 01/17/21 1600    Visit Number 6    Number of Visits 24    Date for Chloe Moyer Re-Evaluation 03/20/21    Authorization Type [employer] reporting period from 12/26/2020    Progress Note Due on Visit 10    Chloe Moyer Start Time 1517    Chloe Moyer Stop Time 1600    Chloe Moyer Time Calculation (min) 43 min    Activity Tolerance Patient limited by pain;Patient tolerated treatment well    Behavior During Therapy [facility] for tasks assessed/performed           Past Medical History:  Diagnosis Date  . Allergic rhinitis   . Arthritis   . Asthma   . Diverticulosis, sigmoid   . GERD (gastroesophageal reflux disease)   . History of hiatal hernia   . Hypertension 06-12-2005  . Ulcer     Past Surgical History:  Procedure Laterality Date  . BREAST BIOPSY Right 09/01/2017   Affirm Bx of 2 areas- both areas were fibroadenomatoid change   . BREAST CYST ASPIRATION Left 1990  . CARDIAC CATHETERIZATION  July 2012  . CHOLECYSTECTOMY  1991  . LIPOMA EXCISION  4/99   Left flank area  . NASAL SINUS SURGERY  July 2012  . TUBAL LIGATION  1984    There were no vitals filed for this visit.   Subjective Assessment - 01/17/21 1520    Subjective Patient reports she is feeling well. States she was sore until she went to bed but it did not disrupt her sleep and she felt much better in the morning. Was able to sit on bleachers for 4 hours last night which is usually really hard and painful but last night it went really well. She feels the dry needling is really helping her. She is going to start walking with some of her freinds tonight and she is excited about it.     Pertinent History Patient is a 70 y.o. female who presents to outpatient physical therapy with a referral for medical diagnosis Right-sided low back pain without sciatica, unspecified chronicity. This patient's chief complaints consist of right sided intermittent low back pain with single instance of radiation to the R posterior thigh with onset in the last 3 months leading to the following functional deficits: disrupts shopping, errands, anything she is doing out and about, difficulty straightening up, delays activities at home Relevant past medical history and comorbidities include chronic allergic asthma (epi pen in car), acute sinusitis (two surgeries), skin spot removed, hypertension, dental issues, reflux, hx of cardiac catheterization to prove she doesn't have CHF (heart was healthy), cholecystectomy, osteoarthritis, hx L knee pain (baker's cyst/meniscus).  Patient denies hx of cancer, stroke, seizures, lung problem (besides asthma), major cardiac events, diabetes, unexplained weight loss, changes in bowel or bladder problems, new onset stumbling or dropping things, low back surgeryor bladder problems, new onset stumbling or dropping things, low back surgery.    Limitations Lifting;Walking;House hold activities;Other (comment)    Diagnostic tests Radiograph report 11/07/2020: "FINDINGS:  Frontal, lateral, and spot lumbosacral lateral images were obtained.  There are 5 non-rib-bearing lumbar type vertebral bodies. 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 There is  thoracolumbar levoscoliosis.  There is no fracture or spondylolisthesis. There is moderately  severe disc space narrowing at L3-4 and L4-5 with milder disc space  narrowing at L5-S1. There is facet osteoarthritic change at L5-S1  bilaterally and at L4-5 on the right. IMPRESSION:  Scoliosis with osteoarthritic change at several levels. No fracture  or spondylolisthesis."    Patient Stated Goals "to get to where she is pain free all the time" and "to get  limbered up"    Currently in Pain? No/denies           TREATMENT:   Therapeutic exercise: to centralize symptoms and improve ROM, strength, muscular endurance, and activity tolerance required for successful completion of functional activities.   - NuStep level 2 using bilateral upper and lower extremities. Seat/handle setting 8. For improved extremity mobility, muscular endurance, and activity tolerance; and to induce the analgesic effect of aerobic exercise, stimulate improved joint nutrition, and prepare body structures and systems for following interventions. x 5 minutes. Average SPM - 83   (manual/dry needling - see below)  - prone abdominal brace with slight hip extension alternating sides (multifidus kick), 1x10 each side (concordant R low back pulling/discomfort with R hip extension)  - QL stretchBLE2x30seconds - Piriformisstretch on RLE2x30secs - hooklying double knees to chest, 1x10 with 5 second holds  - bridges x10 with abdominal bracing    Chloe Moyer required multimodal cuing for proper technique and to facilitate improved neuromuscular control, strength, range of motion, and functional ability resulting in improved performance and form.   Manual therapy: to reduce pain and tissue tension, improve range of motion, neuromodulation, in order to promote improved ability to complete functional activities. Prone position:  - STM to R glute med/glute max/piriformis superficial and deep techniques - STM to R Lumbar paraspinals at lowest levels of lumbar spine - R CPA and  UPA with and without KE wedge at approx L4-L5, grade III-IV to decreased pain and improve motion (no improvement in pain)  Modality: (unbilled) Dry needling performed toright lumbar spineto decrease pain and spasms along patient'sright lumbarregion with patient in proneutilizing (1) dry needle(s) .85mm x20mm with (3) sticks atthe right lumbar multifidi at L4, L5 region. Patient educated  about the risks and benefits from therapy and verbally consents to treatment. Dry needling performed by Chloe Moyer Chloe Moyer, Chloe Moyer who is certified in this technique.  HOME EXERCISE PROGRAM  Access Code: [email or web address] URL: [email or web address] Date: 01/17/2021 Prepared by: Rosita Kea  Exercises Supine Double Knee to Chest - 1 x daily - 2 sets - 10 reps - 5 seconds hold Supine Lower Trunk Rotation - 1 x daily - 2 sets - 10 reps - 1-5 seconds hold Bridge Supine Quadratus Lumborum Stretch    Chloe Moyer Education - 01/17/21 1559    Education Details therex form/technique, dry needling    Person(s) Educated Patient    Methods Explanation;Demonstration;Tactile cues;Verbal cues    Comprehension Verbalized understanding;Returned demonstration;Verbal cues required;Tactile cues required;Need further instruction            Chloe Moyer Short Term Goals - 01/15/21 1514      Chloe Moyer SHORT TERM GOAL #1   Title Be independent with initial home exercise program for self-management of symptoms.    Baseline initial HEP to be established at visit 2 as appropriate (12/26/2020);    Time 3    Period Weeks    Status Achieved    Target Date 01/16/21             Chloe Moyer Long  Term Goals - 12/26/20 1958      Chloe Moyer LONG TERM GOAL #1   Title Be independent with a long-term home exercise program for self-management of symptoms.    Baseline initial HEP to be established at visit 2 as appropriate (12/26/2020);    Time 12    Period Weeks    Status New   TARGET DATE FOR ALL LONG TERM GOALS: 03/20/2021     Chloe Moyer LONG TERM GOAL #2   Title Demonstrate improved FOTO score to equal or greater than 69 to demonstrate improvement in overall condition and self-reported functional ability.    Baseline 59 (12/26/2020);    Time 12    Period Weeks    Status New      Chloe Moyer LONG TERM GOAL #3   Title Have full lumbar spine AROM with no compensations or increase in pain in all planes except intermittent end range discomfort to allow patient to  complete valued activities with less difficulty.    Baseline painful and limited - see objective exam (12/26/2020);    Time 12    Period Weeks    Status New      Chloe Moyer LONG TERM GOAL #4   Title Reduce pain with functional activities to equal or less than 1/10 to allow patient to complete usual activities including ADLs, IADLs, and social engagement with less difficulty.    Baseline 7/10 (12/26/2020);    Time 12    Period Weeks    Status New      Chloe Moyer LONG TERM GOAL #5   Title Complete community, work and/or recreational activities without limitation due to current condition.    Baseline limiting ability to complete her usual activities including shopping, errands, anything she is doing out and about, straightening up, activities at home without difficulty (12/26/2020);    Time 12    Period Weeks    Status New                 Plan - 01/17/21 1613    Clinical Impression Statement Patient tolerated treatment well and reported she felt Moyer at end of session. Continues to be tender at the right lower lumbar levels and glute but is much improved after dry needling last session. Initiated some exercises specifically for multifidus and glute with some discomfort during so limited reps. Patient would benefit from continued management of limiting condition by skilled physical therapist to address remaining impairments and functional limitations to work towards stated goals and return to PLOF or maximal functional independence.    Personal Factors and Comorbidities Age;Comorbidity 3+;Time since onset of injury/illness/exacerbation;Past/Current Experience;Fitness    Comorbidities Relevant past medical history and comorbidities include chronic allergic asthma (epi pen in car), acute sinusitis (two surgeries), skin spot removed, hypertension, dental issues, reflux, hx of cardiac catheterization to prove she doesn't have CHF (heart was healthy), cholecystectomy, osteoarthritis, hx L knee pain (baker's  cyst/meniscus).    Examination-Activity Limitations Bend;Locomotion Level;Transfers;Lift;Stand;Carry;Squat;Stairs    Examination-Participation Restrictions Community Activity;Interpersonal Relationship;Laundry;Shop;Driving;[employer] Work   usual activities including shopping, errands, anything she is doing out and about, straightening up, activities at home without difficulty.   Stability/Clinical Decision Making Evolving/Moderate complexity    Rehab Potential Moyer    Chloe Moyer Frequency 2x / week    Chloe Moyer Duration 12 weeks    Chloe Moyer Treatment/Interventions ADLs/Self Care Home Management;Aquatic Therapy;Cryotherapy;Moist Heat;Electrical Stimulation;Therapeutic activities;Gait training;Therapeutic exercise;Neuromuscular re-education;Patient/family education;Passive range of motion;Dry needling;Spinal Manipulations;Joint Manipulations;Manual techniques    Chloe Moyer Next Visit Plan update HEP, manual therapy, trunk strengthening, hip  strengthening    Chloe Moyer Home Exercise Plan Medbridge Access Code: [email or web address]    Consulted and Agree with Plan of Care Patient           Patient will benefit from skilled therapeutic intervention in order to improve the following deficits and impairments:  Pain,Decreased coordination,Decreased mobility,Increased muscle spasms,Postural dysfunction,Decreased activity tolerance,Decreased endurance,Decreased range of motion,Decreased strength,Hypomobility,Impaired perceived functional ability,Difficulty walking  Visit Diagnosis: Chronic right-sided low back pain, unspecified whether sciatica present  Difficulty in walking, not elsewhere classified     Problem List Patient Active Problem List   Diagnosis Date Noted  . Low back pain 11/07/2020  . Dysuria 10/24/2020  . Pain, dental 09/29/2020  . Hypercholesterolemia 04/08/2020  . Left knee pain 04/08/2020  . COVID-19 virus infection 10/31/2019  . Stress 09/25/2019  . Joint stiffness of hand 06/26/2018  . Plantar fasciitis 06/26/2018  . Left  leg pain 02/21/2018  . Hyperglycemia 02/21/2018  . Visual disturbance 02/20/2016  . Near syncope 02/20/2016  . Right lower quadrant pain 08/04/2015  . Abnormal liver function tests 08/04/2015  . UTI symptoms 03/31/2015  . Health care maintenance 02/03/2015  . Cough 12/21/2014  . History of colonic polyps 05/29/2014  . Leukopenia 07/07/2013  . Asthma 10/10/2012  . Allergic rhinitis 10/10/2012  . GERD (gastroesophageal reflux disease) 10/10/2012  . Hypertension 10/10/2012   Chloe Moyer, Chloe Moyer, Chloe Moyer 01/17/21, 4:16 PM  [place] [facility] [address] [place], [address] Phone: [phone]   Fax:  [phone]  Name: AMARIYA LISKEY MRN: [id number] Date of Birth: 13-Jul-1951

## 2021-01-22 ENCOUNTER — Ambulatory Visit: Payer: PPO | Admitting: Physical Therapy

## 2021-01-22 ENCOUNTER — Encounter: Payer: Self-pay | Admitting: Physical Therapy

## 2021-01-22 ENCOUNTER — Other Ambulatory Visit: Payer: Self-pay

## 2021-01-22 DIAGNOSIS — M545 Low back pain, unspecified: Secondary | ICD-10-CM

## 2021-01-22 DIAGNOSIS — R262 Difficulty in walking, not elsewhere classified: Secondary | ICD-10-CM

## 2021-01-22 DIAGNOSIS — G8929 Other chronic pain: Secondary | ICD-10-CM

## 2021-01-22 NOTE — Therapy (Signed)
[place] [facility] [address] [place], [address] Phone: [phone]   Fax:  [phone]  Physical Therapy Treatment  Patient Details  Name: Chloe Moyer MRN: [id number] Date of Birth: 07/30/1951 Referring Provider (PT): Einar Pheasant, MD   Encounter Date: 01/22/2021   PT End of Session - 01/22/21 1351    Visit Number 7    Number of Visits 24    Date for PT Re-Evaluation 03/20/21    Authorization Type [employer] reporting period from 12/26/2020    Progress Note Due on Visit 10    PT Start Time 1345    PT Stop Time 1425    PT Time Calculation (min) 40 min    Activity Tolerance Patient limited by pain;Patient tolerated treatment well    Behavior During Therapy [facility] for tasks assessed/performed           Past Medical History:  Diagnosis Date  . Allergic rhinitis   . Arthritis   . Asthma   . Diverticulosis, sigmoid   . GERD (gastroesophageal reflux disease)   . History of hiatal hernia   . Hypertension 06-12-2005  . Ulcer     Past Surgical History:  Procedure Laterality Date  . BREAST BIOPSY Right 09/01/2017   Affirm Bx of 2 areas- both areas were fibroadenomatoid change   . BREAST CYST ASPIRATION Left 1990  . CARDIAC CATHETERIZATION  July 2012  . CHOLECYSTECTOMY  1991  . LIPOMA EXCISION  4/99   Left flank area  . NASAL SINUS SURGERY  July 2012  . TUBAL LIGATION  1984    There were no vitals filed for this visit.   Subjective Assessment - 01/22/21 1346    Subjective Patient reports she is doing well today with no pain upon arrival. States she was very sore in her low back and right glute that she attributes to dry needling .Felt better the next day. Reports HEP is going well.    Pertinent History Patient is a 70 y.o. female who presents to outpatient physical therapy with a referral for medical diagnosis Right-sided low back pain without sciatica, unspecified chronicity. This patient's  chief complaints consist of right sided intermittent low back pain with single instance of radiation to the R posterior thigh with onset in the last 3 months leading to the following functional deficits: disrupts shopping, errands, anything she is doing out and about, difficulty straightening up, delays activities at home Relevant past medical history and comorbidities include chronic allergic asthma (epi pen in car), acute sinusitis (two surgeries), skin spot removed, hypertension, dental issues, reflux, hx of cardiac catheterization to prove she doesn't have CHF (heart was healthy), cholecystectomy, osteoarthritis, hx L knee pain (baker's cyst/meniscus).  Patient denies hx of cancer, stroke, seizures, lung problem (besides asthma), major cardiac events, diabetes, unexplained weight loss, changes in bowel or bladder problems, new onset stumbling or dropping things, low back surgeryor bladder problems, new onset stumbling or dropping things, low back surgery.    Limitations Lifting;Walking;House hold activities;Other (comment)    Diagnostic tests Radiograph report 11/07/2020: "FINDINGS:  Frontal, lateral, and spot lumbosacral lateral images were obtained.  There are 5 non-rib-bearing lumbar type vertebral bodies. 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 There is thoracolumbar levoscoliosis.  There is no fracture or spondylolisthesis. There is moderately  severe disc space narrowing at L3-4 and L4-5 with milder disc space  narrowing at L5-S1. There is facet osteoarthritic change at L5-S1  bilaterally and at L4-5 on  the right. IMPRESSION:  Scoliosis with osteoarthritic change at several levels. No fracture  or spondylolisthesis."    Patient Stated Goals "to get to where she is pain free all the time" and "to get limbered up"    Currently in Pain? No/denies            TREATMENT:   Therapeutic exercise: to centralize symptoms and improve ROM, strength, muscular endurance, and activity tolerance required for  successful completion of functional activities.   - NuStep level0-2using bilateral upper and lower extremities. Seat/handle setting 8. For improved extremity mobility, muscular endurance, and activity tolerance; and to induce the analgesic effect of aerobic exercise, stimulate improved joint nutrition, and prepare body structures and systems for following interventions. x 5 minutes. Average SPM  = 71  - prone with pillow under hips: abdominal brace with slight hip extension alternating sides (multifidus kick), 3x10 each side (concordant R low back pulling/discomfort with R hip extension - better when lower).  - sidelying hip abduction with hand on iliac crest to prevent pelvic movement, 2x10 each side (harder L >R).  -SLR with abdominal bracing 2x10, cuing for coordination and breath.  - QL stretchBLE2x30seconds -Piriformisstretch on BLE2x30secs - standing mini air squat x 8 with discussion of optimal form - standing mini squat with buttocks tap on airex and A2z pad on chair, 1x10 with focus on glute activation.  - assessment of gait during walking: mildly stiff leg in stance phase bilaterally, short step length, L > R but grossly WFL for basic mobility and no pain.    Pt required multimodal cuing for proper technique and to facilitate improved neuromuscular control, strength, range of motion, and functional ability resulting in improved performance and form.     PT Education - 01/22/21 1348    Education Details therex form/technique,    Person(s) Educated Patient    Methods Explanation;Demonstration;Tactile cues;Verbal cues    Comprehension Verbalized understanding;Returned demonstration;Verbal cues required;Tactile cues required;Need further instruction            PT Short Term Goals - 01/15/21 1514      PT SHORT TERM GOAL #1   Title Be independent with initial home exercise program for self-management of symptoms.    Baseline initial HEP to be established at visit 2 as  appropriate (12/26/2020);    Time 3    Period Weeks    Status Achieved    Target Date 01/16/21             PT Long Term Goals - 12/26/20 1958      PT LONG TERM GOAL #1   Title Be independent with a long-term home exercise program for self-management of symptoms.    Baseline initial HEP to be established at visit 2 as appropriate (12/26/2020);    Time 12    Period Weeks    Status New   TARGET DATE FOR ALL LONG TERM GOALS: 03/20/2021     PT LONG TERM GOAL #2   Title Demonstrate improved FOTO score to equal or greater than 69 to demonstrate improvement in overall condition and self-reported functional ability.    Baseline 59 (12/26/2020);    Time 12    Period Weeks    Status New      PT LONG TERM GOAL #3   Title Have full lumbar spine AROM with no compensations or increase in pain in all planes except intermittent end range discomfort to allow patient to complete valued activities with less difficulty.    Baseline painful  and limited - see objective exam (12/26/2020);    Time 12    Period Weeks    Status New      PT LONG TERM GOAL #4   Title Reduce pain with functional activities to equal or less than 1/10 to allow patient to complete usual activities including ADLs, IADLs, and social engagement with less difficulty.    Baseline 7/10 (12/26/2020);    Time 12    Period Weeks    Status New      PT LONG TERM GOAL #5   Title Complete community, work and/or recreational activities without limitation due to current condition.    Baseline limiting ability to complete her usual activities including shopping, errands, anything she is doing out and about, straightening up, activities at home without difficulty (12/26/2020);    Time 12    Period Weeks    Status New                 Plan - 01/22/21 1430    Clinical Impression Statement Patient tolerated treatment well overall and demonstrates improved tolerance for strengthening exercises. Did not perform dry needling or manual today due  to soreness overnight last session. Patient would benefit from continued management of limiting condition by skilled physical therapist to address remaining impairments and functional limitations to work towards stated goals and return to PLOF or maximal functional independence.    Personal Factors and Comorbidities Age;Comorbidity 3+;Time since onset of injury/illness/exacerbation;Past/Current Experience;Fitness    Comorbidities Relevant past medical history and comorbidities include chronic allergic asthma (epi pen in car), acute sinusitis (two surgeries), skin spot removed, hypertension, dental issues, reflux, hx of cardiac catheterization to prove she doesn't have CHF (heart was healthy), cholecystectomy, osteoarthritis, hx L knee pain (baker's cyst/meniscus).    Examination-Activity Limitations Bend;Locomotion Level;Transfers;Lift;Stand;Carry;Squat;Stairs    Examination-Participation Restrictions Community Activity;Interpersonal Relationship;Laundry;Shop;Driving;[employer] Work   usual activities including shopping, errands, anything she is doing out and about, straightening up, activities at home without difficulty.   Stability/Clinical Decision Making Evolving/Moderate complexity    Rehab Potential Good    PT Frequency 2x / week    PT Duration 12 weeks    PT Treatment/Interventions ADLs/Self Care Home Management;Aquatic Therapy;Cryotherapy;Moist Heat;Electrical Stimulation;Therapeutic activities;Gait training;Therapeutic exercise;Neuromuscular re-education;Patient/family education;Passive range of motion;Dry needling;Spinal Manipulations;Joint Manipulations;Manual techniques    PT Next Visit Plan update HEP, manual therapy, trunk strengthening, hip strengthening    PT Home Exercise Plan Medbridge Access Code: [email or web address]    Consulted and Agree with Plan of Care Patient           Patient will benefit from skilled therapeutic intervention in order to improve the following deficits and impairments:   Pain,Decreased coordination,Decreased mobility,Increased muscle spasms,Postural dysfunction,Decreased activity tolerance,Decreased endurance,Decreased range of motion,Decreased strength,Hypomobility,Impaired perceived functional ability,Difficulty walking  Visit Diagnosis: Chronic right-sided low back pain, unspecified whether sciatica present  Difficulty in walking, not elsewhere classified     Problem List Patient Active Problem List   Diagnosis Date Noted  . Low back pain 11/07/2020  . Dysuria 10/24/2020  . Pain, dental 09/29/2020  . Hypercholesterolemia 04/08/2020  . Left knee pain 04/08/2020  . COVID-19 virus infection 10/31/2019  . Stress 09/25/2019  . Joint stiffness of hand 06/26/2018  . Plantar fasciitis 06/26/2018  . Left leg pain 02/21/2018  . Hyperglycemia 02/21/2018  . Visual disturbance 02/20/2016  . Near syncope 02/20/2016  . Right lower quadrant pain 08/04/2015  . Abnormal liver function tests 08/04/2015  . UTI symptoms 03/31/2015  . Health care maintenance 02/03/2015  .  Cough 12/21/2014  . History of colonic polyps 05/29/2014  . Leukopenia 07/07/2013  . Asthma 10/10/2012  . Allergic rhinitis 10/10/2012  . GERD (gastroesophageal reflux disease) 10/10/2012  . Hypertension 10/10/2012    Everlean Alstrom. Graylon Good, PT, DPT 01/22/21, 6:04 PM  [place] [facility] [address] [place], [address] Phone: [phone]   Fax:  [phone]  Name: Chloe Moyer MRN: [id number] Date of Birth: 18-Jun-1951

## 2021-01-23 ENCOUNTER — Telehealth: Payer: Self-pay | Admitting: *Deleted

## 2021-01-23 DIAGNOSIS — R945 Abnormal results of liver function studies: Secondary | ICD-10-CM

## 2021-01-23 DIAGNOSIS — R7989 Other specified abnormal findings of blood chemistry: Secondary | ICD-10-CM

## 2021-01-23 DIAGNOSIS — E7800 Pure hypercholesterolemia, unspecified: Secondary | ICD-10-CM

## 2021-01-23 DIAGNOSIS — D72819 Decreased white blood cell count, unspecified: Secondary | ICD-10-CM

## 2021-01-23 DIAGNOSIS — Z86010 Personal history of colonic polyps: Secondary | ICD-10-CM

## 2021-01-23 DIAGNOSIS — R739 Hyperglycemia, unspecified: Secondary | ICD-10-CM

## 2021-01-23 NOTE — Telephone Encounter (Signed)
Please place future orders for lab appt.  

## 2021-01-23 NOTE — Assessment & Plan Note (Signed)
Reason for Referral: screening colonoscopy  Has the referral been discussed with the patient?:  yes  Designated contact for the referral if not the patient (name/phone number):   Has the patient seen a specialist for this issue before?: previously saw GI  .  If so, who (practice/provider)? Dr Tiffany Kocher - he retired.   Does the patient have a provider or location preference for the referral?: [facility] . Would the patient like to see previous specialist if applicable?  He retired.

## 2021-01-24 ENCOUNTER — Other Ambulatory Visit: Payer: Self-pay

## 2021-01-24 ENCOUNTER — Ambulatory Visit: Payer: PPO | Admitting: Physical Therapy

## 2021-01-24 ENCOUNTER — Encounter: Payer: Self-pay | Admitting: Physical Therapy

## 2021-01-24 ENCOUNTER — Other Ambulatory Visit (INDEPENDENT_AMBULATORY_CARE_PROVIDER_SITE_OTHER): Payer: PPO

## 2021-01-24 DIAGNOSIS — R945 Abnormal results of liver function studies: Secondary | ICD-10-CM

## 2021-01-24 DIAGNOSIS — R739 Hyperglycemia, unspecified: Secondary | ICD-10-CM | POA: Diagnosis not present

## 2021-01-24 DIAGNOSIS — R262 Difficulty in walking, not elsewhere classified: Secondary | ICD-10-CM

## 2021-01-24 DIAGNOSIS — M545 Low back pain, unspecified: Secondary | ICD-10-CM

## 2021-01-24 DIAGNOSIS — G8929 Other chronic pain: Secondary | ICD-10-CM

## 2021-01-24 DIAGNOSIS — D72819 Decreased white blood cell count, unspecified: Secondary | ICD-10-CM | POA: Diagnosis not present

## 2021-01-24 DIAGNOSIS — E7800 Pure hypercholesterolemia, unspecified: Secondary | ICD-10-CM | POA: Diagnosis not present

## 2021-01-24 DIAGNOSIS — R7989 Other specified abnormal findings of blood chemistry: Secondary | ICD-10-CM

## 2021-01-24 LAB — BASIC METABOLIC PANEL
BUN: 12 mg/dL (ref 6–23)
CO2: 26 mEq/L (ref 19–32)
Calcium: 10.1 mg/dL (ref 8.4–10.5)
Chloride: 105 mEq/L (ref 96–112)
Creatinine, Ser: 0.73 mg/dL (ref 0.40–1.20)
GFR: 82.82 mL/min (ref >60.00)
Glucose, Bld: 96 mg/dL (ref 70–99)
Potassium: 4.2 mEq/L (ref 3.5–5.1)
Sodium: 138 mEq/L (ref 135–145)

## 2021-01-24 LAB — LIPID PANEL
Cholesterol: 134 mg/dL (ref 0–200)
HDL: 49.4 mg/dL (ref >39.00)
LDL Cholesterol: 69 mg/dL (ref 0–99)
NonHDL: 84.62
Total CHOL/HDL Ratio: 3
Triglycerides: 78 mg/dL (ref 0.0–149.0)
VLDL: 15.6 mg/dL (ref 0.0–40.0)

## 2021-01-24 LAB — HEPATIC FUNCTION PANEL
ALT: 22 U/L (ref 0–35)
AST: 27 U/L (ref 0–37)
Albumin: 4.2 g/dL (ref 3.5–5.2)
Alkaline Phosphatase: 49 U/L (ref 39–117)
Bilirubin, Direct: 0.1 mg/dL (ref 0.0–0.3)
Total Bilirubin: 0.7 mg/dL (ref 0.2–1.2)
Total Protein: 7.5 g/dL (ref 6.0–8.3)

## 2021-01-24 LAB — CBC WITH DIFFERENTIAL/PLATELET
Basophils Absolute: 0 10*3/uL (ref 0.0–0.1)
Basophils Relative: 1 % (ref 0.0–3.0)
Eosinophils Absolute: 0.3 10*3/uL (ref 0.0–0.7)
Eosinophils Relative: 8 % — ABNORMAL HIGH (ref 0.0–5.0)
HCT: 37.8 % (ref 36.0–46.0)
Hemoglobin: 12.8 g/dL (ref 12.0–15.0)
Lymphocytes Relative: 41.1 % (ref 12.0–46.0)
Lymphs Abs: 1.4 10*3/uL (ref 0.7–4.0)
MCHC: 34 g/dL (ref 30.0–36.0)
MCV: 87.9 fl (ref 78.0–100.0)
Monocytes Absolute: 0.4 10*3/uL (ref 0.1–1.0)
Monocytes Relative: 10.5 % (ref 3.0–12.0)
Neutro Abs: 1.4 10*3/uL (ref 1.4–7.7)
Neutrophils Relative %: 39.4 % — ABNORMAL LOW (ref 43.0–77.0)
Platelets: 167 10*3/uL (ref 150.0–400.0)
RBC: 4.3 Mil/uL (ref 3.87–5.11)
RDW: 13.8 % (ref 11.5–15.5)
WBC: 3.4 10*3/uL — ABNORMAL LOW (ref 4.0–10.5)

## 2021-01-24 LAB — HEMOGLOBIN A1C: Hgb A1c MFr Bld: 5.7 % (ref 4.6–6.5)

## 2021-01-24 NOTE — Therapy (Signed)
[facility] [address] [place], [address] Phone: [phone]   Fax:  [phone]  Physical Therapy Treatment  Patient Details  Name: Chloe Moyer MRN: [id number] Date of Birth: Apr 10, 1951 Referring Provider (PT): Einar Pheasant, MD   Encounter Date: 01/24/2021   PT End of Session - 01/24/21 1435    Visit Number 8    Number of Visits 24    Date for PT Re-Evaluation 03/20/21    Authorization Type [employer] reporting period from 12/26/2020    Progress Note Due on Visit 10    PT Start Time 1430    PT Stop Time 1510    PT Time Calculation (min) 40 min    Activity Tolerance Patient limited by pain;Patient tolerated treatment well    Behavior During Therapy [facility] for tasks assessed/performed           Past Medical History:  Diagnosis Date  . Allergic rhinitis   . Arthritis   . Asthma   . Diverticulosis, sigmoid   . GERD (gastroesophageal reflux disease)   . History of hiatal hernia   . Hypertension 06-12-2005  . Ulcer     Past Surgical History:  Procedure Laterality Date  . BREAST BIOPSY Right 09/01/2017   Affirm Bx of 2 areas- both areas were fibroadenomatoid change   . BREAST CYST ASPIRATION Left 1990  . CARDIAC CATHETERIZATION  July 2012  . CHOLECYSTECTOMY  1991  . LIPOMA EXCISION  4/99   Left flank area  . NASAL SINUS SURGERY  July 2012  . TUBAL LIGATION  1984    There were no vitals filed for this visit.   Subjective Assessment - 01/24/21 1432    Subjective Patient reports she has 1/10 pain in her right lower back after she has been "running" all day including going to [employer]  and unloading all of the things. States it hurt one time last night but has improved a lot. She had some pain where she had exercised on the left side here at PT at last session. That pain was gone by next session and she attributes it to not doing exercise there enough.    Pertinent History  Patient is a 70 y.o. female who presents to outpatient physical therapy with a referral for medical diagnosis Right-sided low back pain without sciatica, unspecified chronicity. This patient's chief complaints consist of right sided intermittent low back pain with single instance of radiation to the R posterior thigh with onset in the last 3 months leading to the following functional deficits: disrupts shopping, errands, anything she is doing out and about, difficulty straightening up, delays activities at home Relevant past medical history and comorbidities include chronic allergic asthma (epi pen in car), acute sinusitis (two surgeries), skin spot removed, hypertension, dental issues, reflux, hx of cardiac catheterization to prove she doesn't have CHF (heart was healthy), cholecystectomy, osteoarthritis, hx L knee pain (baker's cyst/meniscus).  Patient denies hx of cancer, stroke, seizures, lung problem (besides asthma), major cardiac events, diabetes, unexplained weight loss, changes in bowel or bladder problems, new onset stumbling or dropping things, low back surgeryor bladder problems, new onset stumbling or dropping things, low back surgery.    Limitations Lifting;Walking;House hold activities;Other (comment)    Diagnostic tests Radiograph report 11/07/2020: "FINDINGS:  Frontal, lateral, and spot lumbosacral lateral images were obtained.  There are 5 non-rib-bearing lumbar type vertebral bodies. 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 There is thoracolumbar levoscoliosis.  There is no  fracture or spondylolisthesis. There is moderately  severe disc space narrowing at L3-4 and L4-5 with milder disc space  narrowing at L5-S1. There is facet osteoarthritic change at L5-S1  bilaterally and at L4-5 on the right. IMPRESSION:  Scoliosis with osteoarthritic change at several levels. No fracture  or spondylolisthesis."    Patient Stated Goals "to get to where she is pain free all the time" and "to get limbered up"     Currently in Pain? Yes    Pain Score 1     Pain Location Back    Pain Orientation Right;Lower           TREATMENT:   Therapeutic exercise: to centralize symptoms and improve ROM, strength, muscular endurance, and activity tolerance required for successful completion of functional activities.   - NuStep level2using bilateral upper and lower extremities. Seat/handle setting 8. For improved extremity mobility, muscular endurance, and activity tolerance; and to induce the analgesic effect of aerobic exercise, stimulate improved joint nutrition, and prepare body structures and systems for following interventions. x 5 minutes. Average SPM  =73 (manual therapy/needling see below) - prone press up lumbar extension 1x10 (Feels good in back, hard on arms).  - standing lumbar extension 1x10 (feels good in back).  - standing lumbar extension with plinth support behind 1x10 (feels good in back).  - seated posture correction with lumbar roll - seated R hip ER stretch 2x10 - QL stretchBLE2x30seconds - double knees to chest x 10 - sidelying hip abduction with hand on iliac crest to prevent pelvic movement, 2x10 each side (harder L >R).   Pt required multimodal cuing for proper technique and to facilitate improved neuromuscular control, strength, range of motion, and functional ability resulting in improved performance and form.   Manual therapy: to reduce pain and tissue tension, improve range of motion, neuromodulation, in order to promote improved ability to complete functional activities. - prone STM to lumbar paraspinals R > L - prone CPA with EK wedge, grade II-IV at lumbar levels  Modality: (unbilled) Dry needling performed to lumbar multifidus to decrease pain and spasms along patient's lumbar region with patient in prone utilizing (1) dry needle(s) .46mm x 70mm with (2) sticks at R lumbar multifidi and  Iliocostalis at approximately L4 level. Patient educated about the risks and  benefits from therapy and verbally consents to treatment.  Dry needling performed by Everlean Alstrom. Graylon Good PT, DPT who is certified in this technique.    HOME EXERCISE PROGRAM Access Code: [email or web address] URL: [email or web address] Date: 01/24/2021 Prepared by: Rosita Kea  Exercises Supine Double Knee to Chest - 1 x daily - 2 sets - 10 reps - 5 seconds hold Supine Lower Trunk Rotation - 1 x daily - 2 sets - 10 reps - 1-5 seconds hold Bridge Supine Quadratus Lumborum Stretch Standing Lumbar Extension with Counter - 2-4 x daily - 1-2 sets - 10 reps - 1 second hold    PT Education - 01/24/21 1435    Education Details therex form/technique,    Person(s) Educated Patient    Methods Explanation;Demonstration;Tactile cues;Verbal cues    Comprehension Verbalized understanding;Returned demonstration;Verbal cues required;Tactile cues required;Need further instruction            PT Short Term Goals - 01/15/21 1514      PT SHORT TERM GOAL #1   Title Be independent with initial home exercise program for self-management of symptoms.    Baseline initial HEP to be established at visit 2 as appropriate (12/26/2020);  Time 3    Period Weeks    Status Achieved    Target Date 01/16/21             PT Long Term Goals - 12/26/20 1958      PT LONG TERM GOAL #1   Title Be independent with a long-term home exercise program for self-management of symptoms.    Baseline initial HEP to be established at visit 2 as appropriate (12/26/2020);    Time 12    Period Weeks    Status New   TARGET DATE FOR ALL LONG TERM GOALS: 03/20/2021     PT LONG TERM GOAL #2   Title Demonstrate improved FOTO score to equal or greater than 69 to demonstrate improvement in overall condition and self-reported functional ability.    Baseline 59 (12/26/2020);    Time 12    Period Weeks    Status New      PT LONG TERM GOAL #3   Title Have full lumbar spine AROM with no compensations or increase in pain in all planes  except intermittent end range discomfort to allow patient to complete valued activities with less difficulty.    Baseline painful and limited - see objective exam (12/26/2020);    Time 12    Period Weeks    Status New      PT LONG TERM GOAL #4   Title Reduce pain with functional activities to equal or less than 1/10 to allow patient to complete usual activities including ADLs, IADLs, and social engagement with less difficulty.    Baseline 7/10 (12/26/2020);    Time 12    Period Weeks    Status New      PT LONG TERM GOAL #5   Title Complete community, work and/or recreational activities without limitation due to current condition.    Baseline limiting ability to complete her usual activities including shopping, errands, anything she is doing out and about, straightening up, activities at home without difficulty (12/26/2020);    Time 12    Period Weeks    Status New                 Plan - 01/24/21 1800    Clinical Impression Statement Patient reports significant improvement in low back pain and appears to be making progress. Reports successfully completing shopping for large items and loading them in and out of vehicle. Continues to have some recurring pain at right low back an has not recovered prior level of function. Decreased tension and pain at right lumbar paraspinals this session with dry needling/manual therapy. Introduced extension exercises as a short term trial over time to see if they improve pain control. Patient would benefit from continued management of limiting condition by skilled physical therapist to address remaining impairments and functional limitations to work towards stated goals and return to PLOF or maximal functional independence.    Personal Factors and Comorbidities Age;Comorbidity 3+;Time since onset of injury/illness/exacerbation;Past/Current Experience;Fitness    Comorbidities Relevant past medical history and comorbidities include chronic allergic asthma (epi  pen in car), acute sinusitis (two surgeries), skin spot removed, hypertension, dental issues, reflux, hx of cardiac catheterization to prove she doesn't have CHF (heart was healthy), cholecystectomy, osteoarthritis, hx L knee pain (baker's cyst/meniscus).    Examination-Activity Limitations Bend;Locomotion Level;Transfers;Lift;Stand;Carry;Squat;Stairs    Examination-Participation Restrictions Community Activity;Interpersonal Relationship;Laundry;Shop;Driving;[employer] Work   usual activities including shopping, errands, anything she is doing out and about, straightening up, activities at home without difficulty.   Stability/Clinical Decision Making  Evolving/Moderate complexity    Rehab Potential Good    PT Frequency 2x / week    PT Duration 12 weeks    PT Treatment/Interventions ADLs/Self Care Home Management;Aquatic Therapy;Cryotherapy;Moist Heat;Electrical Stimulation;Therapeutic activities;Gait training;Therapeutic exercise;Neuromuscular re-education;Patient/family education;Passive range of motion;Dry needling;Spinal Manipulations;Joint Manipulations;Manual techniques    PT Next Visit Plan update HEP, manual therapy, trunk strengthening, hip strengthening    PT Home Exercise Plan Medbridge Access Code: [email or web address]    Consulted and Agree with Plan of Care Patient           Patient will benefit from skilled therapeutic intervention in order to improve the following deficits and impairments:  Pain,Decreased coordination,Decreased mobility,Increased muscle spasms,Postural dysfunction,Decreased activity tolerance,Decreased endurance,Decreased range of motion,Decreased strength,Hypomobility,Impaired perceived functional ability,Difficulty walking  Visit Diagnosis: Chronic right-sided low back pain, unspecified whether sciatica present  Difficulty in walking, not elsewhere classified     Problem List Patient Active Problem List   Diagnosis Date Noted  . Low back pain 11/07/2020  . Dysuria  10/24/2020  . Pain, dental 09/29/2020  . Hypercholesterolemia 04/08/2020  . Left knee pain 04/08/2020  . COVID-19 virus infection 10/31/2019  . Stress 09/25/2019  . Joint stiffness of hand 06/26/2018  . Plantar fasciitis 06/26/2018  . Left leg pain 02/21/2018  . Hyperglycemia 02/21/2018  . Visual disturbance 02/20/2016  . Near syncope 02/20/2016  . Right lower quadrant pain 08/04/2015  . Abnormal liver function tests 08/04/2015  . UTI symptoms 03/31/2015  . Health care maintenance 02/03/2015  . Cough 12/21/2014  . History of colonic polyps 05/29/2014  . Leukopenia 07/07/2013  . Asthma 10/10/2012  . Allergic rhinitis 10/10/2012  . GERD (gastroesophageal reflux disease) 10/10/2012  . Hypertension 10/10/2012    Everlean Alstrom. Graylon Good, PT, DPT 01/24/21, 6:01 PM  [facility] [address] [place], [address] Phone: [phone]   Fax:  [phone]  Name: CRISTALLE ROHM MRN: [id number] Date of Birth: 07-02-1951

## 2021-01-24 NOTE — Telephone Encounter (Signed)
Order placed for f/u labs.  

## 2021-01-28 ENCOUNTER — Ambulatory Visit (INDEPENDENT_AMBULATORY_CARE_PROVIDER_SITE_OTHER): Payer: PPO | Admitting: Internal Medicine

## 2021-01-28 ENCOUNTER — Other Ambulatory Visit: Payer: Self-pay

## 2021-01-28 DIAGNOSIS — K219 Gastro-esophageal reflux disease without esophagitis: Secondary | ICD-10-CM | POA: Diagnosis not present

## 2021-01-28 DIAGNOSIS — M545 Low back pain, unspecified: Secondary | ICD-10-CM | POA: Diagnosis not present

## 2021-01-28 DIAGNOSIS — E7800 Pure hypercholesterolemia, unspecified: Secondary | ICD-10-CM | POA: Diagnosis not present

## 2021-01-28 DIAGNOSIS — J4520 Mild intermittent asthma, uncomplicated: Secondary | ICD-10-CM

## 2021-01-28 DIAGNOSIS — R739 Hyperglycemia, unspecified: Secondary | ICD-10-CM | POA: Diagnosis not present

## 2021-01-28 DIAGNOSIS — R945 Abnormal results of liver function studies: Secondary | ICD-10-CM

## 2021-01-28 DIAGNOSIS — D72819 Decreased white blood cell count, unspecified: Secondary | ICD-10-CM

## 2021-01-28 DIAGNOSIS — I10 Essential (primary) hypertension: Secondary | ICD-10-CM | POA: Diagnosis not present

## 2021-01-28 DIAGNOSIS — R7989 Other specified abnormal findings of blood chemistry: Secondary | ICD-10-CM

## 2021-01-28 MED ORDER — OMEPRAZOLE 10 MG PO CPDR: 10.0000 mg | DELAYED_RELEASE_CAPSULE | Freq: Every day | ORAL | 3 refills | Status: DC

## 2021-01-28 NOTE — Progress Notes (Addendum)
Patient ID: SAVANNAHA STONEROCK, female   DOB: 08-Feb-1951, 70 y.o.   MRN: [id number]   Subjective:    Patient ID: KEEVA REISEN, female    DOB: 29-Apr-1951, 70 y.o.   MRN: [id number]  HPI This visit occurred during the SARS-CoV-2 public health emergency.  Safety protocols were in place, including screening questions prior to the visit, additional usage of staff PPE, and extensive cleaning of exam room while observing appropriate contact time as indicated for disinfecting solutions.  Patient here for a scheduled follow up.  Here to follow up regarding her blood pressure, cholesterol and breathing.  Has been going to PT for right low back pain.  Working well.  Tries to stay active.  No chest pain or sob reported.  Breathing has been stable - when on her medication regularly.  Request refill omeprazole.  If watches what she eats and takes prilosec - controlled relatively well.  No abdominal pain.  Bowels moving.     Past Medical History:  Diagnosis Date  . Allergic rhinitis   . Arthritis   . Asthma   . Diverticulosis, sigmoid   . GERD (gastroesophageal reflux disease)   . History of hiatal hernia   . Hypertension 06-12-2005  . Ulcer    Past Surgical History:  Procedure Laterality Date  . BREAST BIOPSY Right 09/01/2017   Affirm Bx of 2 areas- both areas were fibroadenomatoid change   . BREAST CYST ASPIRATION Left 1990  . CARDIAC CATHETERIZATION  July 2012  . CHOLECYSTECTOMY  1991  . LIPOMA EXCISION  4/99   Left flank area  . NASAL SINUS SURGERY  July 2012  . TUBAL LIGATION  1984   Family History  Problem Relation Age of Onset  . Emphysema Father        smoked  . Asthma Father   . Lung cancer Father        smoked  . Hypertension Father   . Hypertension Mother   . Cancer Mother        oral  . Breast cancer Maternal Grandmother 37  . Heart disease Paternal Grandfather        myocardial infarction  . Stroke Maternal Grandfather    Social History   Socioeconomic History  .  Marital status: Married    Spouse name: Not on file  . Number of children: 2  . Years of education: Not on file  . Highest education level: Not on file  Occupational History  . Occupation: Works at [employer]  Tobacco Use  . Smoking status: Never Smoker  . Smokeless tobacco: Never Used  Substance and Sexual Activity  . Alcohol use: Yes    Alcohol/week: 0.0 standard drinks    Comment: occ wine with dinner  . Drug use: No  . Sexual activity: Yes  Other Topics Concern  . Not on file  Social History Narrative  . Not on file   Social Determinants of Health   Financial Resource Strain: Low Risk   . Difficulty of Paying Living Expenses: Not hard at all  Food Insecurity: No Food Insecurity  . Worried About Charity fundraiser in the Last Year: Never true  . Ran Out of Food in the Last Year: Never true  Transportation Needs: No Transportation Needs  . Lack of Transportation (Medical): No  . Lack of Transportation (Non-Medical): No  Physical Activity: Not on file  Stress: No Stress Concern Present  . Feeling of Stress : Not at all  Social Connections:  Socially Integrated  . Frequency of Communication with Friends and Family: More than three times a week  . Frequency of Social Gatherings with Friends and Family: More than three times a week  . Attends Religious Services: More than 4 times per year  . Active Member of Clubs or Organizations: Yes  . Attends Archivist Meetings: More than 4 times per year  . Marital Status: Married    Outpatient Encounter Medications as of 01/28/2021  Medication Sig  . albuterol (PROVENTIL HFA;VENTOLIN HFA) 108 (90 BASE) MCG/ACT inhaler Inhale 2 puffs using inhaler every 4-6 hours as needed for SOB/wheeze.  . budesonide (PULMICORT) 0.5 MG/2ML nebulizer solution Take 0.5 mg by nebulization 2 (two) times daily.  . budesonide-formoterol (SYMBICORT) 160-4.5 MCG/ACT inhaler Inhale 2 puffs into the lungs daily. as directed.  . celecoxib  (CELEBREX) 200 MG capsule Take by mouth.  . EPINEPHrine (EPIPEN 2-PAK) 0.3 mg/0.3 mL IJ SOAJ injection Inject 0.3 mLs (0.3 mg total) into the muscle once.  . hydrocortisone (ANUSOL-HC) 25 MG suppository Place 1 suppository (25 mg total) rectally 2 (two) times daily as needed. (Patient not taking: Reported on 12/26/2020)  . Hypertonic Nasal Wash (SINUS RINSE NA) As directed three times daily  . ipratropium-albuterol (DUONEB) 0.5-2.5 (3) MG/3ML SOLN   . losartan (COZAAR) 100 MG tablet TAKE 1 TABLET BY MOUTH ONCE DAILY  . omalizumab (XOLAIR) 150 MG injection 150 mg every 30 (thirty) days.   Marland Kitchen omeprazole (PRILOSEC) 10 MG capsule Take 1 capsule (10 mg total) by mouth daily.  . rosuvastatin (CRESTOR) 5 MG tablet TAKE 1 TABLET BY MOUTH ON MONDAY AND THURSDAY  . [DISCONTINUED] ALPRAZolam (XANAX) 0.25 MG tablet Take 1 tablet (0.25 mg total) by mouth daily as needed for anxiety. (Patient not taking: Reported on 12/26/2020)  . [DISCONTINUED] fluconazole (DIFLUCAN) 150 MG tablet Take one tablet x 1 day.  May repeat x 1 in three days if persistent symptoms. (Patient not taking: Reported on 12/26/2020)  . [DISCONTINUED] omeprazole (PRILOSEC) 10 MG capsule TAKE 1 CAPSULE BY MOUTH ONCE DAILY   No facility-administered encounter medications on file as of 01/28/2021.    Review of Systems  Constitutional: Negative for appetite change and unexpected weight change.  HENT: Negative for congestion and sinus pressure.   Respiratory: Negative for cough and chest tightness.        Breathing overall stable.    Cardiovascular: Negative for chest pain, palpitations and leg swelling.  Gastrointestinal: Negative for abdominal pain, diarrhea, nausea and vomiting.  Genitourinary: Negative for difficulty urinating and dysuria.  Musculoskeletal: Negative for joint swelling and myalgias.  Skin: Negative for color change and rash.  Neurological: Negative for dizziness, light-headedness and headaches.  Psychiatric/Behavioral:  Negative for agitation and dysphoric mood.       Objective:    Physical Exam Vitals reviewed.  Constitutional:      General: She is not in acute distress.    Appearance: Normal appearance.  HENT:     Head: Normocephalic and atraumatic.     Right Ear: External ear normal.     Left Ear: External ear normal.  Eyes:     General: No scleral icterus.       Right eye: No discharge.        Left eye: No discharge.     Conjunctiva/sclera: Conjunctivae normal.  Neck:     Thyroid: No thyromegaly.  Cardiovascular:     Rate and Rhythm: Normal rate and regular rhythm.  Pulmonary:     Effort: No  respiratory distress.     Breath sounds: Normal breath sounds. No wheezing.  Abdominal:     General: Bowel sounds are normal.     Palpations: Abdomen is soft.     Tenderness: There is no abdominal tenderness.  Musculoskeletal:        General: No swelling or tenderness.     Cervical back: Neck supple. No tenderness.  Lymphadenopathy:     Cervical: No cervical adenopathy.  Skin:    Findings: No erythema or rash.  Neurological:     Mental Status: She is alert.  Psychiatric:        Mood and Affect: Mood normal.        Behavior: Behavior normal.     BP 120/78   Pulse 80   Temp 97.6 F (36.4 C) (Oral)   Resp 16   Ht _0  (1.676 m)   Wt 168 lb 9.6 oz (76.5 kg)   LMP 11/03/1996   SpO2 98%   BMI 27.21 kg/m  Wt Readings from Last 3 Encounters:  01/28/21 168 lb 9.6 oz (76.5 kg)  11/07/20 170 lb 12.8 oz (77.5 kg)  10/24/20 174 lb (78.9 kg)     Lab Results  Component Value Date   WBC 3.4 (L) 01/24/2021   HGB 12.8 01/24/2021   HCT 37.8 01/24/2021   PLT 167.0 01/24/2021   GLUCOSE 96 01/24/2021   CHOL 134 01/24/2021   TRIG 78.0 01/24/2021   HDL 49.40 01/24/2021   LDLCALC 69 01/24/2021   ALT 22 01/24/2021   AST 27 01/24/2021   NA 138 01/24/2021   K 4.2 01/24/2021   CL 105 01/24/2021   CREATININE 0.73 01/24/2021   BUN 12 01/24/2021   CO2 26 01/24/2021   TSH 3.72 03/27/2020    INR 0.94 01/07/2017   HGBA1C 5.7 01/24/2021   MICROALBUR 1.2 03/27/2020    DG Lumbar Spine 2-3 Views  Result Date: 11/08/2020 CLINICAL DATA:  Low back pain EXAM: LUMBAR SPINE - 2-3 VIEW COMPARISON:  None. FINDINGS: Frontal, lateral, and spot lumbosacral lateral images were obtained. There are 5 non-rib-bearing lumbar type vertebral bodies. T12 ribs are markedly hypoplastic. There is thoracolumbar levoscoliosis. There is no fracture or spondylolisthesis. There is moderately severe disc space narrowing at L3-4 and L4-5 with milder disc space narrowing at L5-S1. There is facet osteoarthritic change at L5-S1 bilaterally and at L4-5 on the right. IMPRESSION: Scoliosis with osteoarthritic change at several levels. No fracture or spondylolisthesis. Electronically Signed   By: Lowella Grip III M.D.   On: 11/08/2020 08:15       Assessment & Plan:   Problem List Items Addressed This Visit    Abnormal liver function tests    S/p liver biopsy.  Recheck liver panel wnl.       Asthma    Breathing overall stable - xolair.       GERD (gastroesophageal reflux disease)    Request refill omeprazole.  If takes omeprazole and watches what she eats - controlled.  Follow.       Relevant Medications   omeprazole (PRILOSEC) 10 MG capsule   Hypercholesterolemia    Continue crestor. Controlled.  Low cholesterol diet and exercise.  Follow lipid panel and liver function tests.   Lab Results  Component Value Date   CHOL 134 01/24/2021   HDL 49.40 01/24/2021   LDLCALC 69 01/24/2021   TRIG 78.0 01/24/2021   CHOLHDL 3 01/24/2021        Relevant Orders   Lipid panel  Hepatic function panel   Hyperglycemia    Low carb diet and exercise.  Follow met b and a1c.  Discussed recent labs.   Lab Results  Component Value Date   HGBA1C 5.7 01/24/2021        Relevant Orders   Hemoglobin A1c   Hypertension    Continue losartan.  Blood pressure as outlined.  Follow pressures.  Follow metabolic panel.        Relevant Orders   TSH   Basic metabolic panel   Leukopenia    Follow cbc.  WBC count 3.4 - relatively stable.  Follow.       Relevant Orders   CBC with Differential/Platelet   Low back pain    Going to PT.  Working well.  Follow.           Einar Pheasant, MD

## 2021-01-29 ENCOUNTER — Encounter: Payer: PPO | Admitting: Physical Therapy

## 2021-01-29 DIAGNOSIS — J4551 Severe persistent asthma with (acute) exacerbation: Secondary | ICD-10-CM | POA: Diagnosis not present

## 2021-01-31 ENCOUNTER — Ambulatory Visit: Payer: PPO | Admitting: Physical Therapy

## 2021-01-31 ENCOUNTER — Other Ambulatory Visit: Payer: Self-pay

## 2021-01-31 ENCOUNTER — Encounter: Payer: Self-pay | Admitting: Physical Therapy

## 2021-01-31 DIAGNOSIS — M545 Low back pain, unspecified: Secondary | ICD-10-CM | POA: Diagnosis not present

## 2021-01-31 DIAGNOSIS — R262 Difficulty in walking, not elsewhere classified: Secondary | ICD-10-CM

## 2021-01-31 NOTE — Therapy (Signed)
[facility] [address], [place], [address] Phone: [phone]   Fax:  [phone]  Physical Therapy Treatment  Patient Details  Name: Chloe Moyer MRN: [id number] Date of Birth: 1951/05/12 Referring Provider (PT): Einar Pheasant, MD   Encounter Date: 01/31/2021   PT End of Session - 01/31/21 1306    Visit Number 9    Number of Visits 24    Date for PT Re-Evaluation 03/20/21    Authorization Type [employer] reporting period from 12/26/2020    Progress Note Due on Visit 10    PT Start Time 1300    PT Stop Time 1340    PT Time Calculation (min) 40 min    Activity Tolerance Patient limited by pain;Patient tolerated treatment well    Behavior During Therapy [facility] for tasks assessed/performed           Past Medical History:  Diagnosis Date  . Allergic rhinitis   . Arthritis   . Asthma   . Diverticulosis, sigmoid   . GERD (gastroesophageal reflux disease)   . History of hiatal hernia   . Hypertension 06-12-2005  . Ulcer     Past Surgical History:  Procedure Laterality Date  . BREAST BIOPSY Right 09/01/2017   Affirm Bx of 2 areas- both areas were fibroadenomatoid change   . BREAST CYST ASPIRATION Left 1990  . CARDIAC CATHETERIZATION  July 2012  . CHOLECYSTECTOMY  1991  . LIPOMA EXCISION  4/99   Left flank area  . NASAL SINUS SURGERY  July 2012  . TUBAL LIGATION  1984    There were no vitals filed for this visit.   Subjective Assessment - 01/31/21 1304    Subjective Patient reports she is feeling well today but continues to have a bit of pain in her right low back, rated 1/10 currently. She felt okay following last treatment session. She has been riding around today and is feeling the pain. She think she was okay following last treatment session. Patient was able to get down on the floor and clean her baseboards yesterday and has not been able to do that for quite some time. States she  feels really limited in her ability to participate in hiking and beach activities with her family. She can go hiking but it "just about kills me." Describes outings to grandfather mounting and all day or half day hikes in hilly regions. Also cannot play with her grandson on the beach as she wants. Also has trouble with a lot of stairs. Wants to be as active as family is (feels the hip/back is stopping her the most).  Will be going to the beach with husband first week in april (won't do anything strenuous).    Pertinent History Patient is a 70 y.o. female who presents to outpatient physical therapy with a referral for medical diagnosis Right-sided low back pain without sciatica, unspecified chronicity. This patient's chief complaints consist of right sided intermittent low back pain with single instance of radiation to the R posterior thigh with onset in the last 3 months leading to the following functional deficits: disrupts shopping, errands, anything she is doing out and about, difficulty straightening up, delays activities at home Relevant past medical history and comorbidities include chronic allergic asthma (epi pen in car), acute sinusitis (two surgeries), skin spot removed, hypertension, dental issues, reflux, hx of cardiac catheterization to prove she doesn't have CHF (heart was healthy), cholecystectomy, osteoarthritis, hx L knee pain (baker's  cyst/meniscus).  Patient denies hx of cancer, stroke, seizures, lung problem (besides asthma), major cardiac events, diabetes, unexplained weight loss, changes in bowel or bladder problems, new onset stumbling or dropping things, low back surgeryor bladder problems, new onset stumbling or dropping things, low back surgery.    Limitations Lifting;Walking;House hold activities;Other (comment)    Diagnostic tests Radiograph report 11/07/2020: "FINDINGS:  Frontal, lateral, and spot lumbosacral lateral images were obtained.  There are 5 non-rib-bearing lumbar type  vertebral bodies. 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 There is thoracolumbar levoscoliosis.  There is no fracture or spondylolisthesis. There is moderately  severe disc space narrowing at L3-4 and L4-5 with milder disc space  narrowing at L5-S1. There is facet osteoarthritic change at L5-S1  bilaterally and at L4-5 on the right. IMPRESSION:  Scoliosis with osteoarthritic change at several levels. No fracture  or spondylolisthesis."    Patient Stated Goals "to get to where she is pain free all the time" and "to get limbered up"    Currently in Pain? Yes    Pain Score 1     Pain Location Back    Pain Orientation Right;Lower           TREATMENT:  Therapeutic exercise:to centralize symptoms and improve ROM, strength, muscular endurance, and activity tolerance required for successful completion of functional activities.   - NuStep level2using bilateral upper and lower extremities. Seat/handle setting 8. For improved extremity mobility, muscular endurance, and activity tolerance; and to induce the analgesic effect of aerobic exercise, stimulate improved joint nutrition, and prepare body structures and systems for following interventions. x 6 minutes. Average SPM=70(pulling in the right low back at a different place than usual).  - standing lumbar extension with plinth support behind 2x10 (feels good in back).   (manual therapy see below)  - QL stretchBLE2x30seconds - hooklying B hip abduction with GTB around knee, 3 second hold, 1x10 - hooklying bridge with B hip abduction with GTB around knees, 5 second hold, 1x20 - runner's step up to 9 inch step with unilateral UE support as needed, 2x10 each side (to work towards improving climbing ability).  - Education on HEP including handout    Pt required multimodal cuing for proper technique and to facilitate improved neuromuscular control, strength, range of motion, and functional ability resulting in improved performance and form.    Manual therapy: to reduce pain and tissue tension, improve range of motion, neuromodulation, in order to promote improved ability to complete functional activities. - prone STM to lumbothoracic paraspinals R > L - prone CPA with/without EK wedge, grade II-IV at lower thoracic and lumbar levels  HOME EXERCISE PROGRAM Access Code: [email or web address] URL: [email or web address] Date: 01/31/2021 Prepared by: Rosita Kea  Exercises Supine Double Knee to Chest - 1 x daily - 2 sets - 10 reps - 5 seconds hold Supine Lower Trunk Rotation - 1 x daily - 2 sets - 10 reps - 1-5 seconds hold Supine Quadratus Lumborum Stretch - 1 x daily - 2 reps - 30 seconds hold Standing Lumbar Extension with Counter - 2-4 x daily - 1-2 sets - 10 reps - 1 second hold Bridge with Hip Abduction and Resistance - 1 x daily - 1 sets - 20 reps - 5seconds hold    PT Education - 01/31/21 1306    Education Details therex form/technique,    Person(s) Educated Patient    Methods Explanation;Demonstration;Tactile cues;Verbal cues    Comprehension Verbalized understanding;Returned demonstration;Verbal cues required;Tactile cues required;Need further instruction  PT Short Term Goals - 01/15/21 1514      PT SHORT TERM GOAL #1   Title Be independent with initial home exercise program for self-management of symptoms.    Baseline initial HEP to be established at visit 2 as appropriate (12/26/2020);    Time 3    Period Weeks    Status Achieved    Target Date 01/16/21             PT Long Term Goals - 12/26/20 1958      PT LONG TERM GOAL #1   Title Be independent with a long-term home exercise program for self-management of symptoms.    Baseline initial HEP to be established at visit 2 as appropriate (12/26/2020);    Time 12    Period Weeks    Status New   TARGET DATE FOR ALL LONG TERM GOALS: 03/20/2021     PT LONG TERM GOAL #2   Title Demonstrate improved FOTO score to equal or greater than 69 to  demonstrate improvement in overall condition and self-reported functional ability.    Baseline 59 (12/26/2020);    Time 12    Period Weeks    Status New      PT LONG TERM GOAL #3   Title Have full lumbar spine AROM with no compensations or increase in pain in all planes except intermittent end range discomfort to allow patient to complete valued activities with less difficulty.    Baseline painful and limited - see objective exam (12/26/2020);    Time 12    Period Weeks    Status New      PT LONG TERM GOAL #4   Title Reduce pain with functional activities to equal or less than 1/10 to allow patient to complete usual activities including ADLs, IADLs, and social engagement with less difficulty.    Baseline 7/10 (12/26/2020);    Time 12    Period Weeks    Status New      PT LONG TERM GOAL #5   Title Complete community, work and/or recreational activities without limitation due to current condition.    Baseline limiting ability to complete her usual activities including shopping, errands, anything she is doing out and about, straightening up, activities at home without difficulty (12/26/2020);    Time 12    Period Weeks    Status New                 Plan - 01/31/21 1900    Clinical Impression Statement Patient tolerated treatment well overall and reported reduced pain by end of session. Progressed to step ups to work towards improved activity tolerance for hiking and beach activities that patient misses being able to do. Updated HEP to include more advanced bridge. Patient would benefit from continued management of limiting condition by skilled physical therapist to address remaining impairments and functional limitations to work towards stated goals and return to PLOF or maximal functional independence.    Personal Factors and Comorbidities Age;Comorbidity 3+;Time since onset of injury/illness/exacerbation;Past/Current Experience;Fitness    Comorbidities Relevant past medical history and  comorbidities include chronic allergic asthma (epi pen in car), acute sinusitis (two surgeries), skin spot removed, hypertension, dental issues, reflux, hx of cardiac catheterization to prove she doesn't have CHF (heart was healthy), cholecystectomy, osteoarthritis, hx L knee pain (baker's cyst/meniscus).    Examination-Activity Limitations Bend;Locomotion Level;Transfers;Lift;Stand;Carry;Squat;Stairs    Examination-Participation Restrictions Community Activity;Interpersonal Relationship;Laundry;Shop;Driving;[employer] Work   usual activities including shopping, errands, anything she is doing out and  about, straightening up, activities at home without difficulty.   Stability/Clinical Decision Making Evolving/Moderate complexity    Rehab Potential Good    PT Frequency 2x / week    PT Duration 12 weeks    PT Treatment/Interventions ADLs/Self Care Home Management;Aquatic Therapy;Cryotherapy;Moist Heat;Electrical Stimulation;Therapeutic activities;Gait training;Therapeutic exercise;Neuromuscular re-education;Patient/family education;Passive range of motion;Dry needling;Spinal Manipulations;Joint Manipulations;Manual techniques    PT Next Visit Plan manual therapy, trunk strengthening, hip strengthening    PT Home Exercise Plan Medbridge Access Code: [email or web address]    Consulted and Agree with Plan of Care Patient           Patient will benefit from skilled therapeutic intervention in order to improve the following deficits and impairments:  Pain,Decreased coordination,Decreased mobility,Increased muscle spasms,Postural dysfunction,Decreased activity tolerance,Decreased endurance,Decreased range of motion,Decreased strength,Hypomobility,Impaired perceived functional ability,Difficulty walking  Visit Diagnosis: Chronic right-sided low back pain, unspecified whether sciatica present  Difficulty in walking, not elsewhere classified     Problem List Patient Active Problem List   Diagnosis Date Noted  . Low  back pain 11/07/2020  . Dysuria 10/24/2020  . Pain, dental 09/29/2020  . Hypercholesterolemia 04/08/2020  . Left knee pain 04/08/2020  . COVID-19 virus infection 10/31/2019  . Stress 09/25/2019  . Joint stiffness of hand 06/26/2018  . Plantar fasciitis 06/26/2018  . Left leg pain 02/21/2018  . Hyperglycemia 02/21/2018  . Visual disturbance 02/20/2016  . Near syncope 02/20/2016  . Right lower quadrant pain 08/04/2015  . Abnormal liver function tests 08/04/2015  . UTI symptoms 03/31/2015  . Health care maintenance 02/03/2015  . Cough 12/21/2014  . History of colonic polyps 05/29/2014  . Leukopenia 07/07/2013  . Asthma 10/10/2012  . Allergic rhinitis 10/10/2012  . GERD (gastroesophageal reflux disease) 10/10/2012  . Hypertension 10/10/2012    Everlean Alstrom. Graylon Good, PT, DPT 01/31/21, 7:02 PM  [facility] [address], [place], [address] Phone: [phone]   Fax:  [phone]  Name: RAUSHANAH OSMUNDSON MRN: [id number] Date of Birth: [birth year]

## 2021-02-04 ENCOUNTER — Encounter: Payer: Self-pay | Admitting: Internal Medicine

## 2021-02-04 NOTE — Assessment & Plan Note (Signed)
Request refill omeprazole.  If takes omeprazole and watches what she eats - controlled.  Follow.

## 2021-02-04 NOTE — Assessment & Plan Note (Signed)
Low carb diet and exercise.  Follow met b and a1c.  Discussed recent labs.   Lab Results  Component Value Date   HGBA1C 5.7 01/24/2021

## 2021-02-04 NOTE — Assessment & Plan Note (Signed)
Continue losartan.  Blood pressure as outlined.  Follow pressures.  Follow metabolic panel.  

## 2021-02-04 NOTE — Assessment & Plan Note (Signed)
S/p liver biopsy.  Recheck liver panel wnl.

## 2021-02-04 NOTE — Assessment & Plan Note (Signed)
Follow cbc.  WBC count 3.4 - relatively stable.  Follow.

## 2021-02-04 NOTE — Addendum Note (Signed)
Addended by: Alisa Graff on: 02/04/2021 11:13 AM   Modules accepted: Level of Service

## 2021-02-04 NOTE — Assessment & Plan Note (Signed)
Breathing overall stable - xolair.

## 2021-02-04 NOTE — Assessment & Plan Note (Signed)
Going to PT.  Working well.  Follow.

## 2021-02-04 NOTE — Assessment & Plan Note (Signed)
Continue crestor. Controlled.  Low cholesterol diet and exercise.  Follow lipid panel and liver function tests.   Lab Results  Component Value Date   CHOL 134 01/24/2021   HDL 49.40 01/24/2021   LDLCALC 69 01/24/2021   TRIG 78.0 01/24/2021   CHOLHDL 3 01/24/2021

## 2021-02-05 ENCOUNTER — Ambulatory Visit: Payer: PPO | Admitting: Physical Therapy

## 2021-02-05 ENCOUNTER — Encounter: Payer: Self-pay | Admitting: Physical Therapy

## 2021-02-05 ENCOUNTER — Other Ambulatory Visit: Payer: Self-pay

## 2021-02-05 DIAGNOSIS — M545 Low back pain, unspecified: Secondary | ICD-10-CM

## 2021-02-05 DIAGNOSIS — R262 Difficulty in walking, not elsewhere classified: Secondary | ICD-10-CM

## 2021-02-05 DIAGNOSIS — G8929 Other chronic pain: Secondary | ICD-10-CM

## 2021-02-05 NOTE — Progress Notes (Signed)
Thank you for the follow up.    Dr Nicki Reaper

## 2021-02-05 NOTE — Therapy (Signed)
[facility] [address] [place], [address] Phone: [phone]   Fax:  [phone]  Physical Therapy Treatment / Progress Note Dates of reporting: 12/26/2020 - 02/05/2021  Patient Details  Name: Chloe Moyer MRN: [id number] Date of Birth: 1951/11/17 Referring Provider (PT): Einar Pheasant, MD   Encounter Date: 02/05/2021   PT End of Session - 02/05/21 1259    Visit Number 10    Number of Visits 24    Date for PT Re-Evaluation 03/20/21    Authorization Type [employer] reporting period from 12/26/2020    Progress Note Due on Visit 10    PT Start Time 1300    PT Stop Time 1345    PT Time Calculation (min) 45 min    Activity Tolerance Patient limited by pain;Patient tolerated treatment well    Behavior During Therapy [facility] for tasks assessed/performed           Past Medical History:  Diagnosis Date  . Allergic rhinitis   . Arthritis   . Asthma   . Diverticulosis, sigmoid   . GERD (gastroesophageal reflux disease)   . History of hiatal hernia   . Hypertension 06-12-2005  . Ulcer     Past Surgical History:  Procedure Laterality Date  . BREAST BIOPSY Right 09/01/2017   Affirm Bx of 2 areas- both areas were fibroadenomatoid change   . BREAST CYST ASPIRATION Left 1990  . CARDIAC CATHETERIZATION  July 2012  . CHOLECYSTECTOMY  1991  . LIPOMA EXCISION  4/99   Left flank area  . NASAL SINUS SURGERY  July 2012  . TUBAL LIGATION  1984    There were no vitals filed for this visit.   Subjective Assessment - 02/05/21 1302    Subjective Patient reports that "everything I do in here" helps at PT. She walked 12,000 steps yesterday which caused a bit of soreness in her back and right thigh but she feels she would not have been able to do that in the past. She currently has pain 1/10 in the right low back and right lateral thigh (feels the thigh is exertion from walking so far). She went [place] the zoo with  intermittant sitting. She said the highest pain she has had in the last week is 3/10. Reports HEP is going well. States she has trouble with stairs still and contiues to feel limited in her ability [place]. She was a bit sore after she was here last in the lower back.    Pertinent History Patient is a 70 y.o. female who presents to outpatient physical therapy with a referral for medical diagnosis Right-sided low back pain without sciatica, unspecified chronicity. This patient's chief complaints consist of right sided intermittent low back pain with single instance of radiation to the R posterior thigh with onset in the last 3 months leading to the following functional deficits: disrupts shopping, errands, anything she is doing out and about, difficulty straightening up, delays activities at home Relevant past medical history and comorbidities include chronic allergic asthma (epi pen in car), acute sinusitis (two surgeries), skin spot removed, hypertension, dental issues, reflux, hx of cardiac catheterization to prove she doesn't have CHF (heart was healthy), cholecystectomy, osteoarthritis, hx L knee pain (baker's cyst/meniscus).  Patient denies hx of cancer, stroke, seizures, lung problem (besides asthma), major cardiac events, diabetes, unexplained weight loss, changes in bowel or bladder problems, new onset stumbling or dropping things, low back surgeryor  bladder problems, new onset stumbling or dropping things, low back surgery.    Limitations Lifting;Walking;House hold activities;Other (comment)    Diagnostic tests Radiograph report 11/07/2020: "FINDINGS:  Frontal, lateral, and spot lumbosacral lateral images were obtained.  There are 5 non-rib-bearing lumbar type vertebral bodies. 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 There is thoracolumbar levoscoliosis.  There is no fracture or spondylolisthesis. There is moderately  severe disc space narrowing at L3-4 and L4-5 with milder disc space   narrowing at L5-S1. There is facet osteoarthritic change at L5-S1  bilaterally and at L4-5 on the right. IMPRESSION:  Scoliosis with osteoarthritic change at several levels. No fracture  or spondylolisthesis."    Patient Stated Goals "to get to where she is pain free all the time" and "to get limbered up"    Currently in Pain? Yes    Pain Score 1     Pain Location Back    Pain Orientation Right;Lower    Pain Radiating Towards lateral R thigh above knee          OBJECTIVE FOTO = 65 (02/05/2021)  SPINE MOTION Lumbar AROM *Indicates pain   Flexion: = fingers to toes, no pain, normal for patient.  Extension: = 50% no pain  Rotation: R = motion WFL concordant pull at right, L = WFL  Side Flexion: R = 50% no pain, L = 50% concordant pulling in right   TREATMENT:  Therapeutic exercise:to centralize symptoms and improve ROM, strength, muscular endurance, and activity tolerance required for successful completion of functional activities.  - NuStep level2using bilateral upper and lower extremities. Seat/handle setting 8. For improved extremity mobility, muscular endurance, and activity tolerance; and to induce the analgesic effect of aerobic exercise, stimulate improved joint nutrition, and prepare body structures and systems for following interventions. x 6 minutes. Average SPM=72 - standing lumbar extension with plinth support behind x20 (states this always helps, feels good in back, abolished leg pain).  - standing lumbar extension with hands at pelvis, 1x20 (back getting sore, no leg pain).  - seated posture correction with lumbar roll.  - measurements to assess progress (See above).  - Education on HEP including handout   Pt required multimodal cuing for proper technique and to facilitate improved neuromuscular control, strength, range of motion, and functional ability resulting in improved performance and form.   Manual therapy:to reduce pain and tissue tension,  improve range of motion, neuromodulation, in order to promote improved ability to complete functional activities. - hooklying long axis distraction through R hip, 3x30 seconds (feels good) - hooklying R hip joint mob with mob belt, grade II-IV in caudal and posterolateral direction. Difficult to tolerate pressure at groin but reports feels improvement in hip.  - prone STM to lumbothoracic paraspinals R  Modality: (unbilled) Dry needling performed to right lumbar spine to decrease pain and spasms along patient's low back region with patient in prone utilizing (1) dry needle(s) .61m x 776mwith (3) sticks at right lumbar multifidi at approximately L4,L5, and S1 and (1) stick at R Iliocostalis muscle at approximately L4 level . Patient educated about the risks and benefits from therapy and verbally consents to treatment.  Dry needling performed by SaEverlean AlstromSnGraylon GoodT, DPT who is certified in this technique.   HOME EXERCISE PROGRAM Access Code: [email or web address]: [email or web address] Date: 01/31/2021 Prepared by: SaRosita KeaExercises Supine Double Knee to Chest - 1 x daily - 2 sets - 10 reps - 5 seconds hold Supine Lower Trunk Rotation - 1  x daily - 2 sets - 10 reps - 1-5 seconds hold Supine Quadratus Lumborum Stretch - 1 x daily - 2 reps - 30 seconds hold Standing Lumbar Extension with Counter - 2-4 x daily - 1-2 sets - 10 reps - 1 second hold Bridge with Hip Abduction and Resistance - 1 x daily - 1 sets - 20 reps - 5seconds hold  [email or web address] [place] [[id number]]  Lumbar Extension in Standing -  Repeat 10 Times, Hold 2 Seconds, Complete 2 Sets, Perform 5 Times a Day     PT Education - 02/05/21 1304    Education Details therex form/technique, POC    Person(s) Educated Patient    Methods Explanation;Demonstration;Tactile cues;Verbal cues    Comprehension Verbalized understanding;Returned demonstration;Verbal cues required;Tactile cues required;Need further  instruction            PT Short Term Goals - 01/15/21 1514      PT SHORT TERM GOAL #1   Title Be independent with initial home exercise program for self-management of symptoms.    Baseline initial HEP to be established at visit 2 as appropriate (12/26/2020);    Time 3    Period Weeks    Status Achieved    Target Date 01/16/21             PT Long Term Goals - 02/05/21 1313      PT LONG TERM GOAL #1   Title Be independent with a long-term home exercise program for self-management of symptoms.    Baseline initial HEP to be established at visit 2 as appropriate (12/26/2020); currently participating in appropriate HEP (02/05/2021);    Time 12    Period Weeks    Status Partially Met   TARGET DATE FOR ALL LONG TERM GOALS: 03/20/2021     PT LONG TERM GOAL #2   Title Demonstrate improved FOTO score to equal or greater than 69 to demonstrate improvement in overall condition and self-reported functional ability.    Baseline 59 (12/26/2020); 65 (02/05/2021);    Time 12    Period Weeks    Status Partially Met      PT LONG TERM GOAL #3   Title Have full lumbar spine AROM with no compensations or increase in pain in all planes except intermittent end range discomfort to allow patient to complete valued activities with less difficulty.    Baseline painful and limited - see objective exam (12/26/2020); improving slightly - see objective exam (02/05/2021);    Time 12    Period Weeks    Status Partially Met      PT LONG TERM GOAL #4   Title Reduce pain with functional activities to equal or less than 1/10 to allow patient to complete usual activities including ADLs, IADLs, and social engagement with less difficulty.    Baseline 7/10 (12/26/2020); 3/10 (02/05/2021);    Time 12    Period Weeks    Status Partially Met      PT LONG TERM GOAL #5   Title Complete community, work and/or recreational activities without limitation due to current condition.    Baseline limiting ability to complete her usual  activities including shopping, errands, anything she is doing out and about, straightening up, activities at home without difficulty (12/26/2020); much improved - she can go shopping, grocery store, etc without having the sudden debilitating pain, but she has difficulty hiking in the mountains and play at the beach, [place] (02/05/2021);    Time 12    Period  Weeks    Status Partially Met                 Plan - 02/05/21 1904    Clinical Impression Statement Patient has attended 10 physical therapy sessions this episode of care and appears to be making steady progress towards goals. Patient reports she can now go to the grocery store without fear of her back "locking up" and she has been able to get up and down from the tractor and other activities she was not able to do before. Her FOTO score has improved from 59 to 65 demonstrating improvement in self-reported function. She has improved lumbar ROM and decreased pain with functional activity. Continues to have some pain that has not resolved and has difficulty with her usual activities of hiking on mountains, playing at the beach with her family, etc. Continues to respond well to physical intervention today including manual techniques and lumbar extension exercises. Resolved leg pain with lumbar extension. Patient would benefit from continued management of limiting condition by skilled physical therapist to address remaining impairments and functional limitations to work towards stated goals and return to PLOF or maximal functional independence.    Personal Factors and Comorbidities Age;Comorbidity 3+;Time since onset of injury/illness/exacerbation;Past/Current Experience;Fitness    Comorbidities Relevant past medical history and comorbidities include chronic allergic asthma (epi pen in car), acute sinusitis (two surgeries), skin spot removed, hypertension, dental issues, reflux, hx of cardiac catheterization to prove she doesn't have CHF (heart  was healthy), cholecystectomy, osteoarthritis, hx L knee pain (baker's cyst/meniscus).    Examination-Activity Limitations Bend;Locomotion Level;Transfers;Lift;Stand;Carry;Squat;Stairs    Examination-Participation Restrictions Community Activity;Interpersonal Relationship;Laundry;Shop;Driving;[employer] Work   usual activities including shopping, errands, anything she is doing out and about, straightening up, activities at home without difficulty.   Stability/Clinical Decision Making Evolving/Moderate complexity    Rehab Potential Good    PT Frequency 2x / week    PT Duration 12 weeks    PT Treatment/Interventions ADLs/Self Care Home Management;Aquatic Therapy;Cryotherapy;Moist Heat;Electrical Stimulation;Therapeutic activities;Gait training;Therapeutic exercise;Neuromuscular re-education;Patient/family education;Passive range of motion;Dry needling;Spinal Manipulations;Joint Manipulations;Manual techniques    PT Next Visit Plan manual therapy, trunk strengthening, hip strengthening    PT Home Exercise Plan Medbridge Access Code: [email or web address] ; [email or web address]  [id number] Home Exercise Program [phone])    Consulted and Agree with Plan of Care Patient           Patient will benefit from skilled therapeutic intervention in order to improve the following deficits and impairments:  Pain,Decreased coordination,Decreased mobility,Increased muscle spasms,Postural dysfunction,Decreased activity tolerance,Decreased endurance,Decreased range of motion,Decreased strength,Hypomobility,Impaired perceived functional ability,Difficulty walking  Visit Diagnosis: Chronic right-sided low back pain, unspecified whether sciatica present  Difficulty in walking, not elsewhere classified     Problem List Patient Active Problem List   Diagnosis Date Noted  . Low back pain 11/07/2020  . Dysuria 10/24/2020  . Pain, dental 09/29/2020  . Hypercholesterolemia 04/08/2020  . Left knee pain 04/08/2020  . COVID-19 virus infection  10/31/2019  . Stress 09/25/2019  . Joint stiffness of hand 06/26/2018  . Plantar fasciitis 06/26/2018  . Left leg pain 02/21/2018  . Hyperglycemia 02/21/2018  . Visual disturbance 02/20/2016  . Near syncope 02/20/2016  . Right lower quadrant pain 08/04/2015  . Abnormal liver function tests 08/04/2015  . UTI symptoms 03/31/2015  . Health care maintenance 02/03/2015  . Cough 12/21/2014  . History of colonic polyps 05/29/2014  . Leukopenia 07/07/2013  . Asthma 10/10/2012  . Allergic rhinitis 10/10/2012  . GERD (gastroesophageal reflux disease) 10/10/2012  .  Hypertension 10/10/2012    Everlean Alstrom. Graylon Good, PT, DPT 02/05/21, 7:10 PM  [facility] [address], [place], [address] Phone: [phone]   Fax:  [phone]  Name: Chloe Moyer MRN: [id number] Date of Birth: 1950-11-24

## 2021-02-07 ENCOUNTER — Encounter: Payer: Self-pay | Admitting: Physical Therapy

## 2021-02-07 ENCOUNTER — Ambulatory Visit: Payer: PPO | Admitting: Physical Therapy

## 2021-02-07 ENCOUNTER — Other Ambulatory Visit: Payer: Self-pay

## 2021-02-07 DIAGNOSIS — R262 Difficulty in walking, not elsewhere classified: Secondary | ICD-10-CM

## 2021-02-07 DIAGNOSIS — G8929 Other chronic pain: Secondary | ICD-10-CM

## 2021-02-07 DIAGNOSIS — M545 Low back pain, unspecified: Secondary | ICD-10-CM

## 2021-02-07 NOTE — Therapy (Signed)
[facility] [address], [place], [address] Phone: [phone]   Fax:  [phone]  Physical Therapy Treatment  Patient Details  Name: Chloe Moyer MRN: [id number] Date of Birth: Aug 18, 1951 Referring Provider (PT): Einar Pheasant, MD   Encounter Date: 02/07/2021   PT End of Session - 02/07/21 1259    Visit Number 11    Number of Visits 24    Date for PT Re-Evaluation 03/20/21    Authorization Type [employer] reporting period from 02/05/2021    Progress Note Due on Visit 10    PT Start Time 1300    PT Stop Time 1343    PT Time Calculation (min) 43 min    Activity Tolerance Patient limited by pain;Patient tolerated treatment well    Behavior During Therapy [facility] for tasks assessed/performed           Past Medical History:  Diagnosis Date  . Allergic rhinitis   . Arthritis   . Asthma   . Diverticulosis, sigmoid   . GERD (gastroesophageal reflux disease)   . History of hiatal hernia   . Hypertension 06-12-2005  . Ulcer     Past Surgical History:  Procedure Laterality Date  . BREAST BIOPSY Right 09/01/2017   Affirm Bx of 2 areas- both areas were fibroadenomatoid change   . BREAST CYST ASPIRATION Left 1990  . CARDIAC CATHETERIZATION  July 2012  . CHOLECYSTECTOMY  1991  . LIPOMA EXCISION  4/99   Left flank area  . NASAL SINUS SURGERY  July 2012  . TUBAL LIGATION  1984    There were no vitals filed for this visit.   Subjective Assessment - 02/07/21 1300    Subjective Patient reports she feels really good today with no pain upon arrival. Was sore in the low back and right glute this morning but it went away. State she can walk a lot better the day after needling each time. She has been doing her lumbar extension stretches and thinks the ones she did this morning helped with her morning pain.    Pertinent History Patient is a 70 y.o. female who presents to outpatient physical therapy  with a referral for medical diagnosis Right-sided low back pain without sciatica, unspecified chronicity. This patient's chief complaints consist of right sided intermittent low back pain with single instance of radiation to the R posterior thigh with onset in the last 3 months leading to the following functional deficits: disrupts shopping, errands, anything she is doing out and about, difficulty straightening up, delays activities at home Relevant past medical history and comorbidities include chronic allergic asthma (epi pen in car), acute sinusitis (two surgeries), skin spot removed, hypertension, dental issues, reflux, hx of cardiac catheterization to prove she doesn't have CHF (heart was healthy), cholecystectomy, osteoarthritis, hx L knee pain (baker's cyst/meniscus).  Patient denies hx of cancer, stroke, seizures, lung problem (besides asthma), major cardiac events, diabetes, unexplained weight loss, changes in bowel or bladder problems, new onset stumbling or dropping things, low back surgeryor bladder problems, new onset stumbling or dropping things, low back surgery.    Limitations Lifting;Walking;House hold activities;Other (comment)    Diagnostic tests Radiograph report 11/07/2020: "FINDINGS:  Frontal, lateral, and spot lumbosacral lateral images were obtained.  There are 5 non-rib-bearing lumbar type vertebral bodies. 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 There is thoracolumbar levoscoliosis.  There is no fracture or spondylolisthesis. There is moderately  severe disc space narrowing at L3-4 and L4-5  with milder disc space  narrowing at L5-S1. There is facet osteoarthritic change at L5-S1  bilaterally and at L4-5 on the right. IMPRESSION:  Scoliosis with osteoarthritic change at several levels. No fracture  or spondylolisthesis."    Patient Stated Goals "to get to where she is pain free all the time" and "to get limbered up"    Currently in Pain? No/denies            TREATMENT:  Therapeutic exercise:to centralize symptoms and improve ROM, strength, muscular endurance, and activity tolerance required for successful completion of functional activities.  - Treadmill 2 mph up to 2% grade. For improved lower extremity mobility, muscular endurance, and weightbearing activity tolerance; and to induce the analgesic effect of aerobic exercise, stimulate improved joint nutrition, and prepare body structures and systems for following interventions. x 5  Minutes.  - standing lumbar extension with plinth support behindx20, x20, x10 - runner's step up to 9 inch step with unilateral UE support as needed, 2x10 each side (to work towards improving climbing ability).   Ball toss at [employer] (balance, power, proprioception, functional movement):  - forward with small pink theraball x 15 - forward with small yellow 2kg med ball x 10 - sideways twisting with small yellow 2kg med ball while standing on airex pad, 3x10 each side (break after first set to do more lumbar extensions)  - Squats with BUE support focusing on glute activation and proper form with hip hinging, stabilized back, and tibial perpendicular to floor with knees behind toes. 1x11 - hooklying bridge with B hip abduction with BlueTB around knees, 5 second hold, 1x20  Pt required multimodal cuing for proper technique and to facilitate improved neuromuscular control, strength, range of motion, and functional ability resulting in improved performance and form.   Manual therapy:to reduce pain and tissue tension, improve range of motion, neuromodulation, in order to promote improved ability to complete functional activities. - prone STM to lumbothoracicparaspinals R  Modality: (unbilled) Dry needling performed to right lumbar spine to decrease pain and spasms along patient's low back region with patient in prone utilizing (1) dry needle(s) .67m x 79mwith (4) sticks at right lumbar multifidi at  approximately L4,L5, and S1 and (1) stick at R Iliocostalis muscle at approximately L4 level. Patient educated about the risks and benefits from therapy and verbally consents to treatment. Dry needling performed by SaEverlean AlstromSnGraylon GoodT, DPT who is certified in this technique.   HOME EXERCISE PROGRAM Access Code: [email or web address]: [email or web address] Date: 01/31/2021 Prepared by: SaRosita KeaExercises Supine Double Knee to Chest - 1 x daily - 2 sets - 10 reps - 5 seconds hold Supine Lower Trunk Rotation - 1 x daily - 2 sets - 10 reps - 1-5 seconds hold Supine Quadratus Lumborum Stretch - 1 x daily - 2 reps - 30 seconds hold Standing Lumbar Extension with Counter - 2-4 x daily - 1-2 sets - 10 reps - 1 second hold Bridge with Hip Abduction and Resistance - 1 x daily - 1 sets - 20 reps - 5seconds hold [email or web address][[id number]]Lumbar Extension in Standing -  Repeat 10 Times, Hold 2 Seconds, Complete 2 Sets, Perform 5 Times a Day     PT Education - 02/07/21 1304    Education Details therex form/technique,    Person(s) Educated Patient    Methods Explanation;Demonstration;Tactile cues;Verbal cues    Comprehension Verbalized understanding;Returned demonstration;Verbal cues required;Tactile cues required;Need further instruction  PT Short Term Goals - 01/15/21 1514      PT SHORT TERM GOAL #1   Title Be independent with initial home exercise program for self-management of symptoms.    Baseline initial HEP to be established at visit 2 as appropriate (12/26/2020);    Time 3    Period Weeks    Status Achieved    Target Date 01/16/21             PT Long Term Goals - 02/05/21 1313      PT LONG TERM GOAL #1   Title Be independent with a long-term home exercise program for self-management of symptoms.    Baseline initial HEP to be established at visit 2 as appropriate (12/26/2020); currently participating in appropriate HEP  (02/05/2021);    Time 12    Period Weeks    Status Partially Met   TARGET DATE FOR ALL LONG TERM GOALS: 03/20/2021     PT LONG TERM GOAL #2   Title Demonstrate improved FOTO score to equal or greater than 69 to demonstrate improvement in overall condition and self-reported functional ability.    Baseline 59 (12/26/2020); 65 (02/05/2021);    Time 12    Period Weeks    Status Partially Met      PT LONG TERM GOAL #3   Title Have full lumbar spine AROM with no compensations or increase in pain in all planes except intermittent end range discomfort to allow patient to complete valued activities with less difficulty.    Baseline painful and limited - see objective exam (12/26/2020); improving slightly - see objective exam (02/05/2021);    Time 12    Period Weeks    Status Partially Met      PT LONG TERM GOAL #4   Title Reduce pain with functional activities to equal or less than 1/10 to allow patient to complete usual activities including ADLs, IADLs, and social engagement with less difficulty.    Baseline 7/10 (12/26/2020); 3/10 (02/05/2021);    Time 12    Period Weeks    Status Partially Met      PT LONG TERM GOAL #5   Title Complete community, work and/or recreational activities without limitation due to current condition.    Baseline limiting ability to complete her usual activities including shopping, errands, anything she is doing out and about, straightening up, activities at home without difficulty (12/26/2020); much improved - she can go shopping, grocery store, etc without having the sudden debilitating pain, but she has difficulty hiking in the mountains and play at the beach, [place] (02/05/2021);    Time 12    Period Weeks    Status Partially Met                 Plan - 02/07/21 1349    Clinical Impression Statement Patient tolerated treatment well with only slight reproduction of pain at R low back briefly with ball toss exercise that was relieved with repeated extension.  Continues to feel significant relief from needling and requested it again today. Focused on improving functional activity tolerance with activities resembling climbing and beach games patient would like to participate in. Patient would benefit from continued management of limiting condition by skilled physical therapist to address remaining impairments and functional limitations to work towards stated goals and return to PLOF or maximal functional independence.    Personal Factors and Comorbidities Age;Comorbidity 3+;Time since onset of injury/illness/exacerbation;Past/Current Experience;Fitness    Comorbidities Relevant past medical history and comorbidities  include chronic allergic asthma (epi pen in car), acute sinusitis (two surgeries), skin spot removed, hypertension, dental issues, reflux, hx of cardiac catheterization to prove she doesn't have CHF (heart was healthy), cholecystectomy, osteoarthritis, hx L knee pain (baker's cyst/meniscus).    Examination-Activity Limitations Bend;Locomotion Level;Transfers;Lift;Stand;Carry;Squat;Stairs    Examination-Participation Restrictions Community Activity;Interpersonal Relationship;Laundry;Shop;Driving;[employer] Work   usual activities including shopping, errands, anything she is doing out and about, straightening up, activities at home without difficulty.   Stability/Clinical Decision Making Evolving/Moderate complexity    Rehab Potential Good    PT Frequency 2x / week    PT Duration 12 weeks    PT Treatment/Interventions ADLs/Self Care Home Management;Aquatic Therapy;Cryotherapy;Moist Heat;Electrical Stimulation;Therapeutic activities;Gait training;Therapeutic exercise;Neuromuscular re-education;Patient/family education;Passive range of motion;Dry needling;Spinal Manipulations;Joint Manipulations;Manual techniques    PT Next Visit Plan manual therapy, trunk strengthening, hip strengthening    PT Home Exercise Plan Medbridge Access Code: [email or web address] ; [email or web address]   [id number] Home Exercise Program [phone])    Consulted and Agree with Plan of Care Patient           Patient will benefit from skilled therapeutic intervention in order to improve the following deficits and impairments:  Pain,Decreased coordination,Decreased mobility,Increased muscle spasms,Postural dysfunction,Decreased activity tolerance,Decreased endurance,Decreased range of motion,Decreased strength,Hypomobility,Impaired perceived functional ability,Difficulty walking  Visit Diagnosis: Chronic right-sided low back pain, unspecified whether sciatica present  Difficulty in walking, not elsewhere classified     Problem List Patient Active Problem List   Diagnosis Date Noted  . Low back pain 11/07/2020  . Dysuria 10/24/2020  . Pain, dental 09/29/2020  . Hypercholesterolemia 04/08/2020  . Left knee pain 04/08/2020  . COVID-19 virus infection 10/31/2019  . Stress 09/25/2019  . Joint stiffness of hand 06/26/2018  . Plantar fasciitis 06/26/2018  . Left leg pain 02/21/2018  . Hyperglycemia 02/21/2018  . Visual disturbance 02/20/2016  . Near syncope 02/20/2016  . Right lower quadrant pain 08/04/2015  . Abnormal liver function tests 08/04/2015  . UTI symptoms 03/31/2015  . Health care maintenance 02/03/2015  . Cough 12/21/2014  . History of colonic polyps 05/29/2014  . Leukopenia 07/07/2013  . Asthma 10/10/2012  . Allergic rhinitis 10/10/2012  . GERD (gastroesophageal reflux disease) 10/10/2012  . Hypertension 10/10/2012    Everlean Alstrom. Graylon Good, PT, DPT 02/07/21, 1:50 PM  [facility] [address], [place], [address] Phone: [phone]   Fax:  [phone]  Name: Chloe Moyer MRN: [id number] Date of Birth: 1950/12/14

## 2021-02-12 ENCOUNTER — Other Ambulatory Visit: Payer: Self-pay | Admitting: Internal Medicine

## 2021-02-12 ENCOUNTER — Encounter: Payer: Self-pay | Admitting: Physical Therapy

## 2021-02-12 ENCOUNTER — Other Ambulatory Visit: Payer: Self-pay

## 2021-02-12 ENCOUNTER — Ambulatory Visit: Payer: PPO | Admitting: Physical Therapy

## 2021-02-12 DIAGNOSIS — G8929 Other chronic pain: Secondary | ICD-10-CM

## 2021-02-12 DIAGNOSIS — R262 Difficulty in walking, not elsewhere classified: Secondary | ICD-10-CM

## 2021-02-12 DIAGNOSIS — M545 Low back pain, unspecified: Secondary | ICD-10-CM | POA: Diagnosis not present

## 2021-02-12 NOTE — Therapy (Signed)
[facility] [address], [place], [address] Phone: [phone]   Fax:  [phone]  Physical Therapy Treatment  Patient Details  Name: Chloe Moyer MRN: [id number] Date of Birth: [birth year] Referring Provider (PT): Einar Pheasant, MD   Encounter Date: 02/12/2021   PT End of Session - 02/12/21 1308    Visit Number 12    Number of Visits 24    Date for PT Re-Evaluation 03/20/21    Authorization Type [employer] reporting period from 02/05/2021    Progress Note Due on Visit 10    PT Start Time 1301    PT Stop Time 1345    PT Time Calculation (min) 44 min    Activity Tolerance Patient limited by pain;Patient tolerated treatment well    Behavior During Therapy [facility] for tasks assessed/performed           Past Medical History:  Diagnosis Date  . Allergic rhinitis   . Arthritis   . Asthma   . Diverticulosis, sigmoid   . GERD (gastroesophageal reflux disease)   . History of hiatal hernia   . Hypertension 06-12-2005  . Ulcer     Past Surgical History:  Procedure Laterality Date  . BREAST BIOPSY Right 09/01/2017   Affirm Bx of 2 areas- both areas were fibroadenomatoid change   . BREAST CYST ASPIRATION Left 1990  . CARDIAC CATHETERIZATION  July 2012  . CHOLECYSTECTOMY  1991  . LIPOMA EXCISION  4/99   Left flank area  . NASAL SINUS SURGERY  July 2012  . TUBAL LIGATION  1984    There were no vitals filed for this visit.   Subjective Assessment - 02/12/21 1305    Subjective Pateint reports she was pretty sore on Friday after her last PT session (thursday). Did feel some increased pain in her low back and right leg, but it has resolved by today. States last time she had pain was Saturday. She has an extended family member who is in the hospital with some serious findings and is waiting to hear news.    Pertinent History Patient is a 70 y.o. female who presents to outpatient physical therapy  with a referral for medical diagnosis Right-sided low back pain without sciatica, unspecified chronicity. This patient's chief complaints consist of right sided intermittent low back pain with single instance of radiation to the R posterior thigh with onset in the last 3 months leading to the following functional deficits: disrupts shopping, errands, anything she is doing out and about, difficulty straightening up, delays activities at home Relevant past medical history and comorbidities include chronic allergic asthma (epi pen in car), acute sinusitis (two surgeries), skin spot removed, hypertension, dental issues, reflux, hx of cardiac catheterization to prove she doesn't have CHF (heart was healthy), cholecystectomy, osteoarthritis, hx L knee pain (baker's cyst/meniscus).  Patient denies hx of cancer, stroke, seizures, lung problem (besides asthma), major cardiac events, diabetes, unexplained weight loss, changes in bowel or bladder problems, new onset stumbling or dropping things, low back surgeryor bladder problems, new onset stumbling or dropping things, low back surgery.    Limitations Lifting;Walking;House hold activities;Other (comment)    Diagnostic tests Radiograph report 11/07/2020: "FINDINGS:  Frontal, lateral, and spot lumbosacral lateral images were obtained.  There are 5 non-rib-bearing lumbar type vertebral bodies. 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 There is thoracolumbar levoscoliosis.  There is no fracture or spondylolisthesis. There is moderately  severe disc space narrowing at L3-4 and L4-5  with milder disc space  narrowing at L5-S1. There is facet osteoarthritic change at L5-S1  bilaterally and at L4-5 on the right. IMPRESSION:  Scoliosis with osteoarthritic change at several levels. No fracture  or spondylolisthesis."    Patient Stated Goals "to get to where she is pain free all the time" and "to get limbered up"    Currently in Pain? No/denies            TREATMENT:  Therapeutic exercise:to centralize symptoms and improve ROM, strength, muscular endurance, and activity tolerance required for successful completion of functional activities.  - Treadmill 2 mph up to 2% grade. For improved lower extremity mobility, muscular endurance, and weightbearing activity tolerance; and to induce the analgesic effect of aerobic exercise, stimulate improved joint nutrition, and prepare body structures and systems for following interventions. x 5  Minutes.  - standing lumbar extension with plinth support behindx25, x20  - runner's step up to 9 inch step with unilateral UE support as needed, 2x10 each side (to work towards improving climbing ability). - Squats with BUE support focusing on glute activation and proper form with hip hinging, stabilized back, and tibial perpendicular to floor with knees behind toes. 2x10/9  Ball toss at [employer] (balance, power, proprioception, functional movement):  - forward with small yellow 2kg med ball x 20 - sideways twisting with small yellow 2kg med ball while standing on airex pad, 1x10, 1x20 each side (break after first set to do more lumbar extensions)  - Step up and over (switching feet at top) large airex leading with one leg, 1x10 each side. CGA for safety.  - double leg hop with BUE support, 1x10  Pt required multimodal cuing for proper technique and to facilitate improved neuromuscular control, strength, range of motion, and functional ability resulting in improved performance and form.   Manual therapy:to reduce pain and tissue tension, improve range of motion, neuromodulation, in order to promote improved ability to complete functional activities. - prone STM to lumbothoracicparaspinalsR  Modality: (unbilled) Dry needling performed toright lumbar spineto decrease pain and spasms along patient'slow backregion with patient in proneutilizing (1) dry needle(s) .44m x775mwith (4) sticks  atright lumbar multifidi at approximately L4,L5, and S1 and (1) stick at R Iliocostalis muscle at approximately L4 level. Patient educated about the risks and benefits from therapy and verbally consents to treatment. Dry needling performed by SaEverlean AlstromSnGraylon GoodT, DPT who is certified in this technique.   HOME EXERCISE PROGRAM Access Code: [email or web address]: [email or web address] Date: 01/31/2021 Prepared by: SaRosita KeaExercises Supine Double Knee to Chest - 1 x daily - 2 sets - 10 reps - 5 seconds hold Supine Lower Trunk Rotation - 1 x daily - 2 sets - 10 reps - 1-5 seconds hold Supine Quadratus Lumborum Stretch - 1 x daily - 2 reps - 30 seconds hold Standing Lumbar Extension with Counter - 2-4 x daily - 1-2 sets - 10 reps - 1 second hold Bridge with Hip Abduction and Resistance - 1 x daily - 1 sets - 20 reps - 5seconds hold [email or web address][[id number]]Lumbar Extension in Standing - Repeat 10 Times, Hold 2 Seconds, Complete 2 Sets, Perform 5 Times a Day   PT Education - 02/12/21 1307    Education Details therex form/technique,    Person(s) Educated Patient    Methods Explanation;Demonstration;Tactile cues;Verbal cues    Comprehension Verbalized understanding;Returned demonstration;Verbal cues required;Tactile cues required;Need further instruction  PT Short Term Goals - 01/15/21 1514      PT SHORT TERM GOAL #1   Title Be independent with initial home exercise program for self-management of symptoms.    Baseline initial HEP to be established at visit 2 as appropriate (12/26/2020);    Time 3    Period Weeks    Status Achieved    Target Date 01/16/21             PT Long Term Goals - 02/05/21 1313      PT LONG TERM GOAL #1   Title Be independent with a long-term home exercise program for self-management of symptoms.    Baseline initial HEP to be established at visit 2 as appropriate (12/26/2020); currently participating in  appropriate HEP (02/05/2021);    Time 12    Period Weeks    Status Partially Met   TARGET DATE FOR ALL LONG TERM GOALS: 03/20/2021     PT LONG TERM GOAL #2   Title Demonstrate improved FOTO score to equal or greater than 69 to demonstrate improvement in overall condition and self-reported functional ability.    Baseline 59 (12/26/2020); 65 (02/05/2021);    Time 12    Period Weeks    Status Partially Met      PT LONG TERM GOAL #3   Title Have full lumbar spine AROM with no compensations or increase in pain in all planes except intermittent end range discomfort to allow patient to complete valued activities with less difficulty.    Baseline painful and limited - see objective exam (12/26/2020); improving slightly - see objective exam (02/05/2021);    Time 12    Period Weeks    Status Partially Met      PT LONG TERM GOAL #4   Title Reduce pain with functional activities to equal or less than 1/10 to allow patient to complete usual activities including ADLs, IADLs, and social engagement with less difficulty.    Baseline 7/10 (12/26/2020); 3/10 (02/05/2021);    Time 12    Period Weeks    Status Partially Met      PT LONG TERM GOAL #5   Title Complete community, work and/or recreational activities without limitation due to current condition.    Baseline limiting ability to complete her usual activities including shopping, errands, anything she is doing out and about, straightening up, activities at home without difficulty (12/26/2020); much improved - she can go shopping, grocery store, etc without having the sudden debilitating pain, but she has difficulty hiking in the mountains and play at the beach, [place] (02/05/2021);    Time 12    Period Weeks    Status Partially Met                 Plan - 02/12/21 1353    Clinical Impression Statement Patient tolerated treatment well overall and continues to make progress towards goals. Continued to work on improved LE strength and activity  tolerance to improve ability to hike and play at the beach. Patient fatigued with squats especially with difficulty pushing up from last rep. Continues to report significant improvement in walking comfort the day after needling. Patient would benefit from continued management of limiting condition by skilled physical therapist to address remaining impairments and functional limitations to work towards stated goals and return to PLOF or maximal functional independence.    Personal Factors and Comorbidities Age;Comorbidity 3+;Time since onset of injury/illness/exacerbation;Past/Current Experience;Fitness    Comorbidities Relevant past medical history and comorbidities  include chronic allergic asthma (epi pen in car), acute sinusitis (two surgeries), skin spot removed, hypertension, dental issues, reflux, hx of cardiac catheterization to prove she doesn't have CHF (heart was healthy), cholecystectomy, osteoarthritis, hx L knee pain (baker's cyst/meniscus).    Examination-Activity Limitations Bend;Locomotion Level;Transfers;Lift;Stand;Carry;Squat;Stairs    Examination-Participation Restrictions Community Activity;Interpersonal Relationship;Laundry;Shop;Driving;[employer] Work   usual activities including shopping, errands, anything she is doing out and about, straightening up, activities at home without difficulty.   Stability/Clinical Decision Making Evolving/Moderate complexity    Rehab Potential Good    PT Frequency 2x / week    PT Duration 12 weeks    PT Treatment/Interventions ADLs/Self Care Home Management;Aquatic Therapy;Cryotherapy;Moist Heat;Electrical Stimulation;Therapeutic activities;Gait training;Therapeutic exercise;Neuromuscular re-education;Patient/family education;Passive range of motion;Dry needling;Spinal Manipulations;Joint Manipulations;Manual techniques    PT Next Visit Plan manual therapy, trunk strengthening, hip strengthening    PT Home Exercise Plan Medbridge Access Code: [email or web address] ;  [email or web address]  [id number] Home Exercise Program [phone])    Consulted and Agree with Plan of Care Patient           Patient will benefit from skilled therapeutic intervention in order to improve the following deficits and impairments:  Pain,Decreased coordination,Decreased mobility,Increased muscle spasms,Postural dysfunction,Decreased activity tolerance,Decreased endurance,Decreased range of motion,Decreased strength,Hypomobility,Impaired perceived functional ability,Difficulty walking  Visit Diagnosis: Chronic right-sided low back pain, unspecified whether sciatica present  Difficulty in walking, not elsewhere classified     Problem List Patient Active Problem List   Diagnosis Date Noted  . Low back pain 11/07/2020  . Dysuria 10/24/2020  . Pain, dental 09/29/2020  . Hypercholesterolemia 04/08/2020  . Left knee pain 04/08/2020  . COVID-19 virus infection 10/31/2019  . Stress 09/25/2019  . Joint stiffness of hand 06/26/2018  . Plantar fasciitis 06/26/2018  . Left leg pain 02/21/2018  . Hyperglycemia 02/21/2018  . Visual disturbance 02/20/2016  . Near syncope 02/20/2016  . Right lower quadrant pain 08/04/2015  . Abnormal liver function tests 08/04/2015  . UTI symptoms 03/31/2015  . Health care maintenance 02/03/2015  . Cough 12/21/2014  . History of colonic polyps 05/29/2014  . Leukopenia 07/07/2013  . Asthma 10/10/2012  . Allergic rhinitis 10/10/2012  . GERD (gastroesophageal reflux disease) 10/10/2012  . Hypertension 10/10/2012    Everlean Alstrom. Graylon Good, PT, DPT 02/12/21, 1:55 PM   [facility] [address], [place], [address] Phone: [phone]   Fax:  [phone]  Name: Chloe Moyer MRN: [id number] Date of Birth: 03/02/1951

## 2021-02-13 ENCOUNTER — Telehealth: Payer: Self-pay | Admitting: Internal Medicine

## 2021-02-13 DIAGNOSIS — L570 Actinic keratosis: Secondary | ICD-10-CM | POA: Diagnosis not present

## 2021-02-13 NOTE — Telephone Encounter (Signed)
Last colonoscopy 04/2014.  Recommended f/u in 5 years.  Had discussed with her previously.  Can see if she is agreeable now.  If not, let us know when agreeable.

## 2021-02-14 ENCOUNTER — Other Ambulatory Visit: Payer: Self-pay

## 2021-02-14 ENCOUNTER — Ambulatory Visit: Payer: PPO | Admitting: Physical Therapy

## 2021-02-14 ENCOUNTER — Encounter: Payer: Self-pay | Admitting: Physical Therapy

## 2021-02-14 DIAGNOSIS — G8929 Other chronic pain: Secondary | ICD-10-CM

## 2021-02-14 DIAGNOSIS — R262 Difficulty in walking, not elsewhere classified: Secondary | ICD-10-CM

## 2021-02-14 DIAGNOSIS — M545 Low back pain, unspecified: Secondary | ICD-10-CM

## 2021-02-14 NOTE — Telephone Encounter (Signed)
LMTCB

## 2021-02-14 NOTE — Therapy (Signed)
[facility] [address] [place], [address] Phone: [phone]   Fax:  [phone]  Physical Therapy Treatment  Patient Details  Name: Chloe Moyer MRN: [id number] Date of Birth: 10-Apr-1951 Referring Provider (PT): Einar Pheasant, MD   Encounter Date: 02/14/2021   PT End of Session - 02/14/21 1302    Visit Number 13    Number of Visits 24    Date for PT Re-Evaluation 03/20/21    Authorization Type [employer] reporting period from 02/05/2021    Progress Note Due on Visit 10    PT Start Time 1259    PT Stop Time 1345    PT Time Calculation (min) 46 min    Activity Tolerance Patient limited by pain;Patient tolerated treatment well    Behavior During Therapy [facility] for tasks assessed/performed           Past Medical History:  Diagnosis Date  . Allergic rhinitis   . Arthritis   . Asthma   . Diverticulosis, sigmoid   . GERD (gastroesophageal reflux disease)   . History of hiatal hernia   . Hypertension 06-12-2005  . Ulcer     Past Surgical History:  Procedure Laterality Date  . BREAST BIOPSY Right 09/01/2017   Affirm Bx of 2 areas- both areas were fibroadenomatoid change   . BREAST CYST ASPIRATION Left 1990  . CARDIAC CATHETERIZATION  July 2012  . CHOLECYSTECTOMY  1991  . LIPOMA EXCISION  4/99   Left flank area  . NASAL SINUS SURGERY  July 2012  . TUBAL LIGATION  1984    There were no vitals filed for this visit.   Subjective Assessment - 02/14/21 1259    Subjective Patient reports she is "sore as all get out" in her lwo back and lower legs. She did have a breif flair up of back pain yesterday when she got up and she did some peddling on the stationary bike and did some walking and it felt better. She has not had this happen in several weeks. States she can feel some pain in the right low back 1/10 and feels the rest of her is just sore.    Pertinent History Patient is a 70 y.o.  female who presents to outpatient physical therapy with a referral for medical diagnosis Right-sided low back pain without sciatica, unspecified chronicity. This patient's chief complaints consist of right sided intermittent low back pain with single instance of radiation to the R posterior thigh with onset in the last 3 months leading to the following functional deficits: disrupts shopping, errands, anything she is doing out and about, difficulty straightening up, delays activities at home Relevant past medical history and comorbidities include chronic allergic asthma (epi pen in car), acute sinusitis (two surgeries), skin spot removed, hypertension, dental issues, reflux, hx of cardiac catheterization to prove she doesn't have CHF (heart was healthy), cholecystectomy, osteoarthritis, hx L knee pain (baker's cyst/meniscus).  Patient denies hx of cancer, stroke, seizures, lung problem (besides asthma), major cardiac events, diabetes, unexplained weight loss, changes in bowel or bladder problems, new onset stumbling or dropping things, low back surgeryor bladder problems, new onset stumbling or dropping things, low back surgery.    Limitations Lifting;Walking;House hold activities;Other (comment)    Diagnostic tests Radiograph report 11/07/2020: "FINDINGS:  Frontal, lateral, and spot lumbosacral lateral images were obtained.  There are 5 non-rib-bearing lumbar type vertebral bodies. 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 There is thoracolumbar levoscoliosis.  There  is no fracture or spondylolisthesis. There is moderately  severe disc space narrowing at L3-4 and L4-5 with milder disc space  narrowing at L5-S1. There is facet osteoarthritic change at L5-S1  bilaterally and at L4-5 on the right. IMPRESSION:  Scoliosis with osteoarthritic change at several levels. No fracture  or spondylolisthesis."    Patient Stated Goals "to get to where she is pain free all the time" and "to get limbered up"    Currently in Pain? Yes     Pain Score 1            TREATMENT:  Therapeutic exercise:to centralize symptoms and improve ROM, strength, muscular endurance, and activity tolerance required for successful completion of functional activities.  - standing lumbar extension with plinth support behindx25, x20, x10, x20 -Treadmill2.2 mph up to 2% grade.For improved lower extremity mobility, muscular endurance, and weightbearing activity tolerance; and to induce the analgesic effect of aerobic exercise, stimulate improved joint nutrition, and prepare body structures and systems for following interventions. x 5Minutes. - runner's step up to 9 inch step with unilateral UE support as needed,2x10 each side (to work towards improving climbing ability). -Squats with BUE support focusing on glute activation and proper form with hip hinging, stabilized back, and tibial perpendicular to floor with knees behind toes.2x10. - jumping jacks (legs only) 1x10, 2x5.  Ball toss at [employer] (balance, power, proprioception, functional movement):  - sideways twisting with small yellow 2kg med ball while standing on airex pad, 2x20 each side  - Step up and over (switching feet at top) large dynadisc leading with one leg, 1x10 each side. CGA for safety.  - Education on HEP including handout for next week when she is away from PT sessions.   Pt required multimodal cuing for proper technique and to facilitate improved neuromuscular control, strength, range of motion, and functional ability resulting in improved performance and form.   Manual therapy:to reduce pain and tissue tension, improve range of motion, neuromodulation, in order to promote improved ability to complete functional activities. - prone STM to lumbothoracicparaspinalsR  Modality: (unbilled) Dry needling performed toright lumbar spineto decrease pain and spasms along patient'slow backregion with patient in proneutilizing (2) dry needle(s) .13m x721m with (2) sticks atright lumbar multifidi at approximately L4,L5 and (1) stick at R Iliocostalis muscle at approximately L4 level. Patient educated about the risks and benefits from therapy and verbally consents to treatment. Dry needling performed by SaEverlean AlstromSnGraylon GoodT, DPT who is certified in this technique.   HOME EXERCISE PROGRAM Access Code: [email or web address]: [email or web address] Date: 02/14/2021 Prepared by: SaRosita KeaExercises Supine Double Knee to Chest - 1 x daily - 2 sets - 10 reps - 5 seconds hold Supine Lower Trunk Rotation - 1 x daily - 2 sets - 10 reps - 1-5 seconds hold Supine Quadratus Lumborum Stretch - 1 x daily - 2 reps - 30 seconds hold Standing Lumbar Extension with Counter - 2-4 x daily - 1-2 sets - 10 reps - 1 second hold Bridge with Hip Abduction and Resistance - 1 x daily - 1 sets - 20 reps - 5seconds hold Seated Posture with Lumbar Roll Runner's Step Up/Down - 2-3 x weekly - 2 sets - 10 reps Squat with Counter Support - 2-3 x weekly - 1 sets - 10 reps Jumping Jacks - 2-3 x weekly - 2 sets - 10 reps   [email or web address] [id number] Exercise Program [[id number]]Lumbar Extension in Standing - Repeat 10 Times, Hold 2 Seconds, Complete  2 Sets, Perform 5 Times a Day    PT Education - 02/14/21 1302    Education Details therex form/technique,    Person(s) Educated Patient    Methods Explanation;Demonstration;Tactile cues;Verbal cues    Comprehension Verbalized understanding;Returned demonstration;Verbal cues required;Tactile cues required;Need further instruction            PT Short Term Goals - 01/15/21 1514      PT SHORT TERM GOAL #1   Title Be independent with initial home exercise program for self-management of symptoms.    Baseline initial HEP to be established at visit 2 as appropriate (12/26/2020);    Time 3    Period Weeks    Status Achieved    Target Date 01/16/21             PT Long Term Goals - 02/05/21 1313      PT LONG  TERM GOAL #1   Title Be independent with a long-term home exercise program for self-management of symptoms.    Baseline initial HEP to be established at visit 2 as appropriate (12/26/2020); currently participating in appropriate HEP (02/05/2021);    Time 12    Period Weeks    Status Partially Met   TARGET DATE FOR ALL LONG TERM GOALS: 03/20/2021     PT LONG TERM GOAL #2   Title Demonstrate improved FOTO score to equal or greater than 69 to demonstrate improvement in overall condition and self-reported functional ability.    Baseline 59 (12/26/2020); 65 (02/05/2021);    Time 12    Period Weeks    Status Partially Met      PT LONG TERM GOAL #3   Title Have full lumbar spine AROM with no compensations or increase in pain in all planes except intermittent end range discomfort to allow patient to complete valued activities with less difficulty.    Baseline painful and limited - see objective exam (12/26/2020); improving slightly - see objective exam (02/05/2021);    Time 12    Period Weeks    Status Partially Met      PT LONG TERM GOAL #4   Title Reduce pain with functional activities to equal or less than 1/10 to allow patient to complete usual activities including ADLs, IADLs, and social engagement with less difficulty.    Baseline 7/10 (12/26/2020); 3/10 (02/05/2021);    Time 12    Period Weeks    Status Partially Met      PT LONG TERM GOAL #5   Title Complete community, work and/or recreational activities without limitation due to current condition.    Baseline limiting ability to complete her usual activities including shopping, errands, anything she is doing out and about, straightening up, activities at home without difficulty (12/26/2020); much improved - she can go shopping, grocery store, etc without having the sudden debilitating pain, but she has difficulty hiking in the mountains and play at the beach, [place] (02/05/2021);    Time 12    Period Weeks    Status Partially Met                  Plan - 02/14/21 1512    Clinical Impression Statement Patient tolerated treatment well despite being sore from prior exercises. Continued to use repeated extension and dry needling to control and prevent pain. Continue to focus on improving functional activity tolerance to complete activities that patient feels she is missing out on. Updated HEP to include a way for her to continue to work  on more demanding exercises while away at the beach next week. Patient would benefit from continued management of limiting condition by skilled physical therapist to address remaining impairments and functional limitations to work towards stated goals and return to PLOF or maximal functional independence.    Personal Factors and Comorbidities Age;Comorbidity 3+;Time since onset of injury/illness/exacerbation;Past/Current Experience;Fitness    Comorbidities Relevant past medical history and comorbidities include chronic allergic asthma (epi pen in car), acute sinusitis (two surgeries), skin spot removed, hypertension, dental issues, reflux, hx of cardiac catheterization to prove she doesn't have CHF (heart was healthy), cholecystectomy, osteoarthritis, hx L knee pain (baker's cyst/meniscus).    Examination-Activity Limitations Bend;Locomotion Level;Transfers;Lift;Stand;Carry;Squat;Stairs    Examination-Participation Restrictions Community Activity;Interpersonal Relationship;Laundry;Shop;Driving;[employer] Work   usual activities including shopping, errands, anything she is doing out and about, straightening up, activities at home without difficulty.   Stability/Clinical Decision Making Evolving/Moderate complexity    Rehab Potential Good    PT Frequency 2x / week    PT Duration 12 weeks    PT Treatment/Interventions ADLs/Self Care Home Management;Aquatic Therapy;Cryotherapy;Moist Heat;Electrical Stimulation;Therapeutic activities;Gait training;Therapeutic exercise;Neuromuscular re-education;Patient/family  education;Passive range of motion;Dry needling;Spinal Manipulations;Joint Manipulations;Manual techniques    PT Next Visit Plan manual therapy, trunk strengthening, hip strengthening    PT Home Exercise Plan Medbridge Access Code: [email or web address] ; [email or web address]  [id number] Home Exercise Program [phone])    Consulted and Agree with Plan of Care Patient           Patient will benefit from skilled therapeutic intervention in order to improve the following deficits and impairments:  Pain,Decreased coordination,Decreased mobility,Increased muscle spasms,Postural dysfunction,Decreased activity tolerance,Decreased endurance,Decreased range of motion,Decreased strength,Hypomobility,Impaired perceived functional ability,Difficulty walking  Visit Diagnosis: Chronic right-sided low back pain, unspecified whether sciatica present  Difficulty in walking, not elsewhere classified     Problem List Patient Active Problem List   Diagnosis Date Noted  . Low back pain 11/07/2020  . Dysuria 10/24/2020  . Pain, dental 09/29/2020  . Hypercholesterolemia 04/08/2020  . Left knee pain 04/08/2020  . COVID-19 virus infection 10/31/2019  . Stress 09/25/2019  . Joint stiffness of hand 06/26/2018  . Plantar fasciitis 06/26/2018  . Left leg pain 02/21/2018  . Hyperglycemia 02/21/2018  . Visual disturbance 02/20/2016  . Near syncope 02/20/2016  . Right lower quadrant pain 08/04/2015  . Abnormal liver function tests 08/04/2015  . UTI symptoms 03/31/2015  . Health care maintenance 02/03/2015  . Cough 12/21/2014  . History of colonic polyps 05/29/2014  . Leukopenia 07/07/2013  . Asthma 10/10/2012  . Allergic rhinitis 10/10/2012  . GERD (gastroesophageal reflux disease) 10/10/2012  . Hypertension 10/10/2012    Everlean Alstrom. Graylon Good, PT, DPT 02/14/21, 3:13 PM  [facility] [address] [place], [address] Phone: [phone]   Fax:   [phone]  Name: Chloe Moyer MRN: [id number] Date of Birth: 1951-05-09

## 2021-02-14 NOTE — Telephone Encounter (Signed)
Patient would like to wait and have this done closer to fall of 2022

## 2021-02-26 ENCOUNTER — Encounter: Payer: Self-pay | Admitting: Physical Therapy

## 2021-02-26 ENCOUNTER — Other Ambulatory Visit: Payer: Self-pay

## 2021-02-26 ENCOUNTER — Ambulatory Visit: Payer: PPO | Attending: Internal Medicine | Admitting: Physical Therapy

## 2021-02-26 DIAGNOSIS — M545 Low back pain, unspecified: Secondary | ICD-10-CM | POA: Insufficient documentation

## 2021-02-26 DIAGNOSIS — R262 Difficulty in walking, not elsewhere classified: Secondary | ICD-10-CM | POA: Diagnosis not present

## 2021-02-26 DIAGNOSIS — G8929 Other chronic pain: Secondary | ICD-10-CM | POA: Diagnosis not present

## 2021-02-26 NOTE — Therapy (Signed)
[facility] [address] [place], [address] Phone: [phone]   Fax:  [phone]  Physical Therapy Treatment  Patient Details  Name: Chloe Moyer MRN: [id number] Date of Birth: 22-Mar-1951 Referring Provider (PT): Einar Pheasant, MD   Encounter Date: 02/26/2021   PT End of Session - 02/26/21 1317    Visit Number 14    Number of Visits 24    Date for PT Re-Evaluation 03/20/21    Authorization Type [employer] reporting period from 02/05/2021    Progress Note Due on Visit 10    PT Start Time 1300    PT Stop Time 1355    PT Time Calculation (min) 55 min    Activity Tolerance Patient limited by pain;Patient tolerated treatment well    Behavior During Therapy [facility] for tasks assessed/performed           Past Medical History:  Diagnosis Date  . Allergic rhinitis   . Arthritis   . Asthma   . Diverticulosis, sigmoid   . GERD (gastroesophageal reflux disease)   . History of hiatal hernia   . Hypertension 06-12-2005  . Ulcer     Past Surgical History:  Procedure Laterality Date  . BREAST BIOPSY Right 09/01/2017   Affirm Bx of 2 areas- both areas were fibroadenomatoid change   . BREAST CYST ASPIRATION Left 1990  . CARDIAC CATHETERIZATION  July 2012  . CHOLECYSTECTOMY  1991  . LIPOMA EXCISION  4/99   Left flank area  . NASAL SINUS SURGERY  July 2012  . TUBAL LIGATION  1984    There were no vitals filed for this visit.   Subjective Assessment - 02/26/21 1302    Subjective Patient reports she has no pain upon arrival. States she hiked on a trail while at the beach and did pretty good. She didn't do the hardest part of the trail. Her knees are bothering her and she thinks this may be from all the stairs she had to walk on at the beach. She states she is tired after carrying a sprayer to spray the weeds this morning. HEP went really well while away at the beach. Did have to come home early due  to a death in the family. Pain in the knees is 2/10 and started after climbing all the steps. Seems to be getting worse.    Pertinent History Patient is a 70 y.o. female who presents to outpatient physical therapy with a referral for medical diagnosis Right-sided low back pain without sciatica, unspecified chronicity. This patient's chief complaints consist of right sided intermittent low back pain with single instance of radiation to the R posterior thigh with onset in the last 3 months leading to the following functional deficits: disrupts shopping, errands, anything she is doing out and about, difficulty straightening up, delays activities at home Relevant past medical history and comorbidities include chronic allergic asthma (epi pen in car), acute sinusitis (two surgeries), skin spot removed, hypertension, dental issues, reflux, hx of cardiac catheterization to prove she doesn't have CHF (heart was healthy), cholecystectomy, osteoarthritis, hx L knee pain (baker's cyst/meniscus).  Patient denies hx of cancer, stroke, seizures, lung problem (besides asthma), major cardiac events, diabetes, unexplained weight loss, changes in bowel or bladder problems, new onset stumbling or dropping things, low back surgeryor bladder problems, new onset stumbling or dropping things, low back surgery.    Limitations Lifting;Walking;House hold activities;Other (comment)    Diagnostic tests Radiograph report 11/07/2020: "FINDINGS:  Frontal, lateral, and spot lumbosacral lateral images were obtained.  There are 5 non-rib-bearing lumbar type vertebral bodies. 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 There is thoracolumbar levoscoliosis.  There is no fracture or spondylolisthesis. There is moderately  severe disc space narrowing at L3-4 and L4-5 with milder disc space  narrowing at L5-S1. There is facet osteoarthritic change at L5-S1  bilaterally and at L4-5 on the right. IMPRESSION:  Scoliosis with osteoarthritic change at several  levels. No fracture  or spondylolisthesis."    Patient Stated Goals "to get to where she is pain free all the time" and "to get limbered up"    Currently in Pain? No/denies          OBJECTIVE FOTO = 63 (02/26/2021)   TREATMENT:  Therapeutic exercise:to centralize symptoms and improve ROM, strength, muscular endurance, and activity tolerance required for successful completion of functional activities.  -Squats with BUE support focusing on glute activation and proper form with hip hinging, stabilized back, and tibial perpendicular to floor with knees behind toes.1x20 (improved ability without increased pain).  - single leg bridge, 2x10 each side (as an alternative to runner's step up).  - standing lumbar extension with plinth support behindx30, x20, x25 (interspersed throughout session).  - standing glute pull through, green theraband loop, 2x20 plus more reps to learn motion. Cuing for hinge, form, etc.   Ball toss at [employer] (balance, power, proprioception, functional movement):  - sideways twisting with small yellow 2/3kg med ball while standing on airex pad,2x20each side (stopped to stretch back between sets).   - Step up and over (switching feet at top) large dynadisc leading with one leg, 1x10 each side. CGA for safety.  - jumping jacks (legs/arms) 2x10  - Education on HEP including handout with single leg bridge  Pt required multimodal cuing for proper technique and to facilitate improved neuromuscular control, strength, range of motion, and functional ability resulting in improved performance and form.   Manual therapy:to reduce pain and tissue tension, improve range of motion, neuromodulation, in order to promote improved ability to complete functional activities. - prone STM to lumbothoracicparaspinalsR - prone UPA to R lowest lumbar segments, grade III-IV  Modality: (unbilled) Dry needling performed toright lumbar spineto decrease pain and spasms along  patient'slow backregion with patient in proneutilizing (1) dry needle(s) .52m x736mwith (2) sticks atright lumbar multifidi at approximately L4,L5 and (1) stick at R Iliocostalis muscle at approximately L4 level. Patient educated about the risks and benefits from therapy and verbally consents to treatment. Dry needling performed by SaEverlean AlstromSnGraylon GoodT, DPT who is certified in this technique.   HOME EXERCISE PROGRAM Access Code: [email or web address]: [email or web address] Date: 02/26/2021 Prepared by: SaRosita KeaExercises Supine Double Knee to Chest - 1 x daily - 2 sets - 10 reps - 5 seconds hold Supine Lower Trunk Rotation - 1 x daily - 2 sets - 10 reps - 1-5 seconds hold Supine Quadratus Lumborum Stretch - 1 x daily - 2 reps - 30 seconds hold Standing Lumbar Extension with Counter - 2-4 x daily - 1-2 sets - 10 reps - 1 second hold Bridge with Hip Abduction and Resistance - 1 x daily - 1 sets - 20 reps - 5seconds hold Seated Posture with Lumbar Roll Runner's Step Up/Down - 2-3 x weekly - 2 sets - 10 reps Single Leg Bridge - 2-3 x weekly - 2 sets - 10 reps - 2-3 seconds hold Squat with Counter Support - 2-3 x weekly - 1  sets - 10 reps Jumping Jacks - 2-3 x weekly - 2 sets - 10 reps  [email or web address] [place] [[id number]]  Lumbar Extension in Standing - Repeat 10 Times, Hold 2 Seconds, Complete 2 Sets, Perform 5 Times a Day     PT Education - 02/26/21 1317    Education Details therex form/technique,    Person(s) Educated Patient    Methods Explanation;Demonstration;Tactile cues;Verbal cues    Comprehension Verbalized understanding;Returned demonstration;Verbal cues required;Tactile cues required;Need further instruction            PT Short Term Goals - 01/15/21 1514      PT SHORT TERM GOAL #1   Title Be independent with initial home exercise program for self-management of symptoms.    Baseline initial HEP to be established at visit 2 as  appropriate (12/26/2020);    Time 3    Period Weeks    Status Achieved    Target Date 01/16/21             PT Long Term Goals - 02/05/21 1313      PT LONG TERM GOAL #1   Title Be independent with a long-term home exercise program for self-management of symptoms.    Baseline initial HEP to be established at visit 2 as appropriate (12/26/2020); currently participating in appropriate HEP (02/05/2021);    Time 12    Period Weeks    Status Partially Met   TARGET DATE FOR ALL LONG TERM GOALS: 03/20/2021     PT LONG TERM GOAL #2   Title Demonstrate improved FOTO score to equal or greater than 69 to demonstrate improvement in overall condition and self-reported functional ability.    Baseline 59 (12/26/2020); 65 (02/05/2021);    Time 12    Period Weeks    Status Partially Met      PT LONG TERM GOAL #3   Title Have full lumbar spine AROM with no compensations or increase in pain in all planes except intermittent end range discomfort to allow patient to complete valued activities with less difficulty.    Baseline painful and limited - see objective exam (12/26/2020); improving slightly - see objective exam (02/05/2021);    Time 12    Period Weeks    Status Partially Met      PT LONG TERM GOAL #4   Title Reduce pain with functional activities to equal or less than 1/10 to allow patient to complete usual activities including ADLs, IADLs, and social engagement with less difficulty.    Baseline 7/10 (12/26/2020); 3/10 (02/05/2021);    Time 12    Period Weeks    Status Partially Met      PT LONG TERM GOAL #5   Title Complete community, work and/or recreational activities without limitation due to current condition.    Baseline limiting ability to complete her usual activities including shopping, errands, anything she is doing out and about, straightening up, activities at home without difficulty (12/26/2020); much improved - she can go shopping, grocery store, etc without having the sudden debilitating  pain, but she has difficulty hiking in the mountains and play at the beach, [place] (02/05/2021);    Time 12    Period Weeks    Status Partially Met                 Plan - 02/26/21 1408    Clinical Impression Statement Patient doing well after her trip to the beach except for some irritation of B anterior knees. Taught  patient ways to decrease pressure in this region during squats and substitute exercise for step ups to use temporarily for relative rest to the knees. Patient continues to have pain during session that is resolved with repeated lumbar extension. patient continues to feel she is benefiting from dry needling. She reports improved tolerance for yardwork and gardening, but continues to have limitations in lifting furniture. Introduced glute pull through today to progress towards improved lifting tolerance.  Patient would benefit from continued management of limiting condition by skilled physical therapist to address remaining impairments and functional limitations to work towards stated goals and return to PLOF or maximal functional independence.    Personal Factors and Comorbidities Age;Comorbidity 3+;Time since onset of injury/illness/exacerbation;Past/Current Experience;Fitness    Comorbidities Relevant past medical history and comorbidities include chronic allergic asthma (epi pen in car), acute sinusitis (two surgeries), skin spot removed, hypertension, dental issues, reflux, hx of cardiac catheterization to prove she doesn't have CHF (heart was healthy), cholecystectomy, osteoarthritis, hx L knee pain (baker's cyst/meniscus).    Examination-Activity Limitations Bend;Locomotion Level;Transfers;Lift;Stand;Carry;Squat;Stairs    Examination-Participation Restrictions Community Activity;Interpersonal Relationship;Laundry;Shop;Driving;[employer] Work   usual activities including shopping, errands, anything she is doing out and about, straightening up, activities at home without  difficulty.   Stability/Clinical Decision Making Evolving/Moderate complexity    Rehab Potential Good    PT Frequency 2x / week    PT Duration 12 weeks    PT Treatment/Interventions ADLs/Self Care Home Management;Aquatic Therapy;Cryotherapy;Moist Heat;Electrical Stimulation;Therapeutic activities;Gait training;Therapeutic exercise;Neuromuscular re-education;Patient/family education;Passive range of motion;Dry needling;Spinal Manipulations;Joint Manipulations;Manual techniques    PT Next Visit Plan manual therapy, trunk strengthening, hip strengthening    PT Home Exercise Plan Medbridge Access Code: [email or web address] ; [email or web address]  [id number] Home Exercise Program [phone])    Consulted and Agree with Plan of Care Patient           Patient will benefit from skilled therapeutic intervention in order to improve the following deficits and impairments:  Pain,Decreased coordination,Decreased mobility,Increased muscle spasms,Postural dysfunction,Decreased activity tolerance,Decreased endurance,Decreased range of motion,Decreased strength,Hypomobility,Impaired perceived functional ability,Difficulty walking  Visit Diagnosis: Chronic right-sided low back pain, unspecified whether sciatica present  Difficulty in walking, not elsewhere classified     Problem List Patient Active Problem List   Diagnosis Date Noted  . Low back pain 11/07/2020  . Dysuria 10/24/2020  . Pain, dental 09/29/2020  . Hypercholesterolemia 04/08/2020  . Left knee pain 04/08/2020  . COVID-19 virus infection 10/31/2019  . Stress 09/25/2019  . Joint stiffness of hand 06/26/2018  . Plantar fasciitis 06/26/2018  . Left leg pain 02/21/2018  . Hyperglycemia 02/21/2018  . Visual disturbance 02/20/2016  . Near syncope 02/20/2016  . Right lower quadrant pain 08/04/2015  . Abnormal liver function tests 08/04/2015  . UTI symptoms 03/31/2015  . Health care maintenance 02/03/2015  . Cough 12/21/2014  . History of colonic polyps  05/29/2014  . Leukopenia 07/07/2013  . Asthma 10/10/2012  . Allergic rhinitis 10/10/2012  . GERD (gastroesophageal reflux disease) 10/10/2012  . Hypertension 10/10/2012    Everlean Alstrom. Graylon Good, PT, DPT 02/26/21, 2:08 PM  [facility] [address] [place], [address] Phone: [phone]   Fax:  [phone]  Name: Chloe Moyer MRN: [id number] Date of Birth: Apr 02, 1951

## 2021-02-28 ENCOUNTER — Encounter: Payer: Self-pay | Admitting: Physical Therapy

## 2021-02-28 ENCOUNTER — Other Ambulatory Visit: Payer: Self-pay

## 2021-02-28 ENCOUNTER — Ambulatory Visit: Payer: PPO | Admitting: Physical Therapy

## 2021-02-28 DIAGNOSIS — G8929 Other chronic pain: Secondary | ICD-10-CM

## 2021-02-28 DIAGNOSIS — M545 Low back pain, unspecified: Secondary | ICD-10-CM | POA: Diagnosis not present

## 2021-02-28 DIAGNOSIS — R262 Difficulty in walking, not elsewhere classified: Secondary | ICD-10-CM

## 2021-02-28 NOTE — Therapy (Signed)
[facility] [address], [place], [address] Phone: [phone]   Fax:  [phone]  Physical Therapy Treatment  Patient Details  Name: Chloe Moyer MRN: [id number] Date of Birth: 05-Aug-1951 Referring Provider (PT): Einar Pheasant, MD   Encounter Date: 02/28/2021   PT End of Session - 02/28/21 1347    Visit Number 15    Number of Visits 24    Date for PT Re-Evaluation 03/20/21    Authorization Type [employer] reporting period from 02/05/2021    Progress Note Due on Visit 10    PT Start Time 1345    PT Stop Time 1425    PT Time Calculation (min) 40 min    Activity Tolerance Patient limited by pain;Patient tolerated treatment well    Behavior During Therapy [facility] for tasks assessed/performed           Past Medical History:  Diagnosis Date  . Allergic rhinitis   . Arthritis   . Asthma   . Diverticulosis, sigmoid   . GERD (gastroesophageal reflux disease)   . History of hiatal hernia   . Hypertension 06-12-2005  . Ulcer     Past Surgical History:  Procedure Laterality Date  . BREAST BIOPSY Right 09/01/2017   Affirm Bx of 2 areas- both areas were fibroadenomatoid change   . BREAST CYST ASPIRATION Left 1990  . CARDIAC CATHETERIZATION  July 2012  . CHOLECYSTECTOMY  1991  . LIPOMA EXCISION  4/99   Left flank area  . NASAL SINUS SURGERY  July 2012  . TUBAL LIGATION  1984    There were no vitals filed for this visit.   Subjective Assessment - 02/28/21 1341    Subjective Pateint reports she is doing well but her knees are still hurting after working a lot in the yard. She has been weed-eating, pulling weeds, planting corn, etc ,yesterday and was exhausted. She was sore after her last PT session all over. She reports no back pain today and knees are 2/10.    Pertinent History Patient is a 70 y.o. female who presents to outpatient physical therapy with a referral for medical diagnosis  Right-sided low back pain without sciatica, unspecified chronicity. This patient's chief complaints consist of right sided intermittent low back pain with single instance of radiation to the R posterior thigh with onset in the last 3 months leading to the following functional deficits: disrupts shopping, errands, anything she is doing out and about, difficulty straightening up, delays activities at home Relevant past medical history and comorbidities include chronic allergic asthma (epi pen in car), acute sinusitis (two surgeries), skin spot removed, hypertension, dental issues, reflux, hx of cardiac catheterization to prove she doesn't have CHF (heart was healthy), cholecystectomy, osteoarthritis, hx L knee pain (baker's cyst/meniscus).  Patient denies hx of cancer, stroke, seizures, lung problem (besides asthma), major cardiac events, diabetes, unexplained weight loss, changes in bowel or bladder problems, new onset stumbling or dropping things, low back surgeryor bladder problems, new onset stumbling or dropping things, low back surgery.    Limitations Lifting;Walking;House hold activities;Other (comment)    Diagnostic tests Radiograph report 11/07/2020: "FINDINGS:  Frontal, lateral, and spot lumbosacral lateral images were obtained.  There are 5 non-rib-bearing lumbar type vertebral bodies. 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 There is thoracolumbar levoscoliosis.  There is no fracture or spondylolisthesis. There is moderately  severe disc space narrowing at L3-4 and L4-5 with milder disc space  narrowing at L5-S1. There  is facet osteoarthritic change at L5-S1  bilaterally and at L4-5 on the right. IMPRESSION:  Scoliosis with osteoarthritic change at several levels. No fracture  or spondylolisthesis."    Patient Stated Goals "to get to where she is pain free all the time" and "to get limbered up"    Currently in Pain? Yes    Pain Score 2     Pain Location Knee    Pain Orientation Right;Left;Anterior;Medial            TREATMENT:  Therapeutic exercise:to centralize symptoms and improve ROM, strength, muscular endurance, and activity tolerance required for successful completion of functional activities.  - standing lumbar extension with plinth support behindx30, x20, x20 (interspersed throughout session).     - single leg bridge, 3x10 each side. .  - standing glute pull through, 15# cable, 3x15. Cuing for hinge, form, etc.   Ball toss at [employer] (balance, power, proprioception, functional movement):  - sideways twisting with 3kg med ball while standing on airex pad,3x20each side (stopped to stretch back between sets).   - Step up and over (switching feet at top) largedynadiscleading with one leg, 1x10 each side. CGA for safety. - forward/backward tandem walking on 5 foot aeromat, x10 times with CGA.  - jumping jacks (legs/arms) 2x10   HOME EXERCISE PROGRAM Access Code: [email or web address] URL: [email or web address] Date: 02/26/2021 Prepared by: Rosita Kea  Exercises Supine Double Knee to Chest - 1 x daily - 2 sets - 10 reps - 5 seconds hold Supine Lower Trunk Rotation - 1 x daily - 2 sets - 10 reps - 1-5 seconds hold Supine Quadratus Lumborum Stretch - 1 x daily - 2 reps - 30 seconds hold Standing Lumbar Extension with Counter - 2-4 x daily - 1-2 sets - 10 reps - 1 second hold Bridge with Hip Abduction and Resistance - 1 x daily - 1 sets - 20 reps - 5seconds hold Seated Posture with Lumbar Roll Runner's Step Up/Down - 2-3 x weekly - 2 sets - 10 reps Single Leg Bridge - 2-3 x weekly - 2 sets - 10 reps - 2-3 seconds hold Squat with Counter Support - 2-3 x weekly - 1 sets - 10 reps Jumping Jacks - 2-3 x weekly - 2 sets - 10 reps  [email or web address] [place] [[id number]]  Lumbar Extension in Standing - Repeat 10 Times, Hold 2 Seconds, Complete 2 Sets, Perform 5 Times a Day    PT Education - 02/28/21 1347    Education Details therex  form/technique,    Person(s) Educated Patient    Methods Explanation;Demonstration;Tactile cues;Verbal cues    Comprehension Verbalized understanding;Returned demonstration;Verbal cues required;Tactile cues required;Need further instruction            PT Short Term Goals - 01/15/21 1514      PT SHORT TERM GOAL #1   Title Be independent with initial home exercise program for self-management of symptoms.    Baseline initial HEP to be established at visit 2 as appropriate (12/26/2020);    Time 3    Period Weeks    Status Achieved    Target Date 01/16/21             PT Long Term Goals - 02/05/21 1313      PT LONG TERM GOAL #1   Title Be independent with a long-term home exercise program for self-management of symptoms.    Baseline initial HEP to be established at visit 2 as appropriate (12/26/2020); currently participating in appropriate HEP (02/05/2021);  Time 12    Period Weeks    Status Partially Met   TARGET DATE FOR ALL LONG TERM GOALS: 03/20/2021     PT LONG TERM GOAL #2   Title Demonstrate improved FOTO score to equal or greater than 69 to demonstrate improvement in overall condition and self-reported functional ability.    Baseline 59 (12/26/2020); 65 (02/05/2021);    Time 12    Period Weeks    Status Partially Met      PT LONG TERM GOAL #3   Title Have full lumbar spine AROM with no compensations or increase in pain in all planes except intermittent end range discomfort to allow patient to complete valued activities with less difficulty.    Baseline painful and limited - see objective exam (12/26/2020); improving slightly - see objective exam (02/05/2021);    Time 12    Period Weeks    Status Partially Met      PT LONG TERM GOAL #4   Title Reduce pain with functional activities to equal or less than 1/10 to allow patient to complete usual activities including ADLs, IADLs, and social engagement with less difficulty.    Baseline 7/10 (12/26/2020); 3/10 (02/05/2021);    Time 12     Period Weeks    Status Partially Met      PT LONG TERM GOAL #5   Title Complete community, work and/or recreational activities without limitation due to current condition.    Baseline limiting ability to complete her usual activities including shopping, errands, anything she is doing out and about, straightening up, activities at home without difficulty (12/26/2020); much improved - she can go shopping, grocery store, etc without having the sudden debilitating pain, but she has difficulty hiking in the mountains and play at the beach, [place] (02/05/2021);    Time 12    Period Weeks    Status Partially Met                 Plan - 02/28/21 1454    Clinical Impression Statement Patient tolerated treatment well overall and was limited by fatigue with more advanced exercises. Mild increase in back pain with heavier exercises resolved with repeated lumbar extension. Patient would benefit from continued management of limiting condition by skilled physical therapist to address remaining impairments and functional limitations to work towards stated goals and return to PLOF or maximal functional independence.    Personal Factors and Comorbidities Age;Comorbidity 3+;Time since onset of injury/illness/exacerbation;Past/Current Experience;Fitness    Comorbidities Relevant past medical history and comorbidities include chronic allergic asthma (epi pen in car), acute sinusitis (two surgeries), skin spot removed, hypertension, dental issues, reflux, hx of cardiac catheterization to prove she doesn't have CHF (heart was healthy), cholecystectomy, osteoarthritis, hx L knee pain (baker's cyst/meniscus).    Examination-Activity Limitations Bend;Locomotion Level;Transfers;Lift;Stand;Carry;Squat;Stairs    Examination-Participation Restrictions Community Activity;Interpersonal Relationship;Laundry;Shop;Driving;[employer] Work   usual activities including shopping, errands, anything she is doing out and about,  straightening up, activities at home without difficulty.   Stability/Clinical Decision Making Evolving/Moderate complexity    Rehab Potential Good    PT Frequency 2x / week    PT Duration 12 weeks    PT Treatment/Interventions ADLs/Self Care Home Management;Aquatic Therapy;Cryotherapy;Moist Heat;Electrical Stimulation;Therapeutic activities;Gait training;Therapeutic exercise;Neuromuscular re-education;Patient/family education;Passive range of motion;Dry needling;Spinal Manipulations;Joint Manipulations;Manual techniques    PT Next Visit Plan manual therapy, trunk strengthening, hip strengthening    PT Home Exercise Plan Medbridge Access Code: [email or web address] ; [email or web address]  [id number] Home Exercise Program [phone])    Consulted  and Agree with Plan of Care Patient           Patient will benefit from skilled therapeutic intervention in order to improve the following deficits and impairments:  Pain,Decreased coordination,Decreased mobility,Increased muscle spasms,Postural dysfunction,Decreased activity tolerance,Decreased endurance,Decreased range of motion,Decreased strength,Hypomobility,Impaired perceived functional ability,Difficulty walking  Visit Diagnosis: Chronic right-sided low back pain, unspecified whether sciatica present  Difficulty in walking, not elsewhere classified     Problem List Patient Active Problem List   Diagnosis Date Noted  . Low back pain 11/07/2020  . Dysuria 10/24/2020  . Pain, dental 09/29/2020  . Hypercholesterolemia 04/08/2020  . Left knee pain 04/08/2020  . COVID-19 virus infection 10/31/2019  . Stress 09/25/2019  . Joint stiffness of hand 06/26/2018  . Plantar fasciitis 06/26/2018  . Left leg pain 02/21/2018  . Hyperglycemia 02/21/2018  . Visual disturbance 02/20/2016  . Near syncope 02/20/2016  . Right lower quadrant pain 08/04/2015  . Abnormal liver function tests 08/04/2015  . UTI symptoms 03/31/2015  . Health care maintenance 02/03/2015  . Cough  12/21/2014  . History of colonic polyps 05/29/2014  . Leukopenia 07/07/2013  . Asthma 10/10/2012  . Allergic rhinitis 10/10/2012  . GERD (gastroesophageal reflux disease) 10/10/2012  . Hypertension 10/10/2012    Everlean Alstrom. Graylon Good, PT, DPT 02/28/21, 2:56 PM  [facility] [address] [place], [address] Phone: [phone]   Fax:  [phone]  Name: Chloe Moyer MRN: [id number] Date of Birth: 1951/03/05

## 2021-03-05 ENCOUNTER — Other Ambulatory Visit: Payer: Self-pay

## 2021-03-05 ENCOUNTER — Encounter: Payer: Self-pay | Admitting: Physical Therapy

## 2021-03-05 ENCOUNTER — Ambulatory Visit: Payer: PPO | Admitting: Physical Therapy

## 2021-03-05 DIAGNOSIS — M545 Low back pain, unspecified: Secondary | ICD-10-CM

## 2021-03-05 DIAGNOSIS — G8929 Other chronic pain: Secondary | ICD-10-CM

## 2021-03-05 DIAGNOSIS — R262 Difficulty in walking, not elsewhere classified: Secondary | ICD-10-CM

## 2021-03-05 NOTE — Therapy (Signed)
[facility] [address], [place], [address] Phone: [phone]   Fax:  [phone]  Physical Therapy Treatment  Patient Details  Name: Chloe Moyer MRN: [id number] Date of Birth: [birth year] Referring Provider (PT): Einar Pheasant, MD   Encounter Date: 03/05/2021   PT End of Session - 03/05/21 0957    Visit Number 16    Number of Visits 24    Date for PT Re-Evaluation 03/20/21    Authorization Type [employer] reporting period from 02/05/2021    Progress Note Due on Visit 10    PT Start Time 0946    PT Stop Time 1027    PT Time Calculation (min) 41 min    Activity Tolerance Patient limited by pain;Patient tolerated treatment well    Behavior During Therapy [facility] for tasks assessed/performed           Past Medical History:  Diagnosis Date  . Allergic rhinitis   . Arthritis   . Asthma   . Diverticulosis, sigmoid   . GERD (gastroesophageal reflux disease)   . History of hiatal hernia   . Hypertension 06-12-2005  . Ulcer     Past Surgical History:  Procedure Laterality Date  . BREAST BIOPSY Right 09/01/2017   Affirm Bx of 2 areas- both areas were fibroadenomatoid change   . BREAST CYST ASPIRATION Left 1990  . CARDIAC CATHETERIZATION  July 2012  . CHOLECYSTECTOMY  1991  . LIPOMA EXCISION  4/99   Left flank area  . NASAL SINUS SURGERY  July 2012  . TUBAL LIGATION  1984    There were no vitals filed for this visit.   Subjective Assessment - 03/05/21 0952    Subjective Patient reports she has 1/10 in her R low back and B knees. Altered gait pattern she feels is her knees. Has stopped squats and steps ups as previously discused with PT. Still doing a lot of yardwork and home duties that have been bothering her knees. Back bothered her more on Saturday after standing and cooking all day. Has been pulling a lot of weeds. Felt okay after her last treatment session.    Pertinent History  Patient is a 70 y.o. female who presents to outpatient physical therapy with a referral for medical diagnosis Right-sided low back pain without sciatica, unspecified chronicity. This patient's chief complaints consist of right sided intermittent low back pain with single instance of radiation to the R posterior thigh with onset in the last 3 months leading to the following functional deficits: disrupts shopping, errands, anything she is doing out and about, difficulty straightening up, delays activities at home Relevant past medical history and comorbidities include chronic allergic asthma (epi pen in car), acute sinusitis (two surgeries), skin spot removed, hypertension, dental issues, reflux, hx of cardiac catheterization to prove she doesn't have CHF (heart was healthy), cholecystectomy, osteoarthritis, hx L knee pain (baker's cyst/meniscus).  Patient denies hx of cancer, stroke, seizures, lung problem (besides asthma), major cardiac events, diabetes, unexplained weight loss, changes in bowel or bladder problems, new onset stumbling or dropping things, low back surgeryor bladder problems, new onset stumbling or dropping things, low back surgery.    Limitations Lifting;Walking;House hold activities;Other (comment)    Diagnostic tests Radiograph report 11/07/2020: "FINDINGS:  Frontal, lateral, and spot lumbosacral lateral images were obtained.  There are 5 non-rib-bearing lumbar type vertebral bodies. 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 There is thoracolumbar levoscoliosis.  There is no fracture or spondylolisthesis.  There is moderately  severe disc space narrowing at L3-4 and L4-5 with milder disc space  narrowing at L5-S1. There is facet osteoarthritic change at L5-S1  bilaterally and at L4-5 on the right. IMPRESSION:  Scoliosis with osteoarthritic change at several levels. No fracture  or spondylolisthesis."    Patient Stated Goals "to get to where she is pain free all the time" and "to get limbered up"     Currently in Pain? Yes    Pain Score 1     Pain Location Back    Pain Orientation Right           TREATMENT:  Therapeutic exercise:to centralize symptoms and improve ROM, strength, muscular endurance, and activity tolerance required for successful completion of functional activities.  - standing lumbar extension with plinth support behindx30,x15,  x20 (interspersed throughout session).     - single leg bridge, 2x15 each side. . (dry needling/manual - see below)  - standing glute pull through, 20# cable, 3x15. Cuing for hinge, form, etc. - Step up and over (switching feet at top) largedynadiscleading with one leg, 1x10 each side. CGA for safety  Ball toss at rebounder (balance, power, proprioception, functional movement):  - sideways twisting with 3kg med ball while standing on large dynadisc pad,1x10each side  Manual therapy: to reduce pain and tissue tension, improve range of motion, neuromodulation, in order to promote improved ability to complete functional activities. PRONE - STM to lumbar paraspinals, R > L  Modality: (unbilled) Dry needling performed to right lumbar parapsinals to decrease pain and spasms along patient's right lumbar region with patient in prone utilizing (2) dry needle(s) .10m x 720mwith (3) sticks at right lumbar multifidi at L4 and L5 levels . Patient educated about the risks and benefits from therapy and verbally consents to treatment.  Dry needling performed by SaEverlean AlstromSnGraylon GoodT, DPT who is certified in this technique.    HOME EXERCISE PROGRAM Access Code: [email or web address]: [email or web address] Date: 02/26/2021 Prepared by: SaRosita KeaExercises Supine Double Knee to Chest - 1 x daily - 2 sets - 10 reps - 5 seconds hold Supine Lower Trunk Rotation - 1 x daily - 2 sets - 10 reps - 1-5 seconds hold Supine Quadratus Lumborum Stretch - 1 x daily - 2 reps - 30 seconds hold Standing Lumbar Extension with Counter - 2-4 x  daily - 1-2 sets - 10 reps - 1 second hold Bridge with Hip Abduction and Resistance - 1 x daily - 1 sets - 20 reps - 5seconds hold Seated Posture with Lumbar Roll Runner's Step Up/Down - 2-3 x weekly - 2 sets - 10 reps Single Leg Bridge - 2-3 x weekly - 2 sets - 10 reps - 2-3 seconds hold Squat with Counter Support - 2-3 x weekly - 1 sets - 10 reps Jumping Jacks - 2-3 x weekly - 2 sets - 10 reps  [email or web address] FIShip Bottom9[[id number]]Lumbar Extension in Standing - Repeat 10 Times, Hold 2 Seconds, Complete 2 Sets, Perform 5 Times a Day    PT Education - 03/05/21 0957    Education Details therex form/technique,    Person(s) Educated Patient    Methods Explanation;Demonstration;Tactile cues;Verbal cues    Comprehension Verbalized understanding;Returned demonstration;Verbal cues required;Tactile cues required;Need further instruction            PT Short Term Goals - 01/15/21 1514      PT SHORT TERM GOAL #1   Title Be independent with initial home  exercise program for self-management of symptoms.    Baseline initial HEP to be established at visit 2 as appropriate (12/26/2020);    Time 3    Period Weeks    Status Achieved    Target Date 01/16/21             PT Long Term Goals - 02/05/21 1313      PT LONG TERM GOAL #1   Title Be independent with a long-term home exercise program for self-management of symptoms.    Baseline initial HEP to be established at visit 2 as appropriate (12/26/2020); currently participating in appropriate HEP (02/05/2021);    Time 12    Period Weeks    Status Partially Met   TARGET DATE FOR ALL LONG TERM GOALS: 03/20/2021     PT LONG TERM GOAL #2   Title Demonstrate improved FOTO score to equal or greater than 69 to demonstrate improvement in overall condition and self-reported functional ability.    Baseline 59 (12/26/2020); 65 (02/05/2021);    Time 12    Period Weeks    Status Partially Met      PT LONG TERM GOAL #3   Title Have  full lumbar spine AROM with no compensations or increase in pain in all planes except intermittent end range discomfort to allow patient to complete valued activities with less difficulty.    Baseline painful and limited - see objective exam (12/26/2020); improving slightly - see objective exam (02/05/2021);    Time 12    Period Weeks    Status Partially Met      PT LONG TERM GOAL #4   Title Reduce pain with functional activities to equal or less than 1/10 to allow patient to complete usual activities including ADLs, IADLs, and social engagement with less difficulty.    Baseline 7/10 (12/26/2020); 3/10 (02/05/2021);    Time 12    Period Weeks    Status Partially Met      PT LONG TERM GOAL #5   Title Complete community, work and/or recreational activities without limitation due to current condition.    Baseline limiting ability to complete her usual activities including shopping, errands, anything she is doing out and about, straightening up, activities at home without difficulty (12/26/2020); much improved - she can go shopping, grocery store, etc without having the sudden debilitating pain, but she has difficulty hiking in the mountains and play at the beach, [place] (02/05/2021);    Time 12    Period Weeks    Status Partially Met                 Plan - 03/05/21 2008    Clinical Impression Statement Patient tolerated treatment well and reported feeling better by end of session compared to start. Was more tender than usual to dry needling but continued to feel benefit from it. Was able to increase weight on posterior chain exercises. Continued to modify exercise selection due to B knee pain. Patient would benefit from continued management of limiting condition by skilled physical therapist to address remaining impairments and functional limitations to work towards stated goals and return to PLOF or maximal functional independence.    Personal Factors and Comorbidities Age;Comorbidity  3+;Time since onset of injury/illness/exacerbation;Past/Current Experience;Fitness    Comorbidities Relevant past medical history and comorbidities include chronic allergic asthma (epi pen in car), acute sinusitis (two surgeries), skin spot removed, hypertension, dental issues, reflux, hx of cardiac catheterization to prove she doesn't have CHF (heart was healthy),  cholecystectomy, osteoarthritis, hx L knee pain (baker's cyst/meniscus).    Examination-Activity Limitations Bend;Locomotion Level;Transfers;Lift;Stand;Carry;Squat;Stairs    Examination-Participation Restrictions Community Activity;Interpersonal Relationship;Laundry;Shop;Driving;[employer] Work   usual activities including shopping, errands, anything she is doing out and about, straightening up, activities at home without difficulty.   Stability/Clinical Decision Making Evolving/Moderate complexity    Rehab Potential Good    PT Frequency 2x / week    PT Duration 12 weeks    PT Treatment/Interventions ADLs/Self Care Home Management;Aquatic Therapy;Cryotherapy;Moist Heat;Electrical Stimulation;Therapeutic activities;Gait training;Therapeutic exercise;Neuromuscular re-education;Patient/family education;Passive range of motion;Dry needling;Spinal Manipulations;Joint Manipulations;Manual techniques    PT Next Visit Plan manual therapy, trunk strengthening, hip strengthening    PT Home Exercise Plan Medbridge Access Code: [email or web address] ; [email or web address]  [id number] Home Exercise Program [phone])    Consulted and Agree with Plan of Care Patient           Patient will benefit from skilled therapeutic intervention in order to improve the following deficits and impairments:  Pain,Decreased coordination,Decreased mobility,Increased muscle spasms,Postural dysfunction,Decreased activity tolerance,Decreased endurance,Decreased range of motion,Decreased strength,Hypomobility,Impaired perceived functional ability,Difficulty walking  Visit Diagnosis: Chronic  right-sided low back pain, unspecified whether sciatica present  Difficulty in walking, not elsewhere classified     Problem List Patient Active Problem List   Diagnosis Date Noted  . Low back pain 11/07/2020  . Dysuria 10/24/2020  . Pain, dental 09/29/2020  . Hypercholesterolemia 04/08/2020  . Left knee pain 04/08/2020  . COVID-19 virus infection 10/31/2019  . Stress 09/25/2019  . Joint stiffness of hand 06/26/2018  . Plantar fasciitis 06/26/2018  . Left leg pain 02/21/2018  . Hyperglycemia 02/21/2018  . Visual disturbance 02/20/2016  . Near syncope 02/20/2016  . Right lower quadrant pain 08/04/2015  . Abnormal liver function tests 08/04/2015  . UTI symptoms 03/31/2015  . Health care maintenance 02/03/2015  . Cough 12/21/2014  . History of colonic polyps 05/29/2014  . Leukopenia 07/07/2013  . Asthma 10/10/2012  . Allergic rhinitis 10/10/2012  . GERD (gastroesophageal reflux disease) 10/10/2012  . Hypertension 10/10/2012    Everlean Alstrom. Graylon Good, PT, DPT 03/05/21, 8:09 PM  [facility] [address], [place], [address] Phone: [phone]   Fax:  [phone]  Name: YEVETTE KNUST MRN: [id number] Date of Birth: 1951/06/09

## 2021-03-07 ENCOUNTER — Ambulatory Visit: Payer: PPO | Admitting: Physical Therapy

## 2021-03-07 ENCOUNTER — Encounter: Payer: Self-pay | Admitting: Physical Therapy

## 2021-03-07 ENCOUNTER — Other Ambulatory Visit: Payer: Self-pay

## 2021-03-07 DIAGNOSIS — M545 Low back pain, unspecified: Secondary | ICD-10-CM

## 2021-03-07 DIAGNOSIS — R262 Difficulty in walking, not elsewhere classified: Secondary | ICD-10-CM

## 2021-03-07 DIAGNOSIS — G8929 Other chronic pain: Secondary | ICD-10-CM

## 2021-03-07 NOTE — Therapy (Signed)
[facility] [address], [place], [address] Phone: [phone]   Fax:  [phone]  Physical Therapy Treatment  Patient Details  Name: Chloe Moyer MRN: [id number] Date of Birth: 1951/06/24 Referring Provider (PT): Einar Pheasant, MD   Encounter Date: 03/07/2021   PT End of Session - 03/07/21 2016    Visit Number 17    Number of Visits 24    Date for PT Re-Evaluation 03/20/21    Authorization Type [employer] reporting period from 02/05/2021    Progress Note Due on Visit 10    PT Start Time 0947    PT Stop Time 1030    PT Time Calculation (min) 43 min    Equipment Utilized During Treatment Gait belt    Activity Tolerance Patient limited by pain;Patient tolerated treatment well    Behavior During Therapy [facility] for tasks assessed/performed           Past Medical History:  Diagnosis Date  . Allergic rhinitis   . Arthritis   . Asthma   . Diverticulosis, sigmoid   . GERD (gastroesophageal reflux disease)   . History of hiatal hernia   . Hypertension 06-12-2005  . Ulcer     Past Surgical History:  Procedure Laterality Date  . BREAST BIOPSY Right 09/01/2017   Affirm Bx of 2 areas- both areas were fibroadenomatoid change   . BREAST CYST ASPIRATION Left 1990  . CARDIAC CATHETERIZATION  July 2012  . CHOLECYSTECTOMY  1991  . LIPOMA EXCISION  4/99   Left flank area  . NASAL SINUS SURGERY  July 2012  . TUBAL LIGATION  1984    There were no vitals filed for this visit.   Subjective Assessment - 03/07/21 0951    Subjective Pateint reports he back is feeling great today. States she had the best day yesterday she has had in a really long time. She thinks the dry needling really helps her. Her knees continue to bother her; rates pain there 2/10. Thinks it would be better if she took her celebrex as precribed but it upsets her stomach. 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    Pertinent History Patient is a 70 y.o. female who presents to outpatient physical therapy with a referral for medical diagnosis Right-sided low back pain without sciatica, unspecified chronicity. This patient's chief complaints consist of right sided intermittent low back pain with single instance of radiation to the R posterior thigh with onset in the last 3 months leading to the following functional deficits: disrupts shopping, errands, anything she is doing out and about, difficulty straightening up, delays activities at home Relevant past medical history and comorbidities include chronic allergic asthma (epi pen in car), acute sinusitis (two surgeries), skin spot removed, hypertension, dental issues, reflux, hx of cardiac catheterization to prove she doesn't have CHF (heart was healthy), cholecystectomy, osteoarthritis, hx L knee pain (baker's cyst/meniscus).  Patient denies hx of cancer, stroke, seizures, lung problem (besides asthma), major cardiac events, diabetes, unexplained weight loss, changes in bowel or bladder problems, new onset stumbling or dropping things, low back surgeryor bladder problems, new onset stumbling or dropping things, low back surgery.    Limitations Lifting;Walking;House hold activities;Other (comment)    Diagnostic tests Radiograph report 11/07/2020: "FINDINGS:  Frontal, lateral, and spot lumbosacral lateral images were obtained.  There are 5 non-rib-bearing lumbar type vertebral bodies. 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 There is thoracolumbar levoscoliosis.  There is no fracture or spondylolisthesis. There is moderately  severe disc space narrowing at L3-4 and L4-5 with milder disc space  narrowing at L5-S1. There is facet osteoarthritic change at L5-S1  bilaterally and at L4-5 on the right. IMPRESSION:  Scoliosis with osteoarthritic change at several levels. No fracture  or spondylolisthesis."    Patient Stated Goals "to get to where  she is pain free all the time" and "to get limbered up"    Currently in Pain? Yes    Pain Score 2     Pain Location Knee    Pain Orientation Right;Left;Anterior;Medial           TREATMENT:  Therapeutic exercise:to centralize symptoms and improve ROM, strength, muscular endurance, and activity tolerance required for successful completion of functional activities.  - standing lumbar extension with plinth support behindx30, x15, x20(interspersed throughout session).   - single leg bridge,3x15 each side.  (dry needling/manual - see below)  Ball toss at [employer] (balance, power, proprioception, functional movement):  - forwards then sideways twisting with 3kg med ball while standing on large dynadisc pad,2x10each direction.   - standing glute pull through,25/25/20# cable, 3x15.Cuing for hinge, form.  Manual therapy: to reduce pain and tissue tension, improve range of motion, neuromodulation, in order to promote improved ability to complete functional activities. PRONE - STM to lumbar paraspinals, R > L  Modality: (unbilled) Dry needling performed to right lumbar parapsinals to decrease pain and spasms along patient's right lumbar region with patient in prone utilizing (2) dry needle(s) .84m x 752mwith (5) sticks at right lumbar multifidi at L3, L4 and L5 levels and left lumbar multifidi at L4 . Patient educated about the risks and benefits from therapy and verbally consents to treatment. Dry needling performed by SaEverlean AlstromSnGraylon GoodT, DPT who is certified in this technique.  HOME EXERCISE PROGRAM Access Code: [email or web address]: [email or web address] Date: 02/26/2021 Prepared by: SaRosita KeaExercises Supine Double Knee to Chest - 1 x daily - 2 sets - 10 reps - 5 seconds hold Supine Lower Trunk Rotation - 1 x daily - 2 sets - 10 reps - 1-5 seconds hold Supine Quadratus Lumborum Stretch - 1 x daily - 2 reps - 30 seconds hold Standing Lumbar Extension  with Counter - 2-4 x daily - 1-2 sets - 10 reps - 1 second hold Bridge with Hip Abduction and Resistance - 1 x daily - 1 sets - 20 reps - 5seconds hold Seated Posture with Lumbar Roll Runner's Step Up/Down - 2-3 x weekly - 2 sets - 10 reps Single Leg Bridge - 2-3 x weekly - 2 sets - 10 reps - 2-3 seconds hold Squat with Counter Support - 2-3 x weekly - 1 sets - 10 reps Jumping Jacks - 2-3 x weekly - 2 sets - 10 reps  [email or web address] FINorth [place]9[[id number]]Lumbar Extension in Standing - Repeat 10 Times, Hold 2 Seconds, Complete 2 Sets, Perform 5 Times a Day     PT Education - 03/07/21 2016    Education Details therex form/technique,    Person(s) Educated Patient    Methods Explanation;Demonstration;Tactile cues;Verbal cues    Comprehension Verbalized understanding;Returned demonstration;Verbal cues required;Tactile cues required;Need further instruction            PT Short Term Goals - 01/15/21 1514      PT SHORT TERM GOAL #1   Title Be independent with initial home exercise program for self-management  of symptoms.    Baseline initial HEP to be established at visit 2 as appropriate (12/26/2020);    Time 3    Period Weeks    Status Achieved    Target Date 01/16/21             PT Long Term Goals - 02/05/21 1313      PT LONG TERM GOAL #1   Title Be independent with a long-term home exercise program for self-management of symptoms.    Baseline initial HEP to be established at visit 2 as appropriate (12/26/2020); currently participating in appropriate HEP (02/05/2021);    Time 12    Period Weeks    Status Partially Met   TARGET DATE FOR ALL LONG TERM GOALS: 03/20/2021     PT LONG TERM GOAL #2   Title Demonstrate improved FOTO score to equal or greater than 69 to demonstrate improvement in overall condition and self-reported functional ability.    Baseline 59 (12/26/2020); 65 (02/05/2021);    Time 12    Period Weeks    Status Partially Met      PT LONG TERM  GOAL #3   Title Have full lumbar spine AROM with no compensations or increase in pain in all planes except intermittent end range discomfort to allow patient to complete valued activities with less difficulty.    Baseline painful and limited - see objective exam (12/26/2020); improving slightly - see objective exam (02/05/2021);    Time 12    Period Weeks    Status Partially Met      PT LONG TERM GOAL #4   Title Reduce pain with functional activities to equal or less than 1/10 to allow patient to complete usual activities including ADLs, IADLs, and social engagement with less difficulty.    Baseline 7/10 (12/26/2020); 3/10 (02/05/2021);    Time 12    Period Weeks    Status Partially Met      PT LONG TERM GOAL #5   Title Complete community, work and/or recreational activities without limitation due to current condition.    Baseline limiting ability to complete her usual activities including shopping, errands, anything she is doing out and about, straightening up, activities at home without difficulty (12/26/2020); much improved - she can go shopping, grocery store, etc without having the sudden debilitating pain, but she has difficulty hiking in the mountains and play at the beach, [place] (02/05/2021);    Time 12    Period Weeks    Status Partially Met                 Plan - 03/07/21 2019    Clinical Impression Statement Patient tolerated treatment well overall with no increase in pain by end of session. Continued to work on LE/hip/functional strengthening and balance as tolerated and use dry needling for pain control per patient preference. She continues to report significant relief the day following dry needling. Knees continue to limit squats and steps ups. Patient would benefit from continued management of limiting condition by skilled physical therapist to address remaining impairments and functional limitations to work towards stated goals and return to PLOF or maximal functional  independence.    Personal Factors and Comorbidities Age;Comorbidity 3+;Time since onset of injury/illness/exacerbation;Past/Current Experience;Fitness    Comorbidities Relevant past medical history and comorbidities include chronic allergic asthma (epi pen in car), acute sinusitis (two surgeries), skin spot removed, hypertension, dental issues, reflux, hx of cardiac catheterization to prove she doesn't have CHF (heart was healthy),  cholecystectomy, osteoarthritis, hx L knee pain (baker's cyst/meniscus).    Examination-Activity Limitations Bend;Locomotion Level;Transfers;Lift;Stand;Carry;Squat;Stairs    Examination-Participation Restrictions Community Activity;Interpersonal Relationship;Laundry;Shop;Driving;[employer] Work   usual activities including shopping, errands, anything she is doing out and about, straightening up, activities at home without difficulty.   Stability/Clinical Decision Making Evolving/Moderate complexity    Rehab Potential Good    PT Frequency 2x / week    PT Duration 12 weeks    PT Treatment/Interventions ADLs/Self Care Home Management;Aquatic Therapy;Cryotherapy;Moist Heat;Electrical Stimulation;Therapeutic activities;Gait training;Therapeutic exercise;Neuromuscular re-education;Patient/family education;Passive range of motion;Dry needling;Spinal Manipulations;Joint Manipulations;Manual techniques    PT Next Visit Plan manual therapy, trunk strengthening, hip strengthening    PT Home Exercise Plan Medbridge Access Code: [email or web address] ; [email or web address]  [id number] Home Exercise Program [phone])    Consulted and Agree with Plan of Care Patient           Patient will benefit from skilled therapeutic intervention in order to improve the following deficits and impairments:  Pain,Decreased coordination,Decreased mobility,Increased muscle spasms,Postural dysfunction,Decreased activity tolerance,Decreased endurance,Decreased range of motion,Decreased strength,Hypomobility,Impaired perceived  functional ability,Difficulty walking  Visit Diagnosis: Chronic right-sided low back pain, unspecified whether sciatica present  Difficulty in walking, not elsewhere classified     Problem List Patient Active Problem List   Diagnosis Date Noted  . Low back pain 11/07/2020  . Dysuria 10/24/2020  . Pain, dental 09/29/2020  . Hypercholesterolemia 04/08/2020  . Left knee pain 04/08/2020  . COVID-19 virus infection 10/31/2019  . Stress 09/25/2019  . Joint stiffness of hand 06/26/2018  . Plantar fasciitis 06/26/2018  . Left leg pain 02/21/2018  . Hyperglycemia 02/21/2018  . Visual disturbance 02/20/2016  . Near syncope 02/20/2016  . Right lower quadrant pain 08/04/2015  . Abnormal liver function tests 08/04/2015  . UTI symptoms 03/31/2015  . Health care maintenance 02/03/2015  . Cough 12/21/2014  . History of colonic polyps 05/29/2014  . Leukopenia 07/07/2013  . Asthma 10/10/2012  . Allergic rhinitis 10/10/2012  . GERD (gastroesophageal reflux disease) 10/10/2012  . Hypertension 10/10/2012   Everlean Alstrom. Graylon Good, PT, DPT 03/07/21, 8:20 PM  [facility] [address] [place], [address] Phone: [phone]   Fax:  [phone]  Name: Chloe Moyer MRN: [id number] Date of Birth: [birth year]

## 2021-03-12 ENCOUNTER — Other Ambulatory Visit: Payer: Self-pay

## 2021-03-12 ENCOUNTER — Ambulatory Visit: Payer: PPO

## 2021-03-12 DIAGNOSIS — R262 Difficulty in walking, not elsewhere classified: Secondary | ICD-10-CM

## 2021-03-12 DIAGNOSIS — M545 Low back pain, unspecified: Secondary | ICD-10-CM | POA: Diagnosis not present

## 2021-03-12 NOTE — Therapy (Signed)
[facility] [address], [place], [address] Phone: [phone]   Fax:  [phone]  Physical Therapy Treatment  Patient Details  Name: Chloe Moyer MRN: [id number] Date of Birth: [birth year] Referring Provider (PT): Einar Pheasant, MD   Encounter Date: 03/12/2021   PT End of Session - 03/12/21 1306    Visit Number 18    Number of Visits 24    Date for PT Re-Evaluation 03/20/21    Authorization Type [employer] reporting period from 02/05/2021    PT Start Time 1301    PT Stop Time 1341    PT Time Calculation (min) 40 min    Equipment Utilized During Treatment Gait belt    Activity Tolerance Patient limited by pain;Patient tolerated treatment well    Behavior During Therapy [facility] for tasks assessed/performed           Past Medical History:  Diagnosis Date  . Allergic rhinitis   . Arthritis   . Asthma   . Diverticulosis, sigmoid   . GERD (gastroesophageal reflux disease)   . History of hiatal hernia   . Hypertension 06-12-2005  . Ulcer     Past Surgical History:  Procedure Laterality Date  . BREAST BIOPSY Right 09/01/2017   Affirm Bx of 2 areas- both areas were fibroadenomatoid change   . BREAST CYST ASPIRATION Left 1990  . CARDIAC CATHETERIZATION  July 2012  . CHOLECYSTECTOMY  1991  . LIPOMA EXCISION  4/99   Left flank area  . NASAL SINUS SURGERY  July 2012  . TUBAL LIGATION  1984    There were no vitals filed for this visit.   Subjective Assessment - 03/12/21 1305    Subjective Pt reports feeling pleased with progress. Her back and rt hip are feeling better. She continues to have acut emedial knee pain bilat. HEP is coming along nicely. Denies any medical or medication updates.    Pertinent History Patient is a 70 y.o. female who presents to outpatient physical therapy with a referral for medical diagnosis Right-sided low back pain without sciatica, unspecified chronicity. This  patient's chief complaints consist of right sided intermittent low back pain with single instance of radiation to the R posterior thigh with onset in the last 3 months leading to the following functional deficits: disrupts shopping, errands, anything she is doing out and about, difficulty straightening up, delays activities at home Relevant past medical history and comorbidities include chronic allergic asthma (epi pen in car), acute sinusitis (two surgeries), skin spot removed, hypertension, dental issues, reflux, hx of cardiac catheterization to prove she doesn't have CHF (heart was healthy), cholecystectomy, osteoarthritis, hx L knee pain (baker's cyst/meniscus).  Patient denies hx of cancer, stroke, seizures, lung problem (besides asthma), major cardiac events, diabetes, unexplained weight loss, changes in bowel or bladder problems, new onset stumbling or dropping things, low back surgeryor bladder problems, new onset stumbling or dropping things, low back surgery.    Currently in Pain? Yes    Pain Score 1     Pain Location Knee    Pain Orientation Right;Left           INTERVENTION THIS DATE:  Therapeutic exercise: to centralize symptoms and improve ROM, strength, muscular endurance, and activity tolerance required for successful completion of functional activities.   - standing lumbar extension with plinth support behind 2x20 (interspersed throughout session).  - single leg bridge, 2x15 each side (lacks full range due to strength deficits)  -  double leg hooklying bridge 2x10 for full range - physioball sitting (green): 3kg ball chest press x15, trunk rotation x15 bilat (minguard assist)  - standing glute pull through, 20# cable, 2x15. Cuing for hinge, form.  - standing lumbar extension with plinth support behind 1x20 - narrow stance airex pad self toss catch x15 (3kg ball)  - standing squat with BUE support on treadmill bar 1x15     [employer] (balance, power, proprioception, functional  movement):  - forwards then sideways twisting with 3kg med ball while standing on large dynadisc pad, 1x10 each direction, 1x10 forward    HOME EXERCISE PROGRAM Access Code: [email or web address] URL: [email or web address] Date: 02/26/2021 Prepared by: Rosita Kea   Exercises Supine Double Knee to Chest - 1 x daily - 2 sets - 10 reps - 5 seconds hold Supine Lower Trunk Rotation - 1 x daily - 2 sets - 10 reps - 1-5 seconds hold Supine Quadratus Lumborum Stretch - 1 x daily - 2 reps - 30 seconds hold Standing Lumbar Extension with Counter - 2-4 x daily - 1-2 sets - 10 reps - 1 second hold Bridge with Hip Abduction and Resistance - 1 x daily - 1 sets - 20 reps - 5seconds hold Seated Posture with Lumbar Roll Runner's Step Up/Down - 2-3 x weekly - 2 sets - 10 reps Single Leg Bridge - 2-3 x weekly - 2 sets - 10 reps - 2-3 seconds hold Squat with Counter Support - 2-3 x weekly - 1 sets - 10 reps Jumping Jacks - 2-3 x weekly - 2 sets - 10 reps   [email or web address] [id number] Home Exercise Program [[id number]]   Lumbar Extension in Standing -  Repeat 10 Times, Hold 2 Seconds, Complete 2 Sets, Perform 5 Times a Day   PT Short Term Goals - 01/15/21 1514      PT SHORT TERM GOAL #1   Title Be independent with initial home exercise program for self-management of symptoms.    Baseline initial HEP to be established at visit 2 as appropriate (12/26/2020);    Time 3    Period Weeks    Status Achieved    Target Date 01/16/21             PT Long Term Goals - 02/05/21 1313      PT LONG TERM GOAL #1   Title Be independent with a long-term home exercise program for self-management of symptoms.    Baseline initial HEP to be established at visit 2 as appropriate (12/26/2020); currently participating in appropriate HEP (02/05/2021);    Time 12    Period Weeks    Status Partially Met   TARGET DATE FOR ALL LONG TERM GOALS: 03/20/2021     PT LONG TERM GOAL #2   Title Demonstrate improved FOTO score to equal or  greater than 69 to demonstrate improvement in overall condition and self-reported functional ability.    Baseline 59 (12/26/2020); 65 (02/05/2021);    Time 12    Period Weeks    Status Partially Met      PT LONG TERM GOAL #3   Title Have full lumbar spine AROM with no compensations or increase in pain in all planes except intermittent end range discomfort to allow patient to complete valued activities with less difficulty.    Baseline painful and limited - see objective exam (12/26/2020); improving slightly - see objective exam (02/05/2021);    Time 12    Period Weeks    Status Partially Met  PT LONG TERM GOAL #4   Title Reduce pain with functional activities to equal or less than 1/10 to allow patient to complete usual activities including ADLs, IADLs, and social engagement with less difficulty.    Baseline 7/10 (12/26/2020); 3/10 (02/05/2021);    Time 12    Period Weeks    Status Partially Met      PT LONG TERM GOAL #5   Title Complete community, work and/or recreational activities without limitation due to current condition.    Baseline limiting ability to complete her usual activities including shopping, errands, anything she is doing out and about, straightening up, activities at home without difficulty (12/26/2020); much improved - she can go shopping, grocery store, etc without having the sudden debilitating pain, but she has difficulty hiking in the mountains and play at the beach, [place] (02/05/2021);    Time 12    Period Weeks    Status Partially Met                 Plan - 03/12/21 1307    Clinical Impression Statement Continued with current plan of care as laid out in evaluation and recent prior sessions. Pt remains motivated to advance progress toward goals. Rest breaks provided as needed, pt quick to ask when needed. Author maintains all interventions within appropriate level of intensity as not to purposefully exacerbate pain. Pt does require varying levels of  assistance and cuing for completion of exercises for correct form and sometimes due to pain/weakness. Pt continues to demonstrate progress toward goals AEB progression of some interventions this date either in volume or intensity. No updates to HEP this date.    Personal Factors and Comorbidities Age;Comorbidity 3+;Time since onset of injury/illness/exacerbation;Past/Current Experience;Fitness    Comorbidities Relevant past medical history and comorbidities include chronic allergic asthma (epi pen in car), acute sinusitis (two surgeries), skin spot removed, hypertension, dental issues, reflux, hx of cardiac catheterization to prove she doesn't have CHF (heart was healthy), cholecystectomy, osteoarthritis, hx L knee pain (baker's cyst/meniscus).    Examination-Activity Limitations Bend;Locomotion Level;Transfers;Lift;Stand;Carry;Squat;Stairs    Examination-Participation Restrictions Community Activity;Interpersonal Relationship;Laundry;Shop;Driving;Yard Work    [employer]chant [employer] officer Evolving/Moderate complexity    Clinical Decision Making Moderate    Rehab Potential Good    PT Frequency 2x / week    PT Duration 12 weeks    PT Treatment/Interventions ADLs/Self Care Home Management;Aquatic Therapy;Cryotherapy;Moist Heat;Electrical Stimulation;Therapeutic activities;Gait training;Therapeutic exercise;Neuromuscular re-education;Patient/family education;Passive range of motion;Dry needling;Spinal Manipulations;Joint Manipulations;Manual techniques    PT Home Exercise Plan Medbridge Access Code: [email or web address] ; [email or web address]  [id number] Home Exercise Program [phone])    Consulted and Agree with Plan of Care Patient           Patient will benefit from skilled therapeutic intervention in order to improve the following deficits and impairments:  Pain,Decreased coordination,Decreased mobility,Increased muscle spasms,Postural dysfunction,Decreased activity tolerance,Decreased endurance,Decreased range of  motion,Decreased strength,Hypomobility,Impaired perceived functional ability,Difficulty walking  Visit Diagnosis: Chronic right-sided low back pain, unspecified whether sciatica present  Difficulty in walking, not elsewhere classified     Problem List Patient Active Problem List   Diagnosis Date Noted  . Low back pain 11/07/2020  . Dysuria 10/24/2020  . Pain, dental 09/29/2020  . Hypercholesterolemia 04/08/2020  . Left knee pain 04/08/2020  . COVID-19 virus infection 10/31/2019  . Stress 09/25/2019  . Joint stiffness of hand 06/26/2018  . Plantar fasciitis 06/26/2018  . Left leg pain 02/21/2018  . Hyperglycemia 02/21/2018  . Visual disturbance 02/20/2016  . Near syncope 02/20/2016  .  Right lower quadrant pain 08/04/2015  . Abnormal liver function tests 08/04/2015  . UTI symptoms 03/31/2015  . Health care maintenance 02/03/2015  . Cough 12/21/2014  . History of colonic polyps 05/29/2014  . Leukopenia 07/07/2013  . Asthma 10/10/2012  . Allergic rhinitis 10/10/2012  . GERD (gastroesophageal reflux disease) 10/10/2012  . Hypertension 10/10/2012  1:41 PM, 03/12/21 Etta Grandchild, PT, DPT Physical Therapist - [place] [phone] (Office)    Jahzeel Poythress C 03/12/2021, 1:10 PM  [facility] [address] [place], [address] Phone: [phone]   Fax:  [phone]  Name: Chloe Moyer MRN: [id number] Date of Birth: 1951/06/18

## 2021-03-14 ENCOUNTER — Other Ambulatory Visit: Payer: Self-pay

## 2021-03-14 ENCOUNTER — Ambulatory Visit: Payer: PPO | Admitting: Physical Therapy

## 2021-03-14 ENCOUNTER — Encounter: Payer: Self-pay | Admitting: Physical Therapy

## 2021-03-14 DIAGNOSIS — R262 Difficulty in walking, not elsewhere classified: Secondary | ICD-10-CM

## 2021-03-14 DIAGNOSIS — M545 Low back pain, unspecified: Secondary | ICD-10-CM

## 2021-03-14 DIAGNOSIS — G8929 Other chronic pain: Secondary | ICD-10-CM

## 2021-03-14 NOTE — Therapy (Signed)
[facility] [address] [place], [address] Phone: [phone]   Fax:  [phone]  Physical Therapy Treatment  Patient Details  Name: JOYCELYN Moyer MRN: [id number] Date of Birth: [birth year] Referring Provider (PT): Einar Pheasant, MD   Encounter Date: 03/14/2021   PT End of Session - 03/14/21 1036    Visit Number 19    Number of Visits 24    Date for PT Re-Evaluation 03/20/21    Authorization Type [employer] reporting period from 02/05/2021    PT Start Time 1034    PT Stop Time 1114    PT Time Calculation (min) 40 min    Equipment Utilized During Treatment Gait belt    Activity Tolerance Patient limited by pain;Patient tolerated treatment well    Behavior During Therapy [facility] for tasks assessed/performed           Past Medical History:  Diagnosis Date  . Allergic rhinitis   . Arthritis   . Asthma   . Diverticulosis, sigmoid   . GERD (gastroesophageal reflux disease)   . History of hiatal hernia   . Hypertension 06-12-2005  . Ulcer     Past Surgical History:  Procedure Laterality Date  . BREAST BIOPSY Right 09/01/2017   Affirm Bx of 2 areas- both areas were fibroadenomatoid change   . BREAST CYST ASPIRATION Left 1990  . CARDIAC CATHETERIZATION  July 2012  . CHOLECYSTECTOMY  1991  . LIPOMA EXCISION  4/99   Left flank area  . NASAL SINUS SURGERY  July 2012  . TUBAL LIGATION  1984    There were no vitals filed for this visit.   Subjective Assessment - 03/14/21 1034    Subjective Patient reports she is feeling well wiht 1/10 in the familiar location in the R low back. Her knees were sore after last session but are feeling pretty good today. She carreid 10 cakes down stairs at church this morning which was progress for sure. She took 5 trips. She turned over in bed last night and it pulled in her low back so she got up and streched and this helped.    Pertinent History Patient is a  70 y.o. female who presents to outpatient physical therapy with a referral for medical diagnosis Right-sided low back pain without sciatica, unspecified chronicity. This patient's chief complaints consist of right sided intermittent low back pain with single instance of radiation to the R posterior thigh with onset in the last 3 months leading to the following functional deficits: disrupts shopping, errands, anything she is doing out and about, difficulty straightening up, delays activities at home Relevant past medical history and comorbidities include chronic allergic asthma (epi pen in car), acute sinusitis (two surgeries), skin spot removed, hypertension, dental issues, reflux, hx of cardiac catheterization to prove she doesn't have CHF (heart was healthy), cholecystectomy, osteoarthritis, hx L knee pain (baker's cyst/meniscus).  Patient denies hx of cancer, stroke, seizures, lung problem (besides asthma), major cardiac events, diabetes, unexplained weight loss, changes in bowel or bladder problems, new onset stumbling or dropping things, low back surgeryor bladder problems, new onset stumbling or dropping things, low back surgery.    Currently in Pain? Yes    Pain Score 1     Pain Location Back    Pain Orientation Right              TREATMENT:    Therapeutic exercise:to centralize symptoms and improve ROM, strength, muscular endurance, and  activity tolerance required for successful completion of functional activities.  - standing lumbar extension with plinth support behind1x20 - single leg bridge,2x15each side - double leg bridge focusing on full range, 1x25 - physioball sitting (green): 3kg ball chest press x15, trunk rotation 2x15 bilat (minguard assist)  - standing glute pull through,20# cable, 2x15.Cuing for hinge, form. - kettelbell swings with 10# KB (cuing for posterior chain flexion/extension coordination and technique), 2x10-15.  - standing lumbar extension with plinth  support behind1x20  Manual therapy: to reduce pain and tissue tension, improve range of motion, neuromodulation, in order to promote improved ability to complete functional activities. - prone STM to lumbar paraspinals, R > L.   Modality: (unbilled) Dry needling performed to right lower lumbar paraspinals to decrease pain and spasms along patient's right low back region with patient in prone utilizing (1) dry needle(s) .2m x 727mwith (3) sticks at right lumbar multifidi at ~ L4 and L5 and right lumbar Iliocostalis at ~ L4. Patient educated about the risks and benefits from therapy and verbally consents to treatment.  Dry needling performed by SaEverlean AlstromSnGraylon GoodT, DPT who is certified in this technique.  HOME EXERCISE PROGRAM Access Code: [email or web address]: [email or web address] Date: 02/26/2021 Prepared by: SaRosita KeaExercises Supine Double Knee to Chest - 1 x daily - 2 sets - 10 reps - 5 seconds hold Supine Lower Trunk Rotation - 1 x daily - 2 sets - 10 reps - 1-5 seconds hold Supine Quadratus Lumborum Stretch - 1 x daily - 2 reps - 30 seconds hold Standing Lumbar Extension with Counter - 2-4 x daily - 1-2 sets - 10 reps - 1 second hold Bridge with Hip Abduction and Resistance - 1 x daily - 1 sets - 20 reps - 5seconds hold Seated Posture with Lumbar Roll Runner's Step Up/Down - 2-3 x weekly - 2 sets - 10 reps Single Leg Bridge - 2-3 x weekly - 2 sets - 10 reps - 2-3 seconds hold Squat with Counter Support - 2-3 x weekly - 1 sets - 10 reps Jumping Jacks - 2-3 x weekly - 2 sets - 10 reps  [email or web address]]Lumbar Extension in Standing - Repeat 10 Times, Hold 2 Seconds, Complete 2 Sets, Perform 5 Times a Day    PT Education - 03/14/21 1036    Education Details therex form/technique,    Person(s) Educated Patient    Methods Explanation;Demonstration;Tactile cues;Verbal cues    Comprehension Verbalized understanding;Returned  demonstration;Verbal cues required;Tactile cues required;Need further instruction            PT Short Term Goals - 01/15/21 1514      PT SHORT TERM GOAL #1   Title Be independent with initial home exercise program for self-management of symptoms.    Baseline initial HEP to be established at visit 2 as appropriate (12/26/2020);    Time 3    Period Weeks    Status Achieved    Target Date 01/16/21             PT Long Term Goals - 02/05/21 1313      PT LONG TERM GOAL #1   Title Be independent with a long-term home exercise program for self-management of symptoms.    Baseline initial HEP to be established at visit 2 as appropriate (12/26/2020); currently participating in appropriate HEP (02/05/2021);    Time 12    Period Weeks    Status Partially Met   TARGET DATE FOR ALL  LONG TERM GOALS: 03/20/2021     PT LONG TERM GOAL #2   Title Demonstrate improved FOTO score to equal or greater than 69 to demonstrate improvement in overall condition and self-reported functional ability.    Baseline 59 (12/26/2020); 65 (02/05/2021);    Time 12    Period Weeks    Status Partially Met      PT LONG TERM GOAL #3   Title Have full lumbar spine AROM with no compensations or increase in pain in all planes except intermittent end range discomfort to allow patient to complete valued activities with less difficulty.    Baseline painful and limited - see objective exam (12/26/2020); improving slightly - see objective exam (02/05/2021);    Time 12    Period Weeks    Status Partially Met      PT LONG TERM GOAL #4   Title Reduce pain with functional activities to equal or less than 1/10 to allow patient to complete usual activities including ADLs, IADLs, and social engagement with less difficulty.    Baseline 7/10 (12/26/2020); 3/10 (02/05/2021);    Time 12    Period Weeks    Status Partially Met      PT LONG TERM GOAL #5   Title Complete community, work and/or recreational activities without limitation due to  current condition.    Baseline limiting ability to complete her usual activities including shopping, errands, anything she is doing out and about, straightening up, activities at home without difficulty (12/26/2020); much improved - she can go shopping, grocery store, etc without having the sudden debilitating pain, but she has difficulty hiking in the mountains and play at the beach, [place] (02/05/2021);    Time 12    Period Weeks    Status Partially Met                 Plan - 03/14/21 1501    Clinical Impression Statement Patient tolerated treatment well overall and was able to progress to kettle bell swing. Does have difficulty coordinating posterior chain flexion/extension. Continues to benefit from dry needling and pain has not yet completely resolved. Plan to formal progress note next session.  Patient would benefit from continued management of limiting condition by skilled physical therapist to address remaining impairments and functional limitations to work towards stated goals and return to PLOF or maximal functional independence.    Personal Factors and Comorbidities Age;Comorbidity 3+;Time since onset of injury/illness/exacerbation;Past/Current Experience;Fitness    Comorbidities Relevant past medical history and comorbidities include chronic allergic asthma (epi pen in car), acute sinusitis (two surgeries), skin spot removed, hypertension, dental issues, reflux, hx of cardiac catheterization to prove she doesn't have CHF (heart was healthy), cholecystectomy, osteoarthritis, hx L knee pain (baker's cyst/meniscus).    Examination-Activity Limitations Bend;Locomotion Level;Transfers;Lift;Stand;Carry;Squat;Stairs    Examination-Participation Restrictions Community Activity;Interpersonal Relationship;Laundry;Shop;Driving;Yard Work    [employer] Evolving/Moderate complexity    Rehab Potential Good    PT Frequency 2x / week    PT Duration 12 weeks    PT  Treatment/Interventions ADLs/Self Care Home Management;Aquatic Therapy;Cryotherapy;Moist Heat;Electrical Stimulation;Therapeutic activities;Gait training;Therapeutic exercise;Neuromuscular re-education;Patient/family education;Passive range of motion;Dry needling;Spinal Manipulations;Joint Manipulations;Manual techniques    PT Next Visit Plan manual therapy, trunk strengthening, hip strengthening, functional strengthening    PT Home Exercise Plan Medbridge Access Code: [email or web address] ; [email or web address]  [id number] Home Exercise Program [phone])    Consulted and Agree with Plan of Care Patient           Patient will benefit from skilled  therapeutic intervention in order to improve the following deficits and impairments:  Pain,Decreased coordination,Decreased mobility,Increased muscle spasms,Postural dysfunction,Decreased activity tolerance,Decreased endurance,Decreased range of motion,Decreased strength,Hypomobility,Impaired perceived functional ability,Difficulty walking  Visit Diagnosis: Chronic right-sided low back pain, unspecified whether sciatica present  Difficulty in walking, not elsewhere classified     Problem List Patient Active Problem List   Diagnosis Date Noted  . Low back pain 11/07/2020  . Dysuria 10/24/2020  . Pain, dental 09/29/2020  . Hypercholesterolemia 04/08/2020  . Left knee pain 04/08/2020  . COVID-19 virus infection 10/31/2019  . Stress 09/25/2019  . Joint stiffness of hand 06/26/2018  . Plantar fasciitis 06/26/2018  . Left leg pain 02/21/2018  . Hyperglycemia 02/21/2018  . Visual disturbance 02/20/2016  . Near syncope 02/20/2016  . Right lower quadrant pain 08/04/2015  . Abnormal liver function tests 08/04/2015  . UTI symptoms 03/31/2015  . Health care maintenance 02/03/2015  . Cough 12/21/2014  . History of colonic polyps 05/29/2014  . Leukopenia 07/07/2013  . Asthma 10/10/2012  . Allergic rhinitis 10/10/2012  . GERD (gastroesophageal reflux disease)  10/10/2012  . Hypertension 10/10/2012   Everlean Alstrom. Graylon Good, PT, DPT 03/14/21, 3:02 PM  [facility] [address], [place], [address] Phone: [phone]   Fax:  [phone]  Name: Chloe Moyer MRN: [id number] Date of Birth: [birth year]

## 2021-03-19 ENCOUNTER — Other Ambulatory Visit: Payer: Self-pay

## 2021-03-19 ENCOUNTER — Telehealth: Payer: Self-pay

## 2021-03-19 ENCOUNTER — Encounter: Payer: Self-pay | Admitting: Physical Therapy

## 2021-03-19 ENCOUNTER — Ambulatory Visit: Payer: PPO | Attending: Internal Medicine | Admitting: Physical Therapy

## 2021-03-19 DIAGNOSIS — M25561 Pain in right knee: Secondary | ICD-10-CM | POA: Diagnosis not present

## 2021-03-19 DIAGNOSIS — G8929 Other chronic pain: Secondary | ICD-10-CM | POA: Diagnosis not present

## 2021-03-19 DIAGNOSIS — M25562 Pain in left knee: Secondary | ICD-10-CM

## 2021-03-19 DIAGNOSIS — M545 Low back pain, unspecified: Secondary | ICD-10-CM

## 2021-03-19 DIAGNOSIS — M6281 Muscle weakness (generalized): Secondary | ICD-10-CM | POA: Diagnosis not present

## 2021-03-19 DIAGNOSIS — R262 Difficulty in walking, not elsewhere classified: Secondary | ICD-10-CM | POA: Diagnosis not present

## 2021-03-19 NOTE — Telephone Encounter (Signed)
Pt called and would states that she finished PT today but now her two knees are hurting her and the physical therapist Rosita Kea at [facility] outpatient rehab told patient to please call pcp for referral for knees and she would be happy to work with pt.

## 2021-03-19 NOTE — Therapy (Signed)
[facility] [address], [place], [address] Phone: [phone]   Fax:  [phone]  Physical Therapy Treatment / Discharge Summary Dates of reporting: 12/26/2020 - 03/19/2021  Patient Details  Name: Chloe Moyer MRN: [id number] Date of Birth: Feb 09, 1951 Referring Provider (PT): Einar Pheasant, MD   Encounter Date: 03/19/2021   PT End of Session - 03/19/21 1307    Visit Number 20    Number of Visits 24    Date for PT Re-Evaluation 03/20/21    Authorization Type [employer] reporting period from 02/05/2021    Progress Note Due on Visit 20    PT Start Time 1300    PT Stop Time 1340    PT Time Calculation (min) 40 min    Equipment Utilized During Treatment Gait belt    Activity Tolerance Patient limited by pain;Patient tolerated treatment well    Behavior During Therapy [facility] for tasks assessed/performed           Past Medical History:  Diagnosis Date  . Allergic rhinitis   . Arthritis   . Asthma   . Diverticulosis, sigmoid   . GERD (gastroesophageal reflux disease)   . History of hiatal hernia   . Hypertension 06-12-2005  . Ulcer     Past Surgical History:  Procedure Laterality Date  . BREAST BIOPSY Right 09/01/2017   Affirm Bx of 2 areas- both areas were fibroadenomatoid change   . BREAST CYST ASPIRATION Left 1990  . CARDIAC CATHETERIZATION  July 2012  . CHOLECYSTECTOMY  1991  . LIPOMA EXCISION  4/99   Left flank area  . NASAL SINUS SURGERY  July 2012  . TUBAL LIGATION  1984    There were no vitals filed for this visit.   Subjective Assessment - 03/19/21 1305    Subjective Patient reports she is feeling well and has no pain upon arrival. States she did well with helping her granddaughter with prom and doing a bake sale this past weekend. She feels comfortable with discharging today from PT. Felt okay after last treatment session but continues to be limited by knee pain and would like  to come back to PT for her knees. Reports highest pain 1-1.5/10 in the last week.    Pertinent History Patient is a 70 y.o. female who presents to outpatient physical therapy with a referral for medical diagnosis Right-sided low back pain without sciatica, unspecified chronicity. This patient's chief complaints consist of right sided intermittent low back pain with single instance of radiation to the R posterior thigh with onset in the last 3 months leading to the following functional deficits: disrupts shopping, errands, anything she is doing out and about, difficulty straightening up, delays activities at home Relevant past medical history and comorbidities include chronic allergic asthma (epi pen in car), acute sinusitis (two surgeries), skin spot removed, hypertension, dental issues, reflux, hx of cardiac catheterization to prove she doesn't have CHF (heart was healthy), cholecystectomy, osteoarthritis, hx L knee pain (baker's cyst/meniscus).  Patient denies hx of cancer, stroke, seizures, lung problem (besides asthma), major cardiac events, diabetes, unexplained weight loss, changes in bowel or bladder problems, new onset stumbling or dropping things, low back surgeryor bladder problems, new onset stumbling or dropping things, low back surgery.    Patient Stated Goals "to get to where she is pain free all the time" and "to get limbered up"    Currently in Pain? No/denies  OBJECTIVE FOTO = 67 (03/19/2021)  SPINE MOTION Lumbar AROM *Indicates pain   Flexion: = fingers to toes, no pain, normal for patient.  Extension: = 70% feels good  Rotation: B = WFL no pain  Side Flexion: R = 50% no pain, L = 50% no pain    TREATMENT:   Therapeutic exercise:to centralize symptoms and improve ROM, strength, muscular endurance, and activity tolerance required for successful completion of functional activities.  - standing lumbar extension with plinth support behind2x20(before and after  needling) - standing squat with B UE support. 1x5 (disconitnued due to pain in knees).  - assessment of lumbar AROM (see above).    Manual therapy: to reduce pain and tissue tension, improve range of motion, neuromodulation, in order to promote improved ability to complete functional activities. - prone STM to lumbar paraspinals, R > L.  - CPA and R UPA to lowest lumbar segments, grade III-IV, (prone)  Modality: (unbilled) Dry needling performed to right/left lower lumbar paraspinals to decrease pain and spasms along patient's right/left low back region with patient in prone utilizing (3) dry needle(s) .22m x 718mwith (3) sticks at right lumbar multifidi at ~ L4 and L5 and right lumbar Iliocostalis at ~ L4, and (2) sticks and left lumbar multifidi at ~ L4 and L5. Patient educated about the risks and benefits from therapy and verbally consents to treatment. Dry needling performed by SaEverlean AlstromSnGraylon GoodT, DPT who is certified in this technique.  HOME EXERCISE PROGRAM Access Code: [email or web address]: [email or web address] Date: 03/19/2021 Prepared by: SaRosita KeaExercises Supine Double Knee to Chest - 1 x daily - 2 sets - 10 reps - 5 seconds hold Supine Lower Trunk Rotation - 1 x daily - 2 sets - 10 reps - 1-5 seconds hold Supine Quadratus Lumborum Stretch - 1 x daily - 2 reps - 30 seconds hold Standing Lumbar Extension with Counter - 2-4 x daily - 1-2 sets - 10 reps - 1 second hold Bridge with Hip Abduction and Resistance - 1 x daily - 1 sets - 20 reps - 5seconds hold Seated Posture with Lumbar Roll Runner's Step Up/Down - 2-3 x weekly - 2 sets - 10 reps Single Leg Bridge - 2-3 x weekly - 2 sets - 10 reps - 2-3 seconds hold Squat with Counter Support - 2-3 x weekly - 1 sets - 10 reps Jumping Jacks - 2-3 x weekly - 2 sets - 10 reps Standing Terminal Knee Extension with Resistance - 1 x daily - 1 sets - 10-20 reps - 4 seconds hold  [email or web address] [id number] Exercise Program  [[id number]]Lumbar Extension in Standing - Repeat 10 Times, Hold 2 Seconds, Complete 2 Sets, Perform 5 Times a Day     PT Education - 03/19/21 1306    Education Details therex form/technique, discharge reccomendations    Person(s) Educated Patient    Methods Explanation;Demonstration    Comprehension Verbalized understanding;Returned demonstration            PT Short Term Goals - 01/15/21 1514      PT SHORT TERM GOAL #1   Title Be independent with initial home exercise program for self-management of symptoms.    Baseline initial HEP to be established at visit 2 as appropriate (12/26/2020);    Time 3    Period Weeks    Status Achieved    Target Date 01/16/21             PT Long Term Goals - 03/19/21 1842  PT LONG TERM GOAL #1   Title Be independent with a long-term home exercise program for self-management of symptoms.    Baseline initial HEP to be established at visit 2 as appropriate (12/26/2020); currently participating in appropriate HEP (02/05/2021);    Time 12    Period Weeks    Status Achieved   TARGET DATE FOR ALL LONG TERM GOALS: 03/20/2021     PT LONG TERM GOAL #2   Title Demonstrate improved FOTO score to equal or greater than 69 to demonstrate improvement in overall condition and self-reported functional ability.    Baseline 59 (12/26/2020); 65 (02/05/2021); 67 (03/19/2021);    Time 12    Period Weeks    Status Partially Met      PT LONG TERM GOAL #3   Title Have full lumbar spine AROM with no compensations or increase in pain in all planes except intermittent end range discomfort to allow patient to complete valued activities with less difficulty.    Baseline painful and limited - see objective exam (12/26/2020); improving slightly - see objective exam (02/05/2021);    Time 12    Period Weeks    Status Achieved      PT LONG TERM GOAL #4   Title Reduce pain with functional activities to equal or less than 1/10 to allow patient to complete usual activities  including ADLs, IADLs, and social engagement with less difficulty.    Baseline 7/10 (12/26/2020); 3/10 (02/05/2021);    Time 12    Period Weeks    Status Achieved      PT LONG TERM GOAL #5   Title Complete community, work and/or recreational activities without limitation due to current condition.    Baseline limiting ability to complete her usual activities including shopping, errands, anything she is doing out and about, straightening up, activities at home without difficulty (12/26/2020); much improved - she can go shopping, grocery store, etc without having the sudden debilitating pain, but she has difficulty hiking in the mountains and play at the beach, [place] (02/05/2021); much improved and is now more limited by knees (03/19/2021);    Time 12    Period Weeks    Status Partially Met                 Plan - 03/19/21 1845    Clinical Impression Statement Patient has attended 20 physical therapy sessions this episode of care and made excellent progress towards goals, meeting or nearly meeting all of them and improving sufficiently for discharge. Patient does continue to have knee pain that is more limiting than back pain and she is planning to seek physical therapy for this region. Patient is independent in long term HEP and is now discharged due to improvement in condition.    Personal Factors and Comorbidities Age;Comorbidity 3+;Time since onset of injury/illness/exacerbation;Past/Current Experience;Fitness    Comorbidities Relevant past medical history and comorbidities include chronic allergic asthma (epi pen in car), acute sinusitis (two surgeries), skin spot removed, hypertension, dental issues, reflux, hx of cardiac catheterization to prove she doesn't have CHF (heart was healthy), cholecystectomy, osteoarthritis, hx L knee pain (baker's cyst/meniscus).    Examination-Activity Limitations Bend;Locomotion Level;Transfers;Lift;Stand;Carry;Squat;Stairs    Examination-Participation  Restrictions Community Activity;Interpersonal Relationship;Laundry;Shop;Driving;Yard Work    [employer] Evolving/Moderate complexity    Rehab Potential Good    PT Frequency 2x / week    PT Duration 12 weeks    PT Treatment/Interventions ADLs/Self Care Home Management;Aquatic Therapy;Cryotherapy;Moist Heat;Electrical Stimulation;Therapeutic activities;Gait training;Therapeutic exercise;Neuromuscular re-education;Patient/family  education;Passive range of motion;Dry needling;Spinal Manipulations;Joint Manipulations;Manual techniques    PT Next Visit Plan Patient is independent in long term HEP and is now discharged due to improvement in condition.    PT Home Exercise Plan Medbridge Access Code: [email or web address] ; [email or web address]  [id number] Home Exercise Program [phone])    Consulted and Agree with Plan of Care Patient           Patient will benefit from skilled therapeutic intervention in order to improve the following deficits and impairments:  Pain,Decreased coordination,Decreased mobility,Increased muscle spasms,Postural dysfunction,Decreased activity tolerance,Decreased endurance,Decreased range of motion,Decreased strength,Hypomobility,Impaired perceived functional ability,Difficulty walking  Visit Diagnosis: Chronic right-sided low back pain, unspecified whether sciatica present  Difficulty in walking, not elsewhere classified     Problem List Patient Active Problem List   Diagnosis Date Noted  . Low back pain 11/07/2020  . Dysuria 10/24/2020  . Pain, dental 09/29/2020  . Hypercholesterolemia 04/08/2020  . Left knee pain 04/08/2020  . COVID-19 virus infection 10/31/2019  . Stress 09/25/2019  . Joint stiffness of hand 06/26/2018  . Plantar fasciitis 06/26/2018  . Left leg pain 02/21/2018  . Hyperglycemia 02/21/2018  . Visual disturbance 02/20/2016  . Near syncope 02/20/2016  . Right lower quadrant pain 08/04/2015  . Abnormal liver function tests 08/04/2015  .  UTI symptoms 03/31/2015  . Health care maintenance 02/03/2015  . Cough 12/21/2014  . History of colonic polyps 05/29/2014  . Leukopenia 07/07/2013  . Asthma 10/10/2012  . Allergic rhinitis 10/10/2012  . GERD (gastroesophageal reflux disease) 10/10/2012  . Hypertension 10/10/2012   Everlean Alstrom. Graylon Good, PT, DPT 03/19/21, 6:47 PM  [facility] [address] [place], [address] Phone: [phone]   Fax:  [phone]  Name: CHONITA GADEA MRN: [id number] Date of Birth: [birth year]

## 2021-03-20 NOTE — Telephone Encounter (Signed)
Patient just finished with therapy for her back but her knees have been bothering and she would like to have a referral to PT for her knees. The therapists have already agreed to see her- just needs referral. She already has appt scheduled

## 2021-03-20 NOTE — Telephone Encounter (Signed)
I have placed the order for the PT referral.  See message.  PT requesting referral for knee pain.  See phone note.  Thanks.

## 2021-03-20 NOTE — Addendum Note (Signed)
Addended by: Alisa Graff on: 03/20/2021 05:48 PM   Modules accepted: Orders

## 2021-03-21 ENCOUNTER — Ambulatory Visit: Payer: PPO | Admitting: Physical Therapy

## 2021-03-26 ENCOUNTER — Ambulatory Visit: Payer: PPO | Admitting: Physical Therapy

## 2021-03-28 ENCOUNTER — Encounter: Payer: PPO | Admitting: Physical Therapy

## 2021-04-02 ENCOUNTER — Ambulatory Visit: Payer: PPO | Admitting: Physical Therapy

## 2021-04-02 ENCOUNTER — Other Ambulatory Visit: Payer: Self-pay

## 2021-04-02 ENCOUNTER — Encounter: Payer: Self-pay | Admitting: Physical Therapy

## 2021-04-02 DIAGNOSIS — G8929 Other chronic pain: Secondary | ICD-10-CM

## 2021-04-02 DIAGNOSIS — M545 Low back pain, unspecified: Secondary | ICD-10-CM | POA: Diagnosis not present

## 2021-04-02 DIAGNOSIS — M25561 Pain in right knee: Secondary | ICD-10-CM

## 2021-04-02 DIAGNOSIS — M25562 Pain in left knee: Secondary | ICD-10-CM

## 2021-04-02 DIAGNOSIS — R262 Difficulty in walking, not elsewhere classified: Secondary | ICD-10-CM

## 2021-04-02 DIAGNOSIS — M6281 Muscle weakness (generalized): Secondary | ICD-10-CM

## 2021-04-02 NOTE — Therapy (Signed)
[facility] [address] [place], [address] Phone: [phone]   Fax:  [phone]  Physical Therapy Evaluation  Patient Details  Name: Chloe Moyer MRN: [id number] Date of Birth: July 24, 1951 Referring Provider (PT): Einar Pheasant, MD   Encounter Date: 04/02/2021   PT End of Session - 04/02/21 1728    Visit Number 1    Number of Visits 24    Date for PT Re-Evaluation 06/25/21    Authorization Type [employer] reporting period from 04/02/2021    Progress Note Due on Visit 10    PT Start Time 1301    PT Stop Time 1345    PT Time Calculation (min) 44 min    Activity Tolerance Patient tolerated treatment well    Behavior During Therapy [facility] for tasks assessed/performed           Past Medical History:  Diagnosis Date  . Allergic rhinitis   . Arthritis   . Asthma   . Diverticulosis, sigmoid   . GERD (gastroesophageal reflux disease)   . History of hiatal hernia   . Hypertension 06-12-2005  . Ulcer     Past Surgical History:  Procedure Laterality Date  . BREAST BIOPSY Right 09/01/2017   Affirm Bx of 2 areas- both areas were fibroadenomatoid change   . BREAST CYST ASPIRATION Left 1990  . CARDIAC CATHETERIZATION  July 2012  . CHOLECYSTECTOMY  1991  . LIPOMA EXCISION  4/99   Left flank area  . NASAL SINUS SURGERY  July 2012  . TUBAL LIGATION  1984    There were no vitals filed for this visit.    Subjective Assessment - 04/02/21 1309    Subjective Patient states she has a history of meniscus trouble in her left knee. She also has a baker's cyst. She gets it drained every so often and it often helps. States the last time she had it drained was about a year ago. She just finished PT for low back pain with good results but noticed she was more limited by increasing bilateral knee pain at that time. She states she had plantar fasciitis and it her left knee started getting aggravated and then  a baker's cyst was found there. She fell may of last year and that aggravated her left knee too. The problems with her meniscus started about 2 years ago. Dr. Candelaria Stagers is the one she sees to drain her knee. She takes celebrex and tylenol occasionally for her knees. Patient is known to this clinic and PT and just finished a course of PT for her right sided low back pain.    Pertinent History Patient is a 70 y.o. female who presents to outpatient physical therapy with a referral for medical diagnosispain in both knees, unspecified chronicity. This patient's chief complaints chronic bilateral knee pain, L > R, leading to the following functional deficits:  Difficulty walking, running, playing on beach with grandson, athletic activities, stairs, sleeping, lower extremity dressing, weight bearing activities, jumping, etc, and decreased quality of life.  Relevant past medical history and comorbidities include low back pain with referral to right thigh, chronic allergic asthma (epi pen in car), acute sinusitis (two surgeries), skin spot removed, hypertension, dental issues, reflux, hx of cardiac catheterization to prove she doesn't have CHF (heart was healthy), cholecystectomy, osteoarthritis, hx L knee pain (baker's cyst/meniscus). Patient denies hx of cancer, stroke, seizures, lung problem (besides asthma), major cardiac events, diabetes, unexplained weight loss, changes in bowel  or bladder problems, new onset stumbling or dropping things, low back surgeryor bladder problems, new onset stumbling or dropping things, low back surgery, osteoporosis.    Limitations Lifting;Walking;House hold activities;Other (comment);Standing    Diagnostic tests L MRI report 11/14/2018: "IMPRESSION:  1. Radial tear of the medial meniscus posterior horn.  2. Subchondral insufficiency fracture of the medial tibial plateau.  3. Mild tricompartmental osteoarthritis.  4. Small joint effusion and Baker cyst."    Patient Stated Goals "I just  need to strengthen both knees"    Currently in Pain? No/denies   Worst: 8/10 (L), 3/10 (R). Best: 0/10 (B)   Pain Score 0-No pain    Pain Location Knee    Pain Orientation Right;Left;Medial   bilateral medial joint line. L at posterior knee. Right at lateral knee (she supsects sciatica).   Pain Descriptors / Indicators Aching;Throbbing    Pain Type Chronic pain    Pain Radiating Towards does report some referral along lateral thigh above knee on right    Pain Onset More than a month ago    Pain Frequency Intermittent    Aggravating Factors  stairs (the worst), prolonged standing, in bed, any weight bearing activity, jumping, running    Pain Relieving Factors sitting, heat, hot tub of water, tylenol/celebrex, draining cyst    Effect of Pain on Daily Activities Functional Limitations: walking, running, playing on beach with grandson, athletic activities, stairs, sleeping, lower extremity dressing, weight bearing activities, jumping.              [facility] PT Assessment - 04/02/21 1306      Assessment   Medical Diagnosis pain in both knees, unspecified chronicity    Referring Provider (PT) Einar Pheasant, MD    Onset Date/Surgical Date --   2 years ago   Hand Dominance Right    Next MD Visit July 2022    Prior Therapy previous episdode for low back 30 years ago and recently with good results      Precautions   Precautions None      Restrictions   Weight Bearing Restrictions No      Balance Screen   Has the patient fallen in the past 6 months No    Has the patient had a decrease in activity level because of a fear of falling?  No    Is the patient reluctant to leave their home because of a fear of falling?  No      Home Environment   Living Environment --   no concerns about getting around home safely     Prior Function   Level of Independence Independent    Vocation Retired    Leisure Photographer, playing with grandson on beach, gardening, walking, shopping,  running, walking, [place]      Cognition   Overall Cognitive Status Within Functional Limits for tasks assessed      Observation/Other Assessments   Focus on Therapeutic Outcomes (FOTO)  59             OBJECTIVE  OBSERVATION/INSPECTION . Mild B genuvarum, increased fullness at posterior L knee joint line.  . Tremor: none . Muscle bulk: WFL  . Bed mobility: supine <> sit and rolling WFL . Transfers: sit <> stand WFL  . Gait: ambulate with wide stance and ginger stance phase each step upon initially rising from chair in waiting room. Ipsilateral lean in stance phase.  . Stairs: NT  PERIPHERAL JOINT MOTION (in degrees)  Active Range of Motion (  AROM) *Indicates pain 04/02/21 Date Date  Joint/Motion R/L R/L R/L  Knee     Extension -5/-5 / /  Flexion 130/130 / /  Comments:   Passive Range of Motion (PROM) *Indicates pain Date Date Date  Joint/Motion R/L R/L R/L  Knee     Extension -5/-5 / /  Flexion 130*/133* / /  Comments:  Knee flexion: R pain over anterior knee, L pain posterior knee. B hips appear [facility] for basic mobility, right hip stiffer in flexion/IR/ER by about 20% compared to left.   MUSCLE PERFORMANCE (MMT):  *Indicates pain 04/02/21 Date Date  Joint/Motion R/L R/L R/L  Hip     Flexion 4/5 / /  Extension (knee ext) 4/4+ / /  Flexion (knee flex) / / /  Abduction 4-/4 / /  Adduction 3/3 / /  External rotation 4+/4 / /  Internal rotation  4/4 / /  Knee     Extension 4+*/4+* / /  Flexion 4+*/4* / /  Comments:  B ankles WFL.R knee extension: pain over patella. L knee extension: pain at medial joint line.  R knee flexion: pain over patella, L knee flexion: pain at posterior knee. R hip IR: pain at medial knee. L hip IR/ER: pain at medial joint line. R hip flexion: pain at medial knee.   ACCESSORY MOTION:  - B femoraltibial joints mild hypomobilty  PALPATION:  L knee: TTP at posterolateral joint line, medial and lateral distal hamstring tendons, medial to  anterior joint line. Fullness noted at posterior joint line (non-tender).   R knee: TTP at distal medial and lateral hamstring tendons. Reports concordant pain as at medial joint line when it is hurting (not currently).    EDUCATION/COGNITION: Patient is alert and oriented X 4.  Objective measurements completed on examination: See above findings.     TREATMENT:   Therapeutic exercise: to centralize symptoms and improve ROM, strength, muscular endurance, and activity tolerance required for successful completion of functional activities.  - supine A SLR, 1x10 each side - sidelying hip abduction with slight extension and IR, 1x10 each side  - Education on HEP including handout  - Education on diagnosis, prognosis, POC, anatomy and physiology of current condition.   Pt required multimodal cuing for proper technique and to facilitate improved neuromuscular control, strength, range of motion, and functional ability resulting in improved performance and form.  HOME EXERCISE PROGRAM  Access Code: [email or web address] URL: [email or web address] Date: 04/02/2021 Prepared by: Rosita Kea  Exercises Supine Active Straight Leg Raise - 1 x daily - 2 sets - 10 reps - 3 seconds hold Sidelying Hip Abduction - 1 x daily - 2 sets - 10 reps - 3 seconds hold     PT Education - 04/02/21 1728    Education Details Exercise purpose/form. Self management techniques. Education on diagnosis, prognosis, POC, anatomy and physiology of current condition Education on HEP including handout    Person(s) Educated Patient    Methods Explanation;Demonstration;Tactile cues;Verbal cues;Handout    Comprehension Verbalized understanding;Returned demonstration;Verbal cues required;Tactile cues required;Need further instruction            PT Short Term Goals - 04/02/21 1735      PT SHORT TERM GOAL #1   Title Be independent with initial home exercise program for self-management of symptoms.    Baseline Initial  HEP provided at IE (04/02/2021);    Time 2    Period Weeks    Status Achieved    Target Date 04/16/21  PT Long Term Goals - 04/02/21 1736      PT LONG TERM GOAL #1   Title Be independent with a long-term home exercise program for self-management of symptoms.    Baseline Initial HEP provided at IE (04/02/2021);    Time 12    Period Weeks    Status New   TARGET DATE FOR ALL LONG TERM GOALS: 06/25/2021     PT LONG TERM GOAL #2   Title Demonstrate improved FOTO score to equal or greater than 63 by visit #12 to demonstrate improvement in overall condition and self-reported functional ability.    Baseline 59 (04/02/2021);    Time 12    Period Weeks    Status New      PT LONG TERM GOAL #3   Title Improve B hip and knee strength to 4+/5 with no increase in pain to improve patient's ability to complete  valued functional tasks such hiking, stairs, running with less difficulty.    Baseline painful and weak - see objective exam (04/02/2021);    Time 12    Period Weeks    Status New      PT LONG TERM GOAL #4   Title Reduce pain with functional activities to equal or less than 1/10 to allow patient to complete usual activities including ADLs, IADLs, and social engagement with less difficulty.    Baseline 8/10 (04/02/2021);    Time 12    Period Weeks    Status New      PT LONG TERM GOAL #5   Title Complete community, work and/or recreational activities without limitation due to current condition.    Baseline Functional Limitations: walking, running, playing on beach with grandson, athletic activities, stairs, sleeping, lower extremity dressing, weight bearing activities, jumping (04/02/2021);    Time 12    Period Weeks    Status New                  Plan - 04/02/21 1743    Clinical Impression Statement Patient is a 70 y.o. female referred to outpatient physical therapy with a medical diagnosis of pain in both knees, unspecified chronicity who presents with signs and  symptoms consistent with chronic bilateral knee pain L > R. Patient presents with significant pain, posture, ROM, joint stiffness, muscle tension, balance, muscle performance (strength/power/endurance), and activity tolerance impairments that are limiting ability to complete her usual activities including walking, running, playing on beach with grandson, athletic activities, stairs, sleeping, lower extremity dressing, weight bearing activities, jumping, etc, without difficulty and decreases her quality of life. Patient will benefit from skilled physical therapy intervention to address current body structure impairments and activity limitations to improve function and work towards goals set in current POC in order to return to prior level of function or maximal functional improvement.    Personal Factors and Comorbidities Age;Comorbidity 3+;Time since onset of injury/illness/exacerbation;Past/Current Experience    Comorbidities Relevant past medical history and comorbidities include low back pain with referral to right thigh, chronic allergic asthma (epi pen in car), acute sinusitis (two surgeries), skin spot removed, hypertension, dental issues, reflux, hx of cardiac catheterization to prove she doesn't have CHF (heart was healthy), cholecystectomy, osteoarthritis, hx L knee pain (baker's cyst/meniscus).    Examination-Activity Limitations Locomotion Level;Transfers;Stand;Carry;Squat;Stairs;Dressing;Sleep;Lift    Examination-Participation Restrictions Community Activity;Interpersonal Relationship;Laundry;Shop;Yard Work   Difficulty walking, running, playing on beach with grandson, athletic activities, stairs, sleeping, lower extremity dressing, weight bearing activities, jumping, etc, and decreased quality of life.   Stability/Clinical Decision  Making Evolving/Moderate complexity    Clinical Decision Making Moderate    Rehab Potential Good    PT Frequency 2x / week    PT Duration 12 weeks    PT  Treatment/Interventions ADLs/Self Care Home Management;Aquatic Therapy;Cryotherapy;Moist Heat;Electrical Stimulation;Therapeutic activities;Gait training;Therapeutic exercise;Neuromuscular re-education;Patient/family education;Passive range of motion;Dry needling;Spinal Manipulations;Joint Manipulations;Manual techniques    PT Next Visit Plan update HEP as appropriate, B LE and functional strengthening/balance as tolerated    PT Home Exercise Plan Medbridge  Access Code: [email or web address]    Consulted and Agree with Plan of Care Patient           Patient will benefit from skilled therapeutic intervention in order to improve the following deficits and impairments:  Pain,Decreased coordination,Decreased mobility,Increased muscle spasms,Postural dysfunction,Decreased activity tolerance,Decreased endurance,Decreased range of motion,Decreased strength,Hypomobility,Impaired perceived functional ability,Difficulty walking,Abnormal gait,Improper body mechanics,Impaired flexibility,Decreased balance  Visit Diagnosis: Chronic pain of left knee  Chronic pain of right knee  Difficulty in walking, not elsewhere classified  Muscle weakness (generalized)     Problem List Patient Active Problem List   Diagnosis Date Noted  . Low back pain 11/07/2020  . Dysuria 10/24/2020  . Pain, dental 09/29/2020  . Hypercholesterolemia 04/08/2020  . Left knee pain 04/08/2020  . COVID-19 virus infection 10/31/2019  . Stress 09/25/2019  . Joint stiffness of hand 06/26/2018  . Plantar fasciitis 06/26/2018  . Left leg pain 02/21/2018  . Hyperglycemia 02/21/2018  . Visual disturbance 02/20/2016  . Near syncope 02/20/2016  . Right lower quadrant pain 08/04/2015  . Abnormal liver function tests 08/04/2015  . UTI symptoms 03/31/2015  . Health care maintenance 02/03/2015  . Cough 12/21/2014  . History of colonic polyps 05/29/2014  . Leukopenia 07/07/2013  . Asthma 10/10/2012  . Allergic rhinitis 10/10/2012  . GERD  (gastroesophageal reflux disease) 10/10/2012  . Hypertension 10/10/2012    Everlean Alstrom. Graylon Good, PT, DPT 04/02/21, 5:47 PM  [facility] [address], [place], [address] Phone: [phone]   Fax:  [phone]  Name: Chloe Moyer MRN: [id number] Date of Birth: [birth year]

## 2021-04-04 ENCOUNTER — Other Ambulatory Visit: Payer: Self-pay

## 2021-04-04 ENCOUNTER — Ambulatory Visit: Payer: PPO | Admitting: Physical Therapy

## 2021-04-04 ENCOUNTER — Encounter: Payer: Self-pay | Admitting: Physical Therapy

## 2021-04-04 DIAGNOSIS — R262 Difficulty in walking, not elsewhere classified: Secondary | ICD-10-CM

## 2021-04-04 DIAGNOSIS — G8929 Other chronic pain: Secondary | ICD-10-CM

## 2021-04-04 DIAGNOSIS — M545 Low back pain, unspecified: Secondary | ICD-10-CM | POA: Diagnosis not present

## 2021-04-04 DIAGNOSIS — M6281 Muscle weakness (generalized): Secondary | ICD-10-CM

## 2021-04-04 DIAGNOSIS — M25562 Pain in left knee: Secondary | ICD-10-CM

## 2021-04-04 NOTE — Therapy (Signed)
[facility] [address], [place], [address] Phone: [phone]   Fax:  [phone]  Physical Therapy Treatment  Patient Details  Name: Chloe Moyer MRN: [id number] Date of Birth: May 25, 1951 Referring Provider (PT): Dale Wauregan, MD   Encounter Date: 04/04/2021   PT End of Session - 04/04/21 0950    Visit Number 2    Number of Visits 24    Date for PT Re-Evaluation 06/25/21    Authorization Type [employer] reporting period from 04/02/2021    Progress Note Due on Visit 10    PT Start Time 0947    PT Stop Time 1027    PT Time Calculation (min) 40 min    Activity Tolerance Patient tolerated treatment well    Behavior During Therapy [facility] for tasks assessed/performed           Past Medical History:  Diagnosis Date  . Allergic rhinitis   . Arthritis   . Asthma   . Diverticulosis, sigmoid   . GERD (gastroesophageal reflux disease)   . History of hiatal hernia   . Hypertension 06-12-2005  . Ulcer     Past Surgical History:  Procedure Laterality Date  . BREAST BIOPSY Right 09/01/2017   Affirm Bx of 2 areas- both areas were fibroadenomatoid change   . BREAST CYST ASPIRATION Left 1990  . CARDIAC CATHETERIZATION  July 2012  . CHOLECYSTECTOMY  1991  . LIPOMA EXCISION  4/99   Left flank area  . NASAL SINUS SURGERY  July 2012  . TUBAL LIGATION  1984    There were no vitals filed for this visit.   Subjective Assessment - 04/04/21 0948    Subjective Patient reports she has no pain in her knees currently. She used the weed-eater yesterday and didn't even have pain this evening. She states her back was really sore after last session and today it "knotty" it felt better after doing some of her back exercise. Her niebor's husband passed away yesterday and she went over to sit  with her until the funeral home came to pick up the body. She did not sleep well last night afterwards.    Pertinent  History Patient is a 70 y.o. female who presents to outpatient physical therapy with a referral for medical diagnosispain in both knees, unspecified chronicity. This patient's chief complaints chronic bilateral knee pain, L > R, leading to the following functional deficits:  Difficulty walking, running, playing on beach with grandson, athletic activities, stairs, sleeping, lower extremity dressing, weight bearing activities, jumping, etc, and decreased quality of life.  Relevant past medical history and comorbidities include low back pain with referral to right thigh, chronic allergic asthma (epi pen in car), acute sinusitis (two surgeries), skin spot removed, hypertension, dental issues, reflux, hx of cardiac catheterization to prove she doesn't have CHF (heart was healthy), cholecystectomy, osteoarthritis, hx L knee pain (baker's cyst/meniscus). Patient denies hx of cancer, stroke, seizures, lung problem (besides asthma), major cardiac events, diabetes, unexplained weight loss, changes in bowel or bladder problems, new onset stumbling or dropping things, low back surgeryor bladder problems, new onset stumbling or dropping things, low back surgery, osteoporosis.    Limitations Lifting;Walking;House hold activities;Other (comment);Standing    Diagnostic tests L MRI report 11/14/2018: "IMPRESSION:  1. Radial tear of the medial meniscus posterior horn.  2. Subchondral insufficiency fracture of the medial tibial plateau.  3. Mild tricompartmental osteoarthritis.  4. Small joint effusion and Baker cyst."  Patient Stated Goals "I just need to strengthen both knees"    Currently in Pain? Yes    Pain Score 2     Pain Location Back    Pain Orientation Right    Pain Onset More than a month ago            TREATMENT:   Therapeutic exercise:to centralize symptoms and improve ROM, strength, muscular endurance, and activity tolerance required for successful completion of functional activities.  - supine  active SLR, 2x10 each side, 3 sec hold. - wall sit, 1x5 with 15 second hold (one set with theraball behind - not preferred).  - hooklying single leg bridge, 1x10 each side - hooklying bridge with hip abduction with green theraband around knees, 1x20 with 3 second hold. (reports medium difficulty).  - prone press up 1 x10 (able to complete but difficulty for arms, educated in lock and sag for last few reps with improved lumbar extension) - prone abdominal brace with alternating hip extension, 2x10 (mild irritation to R low back ).  - seated LAQ, 2x10 each side with 5# AW (hanging weight feels good) - standing lumbar extension over plinth, 1x10 - Education on HEP including handout   Pt required multimodal cuing for proper technique and to facilitate improved neuromuscular control, strength, range of motion, and functional ability resulting in improved performance and form.  Manual therapy: to reduce pain and tissue tension, improve range of motion, neuromodulation, in order to promote improved ability to complete functional activities. PRONE - STM to lumbar spine focusing on right paraspinal region near L4-L5  Modality: (unbilled) Dry needling performed to right low back to decrease pain and spasms along patient's lumbar region with patient in prone utilizing (1) dry needle(s) .48mm x 28mm with (3) sticks at right lumbar multifidi and Iliocostalis from L4-L5 . Patient educated about the risks and benefits from therapy and verbally consents to treatment.  Dry needling performed by Everlean Alstrom. Graylon Good PT, DPT who is certified in this technique.    HOME EXERCISE PROGRAM Access Code: [email or web address] URL: [email or web address] Date: 04/04/2021 Prepared by: Rosita Kea  Exercises Bridge with Hip Abduction and Resistance - Ground Touches - 1 sets - 20 reps - 3 seconds hold Wall Sit - 1 x daily - 1 sets - 5 reps - 15 seconds hold    PT Education - 04/04/21 0950    Education Details  Exercise purpose/form. Self management techniques    Person(s) Educated Patient    Methods Explanation;Demonstration;Tactile cues;Verbal cues    Comprehension Verbalized understanding;Returned demonstration;Verbal cues required;Tactile cues required;Need further instruction            PT Short Term Goals - 04/02/21 1735      PT SHORT TERM GOAL #1   Title Be independent with initial home exercise program for self-management of symptoms.    Baseline Initial HEP provided at IE (04/02/2021);    Time 2    Period Weeks    Status Achieved    Target Date 04/16/21             PT Long Term Goals - 04/02/21 1736      PT LONG TERM GOAL #1   Title Be independent with a long-term home exercise program for self-management of symptoms.    Baseline Initial HEP provided at IE (04/02/2021);    Time 12    Period Weeks    Status New   TARGET DATE FOR ALL LONG TERM GOALS: 06/25/2021     PT LONG  TERM GOAL #2   Title Demonstrate improved FOTO score to equal or greater than 63 by visit #12 to demonstrate improvement in overall condition and self-reported functional ability.    Baseline 59 (04/02/2021);    Time 12    Period Weeks    Status New      PT LONG TERM GOAL #3   Title Improve B hip and knee strength to 4+/5 with no increase in pain to improve patient's ability to complete  valued functional tasks such hiking, stairs, running with less difficulty.    Baseline painful and weak - see objective exam (04/02/2021);    Time 12    Period Weeks    Status New      PT LONG TERM GOAL #4   Title Reduce pain with functional activities to equal or less than 1/10 to allow patient to complete usual activities including ADLs, IADLs, and social engagement with less difficulty.    Baseline 8/10 (04/02/2021);    Time 12    Period Weeks    Status New      PT LONG TERM GOAL #5   Title Complete community, work and/or recreational activities without limitation due to current condition.    Baseline Functional  Limitations: walking, running, playing on beach with grandson, athletic activities, stairs, sleeping, lower extremity dressing, weight bearing activities, jumping (04/02/2021);    Time 12    Period Weeks    Status New                 Plan - 04/04/21 1546    Clinical Impression Statement Patient tolerated exercises well overall at the knees but low back pain is limiting her from completing all desired hip/knee exercises so took a little time to address low back pain. Has responded well to dry needling in the past so utilized this today. Updated HEP to more tolerable exercises that were not as irritating to low back. Patient tolerated gentle loading of the B quads well. May benefit from some distraction and joint mobilizations for pain at the B knees next session. Patient would benefit from continued management of limiting condition by skilled physical therapist to address remaining impairments and functional limitations to work towards stated goals and return to PLOF or maximal functional independence.    Personal Factors and Comorbidities Age;Comorbidity 3+;Time since onset of injury/illness/exacerbation;Past/Current Experience    Comorbidities Relevant past medical history and comorbidities include low back pain with referral to right thigh, chronic allergic asthma (epi pen in car), acute sinusitis (two surgeries), skin spot removed, hypertension, dental issues, reflux, hx of cardiac catheterization to prove she doesn't have CHF (heart was healthy), cholecystectomy, osteoarthritis, hx L knee pain (baker's cyst/meniscus).    Examination-Activity Limitations Locomotion Level;Transfers;Stand;Carry;Squat;Stairs;Dressing;Sleep;Lift    Examination-Participation Restrictions Community Activity;Interpersonal Relationship;Laundry;Shop;Yard Work   Difficulty walking, running, playing on beach with grandson, athletic activities, stairs, sleeping, lower extremity dressing, weight bearing activities, jumping,  etc, and decreased quality of life.   Stability/Clinical Decision Making Evolving/Moderate complexity    Rehab Potential Good    PT Frequency 2x / week    PT Duration 12 weeks    PT Treatment/Interventions ADLs/Self Care Home Management;Aquatic Therapy;Cryotherapy;Moist Heat;Electrical Stimulation;Therapeutic activities;Gait training;Therapeutic exercise;Neuromuscular re-education;Patient/family education;Passive range of motion;Dry needling;Spinal Manipulations;Joint Manipulations;Manual techniques    PT Next Visit Plan update HEP as appropriate, B LE and functional strengthening/balance as tolerated    PT Home Exercise Plan Medbridge  Access Code: [email or web address]    Consulted and Agree with Plan of Care Patient  Patient will benefit from skilled therapeutic intervention in order to improve the following deficits and impairments:  Pain,Decreased coordination,Decreased mobility,Increased muscle spasms,Postural dysfunction,Decreased activity tolerance,Decreased endurance,Decreased range of motion,Decreased strength,Hypomobility,Impaired perceived functional ability,Difficulty walking,Abnormal gait,Improper body mechanics,Impaired flexibility,Decreased balance  Visit Diagnosis: Chronic pain of left knee  Chronic pain of right knee  Difficulty in walking, not elsewhere classified  Muscle weakness (generalized)     Problem List Patient Active Problem List   Diagnosis Date Noted  . Low back pain 11/07/2020  . Dysuria 10/24/2020  . Pain, dental 09/29/2020  . Hypercholesterolemia 04/08/2020  . Left knee pain 04/08/2020  . COVID-19 virus infection 10/31/2019  . Stress 09/25/2019  . Joint stiffness of hand 06/26/2018  . Plantar fasciitis 06/26/2018  . Left leg pain 02/21/2018  . Hyperglycemia 02/21/2018  . Visual disturbance 02/20/2016  . Near syncope 02/20/2016  . Right lower quadrant pain 08/04/2015  . Abnormal liver function tests 08/04/2015  . UTI symptoms 03/31/2015  .  Health care maintenance 02/03/2015  . Cough 12/21/2014  . History of colonic polyps 05/29/2014  . Leukopenia 07/07/2013  . Asthma 10/10/2012  . Allergic rhinitis 10/10/2012  . GERD (gastroesophageal reflux disease) 10/10/2012  . Hypertension 10/10/2012    Everlean Alstrom. Graylon Good, PT, DPT 04/04/21, 3:47 PM  [place] [facility] [address], [place], [address] Phone: [phone]   Fax:  [phone]  Name: LENNYX VERDELL MRN: [id number] Date of Birth: 03/22/1951

## 2021-04-08 ENCOUNTER — Other Ambulatory Visit: Payer: Self-pay

## 2021-04-08 ENCOUNTER — Ambulatory Visit: Payer: PPO | Admitting: Physical Therapy

## 2021-04-08 DIAGNOSIS — R262 Difficulty in walking, not elsewhere classified: Secondary | ICD-10-CM

## 2021-04-08 DIAGNOSIS — M545 Low back pain, unspecified: Secondary | ICD-10-CM | POA: Diagnosis not present

## 2021-04-08 DIAGNOSIS — G8929 Other chronic pain: Secondary | ICD-10-CM

## 2021-04-08 DIAGNOSIS — M6281 Muscle weakness (generalized): Secondary | ICD-10-CM

## 2021-04-08 NOTE — Therapy (Signed)
[place] [facility] [address], [place], [address] Phone: [phone]   Fax:  [phone]  Physical Therapy Treatment  Patient Details  Name: Chloe Moyer MRN: [id number] Date of Birth: 04/23/1951 Referring Provider (PT): Einar Pheasant, MD   Encounter Date: 04/08/2021   PT End of Session - 04/08/21 1106    Visit Number 3    Number of Visits 24    Date for PT Re-Evaluation 06/25/21    Authorization Type [employer] reporting period from 04/02/2021    Progress Note Due on Visit 10    PT Start Time 0950    PT Stop Time 1030    PT Time Calculation (min) 40 min    Activity Tolerance Patient tolerated treatment well    Behavior During Therapy [facility] for tasks assessed/performed           Past Medical History:  Diagnosis Date  . Allergic rhinitis   . Arthritis   . Asthma   . Diverticulosis, sigmoid   . GERD (gastroesophageal reflux disease)   . History of hiatal hernia   . Hypertension 06-12-2005  . Ulcer     Past Surgical History:  Procedure Laterality Date  . BREAST BIOPSY Right 09/01/2017   Affirm Bx of 2 areas- both areas were fibroadenomatoid change   . BREAST CYST ASPIRATION Left 1990  . CARDIAC CATHETERIZATION  July 2012  . CHOLECYSTECTOMY  1991  . LIPOMA EXCISION  4/99   Left flank area  . NASAL SINUS SURGERY  July 2012  . TUBAL LIGATION  1984    There were no vitals filed for this visit.   Subjective Assessment - 04/08/21 0952    Subjective Patinet reports she is doing pretty well today. States she has 2/10 pain in her posterior R knee and medial L knee. States she went to the beach and was walking on the sand and did okay. She climbed a lot of steps and it was really hard, especially with some new long carpet that was put on them. State she felt okay after last session and has been doing her HEP without difficulty. Back is not bothering her today.    Pertinent History Patient is a  70 y.o. female who presents to outpatient physical therapy with a referral for medical diagnosispain in both knees, unspecified chronicity. This patient's chief complaints chronic bilateral knee pain, L > R, leading to the following functional deficits:  Difficulty walking, running, playing on beach with grandson, athletic activities, stairs, sleeping, lower extremity dressing, weight bearing activities, jumping, etc, and decreased quality of life.  Relevant past medical history and comorbidities include low back pain with referral to right thigh, chronic allergic asthma (epi pen in car), acute sinusitis (two surgeries), skin spot removed, hypertension, dental issues, reflux, hx of cardiac catheterization to prove she doesn't have CHF (heart was healthy), cholecystectomy, osteoarthritis, hx L knee pain (baker's cyst/meniscus). Patient denies hx of cancer, stroke, seizures, lung problem (besides asthma), major cardiac events, diabetes, unexplained weight loss, changes in bowel or bladder problems, new onset stumbling or dropping things, low back surgeryor bladder problems, new onset stumbling or dropping things, low back surgery, osteoporosis.    Limitations Lifting;Walking;House hold activities;Other (comment);Standing    Diagnostic tests L MRI report 11/14/2018: "IMPRESSION:  1. Radial tear of the medial meniscus posterior horn.  2. Subchondral insufficiency fracture of the medial tibial plateau.  3. Mild tricompartmental osteoarthritis.  4. Small joint effusion and Baker cyst."  Patient Stated Goals "I just need to strengthen both knees"    Currently in Pain? Yes    Pain Score 2     Pain Onset More than a month ago           TREATMENT:  Therapeutic exercise:to centralize symptoms and improve ROM, strength, muscular endurance, and activity tolerance required for successful completion of functional activities. - supine active SLR, 2x10 each side, 3 sec hold. - wall sit, 1x10 with 10-15 second  hold - hooklying single leg bridge, 1x10 each side, 3 second hold. - hooklying bridge with hip abduction with black theraband around knees, 1x20 with 3 second hold, practicing breathing while keeping abdominal muscles activated. (reports medium difficulty).  - prone press up 1x10, 1x5 (lock and sag for last few reps first set with improved lumbar extension) (challenging due to arm fatigue).  - seated LAQ, 2x10 each side yellow theraband loop, 1x10 on each side with 3 sec holds.  - Education on HEP including handout   Pt required multimodal cuing for proper technique and to facilitate improved neuromuscular control, strength, range of motion, and functional ability resulting in improved performance and form.   HOME EXERCISE PROGRAM Access Code: [email or web address] URL: [email or web address] Date: 04/08/2021 Prepared by: Rosita Kea  Exercises Bridge with Hip Abduction and Resistance - Ground Touches - 1 sets - 20 reps - 3 seconds hold Wall Sit - 1 x daily - 1 sets - 5 reps - 15 seconds hold Sitting Knee Extension with Resistance - 1 x daily - 1-3 sets - 10 reps - 3 seconds hold    PT Education - 04/08/21 1106    Education Details Exercise purpose/form. Self management techniques    Person(s) Educated Patient    Methods Explanation;Demonstration;Tactile cues;Verbal cues;Handout    Comprehension Verbalized understanding;Returned demonstration;Verbal cues required;Tactile cues required;Need further instruction            PT Short Term Goals - 04/02/21 1735      PT SHORT TERM GOAL #1   Title Be independent with initial home exercise program for self-management of symptoms.    Baseline Initial HEP provided at IE (04/02/2021);    Time 2    Period Weeks    Status Achieved    Target Date 04/16/21             PT Long Term Goals - 04/02/21 1736      PT LONG TERM GOAL #1   Title Be independent with a long-term home exercise program for self-management of symptoms.     Baseline Initial HEP provided at IE (04/02/2021);    Time 12    Period Weeks    Status New   TARGET DATE FOR ALL LONG TERM GOALS: 06/25/2021     PT LONG TERM GOAL #2   Title Demonstrate improved FOTO score to equal or greater than 63 by visit #12 to demonstrate improvement in overall condition and self-reported functional ability.    Baseline 59 (04/02/2021);    Time 12    Period Weeks    Status New      PT LONG TERM GOAL #3   Title Improve B hip and knee strength to 4+/5 with no increase in pain to improve patient's ability to complete  valued functional tasks such hiking, stairs, running with less difficulty.    Baseline painful and weak - see objective exam (04/02/2021);    Time 12    Period Weeks    Status New  PT LONG TERM GOAL #4   Title Reduce pain with functional activities to equal or less than 1/10 to allow patient to complete usual activities including ADLs, IADLs, and social engagement with less difficulty.    Baseline 8/10 (04/02/2021);    Time 12    Period Weeks    Status New      PT LONG TERM GOAL #5   Title Complete community, work and/or recreational activities without limitation due to current condition.    Baseline Functional Limitations: walking, running, playing on beach with grandson, athletic activities, stairs, sleeping, lower extremity dressing, weight bearing activities, jumping (04/02/2021);    Time 12    Period Weeks    Status New                 Plan - 04/08/21 1105    Clinical Impression Statement Patient tolerated treatment well overall and was able to progress on several exercises. Reports she feels her quad strength is improving and was able to complete exercises without lasting pain. Does continue to report some recurrent pain at the right low back so appropriate rest given to accommodate. Patient would benefit from continued management of limiting condition by skilled physical therapist to address remaining impairments and functional  limitations to work towards stated goals and return to PLOF or maximal functional independence.    Personal Factors and Comorbidities Age;Comorbidity 3+;Time since onset of injury/illness/exacerbation;Past/Current Experience    Comorbidities Relevant past medical history and comorbidities include low back pain with referral to right thigh, chronic allergic asthma (epi pen in car), acute sinusitis (two surgeries), skin spot removed, hypertension, dental issues, reflux, hx of cardiac catheterization to prove she doesn't have CHF (heart was healthy), cholecystectomy, osteoarthritis, hx L knee pain (baker's cyst/meniscus).    Examination-Activity Limitations Locomotion Level;Transfers;Stand;Carry;Squat;Stairs;Dressing;Sleep;Lift    Examination-Participation Restrictions Community Activity;Interpersonal Relationship;Laundry;Shop;Yard Work   Difficulty walking, running, playing on beach with grandson, athletic activities, stairs, sleeping, lower extremity dressing, weight bearing activities, jumping, etc, and decreased quality of life.   Stability/Clinical Decision Making Evolving/Moderate complexity    Rehab Potential Good    PT Frequency 2x / week    PT Duration 12 weeks    PT Treatment/Interventions ADLs/Self Care Home Management;Aquatic Therapy;Cryotherapy;Moist Heat;Electrical Stimulation;Therapeutic activities;Gait training;Therapeutic exercise;Neuromuscular re-education;Patient/family education;Passive range of motion;Dry needling;Spinal Manipulations;Joint Manipulations;Manual techniques    PT Next Visit Plan update HEP as appropriate, B LE and functional strengthening/balance as tolerated    PT Home Exercise Plan Medbridge  Access Code: [email or web address]    Consulted and Agree with Plan of Care Patient           Patient will benefit from skilled therapeutic intervention in order to improve the following deficits and impairments:  Pain,Decreased coordination,Decreased mobility,Increased muscle  spasms,Postural dysfunction,Decreased activity tolerance,Decreased endurance,Decreased range of motion,Decreased strength,Hypomobility,Impaired perceived functional ability,Difficulty walking,Abnormal gait,Improper body mechanics,Impaired flexibility,Decreased balance  Visit Diagnosis: Chronic pain of left knee  Chronic pain of right knee  Difficulty in walking, not elsewhere classified  Muscle weakness (generalized)     Problem List Patient Active Problem List   Diagnosis Date Noted  . Low back pain 11/07/2020  . Dysuria 10/24/2020  . Pain, dental 09/29/2020  . Hypercholesterolemia 04/08/2020  . Left knee pain 04/08/2020  . COVID-19 virus infection 10/31/2019  . Stress 09/25/2019  . Joint stiffness of hand 06/26/2018  . Plantar fasciitis 06/26/2018  . Left leg pain 02/21/2018  . Hyperglycemia 02/21/2018  . Visual disturbance 02/20/2016  . Near syncope 02/20/2016  . Right lower quadrant pain  08/04/2015  . Abnormal liver function tests 08/04/2015  . UTI symptoms 03/31/2015  . Health care maintenance 02/03/2015  . Cough 12/21/2014  . History of colonic polyps 05/29/2014  . Leukopenia 07/07/2013  . Asthma 10/10/2012  . Allergic rhinitis 10/10/2012  . GERD (gastroesophageal reflux disease) 10/10/2012  . Hypertension 10/10/2012    Everlean Alstrom. Graylon Good, PT, DPT 04/08/21, 11:08 AM  [place] [facility] [address], [place], [address] Phone: [phone]   Fax:  [phone]  Name: Chloe Moyer MRN: [id number] Date of Birth: 01-Dec-1950

## 2021-04-10 ENCOUNTER — Encounter: Payer: Self-pay | Admitting: Physical Therapy

## 2021-04-10 ENCOUNTER — Other Ambulatory Visit: Payer: Self-pay

## 2021-04-10 ENCOUNTER — Ambulatory Visit: Payer: PPO | Admitting: Physical Therapy

## 2021-04-10 DIAGNOSIS — R262 Difficulty in walking, not elsewhere classified: Secondary | ICD-10-CM

## 2021-04-10 DIAGNOSIS — M6281 Muscle weakness (generalized): Secondary | ICD-10-CM

## 2021-04-10 DIAGNOSIS — M25562 Pain in left knee: Secondary | ICD-10-CM

## 2021-04-10 DIAGNOSIS — M545 Low back pain, unspecified: Secondary | ICD-10-CM | POA: Diagnosis not present

## 2021-04-10 DIAGNOSIS — G8929 Other chronic pain: Secondary | ICD-10-CM

## 2021-04-10 DIAGNOSIS — M25561 Pain in right knee: Secondary | ICD-10-CM

## 2021-04-10 NOTE — Therapy (Signed)
[facility] [address] [place], [address] Phone: [phone]   Fax:  [phone]  Physical Therapy Treatment  Patient Details  Name: Chloe Moyer MRN: [id number] Date of Birth: 1951/10/19 Referring Provider (PT): Einar Pheasant, MD   Encounter Date: 04/10/2021   PT End of Session - 04/10/21 0912    Visit Number 4    Number of Visits 24    Date for PT Re-Evaluation 06/25/21    Authorization Type [employer] reporting period from 04/02/2021    Progress Note Due on Visit 10    PT Start Time 0900    PT Stop Time 0940    PT Time Calculation (min) 40 min    Activity Tolerance Patient tolerated treatment well    Behavior During Therapy [facility] for tasks assessed/performed           Past Medical History:  Diagnosis Date  . Allergic rhinitis   . Arthritis   . Asthma   . Diverticulosis, sigmoid   . GERD (gastroesophageal reflux disease)   . History of hiatal hernia   . Hypertension 06-12-2005  . Ulcer     Past Surgical History:  Procedure Laterality Date  . BREAST BIOPSY Right 09/01/2017   Affirm Bx of 2 areas- both areas were fibroadenomatoid change   . BREAST CYST ASPIRATION Left 1990  . CARDIAC CATHETERIZATION  July 2012  . CHOLECYSTECTOMY  1991  . LIPOMA EXCISION  4/99   Left flank area  . NASAL SINUS SURGERY  July 2012  . TUBAL LIGATION  1984    There were no vitals filed for this visit.   Subjective Assessment - 04/10/21 0907    Subjective Pateint reports she is feeling well with no knee pain upon arrival; Can feel a little discomfort in her right lower back. States she has been doing her long arc quad exercise with the band and had a little pain during the night after but it is feeling okay now. Also had some mild pain in the knee joints folloiwng last treatment session. Did the LAQ on a bar stool and band was pretty tight. She went to [employer] yesterday and felt she could move so much  better and can tell her muscles are getting stronger.    Pertinent History Patient is a 70 y.o. female who presents to outpatient physical therapy with a referral for medical diagnosispain in both knees, unspecified chronicity. This patient's chief complaints chronic bilateral knee pain, L > R, leading to the following functional deficits:  Difficulty walking, running, playing on beach with grandson, athletic activities, stairs, sleeping, lower extremity dressing, weight bearing activities, jumping, etc, and decreased quality of life.  Relevant past medical history and comorbidities include low back pain with referral to right thigh, chronic allergic asthma (epi pen in car), acute sinusitis (two surgeries), skin spot removed, hypertension, dental issues, reflux, hx of cardiac catheterization to prove she doesn't have CHF (heart was healthy), cholecystectomy, osteoarthritis, hx L knee pain (baker's cyst/meniscus). Patient denies hx of cancer, stroke, seizures, lung problem (besides asthma), major cardiac events, diabetes, unexplained weight loss, changes in bowel or bladder problems, new onset stumbling or dropping things, low back surgeryor bladder problems, new onset stumbling or dropping things, low back surgery, osteoporosis.    Limitations Lifting;Walking;House hold activities;Other (comment);Standing    Diagnostic tests L MRI report 11/14/2018: "IMPRESSION:  1. Radial tear of the medial meniscus posterior horn.  2. Subchondral insufficiency fracture of the medial  tibial plateau.  3. Mild tricompartmental osteoarthritis.  4. Small joint effusion and Baker cyst."    Patient Stated Goals "I just need to strengthen both knees"    Currently in Pain? Yes    Pain Score 1     Pain Location Back    Pain Orientation Right    Pain Onset More than a month ago           TREATMENT:  Therapeutic exercise:to centralize symptoms and improve ROM, strength, muscular endurance, and activity tolerance required  for successful completion of functional activities. - supineactiveSLR, 2x10 each side, 5# AW, towel roll behind knee to encourage strong quad set, 3 sec hold. - wall sit, 2x5 with 10 second hold - hooklying single leg bridge, 2x10 each side, 3 second hold. - hooklying bridge with hip abduction with black theraband around knees, 1x20 with 3 second hold.  - prone press up 1x10, 1x5 (lock and sag for last few reps first set with improved lumbar extension) (challenging due to arm fatigue).   - seated LAQ, 2x10 each side yellow theraband loop, 1x10 on each side with 3 sec holds.   - Education on HEP including handout   Pt required multimodal cuing for proper technique and to facilitate improved neuromuscular control, strength, range of motion, and functional ability resulting in improved performance and form.  Manual therapy: to reduce pain and tissue tension, improve range of motion, neuromodulation, in order to promote improved ability to complete functional activities. PRONE - STM to lumbar spine focusing on right paraspinal region near L4-L5  Modality: (unbilled) Dry needling performed to right low back to decrease pain and spasms along patient's lumbar region with patient in prone utilizing (1) dry needle(s) .36mm x 21mm with (3) sticks at right lumbar multifidi and Iliocostalis from L4-L5 . Patient educated about the risks and benefits from therapy and verbally consents to treatment. Dry needling performed by Everlean Alstrom. Graylon Good PT, DPT who is certified in this technique.   HOME EXERCISE PROGRAM Access Code: [email or web address] URL: [email or web address] Date: 04/08/2021 Prepared by: Rosita Kea  Exercises Bridge with Hip Abduction and Resistance - Ground Touches - 1 sets - 20 reps - 3 seconds hold Wall Sit - 1 x daily - 1 sets - 5 reps - 15 seconds hold Sitting Knee Extension with Resistance - 1 x daily - 1-3 sets - 10 reps - 3 seconds hold     PT Education - 04/10/21  0912    Education Details Exercise purpose/form. Self management techniques    Person(s) Educated Patient    Methods Explanation;Demonstration;Tactile cues;Verbal cues    Comprehension Verbalized understanding;Returned demonstration;Verbal cues required;Tactile cues required;Need further instruction            PT Short Term Goals - 04/02/21 1735      PT SHORT TERM GOAL #1   Title Be independent with initial home exercise program for self-management of symptoms.    Baseline Initial HEP provided at IE (04/02/2021);    Time 2    Period Weeks    Status Achieved    Target Date 04/16/21             PT Long Term Goals - 04/02/21 1736      PT LONG TERM GOAL #1   Title Be independent with a long-term home exercise program for self-management of symptoms.    Baseline Initial HEP provided at IE (04/02/2021);    Time 12    Period Weeks    Status New  TARGET DATE FOR ALL LONG TERM GOALS: 06/25/2021     PT LONG TERM GOAL #2   Title Demonstrate improved FOTO score to equal or greater than 63 by visit #12 to demonstrate improvement in overall condition and self-reported functional ability.    Baseline 59 (04/02/2021);    Time 12    Period Weeks    Status New      PT LONG TERM GOAL #3   Title Improve B hip and knee strength to 4+/5 with no increase in pain to improve patient's ability to complete  valued functional tasks such hiking, stairs, running with less difficulty.    Baseline painful and weak - see objective exam (04/02/2021);    Time 12    Period Weeks    Status New      PT LONG TERM GOAL #4   Title Reduce pain with functional activities to equal or less than 1/10 to allow patient to complete usual activities including ADLs, IADLs, and social engagement with less difficulty.    Baseline 8/10 (04/02/2021);    Time 12    Period Weeks    Status New      PT LONG TERM GOAL #5   Title Complete community, work and/or recreational activities without limitation due to current  condition.    Baseline Functional Limitations: walking, running, playing on beach with grandson, athletic activities, stairs, sleeping, lower extremity dressing, weight bearing activities, jumping (04/02/2021);    Time 12    Period Weeks    Status New                 Plan - 04/10/21 0925    Clinical Impression Statement Patient tolerated treatment well and continues to progress in LE strengthening and activity tolerance. Dry needling performed to low back to help prevent difficulty completing knee exercises due to back pain that is controlled well with needling. Adjusted LAQ exercise in HEP to be more comfortable. Patient would benefit from continued management of limiting condition by skilled physical therapist to address remaining impairments and functional limitations to work towards stated goals and return to PLOF or maximal functional independence.    Personal Factors and Comorbidities Age;Comorbidity 3+;Time since onset of injury/illness/exacerbation;Past/Current Experience    Comorbidities Relevant past medical history and comorbidities include low back pain with referral to right thigh, chronic allergic asthma (epi pen in car), acute sinusitis (two surgeries), skin spot removed, hypertension, dental issues, reflux, hx of cardiac catheterization to prove she doesn't have CHF (heart was healthy), cholecystectomy, osteoarthritis, hx L knee pain (baker's cyst/meniscus).    Examination-Activity Limitations Locomotion Level;Transfers;Stand;Carry;Squat;Stairs;Dressing;Sleep;Lift    Examination-Participation Restrictions Community Activity;Interpersonal Relationship;Laundry;Shop;Yard Work   Difficulty walking, running, playing on beach with grandson, athletic activities, stairs, sleeping, lower extremity dressing, weight bearing activities, jumping, etc, and decreased quality of life.   Stability/Clinical Decision Making Evolving/Moderate complexity    Rehab Potential Good    PT Frequency 2x /  week    PT Duration 12 weeks    PT Treatment/Interventions ADLs/Self Care Home Management;Aquatic Therapy;Cryotherapy;Moist Heat;Electrical Stimulation;Therapeutic activities;Gait training;Therapeutic exercise;Neuromuscular re-education;Patient/family education;Passive range of motion;Dry needling;Spinal Manipulations;Joint Manipulations;Manual techniques    PT Next Visit Plan update HEP as appropriate, B LE and functional strengthening/balance as tolerated    PT Home Exercise Plan Medbridge  Access Code: [email or web address]    Consulted and Agree with Plan of Care Patient           Patient will benefit from skilled therapeutic intervention in order to improve the following deficits and  impairments:  Pain,Decreased coordination,Decreased mobility,Increased muscle spasms,Postural dysfunction,Decreased activity tolerance,Decreased endurance,Decreased range of motion,Decreased strength,Hypomobility,Impaired perceived functional ability,Difficulty walking,Abnormal gait,Improper body mechanics,Impaired flexibility,Decreased balance  Visit Diagnosis: Chronic pain of left knee  Chronic pain of right knee  Difficulty in walking, not elsewhere classified  Muscle weakness (generalized)     Problem List Patient Active Problem List   Diagnosis Date Noted  . Low back pain 11/07/2020  . Dysuria 10/24/2020  . Pain, dental 09/29/2020  . Hypercholesterolemia 04/08/2020  . Left knee pain 04/08/2020  . COVID-19 virus infection 10/31/2019  . Stress 09/25/2019  . Joint stiffness of hand 06/26/2018  . Plantar fasciitis 06/26/2018  . Left leg pain 02/21/2018  . Hyperglycemia 02/21/2018  . Visual disturbance 02/20/2016  . Near syncope 02/20/2016  . Right lower quadrant pain 08/04/2015  . Abnormal liver function tests 08/04/2015  . UTI symptoms 03/31/2015  . Health care maintenance 02/03/2015  . Cough 12/21/2014  . History of colonic polyps 05/29/2014  . Leukopenia 07/07/2013  . Asthma 10/10/2012  .  Allergic rhinitis 10/10/2012  . GERD (gastroesophageal reflux disease) 10/10/2012  . Hypertension 10/10/2012    Everlean Alstrom. Graylon Good, PT, DPT 04/10/21, 1:03 PM  [place] [facility] [address] [place], [address] Phone: [phone]   Fax:  [phone]  Name: DREANA BRITZ MRN: [id number] Date of Birth: Aug 08, 1951

## 2021-04-16 ENCOUNTER — Encounter: Payer: Self-pay | Admitting: Physical Therapy

## 2021-04-16 ENCOUNTER — Ambulatory Visit: Payer: PPO | Admitting: Physical Therapy

## 2021-04-16 ENCOUNTER — Other Ambulatory Visit: Payer: Self-pay

## 2021-04-16 DIAGNOSIS — M545 Low back pain, unspecified: Secondary | ICD-10-CM | POA: Diagnosis not present

## 2021-04-16 DIAGNOSIS — R262 Difficulty in walking, not elsewhere classified: Secondary | ICD-10-CM

## 2021-04-16 DIAGNOSIS — G8929 Other chronic pain: Secondary | ICD-10-CM

## 2021-04-16 DIAGNOSIS — M6281 Muscle weakness (generalized): Secondary | ICD-10-CM

## 2021-04-16 NOTE — Therapy (Signed)
[place] [facility] [address], [place], [address] Phone: [phone]   Fax:  [phone]  Physical Therapy Treatment  Patient Details  Name: Chloe Moyer MRN: [id number] Date of Birth: [birth year] Referring Provider (PT): Einar Pheasant, MD   Encounter Date: 04/16/2021   PT End of Session - 04/16/21 1044    Visit Number 5    Number of Visits 24    Date for PT Re-Evaluation 06/25/21    Authorization Type [employer] reporting period from 04/02/2021    Progress Note Due on Visit 10    PT Start Time 1040    PT Stop Time 1118    PT Time Calculation (min) 38 min    Activity Tolerance Patient tolerated treatment well    Behavior During Therapy [facility] for tasks assessed/performed           Past Medical History:  Diagnosis Date  . Allergic rhinitis   . Arthritis   . Asthma   . Diverticulosis, sigmoid   . GERD (gastroesophageal reflux disease)   . History of hiatal hernia   . Hypertension 06-12-2005  . Ulcer     Past Surgical History:  Procedure Laterality Date  . BREAST BIOPSY Right 09/01/2017   Affirm Bx of 2 areas- both areas were fibroadenomatoid change   . BREAST CYST ASPIRATION Left 1990  . CARDIAC CATHETERIZATION  July 2012  . CHOLECYSTECTOMY  1991  . LIPOMA EXCISION  4/99   Left flank area  . NASAL SINUS SURGERY  July 2012  . TUBAL LIGATION  1984    There were no vitals filed for this visit.   Subjective Assessment - 04/16/21 1040    Subjective Patient reports things went well at the beach. She can go up the steps about 5 normally with great concentration and with intermittant pain later. Back did great. Knee pain is currently 1/10 in R medial knee. Does not remember how she felt after last session.    Pertinent History Patient is a 70 y.o. female who presents to outpatient physical therapy with a referral for medical diagnosispain in both knees, unspecified chronicity. This patient's  chief complaints chronic bilateral knee pain, L > R, leading to the following functional deficits:  Difficulty walking, running, playing on beach with grandson, athletic activities, stairs, sleeping, lower extremity dressing, weight bearing activities, jumping, etc, and decreased quality of life.  Relevant past medical history and comorbidities include low back pain with referral to right thigh, chronic allergic asthma (epi pen in car), acute sinusitis (two surgeries), skin spot removed, hypertension, dental issues, reflux, hx of cardiac catheterization to prove she doesn't have CHF (heart was healthy), cholecystectomy, osteoarthritis, hx L knee pain (baker's cyst/meniscus). Patient denies hx of cancer, stroke, seizures, lung problem (besides asthma), major cardiac events, diabetes, unexplained weight loss, changes in bowel or bladder problems, new onset stumbling or dropping things, low back surgeryor bladder problems, new onset stumbling or dropping things, low back surgery, osteoporosis.    Limitations Lifting;Walking;House hold activities;Other (comment);Standing    Diagnostic tests L MRI report 11/14/2018: "IMPRESSION:  1. Radial tear of the medial meniscus posterior horn.  2. Subchondral insufficiency fracture of the medial tibial plateau.  3. Mild tricompartmental osteoarthritis.  4. Small joint effusion and Baker cyst."    Patient Stated Goals "I just need to strengthen both knees"    Currently in Pain? Yes    Pain Score 1     Pain Onset More than a  month ago           TREATMENT:  Therapeutic exercise:to centralize symptoms and improve ROM, strength, muscular endurance, and activity tolerance required for successful completion of functional activities. - supineactiveSLR, 2x10 each side, 5# AW, towel roll behind knee to encourage strong quad set, 3 sec hold. - seated LAQ, 2x10 each sidewith 2#AW, limited ROM extension R knee due to pain. - wall sit, 1x10with 10-15 second hold -  hooklying single leg bridge, 2x10 each side, 3 second hold. - hooklying bridge with hip abduction withblacktheraband around knees, 1x20 with 3 second hold. - prone press up1x15,(lock and sag for last few reps first setwith improved lumbar extension)(challenging due to arm fatigue).  Pt required multimodal cuing for proper technique and to facilitate improved neuromuscular control, strength, range of motion, and functional ability resulting in improved performance and form.     HOME EXERCISE PROGRAM Access Code: [email or web address] URL: [email or web address] Date: 04/08/2021 Prepared by: Rosita Kea  Exercises Bridge with Hip Abduction and Resistance - Ground Touches - 1 sets - 20 reps - 3 seconds hold Wall Sit - 1 x daily - 1 sets - 5 reps - 15 seconds hold Sitting Knee Extension with Resistance - 1 x daily - 1-3 sets - 10 reps - 3 seconds hold     PT Education - 04/16/21 1047    Education Details Exercise purpose/form. Self management techniques    Person(s) Educated Patient    Methods Explanation;Demonstration;Tactile cues;Verbal cues    Comprehension Verbalized understanding;Returned demonstration;Verbal cues required;Tactile cues required;Need further instruction            PT Short Term Goals - 04/02/21 1735      PT SHORT TERM GOAL #1   Title Be independent with initial home exercise program for self-management of symptoms.    Baseline Initial HEP provided at IE (04/02/2021);    Time 2    Period Weeks    Status Achieved    Target Date 04/16/21             PT Long Term Goals - 04/02/21 1736      PT LONG TERM GOAL #1   Title Be independent with a long-term home exercise program for self-management of symptoms.    Baseline Initial HEP provided at IE (04/02/2021);    Time 12    Period Weeks    Status New   TARGET DATE FOR ALL LONG TERM GOALS: 06/25/2021     PT LONG TERM GOAL #2   Title Demonstrate improved FOTO score to equal or greater than 63 by  visit #12 to demonstrate improvement in overall condition and self-reported functional ability.    Baseline 59 (04/02/2021);    Time 12    Period Weeks    Status New      PT LONG TERM GOAL #3   Title Improve B hip and knee strength to 4+/5 with no increase in pain to improve patient's ability to complete  valued functional tasks such hiking, stairs, running with less difficulty.    Baseline painful and weak - see objective exam (04/02/2021);    Time 12    Period Weeks    Status New      PT LONG TERM GOAL #4   Title Reduce pain with functional activities to equal or less than 1/10 to allow patient to complete usual activities including ADLs, IADLs, and social engagement with less difficulty.    Baseline 8/10 (04/02/2021);    Time 12    Period  Weeks    Status New      PT LONG TERM GOAL #5   Title Complete community, work and/or recreational activities without limitation due to current condition.    Baseline Functional Limitations: walking, running, playing on beach with grandson, athletic activities, stairs, sleeping, lower extremity dressing, weight bearing activities, jumping (04/02/2021);    Time 12    Period Weeks    Status New                 Plan - 04/16/21 1114    Clinical Impression Statement Patient tolerated treatment well including with slight progression of open and closed chain quad strengthening exercises. Continues to be limited by R medial knee pain but overall describes improvements in her ability to complete normal walking and activity on the beach. Patient would benefit from continued management of limiting condition by skilled physical therapist to address remaining impairments and functional limitations to work towards stated goals and return to PLOF or maximal functional independence.    Personal Factors and Comorbidities Age;Comorbidity 3+;Time since onset of injury/illness/exacerbation;Past/Current Experience    Comorbidities Relevant past medical history and  comorbidities include low back pain with referral to right thigh, chronic allergic asthma (epi pen in car), acute sinusitis (two surgeries), skin spot removed, hypertension, dental issues, reflux, hx of cardiac catheterization to prove she doesn't have CHF (heart was healthy), cholecystectomy, osteoarthritis, hx L knee pain (baker's cyst/meniscus).    Examination-Activity Limitations Locomotion Level;Transfers;Stand;Carry;Squat;Stairs;Dressing;Sleep;Lift    Examination-Participation Restrictions Community Activity;Interpersonal Relationship;Laundry;Shop;Yard Work   Difficulty walking, running, playing on beach with grandson, athletic activities, stairs, sleeping, lower extremity dressing, weight bearing activities, jumping, etc, and decreased quality of life.   Stability/Clinical Decision Making Evolving/Moderate complexity    Rehab Potential Good    PT Frequency 2x / week    PT Duration 12 weeks    PT Treatment/Interventions ADLs/Self Care Home Management;Aquatic Therapy;Cryotherapy;Moist Heat;Electrical Stimulation;Therapeutic activities;Gait training;Therapeutic exercise;Neuromuscular re-education;Patient/family education;Passive range of motion;Dry needling;Spinal Manipulations;Joint Manipulations;Manual techniques    PT Next Visit Plan update HEP as appropriate, B LE and functional strengthening/balance as tolerated    PT Home Exercise Plan Medbridge  Access Code: [email or web address]    Consulted and Agree with Plan of Care Patient           Patient will benefit from skilled therapeutic intervention in order to improve the following deficits and impairments:  Pain,Decreased coordination,Decreased mobility,Increased muscle spasms,Postural dysfunction,Decreased activity tolerance,Decreased endurance,Decreased range of motion,Decreased strength,Hypomobility,Impaired perceived functional ability,Difficulty walking,Abnormal gait,Improper body mechanics,Impaired flexibility,Decreased balance  Visit  Diagnosis: Chronic pain of left knee  Chronic pain of right knee  Difficulty in walking, not elsewhere classified  Muscle weakness (generalized)     Problem List Patient Active Problem List   Diagnosis Date Noted  . Low back pain 11/07/2020  . Dysuria 10/24/2020  . Pain, dental 09/29/2020  . Hypercholesterolemia 04/08/2020  . Left knee pain 04/08/2020  . COVID-19 virus infection 10/31/2019  . Stress 09/25/2019  . Joint stiffness of hand 06/26/2018  . Plantar fasciitis 06/26/2018  . Left leg pain 02/21/2018  . Hyperglycemia 02/21/2018  . Visual disturbance 02/20/2016  . Near syncope 02/20/2016  . Right lower quadrant pain 08/04/2015  . Abnormal liver function tests 08/04/2015  . UTI symptoms 03/31/2015  . Health care maintenance 02/03/2015  . Cough 12/21/2014  . History of colonic polyps 05/29/2014  . Leukopenia 07/07/2013  . Asthma 10/10/2012  . Allergic rhinitis 10/10/2012  . GERD (gastroesophageal reflux disease) 10/10/2012  . Hypertension 10/10/2012   Clarise Cruz  Carin Primrose, PT, DPT 04/16/21, 11:21 AM  [facility] [address] [place], [address] Phone: [phone]   Fax:  [phone]  Name: TYIANNA MENEFEE MRN: [id number] Date of Birth: [birth year]

## 2021-04-17 ENCOUNTER — Ambulatory Visit: Payer: PPO | Attending: Internal Medicine | Admitting: Physical Therapy

## 2021-04-17 ENCOUNTER — Encounter: Payer: Self-pay | Admitting: Physical Therapy

## 2021-04-17 DIAGNOSIS — M6281 Muscle weakness (generalized): Secondary | ICD-10-CM | POA: Diagnosis not present

## 2021-04-17 DIAGNOSIS — G8929 Other chronic pain: Secondary | ICD-10-CM

## 2021-04-17 DIAGNOSIS — R262 Difficulty in walking, not elsewhere classified: Secondary | ICD-10-CM

## 2021-04-17 DIAGNOSIS — M25562 Pain in left knee: Secondary | ICD-10-CM | POA: Insufficient documentation

## 2021-04-17 DIAGNOSIS — M25561 Pain in right knee: Secondary | ICD-10-CM | POA: Insufficient documentation

## 2021-04-17 DIAGNOSIS — M545 Low back pain, unspecified: Secondary | ICD-10-CM | POA: Insufficient documentation

## 2021-04-17 NOTE — Therapy (Signed)
[facility] [address], [place], [address] Phone: [phone]   Fax:  [phone]  Physical Therapy Treatment  Patient Details  Name: Chloe Moyer MRN: [id number] Date of Birth: [birth year] Referring Provider (PT): Einar Pheasant, MD   Encounter Date: 04/17/2021   PT End of Session - 04/17/21 1150    Visit Number 6    Number of Visits 24    Date for PT Re-Evaluation 06/25/21    Authorization Type [employer] reporting period from 04/02/2021    Progress Note Due on Visit 10    PT Start Time 0903    PT Stop Time 0943    PT Time Calculation (min) 40 min    Activity Tolerance Patient tolerated treatment well    Behavior During Therapy [facility] for tasks assessed/performed           Past Medical History:  Diagnosis Date  . Allergic rhinitis   . Arthritis   . Asthma   . Diverticulosis, sigmoid   . GERD (gastroesophageal reflux disease)   . History of hiatal hernia   . Hypertension 06-12-2005  . Ulcer     Past Surgical History:  Procedure Laterality Date  . BREAST BIOPSY Right 09/01/2017   Affirm Bx of 2 areas- both areas were fibroadenomatoid change   . BREAST CYST ASPIRATION Left 1990  . CARDIAC CATHETERIZATION  July 2012  . CHOLECYSTECTOMY  1991  . LIPOMA EXCISION  4/99   Left flank area  . NASAL SINUS SURGERY  July 2012  . TUBAL LIGATION  1984    There were no vitals filed for this visit.   Subjective Assessment - 04/17/21 1148    Subjective Patient reports she is sore in her legs and R low back. Rates pain 1/10. Most of the sore places are not in the familiar location on the knees. States her LAQ exercise with yellow theraband is getting easy.    Pertinent History Patient is a 70 y.o. female who presents to outpatient physical therapy with a referral for medical diagnosispain in both knees, unspecified chronicity. This patient's chief complaints chronic bilateral knee pain, L > R,  leading to the following functional deficits:  Difficulty walking, running, playing on beach with grandson, athletic activities, stairs, sleeping, lower extremity dressing, weight bearing activities, jumping, etc, and decreased quality of life.  Relevant past medical history and comorbidities include low back pain with referral to right thigh, chronic allergic asthma (epi pen in car), acute sinusitis (two surgeries), skin spot removed, hypertension, dental issues, reflux, hx of cardiac catheterization to prove she doesn't have CHF (heart was healthy), cholecystectomy, osteoarthritis, hx L knee pain (baker's cyst/meniscus). Patient denies hx of cancer, stroke, seizures, lung problem (besides asthma), major cardiac events, diabetes, unexplained weight loss, changes in bowel or bladder problems, new onset stumbling or dropping things, low back surgeryor bladder problems, new onset stumbling or dropping things, low back surgery, osteoporosis.    Limitations Lifting;Walking;House hold activities;Other (comment);Standing    Diagnostic tests L MRI report 11/14/2018: "IMPRESSION:  1. Radial tear of the medial meniscus posterior horn.  2. Subchondral insufficiency fracture of the medial tibial plateau.  3. Mild tricompartmental osteoarthritis.  4. Small joint effusion and Baker cyst."    Patient Stated Goals "I just need to strengthen both knees"    Currently in Pain? Yes    Pain Score 1     Pain Onset More than a month ago  TREATMENT:  Manual therapy: to reduce pain and tissue tension, improve range of motion, neuromodulation, in order to promote improved ability to complete functional activities. PRONE - STM to right lumbar paraspinals.  Modality: (unbilled) Dry needling performed to right lumbar region to decrease pain and spasms along patient's right low back region with patient in prone utilizing (1) dry needle(s) .43mm x 77mm with (3) sticks at R lumbar multifidi ~ L4 and L5 and R  Iliocostalis at approximately L4 . Patient educated about the risks and benefits from therapy and verbally consents to treatment.  Dry needling performed by Everlean Alstrom. Graylon Good PT, DPT who is certified in this technique.  Therapeutic exercise:to centralize symptoms and improve ROM, strength, muscular endurance, and activity tolerance required for successful completion of functional activities. - prone press up1x10, 1x5,(lock and sag for last few reps first setwith improved lumbar extension)(challenging due to arm fatigue). - standing hip hike off 2x4 board, 2x10 each side (more difficult with R side supporting), U UE support for balance. - standing RDL, 2x10, 10# KB elevated on 4 inch yoga block.  - stand lumbar extension over plinth, 1x10 - seated long arc quad against red theraband looped around foot, 2x10-15 reps each side, slow tempo.  - Education on HEP including handout   Pt required multimodal cuing for proper technique and to facilitate improved neuromuscular control, strength, range of motion, and functional ability resulting in improved performance and form.   HOME EXERCISE PROGRAM Access Code: [email or web address] URL: [email or web address] Date: 04/17/2021 Prepared by: Rosita Kea  Exercises Bridge with Hip Abduction and Resistance - Ground Touches - 1 sets - 20 reps - 3 seconds hold Wall Sit - 1 x daily - 1 sets - 5 reps - 15 seconds hold Sitting Knee Extension with Resistance - 1 x daily - 1-3 sets - 10 reps - 3 seconds hold Hip Hikes off step - 1 x daily - 2-3 sets - 10 reps    PT Education - 04/17/21 1149    Education Details Exercise purpose/form. Self management techniques    Person(s) Educated Patient    Methods Explanation;Demonstration;Tactile cues;Verbal cues;Handout    Comprehension Verbalized understanding;Returned demonstration;Verbal cues required;Tactile cues required;Need further instruction            PT Short Term Goals - 04/02/21 1735       PT SHORT TERM GOAL #1   Title Be independent with initial home exercise program for self-management of symptoms.    Baseline Initial HEP provided at IE (04/02/2021);    Time 2    Period Weeks    Status Achieved    Target Date 04/16/21             PT Long Term Goals - 04/02/21 1736      PT LONG TERM GOAL #1   Title Be independent with a long-term home exercise program for self-management of symptoms.    Baseline Initial HEP provided at IE (04/02/2021);    Time 12    Period Weeks    Status New   TARGET DATE FOR ALL LONG TERM GOALS: 06/25/2021     PT LONG TERM GOAL #2   Title Demonstrate improved FOTO score to equal or greater than 63 by visit #12 to demonstrate improvement in overall condition and self-reported functional ability.    Baseline 59 (04/02/2021);    Time 12    Period Weeks    Status New      PT LONG TERM GOAL #3   Title Improve  B hip and knee strength to 4+/5 with no increase in pain to improve patient's ability to complete  valued functional tasks such hiking, stairs, running with less difficulty.    Baseline painful and weak - see objective exam (04/02/2021);    Time 12    Period Weeks    Status New      PT LONG TERM GOAL #4   Title Reduce pain with functional activities to equal or less than 1/10 to allow patient to complete usual activities including ADLs, IADLs, and social engagement with less difficulty.    Baseline 8/10 (04/02/2021);    Time 12    Period Weeks    Status New      PT LONG TERM GOAL #5   Title Complete community, work and/or recreational activities without limitation due to current condition.    Baseline Functional Limitations: walking, running, playing on beach with grandson, athletic activities, stairs, sleeping, lower extremity dressing, weight bearing activities, jumping (04/02/2021);    Time 12    Period Weeks    Status New                 Plan - 04/17/21 1151    Clinical Impression Statement Patient tolerated treatment well  overall. Provided interventions for back pain so she could continue to work on knee-specific exercises without difficulty due to back pain. Continued working on/progressing hip and knee exercises. Patient would benefit from continued management of limiting condition by skilled physical therapist to address remaining impairments and functional limitations to work towards stated goals and return to PLOF or maximal functional independence.    Personal Factors and Comorbidities Age;Comorbidity 3+;Time since onset of injury/illness/exacerbation;Past/Current Experience    Comorbidities Relevant past medical history and comorbidities include low back pain with referral to right thigh, chronic allergic asthma (epi pen in car), acute sinusitis (two surgeries), skin spot removed, hypertension, dental issues, reflux, hx of cardiac catheterization to prove she doesn't have CHF (heart was healthy), cholecystectomy, osteoarthritis, hx L knee pain (baker's cyst/meniscus).    Examination-Activity Limitations Locomotion Level;Transfers;Stand;Carry;Squat;Stairs;Dressing;Sleep;Lift    Examination-Participation Restrictions Community Activity;Interpersonal Relationship;Laundry;Shop;Yard Work   Difficulty walking, running, playing on beach with grandson, athletic activities, stairs, sleeping, lower extremity dressing, weight bearing activities, jumping, etc, and decreased quality of life.   Stability/Clinical Decision Making Evolving/Moderate complexity    Rehab Potential Good    PT Frequency 2x / week    PT Duration 12 weeks    PT Treatment/Interventions ADLs/Self Care Home Management;Aquatic Therapy;Cryotherapy;Moist Heat;Electrical Stimulation;Therapeutic activities;Gait training;Therapeutic exercise;Neuromuscular re-education;Patient/family education;Passive range of motion;Dry needling;Spinal Manipulations;Joint Manipulations;Manual techniques    PT Next Visit Plan update HEP as appropriate, B LE and functional  strengthening/balance as tolerated    PT Home Exercise Plan Medbridge  Access Code: [email or web address]    Consulted and Agree with Plan of Care Patient           Patient will benefit from skilled therapeutic intervention in order to improve the following deficits and impairments:  Pain,Decreased coordination,Decreased mobility,Increased muscle spasms,Postural dysfunction,Decreased activity tolerance,Decreased endurance,Decreased range of motion,Decreased strength,Hypomobility,Impaired perceived functional ability,Difficulty walking,Abnormal gait,Improper body mechanics,Impaired flexibility,Decreased balance  Visit Diagnosis: Chronic pain of left knee  Chronic pain of right knee  Difficulty in walking, not elsewhere classified  Muscle weakness (generalized)     Problem List Patient Active Problem List   Diagnosis Date Noted  . Low back pain 11/07/2020  . Dysuria 10/24/2020  . Pain, dental 09/29/2020  . Hypercholesterolemia 04/08/2020  . Left knee pain 04/08/2020  . COVID-19  virus infection 10/31/2019  . Stress 09/25/2019  . Joint stiffness of hand 06/26/2018  . Plantar fasciitis 06/26/2018  . Left leg pain 02/21/2018  . Hyperglycemia 02/21/2018  . Visual disturbance 02/20/2016  . Near syncope 02/20/2016  . Right lower quadrant pain 08/04/2015  . Abnormal liver function tests 08/04/2015  . UTI symptoms 03/31/2015  . Health care maintenance 02/03/2015  . Cough 12/21/2014  . History of colonic polyps 05/29/2014  . Leukopenia 07/07/2013  . Asthma 10/10/2012  . Allergic rhinitis 10/10/2012  . GERD (gastroesophageal reflux disease) 10/10/2012  . Hypertension 10/10/2012   Everlean Alstrom. Graylon Good, PT, DPT 04/17/21, 11:52 AM  [facility] [address], [place], [address] Phone: [phone]   Fax:  [phone]  Name: Chloe Moyer MRN: [id number] Date of Birth: 05/21/1951

## 2021-04-22 ENCOUNTER — Other Ambulatory Visit: Payer: Self-pay

## 2021-04-22 ENCOUNTER — Encounter: Payer: Self-pay | Admitting: Physical Therapy

## 2021-04-22 ENCOUNTER — Ambulatory Visit: Payer: PPO | Admitting: Physical Therapy

## 2021-04-22 DIAGNOSIS — M6281 Muscle weakness (generalized): Secondary | ICD-10-CM

## 2021-04-22 DIAGNOSIS — R262 Difficulty in walking, not elsewhere classified: Secondary | ICD-10-CM

## 2021-04-22 DIAGNOSIS — M25561 Pain in right knee: Secondary | ICD-10-CM

## 2021-04-22 DIAGNOSIS — G8929 Other chronic pain: Secondary | ICD-10-CM

## 2021-04-22 DIAGNOSIS — M25562 Pain in left knee: Secondary | ICD-10-CM

## 2021-04-22 NOTE — Therapy (Signed)
[facility] [address], [place], [address] Phone: [phone]   Fax:  [phone]  Physical Therapy Treatment  Patient Details  Name: Chloe Moyer MRN: [id number] Date of Birth: [birth year] Referring Provider (PT): Einar Pheasant, MD   Encounter Date: 04/22/2021   PT End of Session - 04/22/21 0923    Visit Number 7    Number of Visits 24    Date for PT Re-Evaluation 06/25/21    Authorization Type [employer] reporting period from 04/02/2021    Progress Note Due on Visit 10    PT Start Time 0900    PT Stop Time 0940    PT Time Calculation (min) 40 min    Activity Tolerance Patient tolerated treatment well    Behavior During Therapy [facility] for tasks assessed/performed           Past Medical History:  Diagnosis Date  . Allergic rhinitis   . Arthritis   . Asthma   . Diverticulosis, sigmoid   . GERD (gastroesophageal reflux disease)   . History of hiatal hernia   . Hypertension 06-12-2005  . Ulcer     Past Surgical History:  Procedure Laterality Date  . BREAST BIOPSY Right 09/01/2017   Affirm Bx of 2 areas- both areas were fibroadenomatoid change   . BREAST CYST ASPIRATION Left 1990  . CARDIAC CATHETERIZATION  July 2012  . CHOLECYSTECTOMY  1991  . LIPOMA EXCISION  4/99   Left flank area  . NASAL SINUS SURGERY  July 2012  . TUBAL LIGATION  1984    There were no vitals filed for this visit.   Subjective Assessment - 04/22/21 0918    Subjective Patient reports B knee pain 2/10 upon arrival and R sided low back pain 1/10. She pulled a lot of weeds this weekend but had a pretty quiet weekend. States she had some trouble sleeping the night after last PT sessoin due to R lateral thigh and knee pain. Thought maybe it was related to hip hikes.    Pertinent History Patient is a 70 y.o. female who presents to outpatient physical therapy with a referral for medical diagnosispain in both knees,  unspecified chronicity. This patient's chief complaints chronic bilateral knee pain, L > R, leading to the following functional deficits:  Difficulty walking, running, playing on beach with grandson, athletic activities, stairs, sleeping, lower extremity dressing, weight bearing activities, jumping, etc, and decreased quality of life.  Relevant past medical history and comorbidities include low back pain with referral to right thigh, chronic allergic asthma (epi pen in car), acute sinusitis (two surgeries), skin spot removed, hypertension, dental issues, reflux, hx of cardiac catheterization to prove she doesn't have CHF (heart was healthy), cholecystectomy, osteoarthritis, hx L knee pain (baker's cyst/meniscus). Patient denies hx of cancer, stroke, seizures, lung problem (besides asthma), major cardiac events, diabetes, unexplained weight loss, changes in bowel or bladder problems, new onset stumbling or dropping things, low back surgeryor bladder problems, new onset stumbling or dropping things, low back surgery, osteoporosis.    Limitations Lifting;Walking;House hold activities;Other (comment);Standing    Diagnostic tests L MRI report 11/14/2018: "IMPRESSION:  1. Radial tear of the medial meniscus posterior horn.  2. Subchondral insufficiency fracture of the medial tibial plateau.  3. Mild tricompartmental osteoarthritis.  4. Small joint effusion and Baker cyst."    Patient Stated Goals "I just need to strengthen both knees"    Currently in Pain? Yes    Pain  Score 2     Pain Onset More than a month ago           TREATMENT:  Manual therapy: to reduce pain and tissue tension, improve range of motion, neuromodulation, in order to promote improved ability to complete functional activities. PRONE - STM to right lumbar paraspinals.  Modality: (unbilled) Dry needling performed to right lumbar region to decrease pain and spasms along patient's right low back region with patient in prone utilizing (1)  dry needle(s) .95mm x 10mm with (4) sticks at R lumbar multifidi ~ L4 and L5 and R Iliocostalis at approximately L4 . Patient educated about the risks and benefits from therapy and verbally consents to treatment. Dry needling performed by Everlean Alstrom. Graylon Good PT, DPT who is certified in this technique.  Therapeutic exercise:to centralize symptoms and improve ROM, strength, muscular endurance, and activity tolerance required for successful completion of functional activities. - prone press up1x10, 1x10,(lock and sag for last few reps first setwith improved lumbar extension)(challenging due to arm fatigue). - standing hip hike off 2x4 board, 3x10 each side (more difficult with R side supporting), U UE support for balance. - Total gym squats, slow tempo, level 24, 2x10-12  - stand lumbar extension over plinth, 1x20 - standing RDL, 2x10, 10/20# KB elevated on 4 inch yoga block.  - stand lumbar extension over plinth, 1x10  Pt required multimodal cuing for proper technique and to facilitate improved neuromuscular control, strength, range of motion, and functional ability resulting in improved performance and form.   HOME EXERCISE PROGRAM Access Code: [email or web address] URL: [email or web address] Date: 04/17/2021 Prepared by: Rosita Kea  Exercises Bridge with Hip Abduction and Resistance - Ground Touches - 1 sets - 20 reps - 3 seconds hold Wall Sit - 1 x daily - 1 sets - 5 reps - 15 seconds hold Sitting Knee Extension with Resistance - 1 x daily - 1-3 sets - 10 reps - 3 seconds hold Hip Hikes off step - 1 x daily - 2-3 sets - 10 reps    PT Education - 04/22/21 0923    Education Details Exercise purpose/form. Self management techniques    Person(s) Educated Patient    Methods Explanation;Demonstration;Tactile cues;Verbal cues    Comprehension Verbalized understanding;Returned demonstration;Verbal cues required;Tactile cues required;Need further instruction            PT Short  Term Goals - 04/02/21 1735      PT SHORT TERM GOAL #1   Title Be independent with initial home exercise program for self-management of symptoms.    Baseline Initial HEP provided at IE (04/02/2021);    Time 2    Period Weeks    Status Achieved    Target Date 04/16/21             PT Long Term Goals - 04/02/21 1736      PT LONG TERM GOAL #1   Title Be independent with a long-term home exercise program for self-management of symptoms.    Baseline Initial HEP provided at IE (04/02/2021);    Time 12    Period Weeks    Status New   TARGET DATE FOR ALL LONG TERM GOALS: 06/25/2021     PT LONG TERM GOAL #2   Title Demonstrate improved FOTO score to equal or greater than 63 by visit #12 to demonstrate improvement in overall condition and self-reported functional ability.    Baseline 59 (04/02/2021);    Time 12    Period Weeks    Status New  PT LONG TERM GOAL #3   Title Improve B hip and knee strength to 4+/5 with no increase in pain to improve patient's ability to complete  valued functional tasks such hiking, stairs, running with less difficulty.    Baseline painful and weak - see objective exam (04/02/2021);    Time 12    Period Weeks    Status New      PT LONG TERM GOAL #4   Title Reduce pain with functional activities to equal or less than 1/10 to allow patient to complete usual activities including ADLs, IADLs, and social engagement with less difficulty.    Baseline 8/10 (04/02/2021);    Time 12    Period Weeks    Status New      PT LONG TERM GOAL #5   Title Complete community, work and/or recreational activities without limitation due to current condition.    Baseline Functional Limitations: walking, running, playing on beach with grandson, athletic activities, stairs, sleeping, lower extremity dressing, weight bearing activities, jumping (04/02/2021);    Time 12    Period Weeks    Status New                 Plan - 04/22/21 0926    Clinical Impression Statement  Patient tolerated treatment well overall and continued working B hip/knee strength as tolerated. Continued to report some mild knee irritation with hip hikes but wanted to continue. Low back again addressed to allow patient to continue to engage in exercises supporting improvement in knee pain. Patient would benefit from continued management of limiting condition by skilled physical therapist to address remaining impairments and functional limitations to work towards stated goals and return to PLOF or maximal functional independence.    Personal Factors and Comorbidities Age;Comorbidity 3+;Time since onset of injury/illness/exacerbation;Past/Current Experience    Comorbidities Relevant past medical history and comorbidities include low back pain with referral to right thigh, chronic allergic asthma (epi pen in car), acute sinusitis (two surgeries), skin spot removed, hypertension, dental issues, reflux, hx of cardiac catheterization to prove she doesn't have CHF (heart was healthy), cholecystectomy, osteoarthritis, hx L knee pain (baker's cyst/meniscus).    Examination-Activity Limitations Locomotion Level;Transfers;Stand;Carry;Squat;Stairs;Dressing;Sleep;Lift    Examination-Participation Restrictions Community Activity;Interpersonal Relationship;Laundry;Shop;Yard Work   Difficulty walking, running, playing on beach with grandson, athletic activities, stairs, sleeping, lower extremity dressing, weight bearing activities, jumping, etc, and decreased quality of life.   Stability/Clinical Decision Making Evolving/Moderate complexity    Rehab Potential Good    PT Frequency 2x / week    PT Duration 12 weeks    PT Treatment/Interventions ADLs/Self Care Home Management;Aquatic Therapy;Cryotherapy;Moist Heat;Electrical Stimulation;Therapeutic activities;Gait training;Therapeutic exercise;Neuromuscular re-education;Patient/family education;Passive range of motion;Dry needling;Spinal Manipulations;Joint  Manipulations;Manual techniques    PT Next Visit Plan update HEP as appropriate, B LE and functional strengthening/balance as tolerated    PT Home Exercise Plan Medbridge  Access Code: [email or web address]    Consulted and Agree with Plan of Care Patient           Patient will benefit from skilled therapeutic intervention in order to improve the following deficits and impairments:  Pain,Decreased coordination,Decreased mobility,Increased muscle spasms,Postural dysfunction,Decreased activity tolerance,Decreased endurance,Decreased range of motion,Decreased strength,Hypomobility,Impaired perceived functional ability,Difficulty walking,Abnormal gait,Improper body mechanics,Impaired flexibility,Decreased balance  Visit Diagnosis: Chronic pain of left knee  Chronic pain of right knee  Difficulty in walking, not elsewhere classified  Muscle weakness (generalized)     Problem List Patient Active Problem List   Diagnosis Date Noted  . Low back pain 11/07/2020  .  Dysuria 10/24/2020  . Pain, dental 09/29/2020  . Hypercholesterolemia 04/08/2020  . Left knee pain 04/08/2020  . COVID-19 virus infection 10/31/2019  . Stress 09/25/2019  . Joint stiffness of hand 06/26/2018  . Plantar fasciitis 06/26/2018  . Left leg pain 02/21/2018  . Hyperglycemia 02/21/2018  . Visual disturbance 02/20/2016  . Near syncope 02/20/2016  . Right lower quadrant pain 08/04/2015  . Abnormal liver function tests 08/04/2015  . UTI symptoms 03/31/2015  . Health care maintenance 02/03/2015  . Cough 12/21/2014  . History of colonic polyps 05/29/2014  . Leukopenia 07/07/2013  . Asthma 10/10/2012  . Allergic rhinitis 10/10/2012  . GERD (gastroesophageal reflux disease) 10/10/2012  . Hypertension 10/10/2012    Everlean Alstrom. Graylon Good, PT, DPT 04/22/21, 9:43 AM  [facility] [address], [place], [address] Phone: [phone]   Fax:  [phone]  Name:  JAKALA HERFORD MRN: [id number] Date of Birth: 1951/01/27

## 2021-04-25 ENCOUNTER — Ambulatory Visit: Payer: PPO | Admitting: Physical Therapy

## 2021-04-25 ENCOUNTER — Other Ambulatory Visit: Payer: Self-pay

## 2021-04-25 DIAGNOSIS — R262 Difficulty in walking, not elsewhere classified: Secondary | ICD-10-CM

## 2021-04-25 DIAGNOSIS — M25562 Pain in left knee: Secondary | ICD-10-CM | POA: Diagnosis not present

## 2021-04-25 DIAGNOSIS — M545 Low back pain, unspecified: Secondary | ICD-10-CM

## 2021-04-25 DIAGNOSIS — M6281 Muscle weakness (generalized): Secondary | ICD-10-CM

## 2021-04-25 DIAGNOSIS — G8929 Other chronic pain: Secondary | ICD-10-CM

## 2021-04-25 NOTE — Therapy (Signed)
[facility] [address] [place], [address] Phone: [phone]   Fax:  [phone]  Physical Therapy Treatment  Patient Details  Name: Chloe Moyer MRN: [id number] Date of Birth: February 06, 1951 Referring Provider (PT): Einar Pheasant, MD   Encounter Date: 04/25/2021   PT End of Session - 04/25/21 1126     Visit Number 8    Number of Visits 24    Date for PT Re-Evaluation 06/25/21    Authorization Type [employer] reporting period from 04/02/2021    Progress Note Due on Visit 10    PT Start Time 1120    PT Stop Time 1200    PT Time Calculation (min) 40 min    Activity Tolerance Patient tolerated treatment well    Behavior During Therapy [facility] for tasks assessed/performed             Past Medical History:  Diagnosis Date   Allergic rhinitis    Arthritis    Asthma    Diverticulosis, sigmoid    GERD (gastroesophageal reflux disease)    History of hiatal hernia    Hypertension 06-12-2005   Ulcer     Past Surgical History:  Procedure Laterality Date   BREAST BIOPSY Right 09/01/2017   Affirm Bx of 2 areas- both areas were fibroadenomatoid change    BREAST CYST ASPIRATION Left 1990   CARDIAC CATHETERIZATION  July 2012   CHOLECYSTECTOMY  1991   LIPOMA EXCISION  4/99   Left flank area   NASAL SINUS SURGERY  July 2012   TUBAL LIGATION  1984    There were no vitals filed for this visit.   Subjective Assessment - 04/25/21 1123     Subjective Patient reports she is feeling pretty good today. She has 2/10 pain in the right low back and 1/10 in B anterior knees. HEP is going well. She was a bit sore after last treatment session but feels that is okay. States she can tell she is getting stronger and she can do more steps normally (1-2). Will be pretty low key this weekend but may make a trip to the beach.    Pertinent History Patient is a 70 y.o. female who presents to outpatient physical  therapy with a referral for medical diagnosispain in both knees, unspecified chronicity. This patient's chief complaints chronic bilateral knee pain, L > R, leading to the following functional deficits:  Difficulty walking, running, playing on beach with grandson, athletic activities, stairs, sleeping, lower extremity dressing, weight bearing activities, jumping, etc, and decreased quality of life.  Relevant past medical history and comorbidities include low back pain with referral to right thigh, chronic allergic asthma (epi pen in car), acute sinusitis (two surgeries), skin spot removed, hypertension, dental issues, reflux, hx of cardiac catheterization to prove she doesn't have CHF (heart was healthy), cholecystectomy, osteoarthritis, hx L knee pain (baker's cyst/meniscus). Patient denies hx of cancer, stroke, seizures, lung problem (besides asthma), major cardiac events, diabetes, unexplained weight loss, changes in bowel or bladder problems, new onset stumbling or dropping things, low back surgeryor bladder problems, new onset stumbling or dropping things, low back surgery, osteoporosis.    Limitations Lifting;Walking;House hold activities;Other (comment);Standing    Diagnostic tests L MRI report 11/14/2018: "IMPRESSION:  1. Radial tear of the medial meniscus posterior horn.  2. Subchondral insufficiency fracture of the medial tibial plateau.  3. Mild tricompartmental osteoarthritis.  4. Small joint effusion and Baker cyst."    Patient Stated  Goals "I just need to strengthen both knees"    Currently in Pain? Yes    Pain Score 2     Pain Onset More than a month ago             OBJECTIVE FOTO = 65 (04/25/2021)  TREATMENT:    Manual therapy: to reduce pain and tissue tension, improve range of motion, neuromodulation, in order to promote improved ability to complete functional activities. PRONE - STM to right lumbar paraspinals.   Modality: (unbilled) Dry needling performed to right lumbar  region to decrease pain and spasms along patient's right low back region with patient in prone utilizing (1) dry needle(s) .60mm x 38mm with (3) sticks at R lumbar multifidi ~ L4 and L5 and R Iliocostalis at approximately L4 . Patient educated about the risks and benefits from therapy and verbally consents to treatment.  Dry needling performed by Everlean Alstrom. Graylon Good PT, DPT who is certified in this technique.   Therapeutic exercise: to centralize symptoms and improve ROM, strength, muscular endurance, and activity tolerance required for successful completion of functional activities.  - prone press up 2x10 - Total gym squats, slow tempo, level 24, 1x20 - standing hip hike off 2x4 board, 3x10 each side (more difficult with R side supporting), U UE support for balance. - standing RDL, 1x20, 20# KB elevated on 4 inch yoga block. - standing B ankle dorsiflexion leaning hips/back against wall, 2x10 - stand lumbar extension over plinth, 1x20   Pt required multimodal cuing for proper technique and to facilitate improved neuromuscular control, strength, range of motion, and functional ability resulting in improved performance and form.     HOME EXERCISE PROGRAM Access Code: [email or web address] URL: [email or web address] Date: 04/17/2021 Prepared by: Rosita Kea   Exercises Bridge with Hip Abduction and Resistance - Ground Touches - 1 sets - 20 reps - 3 seconds hold Wall Sit - 1 x daily - 1 sets - 5 reps - 15 seconds hold Sitting Knee Extension with Resistance - 1 x daily - 1-3 sets - 10 reps - 3 seconds hold Hip Hikes off step - 1 x daily - 2-3 sets - 10 reps    PT Education - 04/25/21 1126     Education Details Exercise purpose/form. Self management techniques    Person(s) Educated Patient    Methods Explanation;Demonstration;Tactile cues;Verbal cues    Comprehension Verbalized understanding;Returned demonstration;Verbal cues required;Tactile cues required;Need further instruction               PT Short Term Goals - 04/02/21 1735       PT SHORT TERM GOAL #1   Title Be independent with initial home exercise program for self-management of symptoms.    Baseline Initial HEP provided at IE (04/02/2021);    Time 2    Period Weeks    Status Achieved    Target Date 04/16/21               PT Long Term Goals - 04/02/21 1736       PT LONG TERM GOAL #1   Title Be independent with a long-term home exercise program for self-management of symptoms.    Baseline Initial HEP provided at IE (04/02/2021);    Time 12    Period Weeks    Status New   TARGET DATE FOR ALL LONG TERM GOALS: 06/25/2021     PT LONG TERM GOAL #2   Title Demonstrate improved FOTO score to equal or greater than 63 by visit #12  to demonstrate improvement in overall condition and self-reported functional ability.    Baseline 59 (04/02/2021);    Time 12    Period Weeks    Status New      PT LONG TERM GOAL #3   Title Improve B hip and knee strength to 4+/5 with no increase in pain to improve patient's ability to complete  valued functional tasks such hiking, stairs, running with less difficulty.    Baseline painful and weak - see objective exam (04/02/2021);    Time 12    Period Weeks    Status New      PT LONG TERM GOAL #4   Title Reduce pain with functional activities to equal or less than 1/10 to allow patient to complete usual activities including ADLs, IADLs, and social engagement with less difficulty.    Baseline 8/10 (04/02/2021);    Time 12    Period Weeks    Status New      PT LONG TERM GOAL #5   Title Complete community, work and/or recreational activities without limitation due to current condition.    Baseline Functional Limitations: walking, running, playing on beach with grandson, athletic activities, stairs, sleeping, lower extremity dressing, weight bearing activities, jumping (04/02/2021);    Time 12    Period Weeks    Status New                   Plan - 04/25/21 1159      Clinical Impression Statement Patient tolerated treatment well overall and continues to work on strengthening B LE. Low back addressed due to continued pain there that inhibits her ability to perform exercises for knee pain. Patient would benefit from continued management of limiting condition by skilled physical therapist to address remaining impairments and functional limitations to work towards stated goals and return to PLOF or maximal functional independence.    Personal Factors and Comorbidities Age;Comorbidity 3+;Time since onset of injury/illness/exacerbation;Past/Current Experience    Comorbidities Relevant past medical history and comorbidities include low back pain with referral to right thigh, chronic allergic asthma (epi pen in car), acute sinusitis (two surgeries), skin spot removed, hypertension, dental issues, reflux, hx of cardiac catheterization to prove she doesn't have CHF (heart was healthy), cholecystectomy, osteoarthritis, hx L knee pain (baker's cyst/meniscus).    Examination-Activity Limitations Locomotion Level;Transfers;Stand;Carry;Squat;Stairs;Dressing;Sleep;Lift    Examination-Participation Restrictions Community Activity;Interpersonal Relationship;Laundry;Shop;Yard Work   Difficulty walking, running, playing on beach with grandson, athletic activities, stairs, sleeping, lower extremity dressing, weight bearing activities, jumping, etc, and decreased quality of life.   Stability/Clinical Decision Making Evolving/Moderate complexity    Rehab Potential Good    PT Frequency 2x / week    PT Duration 12 weeks    PT Treatment/Interventions ADLs/Self Care Home Management;Aquatic Therapy;Cryotherapy;Moist Heat;Electrical Stimulation;Therapeutic activities;Gait training;Therapeutic exercise;Neuromuscular re-education;Patient/family education;Passive range of motion;Dry needling;Spinal Manipulations;Joint Manipulations;Manual techniques    PT Next Visit Plan update HEP as appropriate, B  LE and functional strengthening/balance as tolerated    PT Home Exercise Plan Medbridge  Access Code: [email or web address]    Consulted and Agree with Plan of Care Patient             Patient will benefit from skilled therapeutic intervention in order to improve the following deficits and impairments:  Pain, Decreased coordination, Decreased mobility, Increased muscle spasms, Postural dysfunction, Decreased activity tolerance, Decreased endurance, Decreased range of motion, Decreased strength, Hypomobility, Impaired perceived functional ability, Difficulty walking, Abnormal gait, Improper body mechanics, Impaired flexibility, Decreased balance  Visit Diagnosis: Chronic  pain of left knee  Chronic pain of right knee  Difficulty in walking, not elsewhere classified  Muscle weakness (generalized)  Chronic right-sided low back pain, unspecified whether sciatica present     Problem List Patient Active Problem List   Diagnosis Date Noted   Low back pain 11/07/2020   Dysuria 10/24/2020   Pain, dental 09/29/2020   Hypercholesterolemia 04/08/2020   Left knee pain 04/08/2020   COVID-19 virus infection 10/31/2019   Stress 09/25/2019   Joint stiffness of hand 06/26/2018   Plantar fasciitis 06/26/2018   Left leg pain 02/21/2018   Hyperglycemia 02/21/2018   Visual disturbance 02/20/2016   Near syncope 02/20/2016   Right lower quadrant pain 08/04/2015   Abnormal liver function tests 08/04/2015   UTI symptoms 03/31/2015   Health care maintenance 02/03/2015   Cough 12/21/2014   History of colonic polyps 05/29/2014   Leukopenia 07/07/2013   Asthma 10/10/2012   Allergic rhinitis 10/10/2012   GERD (gastroesophageal reflux disease) 10/10/2012   Hypertension 10/10/2012   Everlean Alstrom. Graylon Good, PT, DPT 04/25/21, 12:02 PM   [place] [facility] [address], [place], [address] Phone: [phone]   Fax:  [phone]  Name: Chloe Moyer MRN: [id number] Date of Birth: January 09, 1951

## 2021-04-29 ENCOUNTER — Other Ambulatory Visit: Payer: Self-pay | Admitting: Internal Medicine

## 2021-04-30 ENCOUNTER — Ambulatory Visit: Payer: PPO | Admitting: Physical Therapy

## 2021-04-30 ENCOUNTER — Encounter: Payer: Self-pay | Admitting: Physical Therapy

## 2021-04-30 ENCOUNTER — Other Ambulatory Visit: Payer: Self-pay

## 2021-04-30 DIAGNOSIS — M6281 Muscle weakness (generalized): Secondary | ICD-10-CM

## 2021-04-30 DIAGNOSIS — R262 Difficulty in walking, not elsewhere classified: Secondary | ICD-10-CM

## 2021-04-30 DIAGNOSIS — G8929 Other chronic pain: Secondary | ICD-10-CM

## 2021-04-30 DIAGNOSIS — M25562 Pain in left knee: Secondary | ICD-10-CM | POA: Diagnosis not present

## 2021-04-30 NOTE — Therapy (Signed)
[facility] [address], [place], [address] Phone: [phone]   Fax:  [phone]  Physical Therapy Treatment  Patient Details  Name: Chloe Moyer MRN: [id number] Date of Birth: 07-02-1951 Referring Provider (PT): Einar Pheasant, MD   Encounter Date: 04/30/2021   PT End of Session - 04/30/21 1511     Visit Number 9    Number of Visits 24    Date for PT Re-Evaluation 06/25/21    Authorization Type [employer] reporting period from 04/02/2021    Progress Note Due on Visit 10    PT Start Time 0950    PT Stop Time 1030    PT Time Calculation (min) 40 min    Activity Tolerance Patient tolerated treatment well    Behavior During Therapy [facility] for tasks assessed/performed             Past Medical History:  Diagnosis Date   Allergic rhinitis    Arthritis    Asthma    Diverticulosis, sigmoid    GERD (gastroesophageal reflux disease)    History of hiatal hernia    Hypertension 06-12-2005   Ulcer     Past Surgical History:  Procedure Laterality Date   BREAST BIOPSY Right 09/01/2017   Affirm Bx of 2 areas- both areas were fibroadenomatoid change    BREAST CYST ASPIRATION Left 1990   CARDIAC CATHETERIZATION  July 2012   CHOLECYSTECTOMY  1991   LIPOMA EXCISION  4/99   Left flank area   NASAL SINUS SURGERY  July 2012   TUBAL LIGATION  1984    There were no vitals filed for this visit.   Subjective Assessment - 04/30/21 0951     Subjective Patient reports she is feeling pretty good today but does have some pain in the left medial knee (2/10). Felt okay after last PT session. Will be leaving for the beach tomorrow. Back was bothering her a bit yesterday and husband rubbed it. Does feel okay today but would like to complete needling so she doesn't have as much trouble while she is away until next week.    Pertinent History Patient is a 70 y.o. female who presents to outpatient physical  therapy with a referral for medical diagnosispain in both knees, unspecified chronicity. This patient's chief complaints chronic bilateral knee pain, L > R, leading to the following functional deficits:  Difficulty walking, running, playing on beach with grandson, athletic activities, stairs, sleeping, lower extremity dressing, weight bearing activities, jumping, etc, and decreased quality of life.  Relevant past medical history and comorbidities include low back pain with referral to right thigh, chronic allergic asthma (epi pen in car), acute sinusitis (two surgeries), skin spot removed, hypertension, dental issues, reflux, hx of cardiac catheterization to prove she doesn't have CHF (heart was healthy), cholecystectomy, osteoarthritis, hx L knee pain (baker's cyst/meniscus). Patient denies hx of cancer, stroke, seizures, lung problem (besides asthma), major cardiac events, diabetes, unexplained weight loss, changes in bowel or bladder problems, new onset stumbling or dropping things, low back surgeryor bladder problems, new onset stumbling or dropping things, low back surgery, osteoporosis.    Limitations Lifting;Walking;House hold activities;Other (comment);Standing    Diagnostic tests L MRI report 11/14/2018: "IMPRESSION:  1. Radial tear of the medial meniscus posterior horn.  2. Subchondral insufficiency fracture of the medial tibial plateau.  3. Mild tricompartmental osteoarthritis.  4. Small joint effusion and Baker cyst."    Patient Stated Goals "I just need to  strengthen both knees"    Currently in Pain? Yes    Pain Score 3     Pain Onset More than a month ago             OBJECTIVE FOTO = 65 (04/25/2021)   TREATMENT:    Manual therapy: to reduce pain and tissue tension, improve range of motion, neuromodulation, in order to promote improved ability to complete functional activities. PRONE - STM to right lumbar paraspinals.   Modality: (unbilled) Dry needling performed to right lumbar  region to decrease pain and spasms along patient's right low back region with patient in prone utilizing (1) dry needle(s) .106mm x 36mm with (3) sticks at R lumbar multifidi ~ L4 and L5 and R Iliocostalis at approximately L4 . Patient educated about the risks and benefits from therapy and verbally consents to treatment.  Dry needling performed by Everlean Alstrom. Graylon Good PT, DPT who is certified in this technique.   Therapeutic exercise: to centralize symptoms and improve ROM, strength, muscular endurance, and activity tolerance required for successful completion of functional activities.  - prone press up 1x16, 1x4 - Total gym squats, slow tempo, level 24, 1x30 - standing lumbar extension over plinth, 1x10 - standing hip hike off 2x4 board, 3x10 each side (more difficult with R side supporting), U UE support for balance. - standing B ankle dorsiflexion leaning hips/back against wall, 3x10 - modified running man with UE support, ~ 30-40 reps each side (regressed, difficult) - standing lumbar extension over plinth, 1x10   Pt required multimodal cuing for proper technique and to facilitate improved neuromuscular control, strength, range of motion, and functional ability resulting in improved performance and form.     HOME EXERCISE PROGRAM Access Code: [email or web address] URL: [email or web address] Date: 04/17/2021 Prepared by: Rosita Kea   Exercises Bridge with Hip Abduction and Resistance - Ground Touches - 1 sets - 20 reps - 3 seconds hold Wall Sit - 1 x daily - 1 sets - 5 reps - 15 seconds hold Sitting Knee Extension with Resistance - 1 x daily - 1-3 sets - 10 reps - 3 seconds hold Hip Hikes off step - 1 x daily - 2-3 sets - 10 reps     PT Education - 04/30/21 1511     Education Details Exercise purpose/form. Self management techniques    Person(s) Educated Patient    Methods Explanation;Demonstration;Tactile cues;Verbal cues    Comprehension Verbalized understanding;Returned  demonstration;Verbal cues required;Tactile cues required;Need further instruction              PT Short Term Goals - 04/02/21 1735       PT SHORT TERM GOAL #1   Title Be independent with initial home exercise program for self-management of symptoms.    Baseline Initial HEP provided at IE (04/02/2021);    Time 2    Period Weeks    Status Achieved    Target Date 04/16/21               PT Long Term Goals - 04/02/21 1736       PT LONG TERM GOAL #1   Title Be independent with a long-term home exercise program for self-management of symptoms.    Baseline Initial HEP provided at IE (04/02/2021);    Time 12    Period Weeks    Status New   TARGET DATE FOR ALL LONG TERM GOALS: 06/25/2021     PT LONG TERM GOAL #2   Title Demonstrate improved FOTO score to equal  or greater than 63 by visit #12 to demonstrate improvement in overall condition and self-reported functional ability.    Baseline 59 (04/02/2021);    Time 12    Period Weeks    Status New      PT LONG TERM GOAL #3   Title Improve B hip and knee strength to 4+/5 with no increase in pain to improve patient's ability to complete  valued functional tasks such hiking, stairs, running with less difficulty.    Baseline painful and weak - see objective exam (04/02/2021);    Time 12    Period Weeks    Status New      PT LONG TERM GOAL #4   Title Reduce pain with functional activities to equal or less than 1/10 to allow patient to complete usual activities including ADLs, IADLs, and social engagement with less difficulty.    Baseline 8/10 (04/02/2021);    Time 12    Period Weeks    Status New      PT LONG TERM GOAL #5   Title Complete community, work and/or recreational activities without limitation due to current condition.    Baseline Functional Limitations: walking, running, playing on beach with grandson, athletic activities, stairs, sleeping, lower extremity dressing, weight bearing activities, jumping (04/02/2021);    Time  12    Period Weeks    Status New                   Plan - 04/30/21 1513     Clinical Impression Statement Patient tolerated treatment well and continues to demonstrate improving tolerance for loading at the knee. Used dry needling to address back pain that inhibits patient from completing knee exercises. Patient demonstrated poor motor control and knee stability with running man exercise and would benefit from further work in this area. Patient would benefit from continued management of limiting condition by skilled physical therapist to address remaining impairments and functional limitations to work towards stated goals and return to PLOF or maximal functional independence.    Personal Factors and Comorbidities Age;Comorbidity 3+;Time since onset of injury/illness/exacerbation;Past/Current Experience    Comorbidities Relevant past medical history and comorbidities include low back pain with referral to right thigh, chronic allergic asthma (epi pen in car), acute sinusitis (two surgeries), skin spot removed, hypertension, dental issues, reflux, hx of cardiac catheterization to prove she doesn't have CHF (heart was healthy), cholecystectomy, osteoarthritis, hx L knee pain (baker's cyst/meniscus).    Examination-Activity Limitations Locomotion Level;Transfers;Stand;Carry;Squat;Stairs;Dressing;Sleep;Lift    Examination-Participation Restrictions Community Activity;Interpersonal Relationship;Laundry;Shop;Yard Work   Difficulty walking, running, playing on beach with grandson, athletic activities, stairs, sleeping, lower extremity dressing, weight bearing activities, jumping, etc, and decreased quality of life.   Stability/Clinical Decision Making Evolving/Moderate complexity    Rehab Potential Good    PT Frequency 2x / week    PT Duration 12 weeks    PT Treatment/Interventions ADLs/Self Care Home Management;Aquatic Therapy;Cryotherapy;Moist Heat;Electrical Stimulation;Therapeutic  activities;Gait training;Therapeutic exercise;Neuromuscular re-education;Patient/family education;Passive range of motion;Dry needling;Spinal Manipulations;Joint Manipulations;Manual techniques    PT Next Visit Plan update HEP as appropriate, B LE and functional strengthening/balance as tolerated    PT Home Exercise Plan Medbridge  Access Code: [email or web address]    Consulted and Agree with Plan of Care Patient             Patient will benefit from skilled therapeutic intervention in order to improve the following deficits and impairments:  Pain, Decreased coordination, Decreased mobility, Increased muscle spasms, Postural dysfunction, Decreased activity tolerance, Decreased endurance,  Decreased range of motion, Decreased strength, Hypomobility, Impaired perceived functional ability, Difficulty walking, Abnormal gait, Improper body mechanics, Impaired flexibility, Decreased balance  Visit Diagnosis: Chronic pain of left knee  Chronic pain of right knee  Difficulty in walking, not elsewhere classified  Muscle weakness (generalized)     Problem List Patient Active Problem List   Diagnosis Date Noted   Low back pain 11/07/2020   Dysuria 10/24/2020   Pain, dental 09/29/2020   Hypercholesterolemia 04/08/2020   Left knee pain 04/08/2020   COVID-19 virus infection 10/31/2019   Stress 09/25/2019   Joint stiffness of hand 06/26/2018   Plantar fasciitis 06/26/2018   Left leg pain 02/21/2018   Hyperglycemia 02/21/2018   Visual disturbance 02/20/2016   Near syncope 02/20/2016   Right lower quadrant pain 08/04/2015   Abnormal liver function tests 08/04/2015   UTI symptoms 03/31/2015   Health care maintenance 02/03/2015   Cough 12/21/2014   History of colonic polyps 05/29/2014   Leukopenia 07/07/2013   Asthma 10/10/2012   Allergic rhinitis 10/10/2012   GERD (gastroesophageal reflux disease) 10/10/2012   Hypertension 10/10/2012    Everlean Alstrom. Graylon Good, PT, DPT 04/30/21, 3:14 PM   [facility] [address] [place], [address] Phone: [phone]   Fax:  [phone]  Name: Chloe Moyer MRN: [id number] Date of Birth: 01/31/1951

## 2021-05-02 ENCOUNTER — Encounter: Payer: PPO | Admitting: Physical Therapy

## 2021-05-06 DIAGNOSIS — L578 Other skin changes due to chronic exposure to nonionizing radiation: Secondary | ICD-10-CM | POA: Diagnosis not present

## 2021-05-06 DIAGNOSIS — L853 Xerosis cutis: Secondary | ICD-10-CM | POA: Diagnosis not present

## 2021-05-06 DIAGNOSIS — L570 Actinic keratosis: Secondary | ICD-10-CM | POA: Diagnosis not present

## 2021-05-06 DIAGNOSIS — Z872 Personal history of diseases of the skin and subcutaneous tissue: Secondary | ICD-10-CM | POA: Diagnosis not present

## 2021-05-07 ENCOUNTER — Ambulatory Visit: Payer: PPO | Admitting: Physical Therapy

## 2021-05-07 DIAGNOSIS — M25562 Pain in left knee: Secondary | ICD-10-CM

## 2021-05-07 DIAGNOSIS — M6281 Muscle weakness (generalized): Secondary | ICD-10-CM

## 2021-05-07 DIAGNOSIS — R262 Difficulty in walking, not elsewhere classified: Secondary | ICD-10-CM

## 2021-05-07 DIAGNOSIS — G8929 Other chronic pain: Secondary | ICD-10-CM

## 2021-05-07 NOTE — Therapy (Signed)
[facility] [address] [place], [address] Phone: [phone]   Fax:  [phone]  Physical Therapy Treatment / Progress Note Dates of reporting: 04/02/2021 - 05/07/2021  Patient Details  Name: Chloe Moyer MRN: [id number] Date of Birth: September 08, 1951 Referring Provider (PT): Einar Pheasant, MD   Encounter Date: 05/07/2021   PT End of Session - 05/07/21 1005     Visit Number 10    Number of Visits 24    Date for PT Re-Evaluation 06/25/21    Authorization Type [employer] reporting period from 05/07/2021    Progress Note Due on Visit 10    PT Start Time 0904    PT Stop Time 0944    PT Time Calculation (min) 40 min    Activity Tolerance Patient tolerated treatment well    Behavior During Therapy [facility] for tasks assessed/performed             Past Medical History:  Diagnosis Date   Allergic rhinitis    Arthritis    Asthma    Diverticulosis, sigmoid    GERD (gastroesophageal reflux disease)    History of hiatal hernia    Hypertension 06-12-2005   Ulcer     Past Surgical History:  Procedure Laterality Date   BREAST BIOPSY Right 09/01/2017   Affirm Bx of 2 areas- both areas were fibroadenomatoid change    BREAST CYST ASPIRATION Left 1990   CARDIAC CATHETERIZATION  July 2012   CHOLECYSTECTOMY  1991   LIPOMA EXCISION  4/99   Left flank area   NASAL SINUS SURGERY  July 2012   TUBAL LIGATION  1984    There were no vitals filed for this visit.   Subjective Assessment - 05/07/21 0906     Subjective Patient reports her knees and back have been bothering her and limiting her a lot over the weekend as she did a lot of work helping to move everything needed out of her daughter's beach house furnature after it sold. They have one more trip. States the pain is in the medial portion of both knees and yesterday the right knee was catching a bit (which is unsual). Left is usually worse. Did a lot  of stairs, picking up things, lifting, walking on the beach. States her current pain is 2/10 in B knees and low back. States she felt she could not have made it to PT last night if she had an appointment scheduled (she did not). States although her knees are currently bothering her she is able to do more than she would have imagined being able to do prior to starting physical therapy and can feel her muscles getting stronger. Did not keep up with HEP this weekend because she used all of her resources on the trip this weekend. Highest level of pain in the last 2 weeks wa 4/10.    Pertinent History Patient is a 70 y.o. female who presents to outpatient physical therapy with a referral for medical diagnosispain in both knees, unspecified chronicity. This patient's chief complaints chronic bilateral knee pain, L > R, leading to the following functional deficits:  Difficulty walking, running, playing on beach with grandson, athletic activities, stairs, sleeping, lower extremity dressing, weight bearing activities, jumping, etc, and decreased quality of life.  Relevant past medical history and comorbidities include low back pain with referral to right thigh, chronic allergic asthma (epi pen in car), acute sinusitis (two surgeries), skin spot removed, hypertension, dental issues, reflux,  hx of cardiac catheterization to prove she doesn't have CHF (heart was healthy), cholecystectomy, osteoarthritis, hx L knee pain (baker's cyst/meniscus). Patient denies hx of cancer, stroke, seizures, lung problem (besides asthma), major cardiac events, diabetes, unexplained weight loss, changes in bowel or bladder problems, new onset stumbling or dropping things, low back surgeryor bladder problems, new onset stumbling or dropping things, low back surgery, osteoporosis.    Limitations Lifting;Walking;House hold activities;Other (comment);Standing    Diagnostic tests L MRI report 11/14/2018: "IMPRESSION:  1. Radial tear of the medial  meniscus posterior horn.  2. Subchondral insufficiency fracture of the medial tibial plateau.  3. Mild tricompartmental osteoarthritis.  4. Small joint effusion and Baker cyst."    Patient Stated Goals "I just need to strengthen both knees"    Currently in Pain? No/denies    Pain Onset More than a month ago              OBJECTIVE FOTO = 59 (05/07/2021)  MUSCLE PERFORMANCE (MMT): *Indicates pain 04/02/21 05/07/21 Date  Joint/Motion R/L R/L R/L  Hip        Flexion 4/5 4/4+ /  Extension (knee ext) 4/4+ 4+/4+ /  Flexion (knee flex) / 4/4 /  Abduction 4-/4 4+/4+ /  Adduction 3/3 3+/4 /  External rotation 4+/4 4+/4 /  Internal rotation  4/4 4+/4+ /  Knee        Extension 4+*/4+* 4+*/5 /  Flexion 4+*/4* 4+/4+ /  Comments:  04/02/21: B ankles WFL.R knee extension: pain over patella. L knee extension: pain at medial joint line.  R knee flexion: pain over patella, L knee flexion: pain at posterior knee. R hip IR: pain at medial knee. L hip IR/ER: pain at medial joint line. R hip flexion: pain at medial knee. 05/07/21: Pain at medial knees.    TREATMENT:    Manual therapy: to reduce pain and tissue tension, improve range of motion, neuromodulation, in order to promote improved ability to complete functional activities. PRONE - STM to right lumbar paraspinals. SEATED - seated joint mobilizations to B tibiofemoral joints, grade II-IV, distraction 3x30 seconds, IR rotation 2-3x30 seconds each side (feels good).    Modality: (unbilled) Dry needling performed to right lumbar region to decrease pain and spasms along patient's right low back region with patient in prone utilizing (1) dry needle(s) .59mm x 53mm with (3) sticks at R lumbar multifidi ~ L4 and L5 and R Iliocostalis at approximately L4 . Patient educated about the risks and benefits from therapy and verbally consents to treatment.  Dry needling performed by Everlean Alstrom. Graylon Good PT, DPT who is certified in this technique.   Therapeutic  exercise: to centralize symptoms and improve ROM, strength, muscular endurance, and activity tolerance required for successful completion of functional activities.  - prone press up 1x15, 1x5 - measurements to assess progress (See above).   Pt required multimodal cuing for proper technique and to facilitate improved neuromuscular control, strength, range of motion, and functional ability resulting in improved performance and form.     HOME EXERCISE PROGRAM Access Code: [email or web address] URL: [email or web address] Date: 04/17/2021 Prepared by: Rosita Kea   Exercises Bridge with Hip Abduction and Resistance - Ground Touches - 1 sets - 20 reps - 3 seconds hold Wall Sit - 1 x daily - 1 sets - 5 reps - 15 seconds hold Sitting Knee Extension with Resistance - 1 x daily - 1-3 sets - 10 reps - 3 seconds hold Hip Hikes off step - 1 x daily -  2-3 sets - 10 reps    PT Education - 05/07/21 1005     Education Details Exercise purpose/form. Self management techniques. POC, progress    Person(s) Educated Patient    Methods Explanation;Demonstration;Tactile cues;Verbal cues    Comprehension Verbalized understanding;Returned demonstration;Verbal cues required;Tactile cues required;Need further instruction              PT Short Term Goals - 04/02/21 1735       PT SHORT TERM GOAL #1   Title Be independent with initial home exercise program for self-management of symptoms.    Baseline Initial HEP provided at IE (04/02/2021);    Time 2    Period Weeks    Status Achieved    Target Date 04/16/21               PT Long Term Goals - 05/07/21 1002       PT LONG TERM GOAL #1   Title Be independent with a long-term home exercise program for self-management of symptoms.    Baseline Initial HEP provided at IE (04/02/2021); currently participating in appropriate HEP (05/07/2021);    Time 12    Period Weeks    Status Partially Met   TARGET DATE FOR ALL LONG TERM GOALS: 06/25/2021     PT  LONG TERM GOAL #2   Title Demonstrate improved FOTO score to equal or greater than 63 by visit #12 to demonstrate improvement in overall condition and self-reported functional ability.    Baseline 59 (04/02/2021); 65 (04/25/2021); 59 (05/07/21);    Time 12    Period Weeks    Status On-going      PT LONG TERM GOAL #3   Title Improve B hip and knee strength to 4+/5 with no increase in pain to improve patient's ability to complete  valued functional tasks such hiking, stairs, running with less difficulty.    Baseline painful and weak - see objective exam (04/02/2021); improved pain and strength - see objective exam (05/07/2021);    Time 12    Period Weeks    Status New      PT LONG TERM GOAL #4   Title Reduce pain with functional activities to equal or less than 1/10 to allow patient to complete usual activities including ADLs, IADLs, and social engagement with less difficulty.    Baseline 8/10 (04/02/2021); up to 4/10 in the last 2 weeks (05/07/21);    Time 12    Period Weeks    Status Partially Met      PT LONG TERM GOAL #5   Title Complete community, work and/or recreational activities without limitation due to current condition.    Baseline Functional Limitations: walking, running, playing on beach with grandson, athletic activities, stairs, sleeping, lower extremity dressing, weight bearing activities, jumping (04/02/2021); improved overall but continues to have pain and difficulty with stairs, lifting, bending, moving furnature and item( 05/07/21);    Time 12    Period Weeks    Status Partially Met                   Plan - 05/07/21 1000     Clinical Impression Statement Patient has attended 10 physical therapy sessions this episode of care. Overall she has made some progress towards goals, reporting improved FOTO score at last measurement and improvements in functional activity tolerance, confidence, strength, and pain levels. She continues to struggle with pain with increased  activity volume including stairs, lifting, and moving activities and currently has worse symptoms  and decreased FOTO score after a weekend of helping her daughter move. Patient continues to have bothersome R sided low back pain that responds well to dry needling but continues to return and interfere with her ability to complete her knee rehab so this has been addressed as needed. Patient would benefit from continued management of limiting condition by skilled physical therapist to address remaining impairments and functional limitations to work towards stated goals and return to PLOF or maximal functional independence.    Personal Factors and Comorbidities Age;Comorbidity 3+;Time since onset of injury/illness/exacerbation;Past/Current Experience    Comorbidities Relevant past medical history and comorbidities include low back pain with referral to right thigh, chronic allergic asthma (epi pen in car), acute sinusitis (two surgeries), skin spot removed, hypertension, dental issues, reflux, hx of cardiac catheterization to prove she doesn't have CHF (heart was healthy), cholecystectomy, osteoarthritis, hx L knee pain (baker's cyst/meniscus).    Examination-Activity Limitations Locomotion Level;Transfers;Stand;Carry;Squat;Stairs;Dressing;Sleep;Lift    Examination-Participation Restrictions Community Activity;Interpersonal Relationship;Laundry;Shop;Yard Work   Difficulty walking, running, playing on beach with grandson, athletic activities, stairs, sleeping, lower extremity dressing, weight bearing activities, jumping, etc, and decreased quality of life.   Stability/Clinical Decision Making Evolving/Moderate complexity    Rehab Potential Good    PT Frequency 2x / week    PT Duration 12 weeks    PT Treatment/Interventions ADLs/Self Care Home Management;Aquatic Therapy;Cryotherapy;Moist Heat;Electrical Stimulation;Therapeutic activities;Gait training;Therapeutic exercise;Neuromuscular re-education;Patient/family  education;Passive range of motion;Dry needling;Spinal Manipulations;Joint Manipulations;Manual techniques    PT Next Visit Plan update HEP as appropriate, B LE and functional strengthening/balance as tolerated    PT Home Exercise Plan Medbridge  Access Code: [email or web address]    Consulted and Agree with Plan of Care Patient             Patient will benefit from skilled therapeutic intervention in order to improve the following deficits and impairments:  Pain, Decreased coordination, Decreased mobility, Increased muscle spasms, Postural dysfunction, Decreased activity tolerance, Decreased endurance, Decreased range of motion, Decreased strength, Hypomobility, Impaired perceived functional ability, Difficulty walking, Abnormal gait, Improper body mechanics, Impaired flexibility, Decreased balance  Visit Diagnosis: Chronic pain of left knee  Chronic pain of right knee  Difficulty in walking, not elsewhere classified  Muscle weakness (generalized)     Problem List Patient Active Problem List   Diagnosis Date Noted   Low back pain 11/07/2020   Dysuria 10/24/2020   Pain, dental 09/29/2020   Hypercholesterolemia 04/08/2020   Left knee pain 04/08/2020   COVID-19 virus infection 10/31/2019   Stress 09/25/2019   Joint stiffness of hand 06/26/2018   Plantar fasciitis 06/26/2018   Left leg pain 02/21/2018   Hyperglycemia 02/21/2018   Visual disturbance 02/20/2016   Near syncope 02/20/2016   Right lower quadrant pain 08/04/2015   Abnormal liver function tests 08/04/2015   UTI symptoms 03/31/2015   Health care maintenance 02/03/2015   Cough 12/21/2014   History of colonic polyps 05/29/2014   Leukopenia 07/07/2013   Asthma 10/10/2012   Allergic rhinitis 10/10/2012   GERD (gastroesophageal reflux disease) 10/10/2012   Hypertension 10/10/2012   Everlean Alstrom. Graylon Good, PT, DPT 05/07/21, 10:06 AM  [facility] [address], [place], [address] Phone: [phone]   Fax:  [phone]  Name: Chloe Moyer MRN: [id number] Date of Birth: [birth year]

## 2021-05-09 ENCOUNTER — Encounter: Payer: Self-pay | Admitting: Physical Therapy

## 2021-05-09 ENCOUNTER — Other Ambulatory Visit: Payer: Self-pay

## 2021-05-09 ENCOUNTER — Ambulatory Visit: Payer: PPO | Admitting: Physical Therapy

## 2021-05-09 DIAGNOSIS — M25562 Pain in left knee: Secondary | ICD-10-CM | POA: Diagnosis not present

## 2021-05-09 DIAGNOSIS — M6281 Muscle weakness (generalized): Secondary | ICD-10-CM

## 2021-05-09 DIAGNOSIS — R262 Difficulty in walking, not elsewhere classified: Secondary | ICD-10-CM

## 2021-05-09 DIAGNOSIS — G8929 Other chronic pain: Secondary | ICD-10-CM

## 2021-05-09 NOTE — Therapy (Signed)
[facility] [address], [place], [address] Phone: [phone]   Fax:  [phone]  Physical Therapy Treatment  Patient Details  Name: Chloe Moyer MRN: [id number] Date of Birth: June 25, 1951 Referring Provider (PT): Einar Pheasant, MD   Encounter Date: 05/09/2021   PT End of Session - 05/09/21 0938     Visit Number 11    Number of Visits 24    Date for PT Re-Evaluation 06/25/21    Authorization Type [employer] reporting period from 05/07/2021    Progress Note Due on Visit 49    PT Start Time 0904    PT Stop Time 0944    PT Time Calculation (min) 40 min    Activity Tolerance Patient tolerated treatment well    Behavior During Therapy [facility] for tasks assessed/performed             Past Medical History:  Diagnosis Date   Allergic rhinitis    Arthritis    Asthma    Diverticulosis, sigmoid    GERD (gastroesophageal reflux disease)    History of hiatal hernia    Hypertension 06-12-2005   Ulcer     Past Surgical History:  Procedure Laterality Date   BREAST BIOPSY Right 09/01/2017   Affirm Bx of 2 areas- both areas were fibroadenomatoid change    BREAST CYST ASPIRATION Left 1990   CARDIAC CATHETERIZATION  July 2012   CHOLECYSTECTOMY  1991   LIPOMA EXCISION  4/99   Left flank area   NASAL SINUS SURGERY  July 2012   TUBAL LIGATION  1984    There were no vitals filed for this visit.   Subjective Assessment - 05/09/21 0924     Subjective Patient reports she is feeling pretty good today. Has 1/10 pain at the medial aspect of R knee. Back is feeling pretty good but would like to do dry needling there in anticipation of being away from PT for a couple of weeks. Felt okay after last PT session.    Pertinent History Patient is a 70 y.o. female who presents to outpatient physical therapy with a referral for medical diagnosispain in both knees, unspecified chronicity. This patient's chief  complaints chronic bilateral knee pain, L > R, leading to the following functional deficits:  Difficulty walking, running, playing on beach with grandson, athletic activities, stairs, sleeping, lower extremity dressing, weight bearing activities, jumping, etc, and decreased quality of life.  Relevant past medical history and comorbidities include low back pain with referral to right thigh, chronic allergic asthma (epi pen in car), acute sinusitis (two surgeries), skin spot removed, hypertension, dental issues, reflux, hx of cardiac catheterization to prove she doesn't have CHF (heart was healthy), cholecystectomy, osteoarthritis, hx L knee pain (baker's cyst/meniscus). Patient denies hx of cancer, stroke, seizures, lung problem (besides asthma), major cardiac events, diabetes, unexplained weight loss, changes in bowel or bladder problems, new onset stumbling or dropping things, low back surgeryor bladder problems, new onset stumbling or dropping things, low back surgery, osteoporosis.    Limitations Lifting;Walking;House hold activities;Other (comment);Standing    Diagnostic tests L MRI report 11/14/2018: "IMPRESSION:  1. Radial tear of the medial meniscus posterior horn.  2. Subchondral insufficiency fracture of the medial tibial plateau.  3. Mild tricompartmental osteoarthritis.  4. Small joint effusion and Baker cyst."    Patient Stated Goals "I just need to strengthen both knees"    Currently in Pain? Yes    Pain Score 1  Pain Onset More than a month ago            OBJECTIVE FOTO = 59 (05/07/2021)    TTREATMENT:    Manual therapy: to reduce pain and tissue tension, improve range of motion, neuromodulation, in order to promote improved ability to complete functional activities. PRONE - STM to right lumbar paraspinals.   Modality: (unbilled) Dry needling performed to right lumbar region to decrease pain and spasms along patient's right low back region with patient in prone utilizing (1)  dry needle(s) .14m x 729mwith (3) sticks at R lumbar multifidi ~ L4 and L5. Patient educated about the risks and benefits from therapy and verbally consents to treatment.  Dry needling performed by SaEverlean AlstromSnGraylon GoodT, DPT who is certified in this technique.   Therapeutic exercise: to centralize symptoms and improve ROM, strength, muscular endurance, and activity tolerance required for successful completion of functional activities.  - prone press up 1x21 - Total gym squats, slow tempo, level 24, 1x30 - standing hip hike off 2x4 board, 3x10 each side, U UE support for balance. - total gym single leg squat, slow tempo, 1x10 each side, level 12 (attempted level 18 but too difficult).  - standing lumbar extension over plinth, 1x10   Pt required multimodal cuing for proper technique and to facilitate improved neuromuscular control, strength, range of motion, and functional ability resulting in improved performance and form.     HOME EXERCISE PROGRAM Access Code: [email or web address]: [email or web address] Date: 04/17/2021 Prepared by: SaRosita Kea Exercises Bridge with Hip Abduction and Resistance - Ground Touches - 1 sets - 20 reps - 3 seconds hold Wall Sit - 1 x daily - 1 sets - 5 reps - 15 seconds hold Sitting Knee Extension with Resistance - 1 x daily - 1-3 sets - 10 reps - 3 seconds hold Hip Hikes off step - 1 x daily - 2-3 sets - 10 reps   PT Education - 05/09/21 1422     Education Details Exercise purpose/form. Self management techniques.    Person(s) Educated Patient    Methods Explanation;Demonstration;Tactile cues;Verbal cues    Comprehension Verbalized understanding;Returned demonstration;Verbal cues required;Tactile cues required;Need further instruction              PT Short Term Goals - 04/02/21 1735       PT SHORT TERM GOAL #1   Title Be independent with initial home exercise program for self-management of symptoms.    Baseline Initial HEP provided at IE  (04/02/2021);    Time 2    Period Weeks    Status Achieved    Target Date 04/16/21               PT Long Term Goals - 05/07/21 1002       PT LONG TERM GOAL #1   Title Be independent with a long-term home exercise program for self-management of symptoms.    Baseline Initial HEP provided at IE (04/02/2021); currently participating in appropriate HEP (05/07/2021);    Time 12    Period Weeks    Status Partially Met   TARGET DATE FOR ALL LONG TERM GOALS: 06/25/2021     PT LONG TERM GOAL #2   Title Demonstrate improved FOTO score to equal or greater than 63 by visit #12 to demonstrate improvement in overall condition and self-reported functional ability.    Baseline 59 (04/02/2021); 65 (04/25/2021); 59 (05/07/21);    Time 12    Period Weeks  Status On-going      PT LONG TERM GOAL #3   Title Improve B hip and knee strength to 4+/5 with no increase in pain to improve patient's ability to complete  valued functional tasks such hiking, stairs, running with less difficulty.    Baseline painful and weak - see objective exam (04/02/2021); improved pain and strength - see objective exam (05/07/2021);    Time 12    Period Weeks    Status New      PT LONG TERM GOAL #4   Title Reduce pain with functional activities to equal or less than 1/10 to allow patient to complete usual activities including ADLs, IADLs, and social engagement with less difficulty.    Baseline 8/10 (04/02/2021); up to 4/10 in the last 2 weeks (05/07/21);    Time 12    Period Weeks    Status Partially Met      PT LONG TERM GOAL #5   Title Complete community, work and/or recreational activities without limitation due to current condition.    Baseline Functional Limitations: walking, running, playing on beach with grandson, athletic activities, stairs, sleeping, lower extremity dressing, weight bearing activities, jumping (04/02/2021); improved overall but continues to have pain and difficulty with stairs, lifting, bending, moving  furnature and item( 05/07/21);    Time 12    Period Weeks    Status Partially Met                   Plan - 05/09/21 1535     Clinical Impression Statement Patient tolerated treatment well overall and was able to progress to single leg squat on total gym this session. Will continue to assess for tolerance at upcoming visits with goal to increased volume and activity tolerance. Patient would benefit from continued management of limiting condition by skilled physical therapist to address remaining impairments and functional limitations to work towards stated goals and return to PLOF or maximal functional independence.    Personal Factors and Comorbidities Age;Comorbidity 3+;Time since onset of injury/illness/exacerbation;Past/Current Experience    Comorbidities Relevant past medical history and comorbidities include low back pain with referral to right thigh, chronic allergic asthma (epi pen in car), acute sinusitis (two surgeries), skin spot removed, hypertension, dental issues, reflux, hx of cardiac catheterization to prove she doesn't have CHF (heart was healthy), cholecystectomy, osteoarthritis, hx L knee pain (baker's cyst/meniscus).    Examination-Activity Limitations Locomotion Level;Transfers;Stand;Carry;Squat;Stairs;Dressing;Sleep;Lift    Examination-Participation Restrictions Community Activity;Interpersonal Relationship;Laundry;Shop;Yard Work   Difficulty walking, running, playing on beach with grandson, athletic activities, stairs, sleeping, lower extremity dressing, weight bearing activities, jumping, etc, and decreased quality of life.   Stability/Clinical Decision Making Evolving/Moderate complexity    Rehab Potential Good    PT Frequency 2x / week    PT Duration 12 weeks    PT Treatment/Interventions ADLs/Self Care Home Management;Aquatic Therapy;Cryotherapy;Moist Heat;Electrical Stimulation;Therapeutic activities;Gait training;Therapeutic exercise;Neuromuscular  re-education;Patient/family education;Passive range of motion;Dry needling;Spinal Manipulations;Joint Manipulations;Manual techniques    PT Next Visit Plan update HEP as appropriate, B LE and functional strengthening/balance as tolerated    PT Home Exercise Plan Medbridge  Access Code: [email or web address]    Consulted and Agree with Plan of Care Patient             Patient will benefit from skilled therapeutic intervention in order to improve the following deficits and impairments:  Pain, Decreased coordination, Decreased mobility, Increased muscle spasms, Postural dysfunction, Decreased activity tolerance, Decreased endurance, Decreased range of motion, Decreased strength, Hypomobility, Impaired perceived functional ability, Difficulty  walking, Abnormal gait, Improper body mechanics, Impaired flexibility, Decreased balance  Visit Diagnosis: Chronic pain of left knee  Chronic pain of right knee  Difficulty in walking, not elsewhere classified  Muscle weakness (generalized)     Problem List Patient Active Problem List   Diagnosis Date Noted   Low back pain 11/07/2020   Dysuria 10/24/2020   Pain, dental 09/29/2020   Hypercholesterolemia 04/08/2020   Left knee pain 04/08/2020   COVID-19 virus infection 10/31/2019   Stress 09/25/2019   Joint stiffness of hand 06/26/2018   Plantar fasciitis 06/26/2018   Left leg pain 02/21/2018   Hyperglycemia 02/21/2018   Visual disturbance 02/20/2016   Near syncope 02/20/2016   Right lower quadrant pain 08/04/2015   Abnormal liver function tests 08/04/2015   UTI symptoms 03/31/2015   Health care maintenance 02/03/2015   Cough 12/21/2014   History of colonic polyps 05/29/2014   Leukopenia 07/07/2013   Asthma 10/10/2012   Allergic rhinitis 10/10/2012   GERD (gastroesophageal reflux disease) 10/10/2012   Hypertension 10/10/2012    Everlean Alstrom. Graylon Good, PT, DPT 05/09/21, 3:36 PM   [facility] [address], [place], [address] Phone: [phone]   Fax:  [phone]  Name: Chloe Moyer MRN: [id number] Date of Birth: [birth year]

## 2021-05-13 ENCOUNTER — Ambulatory Visit: Payer: PPO | Admitting: Physical Therapy

## 2021-05-13 ENCOUNTER — Encounter: Payer: Self-pay | Admitting: Physical Therapy

## 2021-05-13 ENCOUNTER — Other Ambulatory Visit: Payer: Self-pay

## 2021-05-13 DIAGNOSIS — M25562 Pain in left knee: Secondary | ICD-10-CM

## 2021-05-13 DIAGNOSIS — M6281 Muscle weakness (generalized): Secondary | ICD-10-CM

## 2021-05-13 DIAGNOSIS — M545 Low back pain, unspecified: Secondary | ICD-10-CM

## 2021-05-13 DIAGNOSIS — R262 Difficulty in walking, not elsewhere classified: Secondary | ICD-10-CM

## 2021-05-13 DIAGNOSIS — G8929 Other chronic pain: Secondary | ICD-10-CM

## 2021-05-13 NOTE — Therapy (Signed)
[facility] [address], [place], [address] Phone: [phone]   Fax:  [phone]  Physical Therapy Treatment  Patient Details  Name: Chloe Moyer MRN: [id number] Date of Birth: 05/07/1951 Referring Provider (PT): Einar Pheasant, MD   Encounter Date: 05/13/2021   PT End of Session - 05/13/21 1638     Visit Number 12    Number of Visits 24    Date for PT Re-Evaluation 06/25/21    Authorization Type [employer] reporting period from 05/07/2021    Progress Note Due on Visit 12    PT Start Time 1603    PT Stop Time 0932    PT Time Calculation (min) 40 min    Activity Tolerance Patient tolerated treatment well    Behavior During Therapy [facility] for tasks assessed/performed             Past Medical History:  Diagnosis Date   Allergic rhinitis    Arthritis    Asthma    Diverticulosis, sigmoid    GERD (gastroesophageal reflux disease)    History of hiatal hernia    Hypertension 06-12-2005   Ulcer     Past Surgical History:  Procedure Laterality Date   BREAST BIOPSY Right 09/01/2017   Affirm Bx of 2 areas- both areas were fibroadenomatoid change    BREAST CYST ASPIRATION Left 1990   CARDIAC CATHETERIZATION  July 2012   CHOLECYSTECTOMY  1991   LIPOMA EXCISION  4/99   Left flank area   NASAL SINUS SURGERY  July 2012   TUBAL LIGATION  1984    There were no vitals filed for this visit.   Subjective Assessment - 05/13/21 1632     Subjective Patient reports she is feeling well today and has 1/10 pain at the medial R knee. R low back a bit painful at 2/10. Has been working all morning. States she noticed she went down about 5 steps normally at church this weekend which is an improvement for her. Signed up for a cruise in January and her goal now is to be able to go up/down the steps on the ship normally (~15 steps).  Would like to do needling today.    Pertinent History Patient is a 70 y.o.  female who presents to outpatient physical therapy with a referral for medical diagnosispain in both knees, unspecified chronicity. This patient's chief complaints chronic bilateral knee pain, L > R, leading to the following functional deficits:  Difficulty walking, running, playing on beach with grandson, athletic activities, stairs, sleeping, lower extremity dressing, weight bearing activities, jumping, etc, and decreased quality of life.  Relevant past medical history and comorbidities include low back pain with referral to right thigh, chronic allergic asthma (epi pen in car), acute sinusitis (two surgeries), skin spot removed, hypertension, dental issues, reflux, hx of cardiac catheterization to prove she doesn't have CHF (heart was healthy), cholecystectomy, osteoarthritis, hx L knee pain (baker's cyst/meniscus). Patient denies hx of cancer, stroke, seizures, lung problem (besides asthma), major cardiac events, diabetes, unexplained weight loss, changes in bowel or bladder problems, new onset stumbling or dropping things, low back surgeryor bladder problems, new onset stumbling or dropping things, low back surgery, osteoporosis.    Limitations Lifting;Walking;House hold activities;Other (comment);Standing    Diagnostic tests L MRI report 11/14/2018: "IMPRESSION:  1. Radial tear of the medial meniscus posterior horn.  2. Subchondral insufficiency fracture of the medial tibial plateau.  3. Mild tricompartmental osteoarthritis.  4. Small  joint effusion and Baker cyst."    Patient Stated Goals "I just need to strengthen both knees"    Currently in Pain? Yes    Pain Score 1     Pain Onset More than a month ago             OBJECTIVE FOTO = 59 (05/07/2021)   TTREATMENT:    Manual therapy: to reduce pain and tissue tension, improve range of motion, neuromodulation, in order to promote improved ability to complete functional activities. PRONE - STM to right lumbar paraspinals.   Modality:  (unbilled) Dry needling performed to right lumbar region to decrease pain and spasms along patient's right low back region with patient in prone utilizing (1) dry needle(s) .79m x 746mwith (3) sticks at R lumbar multifidi ~ L4 and L5. Patient educated about the risks and benefits from therapy and verbally consents to treatment.  Dry needling performed by SaEverlean AlstromSnGraylon GoodT, DPT who is certified in this technique.   Therapeutic exercise: to centralize symptoms and improve ROM, strength, muscular endurance, and activity tolerance required for successful completion of functional activities.  - prone press up 1x20 - standing hip hike off 2x4 board, 3x10 each side, U UE support for balance. (Getting easier).  - Total gym squats, slow tempo, level 24, 1x30 - total gym single leg squat, slow tempo, 2x10 each side, level 14   - standing lumbar extension over plinth, 1x10   Pt required multimodal cuing for proper technique and to facilitate improved neuromuscular control, strength, range of motion, and functional ability resulting in improved performance and form.     HOME EXERCISE PROGRAM Access Code: [email or web address]: [email or web address] Date: 04/17/2021 Prepared by: SaRosita Kea Exercises Bridge with Hip Abduction and Resistance - Ground Touches - 1 sets - 20 reps - 3 seconds hold Wall Sit - 1 x daily - 1 sets - 5 reps - 15 seconds hold Sitting Knee Extension with Resistance - 1 x daily - 1-3 sets - 10 reps - 3 seconds hold Hip Hikes off step - 1 x daily - 2-3 sets - 10 reps     PT Education - 05/13/21 1638     Education Details Exercise purpose/form. Self management techniques.    Person(s) Educated Patient    Methods Explanation;Demonstration;Tactile cues;Verbal cues    Comprehension Verbalized understanding;Returned demonstration;Verbal cues required;Tactile cues required;Need further instruction              PT Short Term Goals - 04/02/21 1735       PT SHORT  TERM GOAL #1   Title Be independent with initial home exercise program for self-management of symptoms.    Baseline Initial HEP provided at IE (04/02/2021);    Time 2    Period Weeks    Status Achieved    Target Date 04/16/21               PT Long Term Goals - 05/07/21 1002       PT LONG TERM GOAL #1   Title Be independent with a long-term home exercise program for self-management of symptoms.    Baseline Initial HEP provided at IE (04/02/2021); currently participating in appropriate HEP (05/07/2021);    Time 12    Period Weeks    Status Partially Met   TARGET DATE FOR ALL LONG TERM GOALS: 06/25/2021     PT LONG TERM GOAL #2   Title Demonstrate improved FOTO score to equal or greater than 63 by  visit #12 to demonstrate improvement in overall condition and self-reported functional ability.    Baseline 59 (04/02/2021); 65 (04/25/2021); 59 (05/07/21);    Time 12    Period Weeks    Status On-going      PT LONG TERM GOAL #3   Title Improve B hip and knee strength to 4+/5 with no increase in pain to improve patient's ability to complete  valued functional tasks such hiking, stairs, running with less difficulty.    Baseline painful and weak - see objective exam (04/02/2021); improved pain and strength - see objective exam (05/07/2021);    Time 12    Period Weeks    Status New      PT LONG TERM GOAL #4   Title Reduce pain with functional activities to equal or less than 1/10 to allow patient to complete usual activities including ADLs, IADLs, and social engagement with less difficulty.    Baseline 8/10 (04/02/2021); up to 4/10 in the last 2 weeks (05/07/21);    Time 12    Period Weeks    Status Partially Met      PT LONG TERM GOAL #5   Title Complete community, work and/or recreational activities without limitation due to current condition.    Baseline Functional Limitations: walking, running, playing on beach with grandson, athletic activities, stairs, sleeping, lower extremity dressing,  weight bearing activities, jumping (04/02/2021); improved overall but continues to have pain and difficulty with stairs, lifting, bending, moving furnature and item( 05/07/21);    Time 12    Period Weeks    Status Partially Met                   Plan - 05/13/21 1636     Clinical Impression Statement Patient tolerated treatment well overall and continues to make progress with increased quad loading. Does have limitations due to knee pain and weakness and continues to benefit from dry needling to control low back pian to allow patient to continue working on knee exercises. Patient would benefit from continued management of limiting condition by skilled physical therapist to address remaining impairments and functional limitations to work towards stated goals and return to PLOF or maximal functional independence.    Personal Factors and Comorbidities Age;Comorbidity 3+;Time since onset of injury/illness/exacerbation;Past/Current Experience    Comorbidities Relevant past medical history and comorbidities include low back pain with referral to right thigh, chronic allergic asthma (epi pen in car), acute sinusitis (two surgeries), skin spot removed, hypertension, dental issues, reflux, hx of cardiac catheterization to prove she doesn't have CHF (heart was healthy), cholecystectomy, osteoarthritis, hx L knee pain (baker's cyst/meniscus).    Examination-Activity Limitations Locomotion Level;Transfers;Stand;Carry;Squat;Stairs;Dressing;Sleep;Lift    Examination-Participation Restrictions Community Activity;Interpersonal Relationship;Laundry;Shop;Yard Work   Difficulty walking, running, playing on beach with grandson, athletic activities, stairs, sleeping, lower extremity dressing, weight bearing activities, jumping, etc, and decreased quality of life.   Stability/Clinical Decision Making Evolving/Moderate complexity    Rehab Potential Good    PT Frequency 2x / week    PT Duration 12 weeks    PT  Treatment/Interventions ADLs/Self Care Home Management;Aquatic Therapy;Cryotherapy;Moist Heat;Electrical Stimulation;Therapeutic activities;Gait training;Therapeutic exercise;Neuromuscular re-education;Patient/family education;Passive range of motion;Dry needling;Spinal Manipulations;Joint Manipulations;Manual techniques    PT Next Visit Plan update HEP as appropriate, B LE and functional strengthening/balance as tolerated    PT Home Exercise Plan Medbridge  Access Code: [email or web address]    Consulted and Agree with Plan of Care Patient             Patient  will benefit from skilled therapeutic intervention in order to improve the following deficits and impairments:  Pain, Decreased coordination, Decreased mobility, Increased muscle spasms, Postural dysfunction, Decreased activity tolerance, Decreased endurance, Decreased range of motion, Decreased strength, Hypomobility, Impaired perceived functional ability, Difficulty walking, Abnormal gait, Improper body mechanics, Impaired flexibility, Decreased balance  Visit Diagnosis: Chronic pain of left knee  Chronic pain of right knee  Difficulty in walking, not elsewhere classified  Muscle weakness (generalized)  Chronic right-sided low back pain, unspecified whether sciatica present     Problem List Patient Active Problem List   Diagnosis Date Noted   Low back pain 11/07/2020   Dysuria 10/24/2020   Pain, dental 09/29/2020   Hypercholesterolemia 04/08/2020   Left knee pain 04/08/2020   COVID-19 virus infection 10/31/2019   Stress 09/25/2019   Joint stiffness of hand 06/26/2018   Plantar fasciitis 06/26/2018   Left leg pain 02/21/2018   Hyperglycemia 02/21/2018   Visual disturbance 02/20/2016   Near syncope 02/20/2016   Right lower quadrant pain 08/04/2015   Abnormal liver function tests 08/04/2015   UTI symptoms 03/31/2015   Health care maintenance 02/03/2015   Cough 12/21/2014   History of colonic polyps 05/29/2014   Leukopenia  07/07/2013   Asthma 10/10/2012   Allergic rhinitis 10/10/2012   GERD (gastroesophageal reflux disease) 10/10/2012   Hypertension 10/10/2012   Everlean Alstrom. Graylon Good, PT, DPT 05/13/21, 4:42 PM   [facility] [address] [place], [address] Phone: [phone]   Fax:  [phone]  Name: Chloe Moyer MRN: [id number] Date of Birth: 23-Mar-1951

## 2021-05-14 ENCOUNTER — Encounter: Payer: PPO | Admitting: Physical Therapy

## 2021-05-16 ENCOUNTER — Encounter: Payer: PPO | Admitting: Physical Therapy

## 2021-05-21 ENCOUNTER — Encounter: Payer: PPO | Admitting: Physical Therapy

## 2021-05-23 ENCOUNTER — Encounter: Payer: PPO | Admitting: Physical Therapy

## 2021-05-28 ENCOUNTER — Ambulatory Visit: Payer: PPO | Attending: Internal Medicine | Admitting: Physical Therapy

## 2021-05-28 ENCOUNTER — Encounter: Payer: Self-pay | Admitting: Physical Therapy

## 2021-05-28 ENCOUNTER — Other Ambulatory Visit: Payer: PPO

## 2021-05-28 DIAGNOSIS — M25562 Pain in left knee: Secondary | ICD-10-CM | POA: Insufficient documentation

## 2021-05-28 DIAGNOSIS — R262 Difficulty in walking, not elsewhere classified: Secondary | ICD-10-CM

## 2021-05-28 DIAGNOSIS — G8929 Other chronic pain: Secondary | ICD-10-CM | POA: Insufficient documentation

## 2021-05-28 DIAGNOSIS — M6281 Muscle weakness (generalized): Secondary | ICD-10-CM

## 2021-05-28 DIAGNOSIS — M25561 Pain in right knee: Secondary | ICD-10-CM | POA: Diagnosis not present

## 2021-05-28 NOTE — Therapy (Signed)
[facility] [address] [place], [address] Phone: [phone]   Fax:  [phone]  Physical Therapy Treatment  Patient Details  Name: Chloe Moyer MRN: [id number] Date of Birth: 16-Feb-1951 Referring Provider (PT): Einar Pheasant, MD   Encounter Date: 05/28/2021   PT End of Session - 05/28/21 1526     Visit Number 13    Number of Visits 24    Date for PT Re-Evaluation 06/25/21    Authorization Type [employer] reporting period from 05/07/2021    Progress Note Due on Visit 20    PT Start Time 1520    PT Stop Time 1600    PT Time Calculation (min) 40 min    Activity Tolerance Patient tolerated treatment well    Behavior During Therapy [facility] for tasks assessed/performed             Past Medical History:  Diagnosis Date   Allergic rhinitis    Arthritis    Asthma    Diverticulosis, sigmoid    GERD (gastroesophageal reflux disease)    History of hiatal hernia    Hypertension 06-12-2005   Ulcer     Past Surgical History:  Procedure Laterality Date   BREAST BIOPSY Right 09/01/2017   Affirm Bx of 2 areas- both areas were fibroadenomatoid change    BREAST CYST ASPIRATION Left 1990   CARDIAC CATHETERIZATION  July 2012   CHOLECYSTECTOMY  1991   LIPOMA EXCISION  4/99   Left flank area   NASAL SINUS SURGERY  July 2012   TUBAL LIGATION  1984    There were no vitals filed for this visit.   Subjective Assessment - 05/28/21 1409     Subjective Patient reports 1/10 pain and soreness in her knees today. She climbed extra stairs this weekend while at the beach with her family and describes her knees creeky while going up them. Patient also reports 1/10 pain in her back as well. She described feeling a twinge last night. Patient states HEP compliance.    Pertinent History Patient is a 70 y.o. female who presents to outpatient physical therapy with a referral for medical diagnosispain in both  knees, unspecified chronicity. This patient's chief complaints chronic bilateral knee pain, L > R, leading to the following functional deficits:  Difficulty walking, running, playing on beach with grandson, athletic activities, stairs, sleeping, lower extremity dressing, weight bearing activities, jumping, etc, and decreased quality of life.  Relevant past medical history and comorbidities include low back pain with referral to right thigh, chronic allergic asthma (epi pen in car), acute sinusitis (two surgeries), skin spot removed, hypertension, dental issues, reflux, hx of cardiac catheterization to prove she doesn't have CHF (heart was healthy), cholecystectomy, osteoarthritis, hx L knee pain (baker's cyst/meniscus). Patient denies hx of cancer, stroke, seizures, lung problem (besides asthma), major cardiac events, diabetes, unexplained weight loss, changes in bowel or bladder problems, new onset stumbling or dropping things, low back surgeryor bladder problems, new onset stumbling or dropping things, low back surgery, osteoporosis.    Limitations Lifting;Walking;House hold activities;Other (comment);Standing    Diagnostic tests L MRI report 11/14/2018: "IMPRESSION:  1. Radial tear of the medial meniscus posterior horn.  2. Subchondral insufficiency fracture of the medial tibial plateau.  3. Mild tricompartmental osteoarthritis.  4. Small joint effusion and Baker cyst."    Patient Stated Goals "I just need to strengthen both knees"    Currently in Pain? Yes    Pain  Score 1     Pain Onset More than a month ago            OBJECTIVE FOTO = 59 (05/07/2021)   TTREATMENT:    Manual therapy: to reduce pain and tissue tension, improve range of motion, neuromodulation, in order to promote improved ability to complete functional activities. PRONE - STM to right lumbar paraspinals.   Modality: (unbilled) Dry needling performed to right lumbar region to decrease pain and spasms along patient's right low  back region with patient in prone utilizing (1) dry needle(s) .102m x 774mwith (4) sticks at R lumbar multifidi ~ L5-S1. Patient educated about the risks and benefits from therapy and verbally consents to treatment.  Dry needling performed by SaEverlean AlstromSnGraylon GoodT, DPT who is certified in this technique.   Therapeutic exercise: to centralize symptoms and improve ROM, strength, muscular endurance, and activity tolerance required for successful completion of functional activities.  - prone press up 1x20 - standing lateral heel tap/lower off of 4 inch step with U UE support, 1x10 each side. Painful and poor control.  - Total gym squats, slow tempo, level 24, 1x20, 1x10 - total gym single leg squat, slow tempo, 2x10 each side, level 14  - side stepping with green theraband around distal thighs, 1x40 each side. - forward and backwards monster walks with green theraband around distal thighs, 1x40 feet each direction. (Challenging) - standing lumbar extension over plinth, 1x10   Pt required multimodal cuing for proper technique and to facilitate improved neuromuscular control, strength, range of motion, and functional ability resulting in improved performance and form.     HOME EXERCISE PROGRAM Access Code: [email or web address]: [email or web address] Date: 04/17/2021 Prepared by: SaRosita Kea Exercises Bridge with Hip Abduction and Resistance - Ground Touches - 1 sets - 20 reps - 3 seconds hold Wall Sit - 1 x daily - 1 sets - 5 reps - 15 seconds hold Sitting Knee Extension with Resistance - 1 x daily - 1-3 sets - 10 reps - 3 seconds hold Hip Hikes off step - 1 x daily - 2-3 sets - 10 reps      PT Education - 05/28/21 1519     Education Details Exercise purpose/form. Self management techniques.    Person(s) Educated Patient    Methods Explanation;Demonstration;Tactile cues;Verbal cues    Comprehension Verbalized understanding;Returned demonstration;Verbal cues required;Tactile cues  required;Need further instruction              PT Short Term Goals - 04/02/21 1735       PT SHORT TERM GOAL #1   Title Be independent with initial home exercise program for self-management of symptoms.    Baseline Initial HEP provided at IE (04/02/2021);    Time 2    Period Weeks    Status Achieved    Target Date 04/16/21               PT Long Term Goals - 05/07/21 1002       PT LONG TERM GOAL #1   Title Be independent with a long-term home exercise program for self-management of symptoms.    Baseline Initial HEP provided at IE (04/02/2021); currently participating in appropriate HEP (05/07/2021);    Time 12    Period Weeks    Status Partially Met   TARGET DATE FOR ALL LONG TERM GOALS: 06/25/2021     PT LONG TERM GOAL #2   Title Demonstrate improved FOTO score to equal or greater than  63 by visit #12 to demonstrate improvement in overall condition and self-reported functional ability.    Baseline 59 (04/02/2021); 65 (04/25/2021); 59 (05/07/21);    Time 12    Period Weeks    Status On-going      PT LONG TERM GOAL #3   Title Improve B hip and knee strength to 4+/5 with no increase in pain to improve patient's ability to complete  valued functional tasks such hiking, stairs, running with less difficulty.    Baseline painful and weak - see objective exam (04/02/2021); improved pain and strength - see objective exam (05/07/2021);    Time 12    Period Weeks    Status New      PT LONG TERM GOAL #4   Title Reduce pain with functional activities to equal or less than 1/10 to allow patient to complete usual activities including ADLs, IADLs, and social engagement with less difficulty.    Baseline 8/10 (04/02/2021); up to 4/10 in the last 2 weeks (05/07/21);    Time 12    Period Weeks    Status Partially Met      PT LONG TERM GOAL #5   Title Complete community, work and/or recreational activities without limitation due to current condition.    Baseline Functional Limitations:  walking, running, playing on beach with grandson, athletic activities, stairs, sleeping, lower extremity dressing, weight bearing activities, jumping (04/02/2021); improved overall but continues to have pain and difficulty with stairs, lifting, bending, moving furnature and item( 05/07/21);    Time 12    Period Weeks    Status Partially Met                   Plan - 05/28/21 2051     Clinical Impression Statement Patient tolerated treatment well overall but did have some difficulty with attempt to progress to single leg lateral heel tap from 4 inch step due to pain and reluctance to flex standing knee. Discontinued after 10 reps each side to be re-visited in the future as appropriate. Continued with hip and quad strengthening with good tolerance. Advised on how to complete monster walks at home next week while she is back at the beach. Repeated dry needling to address back pain to allow her to continue with her knee exercises. Patient would benefit from continued management of limiting condition by skilled physical therapist to address remaining impairments and functional limitations to work towards stated goals and return to PLOF or maximal functional independence.    Personal Factors and Comorbidities Age;Comorbidity 3+;Time since onset of injury/illness/exacerbation;Past/Current Experience    Comorbidities Relevant past medical history and comorbidities include low back pain with referral to right thigh, chronic allergic asthma (epi pen in car), acute sinusitis (two surgeries), skin spot removed, hypertension, dental issues, reflux, hx of cardiac catheterization to prove she doesn't have CHF (heart was healthy), cholecystectomy, osteoarthritis, hx L knee pain (baker's cyst/meniscus).    Examination-Activity Limitations Locomotion Level;Transfers;Stand;Carry;Squat;Stairs;Dressing;Sleep;Lift    Examination-Participation Restrictions Community Activity;Interpersonal Relationship;Laundry;Shop;Yard  Work   Difficulty walking, running, playing on beach with grandson, athletic activities, stairs, sleeping, lower extremity dressing, weight bearing activities, jumping, etc, and decreased quality of life.   Stability/Clinical Decision Making Evolving/Moderate complexity    Rehab Potential Good    PT Frequency 2x / week    PT Duration 12 weeks    PT Treatment/Interventions ADLs/Self Care Home Management;Aquatic Therapy;Cryotherapy;Moist Heat;Electrical Stimulation;Therapeutic activities;Gait training;Therapeutic exercise;Neuromuscular re-education;Patient/family education;Passive range of motion;Dry needling;Spinal Manipulations;Joint Manipulations;Manual techniques    PT Next Visit  Plan update HEP as appropriate, B LE and functional strengthening/balance as tolerated    PT Home Exercise Plan Medbridge  Access Code: [email or web address]    Consulted and Agree with Plan of Care Patient             Patient will benefit from skilled therapeutic intervention in order to improve the following deficits and impairments:  Pain, Decreased coordination, Decreased mobility, Increased muscle spasms, Postural dysfunction, Decreased activity tolerance, Decreased endurance, Decreased range of motion, Decreased strength, Hypomobility, Impaired perceived functional ability, Difficulty walking, Abnormal gait, Improper body mechanics, Impaired flexibility, Decreased balance  Visit Diagnosis: Chronic pain of left knee  Chronic pain of right knee  Difficulty in walking, not elsewhere classified  Muscle weakness (generalized)     Problem List Patient Active Problem List   Diagnosis Date Noted   Low back pain 11/07/2020   Dysuria 10/24/2020   Pain, dental 09/29/2020   Hypercholesterolemia 04/08/2020   Left knee pain 04/08/2020   COVID-19 virus infection 10/31/2019   Stress 09/25/2019   Joint stiffness of hand 06/26/2018   Plantar fasciitis 06/26/2018   Left leg pain 02/21/2018   Hyperglycemia 02/21/2018    Visual disturbance 02/20/2016   Near syncope 02/20/2016   Right lower quadrant pain 08/04/2015   Abnormal liver function tests 08/04/2015   UTI symptoms 03/31/2015   Health care maintenance 02/03/2015   Cough 12/21/2014   History of colonic polyps 05/29/2014   Leukopenia 07/07/2013   Asthma 10/10/2012   Allergic rhinitis 10/10/2012   GERD (gastroesophageal reflux disease) 10/10/2012   Hypertension 10/10/2012    Everlean Alstrom. Graylon Good, PT, DPT 05/28/21, 8:52 PM   Student physical therapist under direct supervision of licensed physical therapists during the entirety of the session.    [facility] [address], [place], [address] Phone: [phone]   Fax:  [phone]  Name: NANCEE BROWNRIGG MRN: [id number] Date of Birth: 1951/03/24

## 2021-05-30 ENCOUNTER — Ambulatory Visit: Payer: PPO | Admitting: Internal Medicine

## 2021-05-30 ENCOUNTER — Encounter: Payer: Self-pay | Admitting: Physical Therapy

## 2021-05-30 ENCOUNTER — Ambulatory Visit: Payer: PPO | Admitting: Physical Therapy

## 2021-05-30 DIAGNOSIS — M25562 Pain in left knee: Secondary | ICD-10-CM | POA: Diagnosis not present

## 2021-05-30 DIAGNOSIS — M6281 Muscle weakness (generalized): Secondary | ICD-10-CM

## 2021-05-30 DIAGNOSIS — G8929 Other chronic pain: Secondary | ICD-10-CM

## 2021-05-30 DIAGNOSIS — R262 Difficulty in walking, not elsewhere classified: Secondary | ICD-10-CM

## 2021-05-30 NOTE — Therapy (Signed)
[facility] [address] [place], [address] Phone: [phone]   Fax:  [phone]  Physical Therapy Treatment  Patient Details  Name: Chloe Moyer MRN: [id number] Date of Birth: [birth year] Referring Provider (PT): Einar Pheasant, MD   Encounter Date: 05/30/2021   PT End of Session - 05/30/21 1453     Visit Number 14    Number of Visits 24    Date for PT Re-Evaluation 06/25/21    Authorization Type [employer] reporting period from 05/07/2021    Progress Note Due on Visit 31    PT Start Time 1433    PT Stop Time 1513    PT Time Calculation (min) 40 min    Activity Tolerance Patient tolerated treatment well    Behavior During Therapy [name] for tasks assessed/performed             Past Medical History:  Diagnosis Date   Allergic rhinitis    Arthritis    Asthma    Diverticulosis, sigmoid    GERD (gastroesophageal reflux disease)    History of hiatal hernia    Hypertension 06-12-2005   Ulcer     Past Surgical History:  Procedure Laterality Date   BREAST BIOPSY Right 09/01/2017   Affirm Bx of 2 areas- both areas were fibroadenomatoid change    BREAST CYST ASPIRATION Left 1990   CARDIAC CATHETERIZATION  July 2012   CHOLECYSTECTOMY  1991   LIPOMA EXCISION  4/99   Left flank area   NASAL SINUS SURGERY  July 2012   TUBAL LIGATION  1984    There were no vitals filed for this visit.   Subjective Assessment - 05/30/21 1434     Subjective Patient reports she is feeling well with no pain upon arrival. She was really sore yesterday in her lateral hips and glutes, some in her knees. Will be at the beach next week.    Pertinent History Patient is a 70 y.o. female who presents to outpatient physical therapy with a referral for medical diagnosispain in both knees, unspecified chronicity. This patient's chief complaints chronic bilateral knee pain, L > R, leading to the following functional  deficits:  Difficulty walking, running, playing on beach with grandson, athletic activities, stairs, sleeping, lower extremity dressing, weight bearing activities, jumping, etc, and decreased quality of life.  Relevant past medical history and comorbidities include low back pain with referral to right thigh, chronic allergic asthma (epi pen in car), acute sinusitis (two surgeries), skin spot removed, hypertension, dental issues, reflux, hx of cardiac catheterization to prove she doesn't have CHF (heart was healthy), cholecystectomy, osteoarthritis, hx L knee pain (baker's cyst/meniscus). Patient denies hx of cancer, stroke, seizures, lung problem (besides asthma), major cardiac events, diabetes, unexplained weight loss, changes in bowel or bladder problems, new onset stumbling or dropping things, low back surgeryor bladder problems, new onset stumbling or dropping things, low back surgery, osteoporosis.    Limitations Lifting;Walking;House hold activities;Other (comment);Standing    Diagnostic tests L MRI report 11/14/2018: "IMPRESSION:  1. Radial tear of the medial meniscus posterior horn.  2. Subchondral insufficiency fracture of the medial tibial plateau.  3. Mild tricompartmental osteoarthritis.  4. Small joint effusion and Baker cyst."    Patient Stated Goals "I just need to strengthen both knees"    Currently in Pain? No/denies    Pain Onset More than a month ago             OBJECTIVE FOTO =  61 (05/30/2021)   TTREATMENT:    Manual therapy: to reduce pain and tissue tension, improve range of motion, neuromodulation, in order to promote improved ability to complete functional activities. PRONE - STM to right lumbar paraspinals.   Modality: (unbilled) Dry needling performed to right lumbar region to decrease pain and spasms along patient's right low back region with patient in prone utilizing (2) dry needle(s) .50m x 743mwith (4) sticks at R lumbar multifidi ~ L3-L5. Patient educated about  the risks and benefits from therapy and verbally consents to treatment.  Dry needling performed by SaEverlean AlstromSnGraylon GoodT, DPT who is certified in this technique.   Therapeutic exercise: to centralize symptoms and improve ROM, strength, muscular endurance, and activity tolerance required for successful completion of functional activities.  - prone press up 1x20 - Total gym squats, slow tempo, level 26, 1x32 - total gym single leg squat, slow tempo, 3x10 each side, level 16  - side stepping with green theraband around distal thighs, 2x40 each side. - forward and backwards monster walks with green theraband around distal thighs, 1x40 feet each direction.  - Education on HEP including handout    Pt required multimodal cuing for proper technique and to facilitate improved neuromuscular control, strength, range of motion, and functional ability resulting in improved performance and form.     HOME EXERCISE PROGRAM Access Code: [email or web address]: [email or web address] Date: 05/30/2021 Prepared by: SaRosita KeaExercises Bridge with Hip Abduction and Resistance - Ground Touches - 1 sets - 20 reps - 3 seconds hold Wall Sit - 1 x daily - 1 sets - 20 reps - 5 seconds hold Sitting Knee Extension with Resistance - 1 x daily - 1-3 sets - 10 reps - 3 seconds hold Hip Hikes off step - 1 x daily - 2-3 sets - 10 reps Forward Backward Monster Walk with Band at Thighs and Counter Support - 1 x daily - 1-3 sets - 40 reps Side Stepping with Resistance at Thighs - 1 x daily - 1-3 sets - 40 reps   PT Education - 05/30/21 1453     Education Details Exercise purpose/form. Self management techniques. HEP    Person(s) Educated Patient    Methods Explanation;Demonstration;Tactile cues;Verbal cues;Handout    Comprehension Verbalized understanding;Returned demonstration;Verbal cues required;Tactile cues required;Need further instruction              PT Short Term Goals - 04/02/21 1735       PT SHORT  TERM GOAL #1   Title Be independent with initial home exercise program for self-management of symptoms.    Baseline Initial HEP provided at IE (04/02/2021);    Time 2    Period Weeks    Status Achieved    Target Date 04/16/21               PT Long Term Goals - 05/07/21 1002       PT LONG TERM GOAL #1   Title Be independent with a long-term home exercise program for self-management of symptoms.    Baseline Initial HEP provided at IE (04/02/2021); currently participating in appropriate HEP (05/07/2021);    Time 12    Period Weeks    Status Partially Met   TARGET DATE FOR ALL LONG TERM GOALS: 06/25/2021     PT LONG TERM GOAL #2   Title Demonstrate improved FOTO score to equal or greater than 63 by visit #12 to demonstrate improvement in overall condition and self-reported functional ability.  Baseline 59 (04/02/2021); 65 (04/25/2021); 59 (05/07/21);    Time 12    Period Weeks    Status On-going      PT LONG TERM GOAL #3   Title Improve B hip and knee strength to 4+/5 with no increase in pain to improve patient's ability to complete  valued functional tasks such hiking, stairs, running with less difficulty.    Baseline painful and weak - see objective exam (04/02/2021); improved pain and strength - see objective exam (05/07/2021);    Time 12    Period Weeks    Status New      PT LONG TERM GOAL #4   Title Reduce pain with functional activities to equal or less than 1/10 to allow patient to complete usual activities including ADLs, IADLs, and social engagement with less difficulty.    Baseline 8/10 (04/02/2021); up to 4/10 in the last 2 weeks (05/07/21);    Time 12    Period Weeks    Status Partially Met      PT LONG TERM GOAL #5   Title Complete community, work and/or recreational activities without limitation due to current condition.    Baseline Functional Limitations: walking, running, playing on beach with grandson, athletic activities, stairs, sleeping, lower extremity dressing,  weight bearing activities, jumping (04/02/2021); improved overall but continues to have pain and difficulty with stairs, lifting, bending, moving furnature and item( 05/07/21);    Time 12    Period Weeks    Status Partially Met                   Plan - 05/30/21 1745     Clinical Impression Statement Patient tolerated treatment well overall and was able to continue with banded walking exercises for improved glute strength. Updated HEP to include these exercises while she is away from PT at the beach next week. Also able to advance single leg squat on total gym without increased knee irritation. Does continue to report limitations due to lack of activity tolerance for stairs and other activities that require heavy knee load. Lumbar spine again addressed today to prevent back pain from disrupting ability to participate in exercises targeting knees. Patient would benefit from continued management of limiting condition by skilled physical therapist to address remaining impairments and functional limitations to work towards stated goals and return to PLOF or maximal functional independence.    Personal Factors and Comorbidities Age;Comorbidity 3+;Time since onset of injury/illness/exacerbation;Past/Current Experience    Comorbidities Relevant past medical history and comorbidities include low back pain with referral to right thigh, chronic allergic asthma (epi pen in car), acute sinusitis (two surgeries), skin spot removed, hypertension, dental issues, reflux, hx of cardiac catheterization to prove she doesn't have CHF (heart was healthy), cholecystectomy, osteoarthritis, hx L knee pain (baker's cyst/meniscus).    Examination-Activity Limitations Locomotion Level;Transfers;Stand;Carry;Squat;Stairs;Dressing;Sleep;Lift    Examination-Participation Restrictions Community Activity;Interpersonal Relationship;Laundry;Shop;Yard Work   Difficulty walking, running, playing on beach with grandson, athletic  activities, stairs, sleeping, lower extremity dressing, weight bearing activities, jumping, etc, and decreased quality of life.   Stability/Clinical Decision Making Evolving/Moderate complexity    Rehab Potential Good    PT Frequency 2x / week    PT Duration 12 weeks    PT Treatment/Interventions ADLs/Self Care Home Management;Aquatic Therapy;Cryotherapy;Moist Heat;Electrical Stimulation;Therapeutic activities;Gait training;Therapeutic exercise;Neuromuscular re-education;Patient/family education;Passive range of motion;Dry needling;Spinal Manipulations;Joint Manipulations;Manual techniques    PT Next Visit Plan update HEP as appropriate, B LE and functional strengthening/balance as tolerated    PT Home Exercise Plan  Medbridge  Access Code: [email or web address]    Consulted and Agree with Plan of Care Patient             Patient will benefit from skilled therapeutic intervention in order to improve the following deficits and impairments:  Pain, Decreased coordination, Decreased mobility, Increased muscle spasms, Postural dysfunction, Decreased activity tolerance, Decreased endurance, Decreased range of motion, Decreased strength, Hypomobility, Impaired perceived functional ability, Difficulty walking, Abnormal gait, Improper body mechanics, Impaired flexibility, Decreased balance  Visit Diagnosis: Chronic pain of left knee  Chronic pain of right knee  Difficulty in walking, not elsewhere classified  Muscle weakness (generalized)     Problem List Patient Active Problem List   Diagnosis Date Noted   Low back pain 11/07/2020   Dysuria 10/24/2020   Pain, dental 09/29/2020   Hypercholesterolemia 04/08/2020   Left knee pain 04/08/2020   COVID-19 virus infection 10/31/2019   Stress 09/25/2019   Joint stiffness of hand 06/26/2018   Plantar fasciitis 06/26/2018   Left leg pain 02/21/2018   Hyperglycemia 02/21/2018   Visual disturbance 02/20/2016   Near syncope 02/20/2016   Right lower  quadrant pain 08/04/2015   Abnormal liver function tests 08/04/2015   UTI symptoms 03/31/2015   Health care maintenance 02/03/2015   Cough 12/21/2014   History of colonic polyps 05/29/2014   Leukopenia 07/07/2013   Asthma 10/10/2012   Allergic rhinitis 10/10/2012   GERD (gastroesophageal reflux disease) 10/10/2012   Hypertension 10/10/2012    Everlean Alstrom. Graylon Good, PT, DPT 05/30/21, 5:48 PM   [facility] [address], [place], [address] Phone: [phone]   Fax:  [phone]  Name: Chloe Moyer MRN: [id number] Date of Birth: 25-Oct-1951

## 2021-06-04 ENCOUNTER — Encounter: Payer: PPO | Admitting: Physical Therapy

## 2021-06-06 ENCOUNTER — Encounter: Payer: PPO | Admitting: Physical Therapy

## 2021-06-11 ENCOUNTER — Ambulatory Visit: Payer: PPO | Admitting: Physical Therapy

## 2021-06-12 ENCOUNTER — Ambulatory Visit: Payer: PPO | Admitting: Physical Therapy

## 2021-06-12 ENCOUNTER — Encounter: Payer: Self-pay | Admitting: Physical Therapy

## 2021-06-12 DIAGNOSIS — G8929 Other chronic pain: Secondary | ICD-10-CM

## 2021-06-12 DIAGNOSIS — M25562 Pain in left knee: Secondary | ICD-10-CM

## 2021-06-12 DIAGNOSIS — R262 Difficulty in walking, not elsewhere classified: Secondary | ICD-10-CM

## 2021-06-12 DIAGNOSIS — M6281 Muscle weakness (generalized): Secondary | ICD-10-CM

## 2021-06-12 DIAGNOSIS — M25561 Pain in right knee: Secondary | ICD-10-CM

## 2021-06-12 NOTE — Therapy (Signed)
[facility] [address] [place], [address] Phone: [phone]   Fax:  [phone]  Physical Therapy Treatment  Patient Details  Name: Chloe Moyer MRN: [id number] Date of Birth: [birth year] Referring Provider (PT): Einar Pheasant, MD   Encounter Date: 06/12/2021   PT End of Session - 06/12/21 0907     Visit Number 15    Number of Visits 24    Date for PT Re-Evaluation 06/25/21    Authorization Type [employer] reporting period from 05/07/2021    Progress Note Due on Visit 20    PT Start Time 0900    PT Stop Time 0940    PT Time Calculation (min) 40 min    Activity Tolerance Patient tolerated treatment well    Behavior During Therapy [facility] for tasks assessed/performed             Past Medical History:  Diagnosis Date   Allergic rhinitis    Arthritis    Asthma    Diverticulosis, sigmoid    GERD (gastroesophageal reflux disease)    History of hiatal hernia    Hypertension 06-12-2005   Ulcer     Past Surgical History:  Procedure Laterality Date   BREAST BIOPSY Right 09/01/2017   Affirm Bx of 2 areas- both areas were fibroadenomatoid change    BREAST CYST ASPIRATION Left 1990   CARDIAC CATHETERIZATION  July 2012   CHOLECYSTECTOMY  1991   LIPOMA EXCISION  4/99   Left flank area   NASAL SINUS SURGERY  July 2012   TUBAL LIGATION  1984    There were no vitals filed for this visit.   Subjective Assessment - 06/12/21 0905     Subjective Patient reports she is feeling pretty good today. She reports 1/10 pain in the medial left knee at the joint line. She was at the beach for several days and her knees hurt pretty bad some days and not so much other days. Stairs still bother her. Had one bad day for her back but did her standing extension to help. Did her HEP most days and was recording it on the log-in but then went back and could not find anything. Would like HEP sent to her again.     Pertinent History Patient is a 70 y.o. female who presents to outpatient physical therapy with a referral for medical diagnosispain in both knees, unspecified chronicity. This patient's chief complaints chronic bilateral knee pain, L > R, leading to the following functional deficits:  Difficulty walking, running, playing on beach with grandson, athletic activities, stairs, sleeping, lower extremity dressing, weight bearing activities, jumping, etc, and decreased quality of life.  Relevant past medical history and comorbidities include low back pain with referral to right thigh, chronic allergic asthma (epi pen in car), acute sinusitis (two surgeries), skin spot removed, hypertension, dental issues, reflux, hx of cardiac catheterization to prove she doesn't have CHF (heart was healthy), cholecystectomy, osteoarthritis, hx L knee pain (baker's cyst/meniscus). Patient denies hx of cancer, stroke, seizures, lung problem (besides asthma), major cardiac events, diabetes, unexplained weight loss, changes in bowel or bladder problems, new onset stumbling or dropping things, low back surgeryor bladder problems, new onset stumbling or dropping things, low back surgery, osteoporosis.    Limitations Lifting;Walking;House hold activities;Other (comment);Standing    Diagnostic tests L MRI report 11/14/2018: "IMPRESSION:  1. Radial tear of the medial meniscus posterior horn.  2. Subchondral insufficiency fracture of the medial tibial plateau.  3. Mild tricompartmental osteoarthritis.  4. Small joint effusion and Baker cyst."    Patient Stated Goals "I just need to strengthen both knees"    Currently in Pain? Yes    Pain Score 1     Pain Onset More than a month ago            OBJECTIVE FOTO = 61 (05/30/2021)   TTREATMENT:    Manual therapy: to reduce pain and tissue tension, improve range of motion, neuromodulation, in order to promote improved ability to complete functional activities. PRONE - STM to right  lumbar paraspinals.   Modality: (unbilled) Dry needling performed to right lumbar region to decrease pain and spasms along patient's right low back region with patient in prone utilizing (2) dry needle(s) .47m x 755mwith (4) sticks at R lumbar multifidi ~ L5-S1. Patient educated about the risks and benefits from therapy and verbally consents to treatment.  Dry needling performed by SaEverlean AlstromSnGraylon GoodT, DPT who is certified in this technique.   Therapeutic exercise: to centralize symptoms and improve ROM, strength, muscular endurance, and activity tolerance required for successful completion of functional activities.  - prone press up 1x20 - Total gym squats, slow tempo, level 26, 1x32 - total gym single leg squat, slow tempo, 3x10 each side, level 16 (required modifications for left side to mitigate pain) - side stepping with black theraband around distal thighs, 2x35 each side. - forward and backwards monster walks with black theraband around distal thighs, 1x35 feet each direction.  - provided black theraband   Pt required multimodal cuing for proper technique and to facilitate improved neuromuscular control, strength, range of motion, and functional ability resulting in improved performance and form.     HOME EXERCISE PROGRAM Access Code: [email or web address]: [email or web address] Date: 05/30/2021 Prepared by: SaRosita Kea Exercises Bridge with Hip Abduction and Resistance - Ground Touches - 1 sets - 20 reps - 3 seconds hold Wall Sit - 1 x daily - 1 sets - 20 reps - 5 seconds hold Sitting Knee Extension with Resistance - 1 x daily - 1-3 sets - 10 reps - 3 seconds hold Hip Hikes off step - 1 x daily - 2-3 sets - 10 reps Forward Backward Monster Walk with Band at Thighs and Counter Support - 1 x daily - 1-3 sets - 40 reps Side Stepping with Resistance at Thighs - 1 x daily - 1-3 sets - 40 reps    PT Education - 06/12/21 0907     Education Details Exercise purpose/form. Self  management techniques.    Person(s) Educated Patient    Methods Explanation;Demonstration;Tactile cues;Verbal cues    Comprehension Verbalized understanding;Returned demonstration;Verbal cues required;Tactile cues required;Need further instruction              PT Short Term Goals - 04/02/21 1735       PT SHORT TERM GOAL #1   Title Be independent with initial home exercise program for self-management of symptoms.    Baseline Initial HEP provided at IE (04/02/2021);    Time 2    Period Weeks    Status Achieved    Target Date 04/16/21               PT Long Term Goals - 05/07/21 1002       PT LONG TERM GOAL #1   Title Be independent with a long-term home exercise program for self-management of symptoms.    Baseline Initial HEP provided at IE (04/02/2021); currently  participating in appropriate HEP (05/07/2021);    Time 12    Period Weeks    Status Partially Met   TARGET DATE FOR ALL LONG TERM GOALS: 06/25/2021     PT LONG TERM GOAL #2   Title Demonstrate improved FOTO score to equal or greater than 63 by visit #12 to demonstrate improvement in overall condition and self-reported functional ability.    Baseline 59 (04/02/2021); 65 (04/25/2021); 59 (05/07/21);    Time 12    Period Weeks    Status On-going      PT LONG TERM GOAL #3   Title Improve B hip and knee strength to 4+/5 with no increase in pain to improve patient's ability to complete  valued functional tasks such hiking, stairs, running with less difficulty.    Baseline painful and weak - see objective exam (04/02/2021); improved pain and strength - see objective exam (05/07/2021);    Time 12    Period Weeks    Status New      PT LONG TERM GOAL #4   Title Reduce pain with functional activities to equal or less than 1/10 to allow patient to complete usual activities including ADLs, IADLs, and social engagement with less difficulty.    Baseline 8/10 (04/02/2021); up to 4/10 in the last 2 weeks (05/07/21);    Time 12     Period Weeks    Status Partially Met      PT LONG TERM GOAL #5   Title Complete community, work and/or recreational activities without limitation due to current condition.    Baseline Functional Limitations: walking, running, playing on beach with grandson, athletic activities, stairs, sleeping, lower extremity dressing, weight bearing activities, jumping (04/02/2021); improved overall but continues to have pain and difficulty with stairs, lifting, bending, moving furnature and item( 05/07/21);    Time 12    Period Weeks    Status Partially Met                   Plan - 06/12/21 0950     Clinical Impression Statement Patient tolerated treatment well overall. Returned to similar exercises as last PT session for quad strengthening and required some modifications for the left knee due to pain. Able to progress banded walking exercises to stiffer band. Continued dry needling to low back to help mitigate pain there to allow her to continue with knee focused exercises. Patient would benefit from continued management of limiting condition by skilled physical therapist to address remaining impairments and functional limitations to work towards stated goals and return to PLOF or maximal functional independence.    Personal Factors and Comorbidities Age;Comorbidity 3+;Time since onset of injury/illness/exacerbation;Past/Current Experience    Comorbidities Relevant past medical history and comorbidities include low back pain with referral to right thigh, chronic allergic asthma (epi pen in car), acute sinusitis (two surgeries), skin spot removed, hypertension, dental issues, reflux, hx of cardiac catheterization to prove she doesn't have CHF (heart was healthy), cholecystectomy, osteoarthritis, hx L knee pain (baker's cyst/meniscus).    Examination-Activity Limitations Locomotion Level;Transfers;Stand;Carry;Squat;Stairs;Dressing;Sleep;Lift    Examination-Participation Restrictions Community  Activity;Interpersonal Relationship;Laundry;Shop;Yard Work   Difficulty walking, running, playing on beach with grandson, athletic activities, stairs, sleeping, lower extremity dressing, weight bearing activities, jumping, etc, and decreased quality of life.   Stability/Clinical Decision Making Evolving/Moderate complexity    Rehab Potential Good    PT Frequency 2x / week    PT Duration 12 weeks    PT Treatment/Interventions ADLs/Self Care Home Management;Aquatic Therapy;Cryotherapy;Moist Heat;Electrical Stimulation;Therapeutic  activities;Gait training;Therapeutic exercise;Neuromuscular re-education;Patient/family education;Passive range of motion;Dry needling;Spinal Manipulations;Joint Manipulations;Manual techniques    PT Next Visit Plan update HEP as appropriate, B LE and functional strengthening/balance as tolerated    PT Home Exercise Plan Medbridge  Access Code: [email or web address]    Consulted and Agree with Plan of Care Patient             Patient will benefit from skilled therapeutic intervention in order to improve the following deficits and impairments:  Pain, Decreased coordination, Decreased mobility, Increased muscle spasms, Postural dysfunction, Decreased activity tolerance, Decreased endurance, Decreased range of motion, Decreased strength, Hypomobility, Impaired perceived functional ability, Difficulty walking, Abnormal gait, Improper body mechanics, Impaired flexibility, Decreased balance  Visit Diagnosis: Chronic pain of left knee  Chronic pain of right knee  Difficulty in walking, not elsewhere classified  Muscle weakness (generalized)     Problem List Patient Active Problem List   Diagnosis Date Noted   Low back pain 11/07/2020   Dysuria 10/24/2020   Pain, dental 09/29/2020   Hypercholesterolemia 04/08/2020   Left knee pain 04/08/2020   COVID-19 virus infection 10/31/2019   Stress 09/25/2019   Joint stiffness of hand 06/26/2018   Plantar fasciitis 06/26/2018   Left  leg pain 02/21/2018   Hyperglycemia 02/21/2018   Visual disturbance 02/20/2016   Near syncope 02/20/2016   Right lower quadrant pain 08/04/2015   Abnormal liver function tests 08/04/2015   UTI symptoms 03/31/2015   Health care maintenance 02/03/2015   Cough 12/21/2014   History of colonic polyps 05/29/2014   Leukopenia 07/07/2013   Asthma 10/10/2012   Allergic rhinitis 10/10/2012   GERD (gastroesophageal reflux disease) 10/10/2012   Hypertension 10/10/2012    Everlean Alstrom. Graylon Good, PT, DPT 06/12/21, 9:50 AM  [facility] [address] [place], [address] Phone: [phone]   Fax:  [phone]  Name: Chloe Moyer MRN: [id number] Date of Birth: [birth year]

## 2021-06-13 ENCOUNTER — Encounter: Payer: PPO | Admitting: Physical Therapy

## 2021-06-19 ENCOUNTER — Ambulatory Visit: Payer: PPO | Attending: Internal Medicine | Admitting: Physical Therapy

## 2021-06-19 ENCOUNTER — Encounter: Payer: Self-pay | Admitting: Physical Therapy

## 2021-06-19 DIAGNOSIS — R262 Difficulty in walking, not elsewhere classified: Secondary | ICD-10-CM | POA: Diagnosis not present

## 2021-06-19 DIAGNOSIS — M25562 Pain in left knee: Secondary | ICD-10-CM | POA: Insufficient documentation

## 2021-06-19 DIAGNOSIS — G8929 Other chronic pain: Secondary | ICD-10-CM | POA: Insufficient documentation

## 2021-06-19 DIAGNOSIS — M6281 Muscle weakness (generalized): Secondary | ICD-10-CM | POA: Diagnosis not present

## 2021-06-19 DIAGNOSIS — M25561 Pain in right knee: Secondary | ICD-10-CM | POA: Insufficient documentation

## 2021-06-19 NOTE — Therapy (Signed)
[facility] [address] [place], [address] Phone: [phone]   Fax:  [phone]  Physical Therapy Treatment  Patient Details  Name: Chloe Moyer MRN: [id number] Date of Birth: [birth year] Referring Provider (PT): Einar Pheasant, MD   Encounter Date: 06/19/2021   PT End of Session - 06/19/21 1135     Visit Number 16    Number of Visits 24    Date for PT Re-Evaluation 06/25/21    Authorization Type [employer] reporting period from 05/07/2021    Progress Note Due on Visit 20    PT Start Time 1115    PT Stop Time 1200    PT Time Calculation (min) 45 min    Activity Tolerance Patient tolerated treatment well    Behavior During Therapy [facility] for tasks assessed/performed             Past Medical History:  Diagnosis Date   Allergic rhinitis    Arthritis    Asthma    Diverticulosis, sigmoid    GERD (gastroesophageal reflux disease)    History of hiatal hernia    Hypertension 06-12-2005   Ulcer     Past Surgical History:  Procedure Laterality Date   BREAST BIOPSY Right 09/01/2017   Affirm Bx of 2 areas- both areas were fibroadenomatoid change    BREAST CYST ASPIRATION Left 1990   CARDIAC CATHETERIZATION  July 2012   CHOLECYSTECTOMY  1991   LIPOMA EXCISION  4/99   Left flank area   NASAL SINUS SURGERY  July 2012   TUBAL LIGATION  1984    There were no vitals filed for this visit.   Subjective Assessment - 06/19/21 1114     Subjective Pateint reports her body is tired today. Her back was "a little catchy" this morning. Her knees have been pretty good. She has had a sinus issue going on (has had problems for years) and thinks she is going to need an antibiotic. She didn't get to go to the beach because her husband had an upset stomach last week. She did go up some stairs yesterday and did pretty good. Reports a little pain in her right low back currently (1/10). Felt okay after last PT  session.    Pertinent History Patient is a 70 y.o. female who presents to outpatient physical therapy with a referral for medical diagnosispain in both knees, unspecified chronicity. This patient's chief complaints chronic bilateral knee pain, L > R, leading to the following functional deficits:  Difficulty walking, running, playing on beach with grandson, athletic activities, stairs, sleeping, lower extremity dressing, weight bearing activities, jumping, etc, and decreased quality of life.  Relevant past medical history and comorbidities include low back pain with referral to right thigh, chronic allergic asthma (epi pen in car), acute sinusitis (two surgeries), skin spot removed, hypertension, dental issues, reflux, hx of cardiac catheterization to prove she doesn't have CHF (heart was healthy), cholecystectomy, osteoarthritis, hx L knee pain (baker's cyst/meniscus). Patient denies hx of cancer, stroke, seizures, lung problem (besides asthma), major cardiac events, diabetes, unexplained weight loss, changes in bowel or bladder problems, new onset stumbling or dropping things, low back surgeryor bladder problems, new onset stumbling or dropping things, low back surgery, osteoporosis.    Limitations Lifting;Walking;House hold activities;Other (comment);Standing    Diagnostic tests L MRI report 11/14/2018: "IMPRESSION:  1. Radial tear of the medial meniscus posterior horn.  2. Subchondral insufficiency fracture of the medial tibial plateau.  3.  Mild tricompartmental osteoarthritis.  4. Small joint effusion and Baker cyst."    Patient Stated Goals "I just need to strengthen both knees"    Currently in Pain? Yes    Pain Score 1     Pain Onset More than a month ago             OBJECTIVE FOTO = 61 (05/30/2021)   TTREATMENT:    Manual therapy: to reduce pain and tissue tension, improve range of motion, neuromodulation, in order to promote improved ability to complete functional activities. PRONE - STM  to right lumbar paraspinals.   Modality: (unbilled) Dry needling performed to right lumbar region to decrease pain and spasms along patient's right low back region with patient in prone utilizing (1) dry needle(s) .56m x 753mwith (2) sticks at R lumbar multifidi ~ L4-L5. Patient educated about the risks and benefits from therapy and verbally consents to treatment.  Dry needling performed by SaEverlean AlstromSnGraylon GoodT, DPT who is certified in this technique.   Therapeutic exercise: to centralize symptoms and improve ROM, strength, muscular endurance, and activity tolerance required for successful completion of functional activities.  - prone press up 1x22 - Total gym squats, slow tempo, level 26, 1x30 - total gym single leg squat, slow tempo, 2x10 each side level 18, 1x10 each side level 16. (Very challenging) - seated hamstring curls at omega machine, 2x10 each side, 15# stack.  - standing lumbar extension over edge of plinth, 1x15 - side stepping with black theraband around distal thighs, 2x35 each side. - forward and backwards monster walks with black theraband around distal thighs, 1x70 feet each direction.    Pt required multimodal cuing for proper technique and to facilitate improved neuromuscular control, strength, range of motion, and functional ability resulting in improved performance and form.     HOME EXERCISE PROGRAM Access Code: [email or web address]: [email or web address] Date: 05/30/2021 Prepared by: SaRosita Kea Exercises Bridge with Hip Abduction and Resistance - Ground Touches - 1 sets - 20 reps - 3 seconds hold Wall Sit - 1 x daily - 1 sets - 20 reps - 5 seconds hold Sitting Knee Extension with Resistance - 1 x daily - 1-3 sets - 10 reps - 3 seconds hold Hip Hikes off step - 1 x daily - 2-3 sets - 10 reps Forward Backward Monster Walk with Band at Thighs and Counter Support - 1 x daily - 1-3 sets - 40 reps Side Stepping with Resistance at Thighs - 1 x daily - 1-3 sets -  40 reps   PT Education - 06/19/21 1138     Education Details Exercise purpose/form. Self management techniques.    Person(s) Educated Patient    Methods Explanation;Demonstration;Tactile cues;Verbal cues    Comprehension Verbalized understanding;Returned demonstration;Verbal cues required;Tactile cues required;Need further instruction              PT Short Term Goals - 04/02/21 1735       PT SHORT TERM GOAL #1   Title Be independent with initial home exercise program for self-management of symptoms.    Baseline Initial HEP provided at IE (04/02/2021);    Time 2    Period Weeks    Status Achieved    Target Date 04/16/21               PT Long Term Goals - 05/07/21 1002       PT LONG TERM GOAL #1   Title Be independent with a long-term home exercise  program for self-management of symptoms.    Baseline Initial HEP provided at IE (04/02/2021); currently participating in appropriate HEP (05/07/2021);    Time 12    Period Weeks    Status Partially Met   TARGET DATE FOR ALL LONG TERM GOALS: 06/25/2021     PT LONG TERM GOAL #2   Title Demonstrate improved FOTO score to equal or greater than 63 by visit #12 to demonstrate improvement in overall condition and self-reported functional ability.    Baseline 59 (04/02/2021); 65 (04/25/2021); 59 (05/07/21);    Time 12    Period Weeks    Status On-going      PT LONG TERM GOAL #3   Title Improve B hip and knee strength to 4+/5 with no increase in pain to improve patient's ability to complete  valued functional tasks such hiking, stairs, running with less difficulty.    Baseline painful and weak - see objective exam (04/02/2021); improved pain and strength - see objective exam (05/07/2021);    Time 12    Period Weeks    Status New      PT LONG TERM GOAL #4   Title Reduce pain with functional activities to equal or less than 1/10 to allow patient to complete usual activities including ADLs, IADLs, and social engagement with less  difficulty.    Baseline 8/10 (04/02/2021); up to 4/10 in the last 2 weeks (05/07/21);    Time 12    Period Weeks    Status Partially Met      PT LONG TERM GOAL #5   Title Complete community, work and/or recreational activities without limitation due to current condition.    Baseline Functional Limitations: walking, running, playing on beach with grandson, athletic activities, stairs, sleeping, lower extremity dressing, weight bearing activities, jumping (04/02/2021); improved overall but continues to have pain and difficulty with stairs, lifting, bending, moving furnature and item( 05/07/21);    Time 12    Period Weeks    Status Partially Met                   Plan - 06/19/21 1137     Clinical Impression Statement Patient tolerated treatment well and was able to progress close chain quad exercise to be near failure at 10 reps with no or minimal discomfort. Also reports improved knee pain duirng ADLs at home. Patient would benefit from continued management of limiting condition by skilled physical therapist to address remaining impairments and functional limitations to work towards stated goals and return to PLOF or maximal functional independence.    Personal Factors and Comorbidities Age;Comorbidity 3+;Time since onset of injury/illness/exacerbation;Past/Current Experience    Comorbidities Relevant past medical history and comorbidities include low back pain with referral to right thigh, chronic allergic asthma (epi pen in car), acute sinusitis (two surgeries), skin spot removed, hypertension, dental issues, reflux, hx of cardiac catheterization to prove she doesn't have CHF (heart was healthy), cholecystectomy, osteoarthritis, hx L knee pain (baker's cyst/meniscus).    Examination-Activity Limitations Locomotion Level;Transfers;Stand;Carry;Squat;Stairs;Dressing;Sleep;Lift    Examination-Participation Restrictions Community Activity;Interpersonal Relationship;Laundry;Shop;Yard Work    Difficulty walking, running, playing on beach with grandson, athletic activities, stairs, sleeping, lower extremity dressing, weight bearing activities, jumping, etc, and decreased quality of life.   Stability/Clinical Decision Making Evolving/Moderate complexity    Rehab Potential Good    PT Frequency 2x / week    PT Duration 12 weeks    PT Treatment/Interventions ADLs/Self Care Home Management;Aquatic Therapy;Cryotherapy;Moist Heat;Electrical Stimulation;Therapeutic activities;Gait training;Therapeutic exercise;Neuromuscular re-education;Patient/family education;Passive range  of motion;Dry needling;Spinal Manipulations;Joint Manipulations;Manual techniques    PT Next Visit Plan update HEP as appropriate, B LE and functional strengthening/balance as tolerated    PT Home Exercise Plan Medbridge  Access Code: [email or web address]    Consulted and Agree with Plan of Care Patient             Patient will benefit from skilled therapeutic intervention in order to improve the following deficits and impairments:  Pain, Decreased coordination, Decreased mobility, Increased muscle spasms, Postural dysfunction, Decreased activity tolerance, Decreased endurance, Decreased range of motion, Decreased strength, Hypomobility, Impaired perceived functional ability, Difficulty walking, Abnormal gait, Improper body mechanics, Impaired flexibility, Decreased balance  Visit Diagnosis: Chronic pain of left knee  Chronic pain of right knee  Difficulty in walking, not elsewhere classified  Muscle weakness (generalized)     Problem List Patient Active Problem List   Diagnosis Date Noted   Low back pain 11/07/2020   Dysuria 10/24/2020   Pain, dental 09/29/2020   Hypercholesterolemia 04/08/2020   Left knee pain 04/08/2020   COVID-19 virus infection 10/31/2019   Stress 09/25/2019   Joint stiffness of hand 06/26/2018   Plantar fasciitis 06/26/2018   Left leg pain 02/21/2018   Hyperglycemia 02/21/2018   Visual  disturbance 02/20/2016   Near syncope 02/20/2016   Right lower quadrant pain 08/04/2015   Abnormal liver function tests 08/04/2015   UTI symptoms 03/31/2015   Health care maintenance 02/03/2015   Cough 12/21/2014   History of colonic polyps 05/29/2014   Leukopenia 07/07/2013   Asthma 10/10/2012   Allergic rhinitis 10/10/2012   GERD (gastroesophageal reflux disease) 10/10/2012   Hypertension 10/10/2012    Everlean Alstrom. Graylon Good, PT, DPT 06/19/21, 12:02 PM   [facility] [address] [place], [address] Phone: [phone]   Fax:  [phone]  Name: Chloe Moyer MRN: [id number] Date of Birth: [birth year]

## 2021-06-24 ENCOUNTER — Ambulatory Visit: Payer: PPO | Admitting: Physical Therapy

## 2021-06-24 ENCOUNTER — Encounter: Payer: Self-pay | Admitting: Physical Therapy

## 2021-06-24 DIAGNOSIS — M25561 Pain in right knee: Secondary | ICD-10-CM

## 2021-06-24 DIAGNOSIS — M25562 Pain in left knee: Secondary | ICD-10-CM | POA: Diagnosis not present

## 2021-06-24 DIAGNOSIS — G8929 Other chronic pain: Secondary | ICD-10-CM

## 2021-06-24 DIAGNOSIS — R262 Difficulty in walking, not elsewhere classified: Secondary | ICD-10-CM

## 2021-06-24 DIAGNOSIS — M6281 Muscle weakness (generalized): Secondary | ICD-10-CM

## 2021-06-24 NOTE — Therapy (Signed)
[facility] [address], [place], [address] Phone: [phone]   Fax:  [phone]  Physical Therapy Treatment / Progress Note / Re-Certification Dates of reporting: 05/07/2021 - 06/24/2021  Patient Details  Name: Chloe Moyer MRN: [id number] Date of Birth: 05/11/1951 Referring Provider (PT): Einar Pheasant, MD   Encounter Date: 06/24/2021   PT End of Session - 06/24/21 1507     Visit Number 17    Number of Visits 40    Date for PT Re-Evaluation 09/16/21    Authorization Type [employer] reporting period from 05/07/2021    Progress Note Due on Visit 20    PT Start Time 1437    PT Stop Time 9678    PT Time Calculation (min) 38 min    Activity Tolerance Patient tolerated treatment well    Behavior During Therapy [facility] for tasks assessed/performed             Past Medical History:  Diagnosis Date   Allergic rhinitis    Arthritis    Asthma    Diverticulosis, sigmoid    GERD (gastroesophageal reflux disease)    History of hiatal hernia    Hypertension 06-12-2005   Ulcer     Past Surgical History:  Procedure Laterality Date   BREAST BIOPSY Right 09/01/2017   Affirm Bx of 2 areas- both areas were fibroadenomatoid change    BREAST CYST ASPIRATION Left 1990   CARDIAC CATHETERIZATION  July 2012   CHOLECYSTECTOMY  1991   LIPOMA EXCISION  4/99   Left flank area   NASAL SINUS SURGERY  July 2012   TUBAL LIGATION  1984    There were no vitals filed for this visit.   Subjective Assessment - 06/24/21 1442     Subjective Patient reports she feels like PT has really helped her. She had a lot of muscle soreness in her her quads, hamstrings, and glutes following last PT session and had some pain and catching in her R knee for a day in her R knee. States she was able to walk around church and make ice cream really well. She would like to have a break from PT but doesn't feel she has reached her  maximal improvement. She currnetly has a little pain in the medial R knee  (1/10). She finds it easier to get down to the floor and walking is much improved. Stairs are easier but still pretty difficult. As her legs get stronger she is seeing improvement.    Pertinent History Patient is a 70 y.o. female who presents to outpatient physical therapy with a referral for medical diagnosispain in both knees, unspecified chronicity. This patient's chief complaints chronic bilateral knee pain, L > R, leading to the following functional deficits:  Difficulty walking, running, playing on beach with grandson, athletic activities, stairs, sleeping, lower extremity dressing, weight bearing activities, jumping, etc, and decreased quality of life.  Relevant past medical history and comorbidities include low back pain with referral to right thigh, chronic allergic asthma (epi pen in car), acute sinusitis (two surgeries), skin spot removed, hypertension, dental issues, reflux, hx of cardiac catheterization to prove she doesn't have CHF (heart was healthy), cholecystectomy, osteoarthritis, hx L knee pain (baker's cyst/meniscus). Patient denies hx of cancer, stroke, seizures, lung problem (besides asthma), major cardiac events, diabetes, unexplained weight loss, changes in bowel or bladder problems, new onset stumbling or dropping things, low back surgeryor bladder problems, new onset stumbling or dropping things,  low back surgery, osteoporosis.    Limitations Lifting;Walking;House hold activities;Other (comment);Standing    Diagnostic tests L MRI report 11/14/2018: "IMPRESSION:  1. Radial tear of the medial meniscus posterior horn.  2. Subchondral insufficiency fracture of the medial tibial plateau.  3. Mild tricompartmental osteoarthritis.  4. Small joint effusion and Baker cyst."    Patient Stated Goals "I just need to strengthen both knees"    Currently in Pain? Yes    Pain Score 1     Pain Onset More than a month ago             OBJECTIVE FOTO = 67 (06/24/2021)   MUSCLE PERFORMANCE (MMT): *Indicates pain 04/02/21 05/07/21 06/24/21  Joint/Motion R/L R/L R/L  Hip        Flexion 4/5 4/4+ 4+/4+  Extension (knee ext) 4/4+ 4+/4+ 4+/4+  Extension (knee flex) / 4/4 4/4  Abduction 4-/4 4+/4+ 5/4+  Adduction 3/3 3+/4 4/4  External rotation 4+/4 4+/4 5/5  Internal rotation  4/4 4+/4+ 4+/4+*  Knee        Extension 4+*/4+* 4+*/5 5/5*  Flexion 4+*/4* 4+/4+ 5/5  Comments:  04/02/21: B ankles WFL.R knee extension: pain over patella. L knee extension: pain at medial joint line.  R knee flexion: pain over patella, L knee flexion: pain at posterior knee. R hip IR: pain at medial knee. L hip IR/ER: pain at medial joint line. R hip flexion: pain at medial knee. 05/07/21: Pain at medial knees.  06/24/2021: pain at medial knee.   TREATMENT:    Therapeutic exercise: to centralize symptoms and improve ROM, strength, muscular endurance, and activity tolerance required for successful completion of functional activities.  - examination to assess progress (see above) - seated hamstring curls at omega machine, 2x10 each side, 15# stack. - standing lumbar extension over edge of plinth, 1x15   Pt required multimodal cuing for proper technique and to facilitate improved neuromuscular control, strength, range of motion, and functional ability resulting in improved performance and form.     HOME EXERCISE PROGRAM Access Code: [email or web address] URL: [email or web address] Date: 05/30/2021 Prepared by: Rosita Kea   Exercises Bridge with Hip Abduction and Resistance - Ground Touches - 1 sets - 20 reps - 3 seconds hold Wall Sit - 1 x daily - 1 sets - 20 reps - 5 seconds hold Sitting Knee Extension with Resistance - 1 x daily - 1-3 sets - 10 reps - 3 seconds hold Hip Hikes off step - 1 x daily - 2-3 sets - 10 reps Forward Backward Monster Walk with Band at Thighs and Counter Support - 1 x daily - 1-3 sets - 40 reps Side Stepping  with Resistance at Thighs - 1 x daily - 1-3 sets - 40 reps    PT Education - 06/24/21 1528     Education Details Exercise purpose/form. Self management techniques. POC, progress .    Person(s) Educated Patient    Methods Explanation;Demonstration;Tactile cues;Verbal cues    Comprehension Verbalized understanding;Returned demonstration;Verbal cues required;Tactile cues required;Need further instruction              PT Short Term Goals - 04/02/21 1735       PT SHORT TERM GOAL #1   Title Be independent with initial home exercise program for self-management of symptoms.    Baseline Initial HEP provided at IE (04/02/2021);    Time 2    Period Weeks    Status Achieved    Target Date 04/16/21  PT Long Term Goals - 06/24/21 1448       PT LONG TERM GOAL #1   Title Be independent with a long-term home exercise program for self-management of symptoms.    Baseline Initial HEP provided at IE (04/02/2021); currently participating in appropriate HEP (05/07/2021; 06/24/2021);    Time 12    Period Weeks    Status Partially Met   TARGET DATE FOR ALL LONG TERM GOALS: 06/25/2021; updated to 09/16/2021     PT LONG TERM GOAL #2   Title Demonstrate improved FOTO score to equal or greater than 63 by visit #12 to demonstrate improvement in overall condition and self-reported functional ability.    Baseline 59 (04/02/2021); 65 (04/25/2021); 59 (05/07/21); 67 at visit 17# (06/24/2021)    Time 12    Period Weeks    Status Achieved      PT LONG TERM GOAL #3   Title Improve B hip and knee strength to 4+/5 with no increase in pain to improve patient's ability to complete  valued functional tasks such hiking, stairs, running with less difficulty.    Baseline painful and weak - see objective exam (04/02/2021); improved pain and strength - see objective exam (05/07/2021; 06/24/2021);    Time 12    Period Weeks    Status Partially Met      PT LONG TERM GOAL #4   Title Reduce pain with functional  activities to equal or less than 1/10 to allow patient to complete usual activities including ADLs, IADLs, and social engagement with less difficulty.    Baseline 8/10 (04/02/2021); up to 4/10 in the last 2 weeks (05/07/21);  up to 2/10 in the last 2 weeks (06/24/21);    Time 12    Period Weeks    Status Partially Met      PT LONG TERM GOAL #5   Title Complete community, work and/or recreational activities without limitation due to current condition.    Baseline Functional Limitations: walking, running, playing on beach with grandson, athletic activities, stairs, sleeping, lower extremity dressing, weight bearing activities, jumping (04/02/2021); improved overall but continues to have pain and difficulty with stairs, lifting, bending, moving furnature and item( 05/07/21); continues to improve including getting things off the floor and getting up/down fromthe floor, physically holding out to do yardwork and things like that but still has limitations especially with stairs (06/24/2021);    Time 12    Period Weeks    Status Partially Met                   Plan - 06/24/21 1527     Clinical Impression Statement Patient has attended 17 physical therapy visits this episode of care and continues to make progress towards goals. Attendance has been limited over the last month or so because of being out of town frequently. Continues to demonstrate improvements in activity tolerance and LE strength that is allowing her to complete more functional activities with less pain and discomfort. She does continue to be limited by B knee pain in functional activities such as stairs, ability to stay active on her feet for long periods, etc. Patient does not appear to have reached her maximal improvement and would benefit from continued physical therapy. Patient would benefit from continued management of limiting condition by skilled physical therapist to address remaining impairments and functional limitations to work  towards stated goals and return to PLOF or maximal functional independence.    Personal Factors and Comorbidities Age;Comorbidity 3+;Time since onset  of injury/illness/exacerbation;Past/Current Experience    Comorbidities Relevant past medical history and comorbidities include low back pain with referral to right thigh, chronic allergic asthma (epi pen in car), acute sinusitis (two surgeries), skin spot removed, hypertension, dental issues, reflux, hx of cardiac catheterization to prove she doesn't have CHF (heart was healthy), cholecystectomy, osteoarthritis, hx L knee pain (baker's cyst/meniscus).    Examination-Activity Limitations Locomotion Level;Transfers;Stand;Carry;Squat;Stairs;Dressing;Sleep;Lift    Examination-Participation Restrictions Community Activity;Interpersonal Relationship;Laundry;Shop;Yard Work   Difficulty walking, running, playing on beach with grandson, athletic activities, stairs, sleeping, lower extremity dressing, weight bearing activities, jumping, etc, and decreased quality of life.   Stability/Clinical Decision Making Evolving/Moderate complexity    Rehab Potential Good    PT Frequency 2x / week    PT Duration 12 weeks    PT Treatment/Interventions ADLs/Self Care Home Management;Aquatic Therapy;Cryotherapy;Moist Heat;Electrical Stimulation;Therapeutic activities;Gait training;Therapeutic exercise;Neuromuscular re-education;Patient/family education;Passive range of motion;Dry needling;Spinal Manipulations;Joint Manipulations;Manual techniques    PT Next Visit Plan update HEP as appropriate, B LE and functional strengthening/balance as tolerated    PT Home Exercise Plan Medbridge  Access Code: [email or web address]    Consulted and Agree with Plan of Care Patient             Patient will benefit from skilled therapeutic intervention in order to improve the following deficits and impairments:  Pain, Decreased coordination, Decreased mobility, Increased muscle spasms, Postural  dysfunction, Decreased activity tolerance, Decreased endurance, Decreased range of motion, Decreased strength, Hypomobility, Impaired perceived functional ability, Difficulty walking, Abnormal gait, Improper body mechanics, Impaired flexibility, Decreased balance  Visit Diagnosis: Chronic pain of left knee  Chronic pain of right knee  Difficulty in walking, not elsewhere classified  Muscle weakness (generalized)     Problem List Patient Active Problem List   Diagnosis Date Noted   Low back pain 11/07/2020   Dysuria 10/24/2020   Pain, dental 09/29/2020   Hypercholesterolemia 04/08/2020   Left knee pain 04/08/2020   COVID-19 virus infection 10/31/2019   Stress 09/25/2019   Joint stiffness of hand 06/26/2018   Plantar fasciitis 06/26/2018   Left leg pain 02/21/2018   Hyperglycemia 02/21/2018   Visual disturbance 02/20/2016   Near syncope 02/20/2016   Right lower quadrant pain 08/04/2015   Abnormal liver function tests 08/04/2015   UTI symptoms 03/31/2015   Health care maintenance 02/03/2015   Cough 12/21/2014   History of colonic polyps 05/29/2014   Leukopenia 07/07/2013   Asthma 10/10/2012   Allergic rhinitis 10/10/2012   GERD (gastroesophageal reflux disease) 10/10/2012   Hypertension 10/10/2012    Everlean Alstrom. Graylon Good, PT, DPT 06/24/21, 3:30 PM   [facility] [address], [place], [address] Phone: [phone]   Fax:  [phone]  Name: JOSALIN CARNEIRO MRN: [id number] Date of Birth: 04-Aug-1951

## 2021-06-25 ENCOUNTER — Other Ambulatory Visit: Payer: PPO

## 2021-06-26 ENCOUNTER — Encounter: Payer: Self-pay | Admitting: Internal Medicine

## 2021-06-27 ENCOUNTER — Ambulatory Visit: Payer: PPO | Admitting: Internal Medicine

## 2021-07-01 ENCOUNTER — Ambulatory Visit: Payer: PPO | Admitting: Physical Therapy

## 2021-07-01 ENCOUNTER — Encounter: Payer: Self-pay | Admitting: Physical Therapy

## 2021-07-01 ENCOUNTER — Other Ambulatory Visit: Payer: Self-pay | Admitting: Internal Medicine

## 2021-07-01 DIAGNOSIS — R262 Difficulty in walking, not elsewhere classified: Secondary | ICD-10-CM

## 2021-07-01 DIAGNOSIS — M6281 Muscle weakness (generalized): Secondary | ICD-10-CM

## 2021-07-01 DIAGNOSIS — G8929 Other chronic pain: Secondary | ICD-10-CM

## 2021-07-01 DIAGNOSIS — M25562 Pain in left knee: Secondary | ICD-10-CM | POA: Diagnosis not present

## 2021-07-01 NOTE — Therapy (Signed)
[place] [facility] [address] [place], [address] Phone: [phone]   Fax:  [phone]  Physical Therapy Treatment  Patient Details  Name: Chloe Moyer MRN: [id number] Date of Birth: [birth year] Referring Provider (PT): Einar Pheasant, MD   Encounter Date: 07/01/2021   PT End of Session - 07/01/21 1330     Visit Number 18    Number of Visits 40    Date for PT Re-Evaluation 09/16/21    Authorization Type [employer] reporting period from 06/24/2021    Progress Note Due on Visit 30    PT Start Time 1300    PT Stop Time 1340    PT Time Calculation (min) 40 min    Activity Tolerance Patient tolerated treatment well    Behavior During Therapy [facility] for tasks assessed/performed             Past Medical History:  Diagnosis Date   Allergic rhinitis    Arthritis    Asthma    Diverticulosis, sigmoid    GERD (gastroesophageal reflux disease)    History of hiatal hernia    Hypertension 06-12-2005   Ulcer     Past Surgical History:  Procedure Laterality Date   BREAST BIOPSY Right 09/01/2017   Affirm Bx of 2 areas- both areas were fibroadenomatoid change    BREAST CYST ASPIRATION Left 1990   CARDIAC CATHETERIZATION  July 2012   CHOLECYSTECTOMY  1991   LIPOMA EXCISION  4/99   Left flank area   NASAL SINUS SURGERY  July 2012   TUBAL LIGATION  1984    There were no vitals filed for this visit.   Subjective Assessment - 07/01/21 1327     Subjective Patient reports she is doing well overall. States she had pain in the right medial knee that woke her last night but is no longer hurting this morning. Felt okay after last PT session. States her right low back has been bothering her a bit over the past couple of weeks and is currently 1/10. Has been doing her HEP.    Pertinent History Patient is a 70 y.o. female who presents to outpatient physical therapy with a referral for medical diagnosispain in  both knees, unspecified chronicity. This patient's chief complaints chronic bilateral knee pain, L > R, leading to the following functional deficits:  Difficulty walking, running, playing on beach with grandson, athletic activities, stairs, sleeping, lower extremity dressing, weight bearing activities, jumping, etc, and decreased quality of life.  Relevant past medical history and comorbidities include low back pain with referral to right thigh, chronic allergic asthma (epi pen in car), acute sinusitis (two surgeries), skin spot removed, hypertension, dental issues, reflux, hx of cardiac catheterization to prove she doesn't have CHF (heart was healthy), cholecystectomy, osteoarthritis, hx L knee pain (baker's cyst/meniscus). Patient denies hx of cancer, stroke, seizures, lung problem (besides asthma), major cardiac events, diabetes, unexplained weight loss, changes in bowel or bladder problems, new onset stumbling or dropping things, low back surgeryor bladder problems, new onset stumbling or dropping things, low back surgery, osteoporosis.    Limitations Lifting;Walking;House hold activities;Other (comment);Standing    Diagnostic tests L MRI report 11/14/2018: "IMPRESSION:  1. Radial tear of the medial meniscus posterior horn.  2. Subchondral insufficiency fracture of the medial tibial plateau.  3. Mild tricompartmental osteoarthritis.  4. Small joint effusion and Baker cyst."    Patient Stated Goals "I just need to strengthen both knees"    Currently  in Pain? Yes    Pain Score 1     Pain Onset More than a month ago              OBJECTIVE FOTO = 67 (06/24/2021)   TTREATMENT:    Manual therapy: to reduce pain and tissue tension, improve range of motion, neuromodulation, in order to promote improved ability to complete functional activities. PRONE - STM to right lumbar paraspinals.   Modality: (unbilled) Dry needling performed to right lumbar region to decrease pain and spasms along patient's  right low back region with patient in prone utilizing (3) dry needle(s) .85m x 718mwith (3) sticks at R lumbar multifidi ~ L4-L5 and (1) stick with shelf technique at R iliocostalis at approximately L4. Patient educated about the risks and benefits from therapy and verbally consents to treatment.  Dry needling performed by SaEverlean AlstromSnGraylon GoodT, DPT who is certified in this technique.   Therapeutic exercise: to centralize symptoms and improve ROM, strength, muscular endurance, and activity tolerance required for successful completion of functional activities.  - prone press up 1x20 - Total gym squats, slow tempo, level 26, 1x30 - total gym single leg squat, slow tempo, 3x10 each side level 16 (level 18 too hard). - seated hamstring curls at omega machine, 3x10 each side, 15# stack. - standing lumbar extension over edge of plinth, 1x15   Pt required multimodal cuing for proper technique and to facilitate improved neuromuscular control, strength, range of motion, and functional ability resulting in improved performance and form.     HOME EXERCISE PROGRAM Access Code: [email or web address]: [email or web address] Date: 05/30/2021 Prepared by: SaRosita Kea Exercises Bridge with Hip Abduction and Resistance - Ground Touches - 1 sets - 20 reps - 3 seconds hold Wall Sit - 1 x daily - 1 sets - 20 reps - 5 seconds hold Sitting Knee Extension with Resistance - 1 x daily - 1-3 sets - 10 reps - 3 seconds hold Hip Hikes off step - 1 x daily - 2-3 sets - 10 reps Forward Backward Monster Walk with Band at Thighs and Counter Support - 1 x daily - 1-3 sets - 40 reps Side Stepping with Resistance at Thighs - 1 x daily - 1-3 sets - 40 reps    PT Education - 07/01/21 1330     Education Details Exercise purpose/form. Self management techniques.    Person(s) Educated Patient    Methods Explanation;Demonstration;Tactile cues;Verbal cues    Comprehension Verbalized understanding;Returned  demonstration;Verbal cues required;Tactile cues required;Need further instruction              PT Short Term Goals - 04/02/21 1735       PT SHORT TERM GOAL #1   Title Be independent with initial home exercise program for self-management of symptoms.    Baseline Initial HEP provided at IE (04/02/2021);    Time 2    Period Weeks    Status Achieved    Target Date 04/16/21               PT Long Term Goals - 06/24/21 1448       PT LONG TERM GOAL #1   Title Be independent with a long-term home exercise program for self-management of symptoms.    Baseline Initial HEP provided at IE (04/02/2021); currently participating in appropriate HEP (05/07/2021; 06/24/2021);    Time 12    Period Weeks    Status Partially Met   TARGET DATE FOR ALL LONG TERM GOALS:  06/25/2021; updated to 09/16/2021     PT LONG TERM GOAL #2   Title Demonstrate improved FOTO score to equal or greater than 63 by visit #12 to demonstrate improvement in overall condition and self-reported functional ability.    Baseline 59 (04/02/2021); 65 (04/25/2021); 59 (05/07/21); 67 at visit 17# (06/24/2021)    Time 12    Period Weeks    Status Achieved      PT LONG TERM GOAL #3   Title Improve B hip and knee strength to 4+/5 with no increase in pain to improve patient's ability to complete  valued functional tasks such hiking, stairs, running with less difficulty.    Baseline painful and weak - see objective exam (04/02/2021); improved pain and strength - see objective exam (05/07/2021; 06/24/2021);    Time 12    Period Weeks    Status Partially Met      PT LONG TERM GOAL #4   Title Reduce pain with functional activities to equal or less than 1/10 to allow patient to complete usual activities including ADLs, IADLs, and social engagement with less difficulty.    Baseline 8/10 (04/02/2021); up to 4/10 in the last 2 weeks (05/07/21);  up to 2/10 in the last 2 weeks (06/24/21);    Time 12    Period Weeks    Status Partially Met      PT  LONG TERM GOAL #5   Title Complete community, work and/or recreational activities without limitation due to current condition.    Baseline Functional Limitations: walking, running, playing on beach with grandson, athletic activities, stairs, sleeping, lower extremity dressing, weight bearing activities, jumping (04/02/2021); improved overall but continues to have pain and difficulty with stairs, lifting, bending, moving furnature and item( 05/07/21); continues to improve including getting things off the floor and getting up/down fromthe floor, physically holding out to do yardwork and things like that but still has limitations especially with stairs (06/24/2021);    Time 12    Period Weeks    Status Partially Met                   Plan - 07/01/21 1342     Clinical Impression Statement Patient tolerated treatment well overall and returned to targeted strengthening of the muscles around the knee joints as well as interventions to modulate R low back pain to allow patient to participate in knee specific exercises without as much difficulty. Patient would benefit from continued management of limiting condition by skilled physical therapist to address remaining impairments and functional limitations to work towards stated goals and return to PLOF or maximal functional independence.    Personal Factors and Comorbidities Age;Comorbidity 3+;Time since onset of injury/illness/exacerbation;Past/Current Experience    Comorbidities Relevant past medical history and comorbidities include low back pain with referral to right thigh, chronic allergic asthma (epi pen in car), acute sinusitis (two surgeries), skin spot removed, hypertension, dental issues, reflux, hx of cardiac catheterization to prove she doesn't have CHF (heart was healthy), cholecystectomy, osteoarthritis, hx L knee pain (baker's cyst/meniscus).    Examination-Activity Limitations Locomotion  Level;Transfers;Stand;Carry;Squat;Stairs;Dressing;Sleep;Lift    Examination-Participation Restrictions Community Activity;Interpersonal Relationship;Laundry;Shop;Yard Work   Difficulty walking, running, playing on beach with grandson, athletic activities, stairs, sleeping, lower extremity dressing, weight bearing activities, jumping, etc, and decreased quality of life.   Stability/Clinical Decision Making Evolving/Moderate complexity    Rehab Potential Good    PT Frequency 2x / week    PT Duration 12 weeks    PT Treatment/Interventions ADLs/Self  Care Home Management;Aquatic Therapy;Cryotherapy;Moist Heat;Electrical Stimulation;Therapeutic activities;Gait training;Therapeutic exercise;Neuromuscular re-education;Patient/family education;Passive range of motion;Dry needling;Spinal Manipulations;Joint Manipulations;Manual techniques    PT Next Visit Plan update HEP as appropriate, B LE and functional strengthening/balance as tolerated    PT Home Exercise Plan Medbridge  Access Code: [email or web address]    Consulted and Agree with Plan of Care Patient             Patient will benefit from skilled therapeutic intervention in order to improve the following deficits and impairments:  Pain, Decreased coordination, Decreased mobility, Increased muscle spasms, Postural dysfunction, Decreased activity tolerance, Decreased endurance, Decreased range of motion, Decreased strength, Hypomobility, Impaired perceived functional ability, Difficulty walking, Abnormal gait, Improper body mechanics, Impaired flexibility, Decreased balance  Visit Diagnosis: Chronic pain of left knee  Chronic pain of right knee  Difficulty in walking, not elsewhere classified  Muscle weakness (generalized)     Problem List Patient Active Problem List   Diagnosis Date Noted   Low back pain 11/07/2020   Dysuria 10/24/2020   Pain, dental 09/29/2020   Hypercholesterolemia 04/08/2020   Left knee pain 04/08/2020   COVID-19 virus  infection 10/31/2019   Stress 09/25/2019   Joint stiffness of hand 06/26/2018   Plantar fasciitis 06/26/2018   Left leg pain 02/21/2018   Hyperglycemia 02/21/2018   Visual disturbance 02/20/2016   Near syncope 02/20/2016   Right lower quadrant pain 08/04/2015   Abnormal liver function tests 08/04/2015   UTI symptoms 03/31/2015   Health care maintenance 02/03/2015   Cough 12/21/2014   History of colonic polyps 05/29/2014   Leukopenia 07/07/2013   Asthma 10/10/2012   Allergic rhinitis 10/10/2012   GERD (gastroesophageal reflux disease) 10/10/2012   Hypertension 10/10/2012   Everlean Alstrom. Graylon Good, PT, DPT 07/01/21, 1:43 PM   [place] [facility] [address] [place], [address] Phone: [phone]   Fax:  [phone]  Name: Chloe Moyer MRN: [id number] Date of Birth: [birth year]

## 2021-07-03 ENCOUNTER — Encounter: Payer: PPO | Admitting: Physical Therapy

## 2021-07-08 ENCOUNTER — Ambulatory Visit: Payer: PPO | Admitting: Physical Therapy

## 2021-07-08 ENCOUNTER — Encounter: Payer: Self-pay | Admitting: Physical Therapy

## 2021-07-08 DIAGNOSIS — M6281 Muscle weakness (generalized): Secondary | ICD-10-CM

## 2021-07-08 DIAGNOSIS — R262 Difficulty in walking, not elsewhere classified: Secondary | ICD-10-CM

## 2021-07-08 DIAGNOSIS — M25561 Pain in right knee: Secondary | ICD-10-CM

## 2021-07-08 DIAGNOSIS — G8929 Other chronic pain: Secondary | ICD-10-CM

## 2021-07-08 DIAGNOSIS — M25562 Pain in left knee: Secondary | ICD-10-CM | POA: Diagnosis not present

## 2021-07-08 NOTE — Therapy (Signed)
[facility] [address], [place], [address] Phone: [phone]   Fax:  [phone]  Physical Therapy Treatment  Patient Details  Name: Chloe Moyer MRN: [id number] Date of Birth: Aug 19, 1951 Referring Provider (PT): Einar Pheasant, MD   Encounter Date: 07/08/2021   PT End of Session - 07/08/21 1325     Visit Number 19    Number of Visits 40    Date for PT Re-Evaluation 09/16/21    Authorization Type [employer] reporting period from 06/24/2021    Progress Note Due on Visit 13    PT Start Time 1300    PT Stop Time 1340    PT Time Calculation (min) 40 min    Activity Tolerance Patient tolerated treatment well    Behavior During Therapy [facility] for tasks assessed/performed             Past Medical History:  Diagnosis Date   Allergic rhinitis    Arthritis    Asthma    Diverticulosis, sigmoid    GERD (gastroesophageal reflux disease)    History of hiatal hernia    Hypertension 06-12-2005   Ulcer     Past Surgical History:  Procedure Laterality Date   BREAST BIOPSY Right 09/01/2017   Affirm Bx of 2 areas- both areas were fibroadenomatoid change    BREAST CYST ASPIRATION Left 1990   CARDIAC CATHETERIZATION  July 2012   CHOLECYSTECTOMY  1991   LIPOMA EXCISION  4/99   Left flank area   NASAL SINUS SURGERY  July 2012   TUBAL LIGATION  1984    There were no vitals filed for this visit.   Subjective Assessment - 07/08/21 1306     Subjective Patient reports no pain upon arrival. States she could not sleep one night due to L medial joint line pain at the knee after doing her exercies but she felt okay after her exercises on other days. She felt okay after last PT session. she has been doing her HEP. States dry needling really helped her back and she has not had to do back stretches as much this week.    Pertinent History Patient is a 70 y.o. female who presents to outpatient physical therapy  with a referral for medical diagnosispain in both knees, unspecified chronicity. This patient's chief complaints chronic bilateral knee pain, L > R, leading to the following functional deficits:  Difficulty walking, running, playing on beach with grandson, athletic activities, stairs, sleeping, lower extremity dressing, weight bearing activities, jumping, etc, and decreased quality of life.  Relevant past medical history and comorbidities include low back pain with referral to right thigh, chronic allergic asthma (epi pen in car), acute sinusitis (two surgeries), skin spot removed, hypertension, dental issues, reflux, hx of cardiac catheterization to prove she doesn't have CHF (heart was healthy), cholecystectomy, osteoarthritis, hx L knee pain (baker's cyst/meniscus). Patient denies hx of cancer, stroke, seizures, lung problem (besides asthma), major cardiac events, diabetes, unexplained weight loss, changes in bowel or bladder problems, new onset stumbling or dropping things, low back surgeryor bladder problems, new onset stumbling or dropping things, low back surgery, osteoporosis.    Limitations Lifting;Walking;House hold activities;Other (comment);Standing    Diagnostic tests L MRI report 11/14/2018: "IMPRESSION:  1. Radial tear of the medial meniscus posterior horn.  2. Subchondral insufficiency fracture of the medial tibial plateau.  3. Mild tricompartmental osteoarthritis.  4. Small joint effusion and Baker cyst."    Patient Stated Goals "I  just need to strengthen both knees"    Currently in Pain? No/denies    Pain Onset More than a month ago             OBJECTIVE FOTO = 67 (06/24/2021)   TTREATMENT:    Manual therapy: to reduce pain and tissue tension, improve range of motion, neuromodulation, in order to promote improved ability to complete functional activities. PRONE - STM to right lumbar paraspinals.   Modality: (unbilled) Dry needling performed to right lumbar region to decrease  pain and spasms along patient's right low back region with patient in prone utilizing (3) dry needle(s) .80m x 77mwith (3) sticks at R lumbar multifidi ~ L4-L5. Patient educated about the risks and benefits from therapy and verbally consents to treatment.  Dry needling performed by SaEverlean AlstromSnGraylon GoodT, DPT who is certified in this technique.   Therapeutic exercise: to centralize symptoms and improve ROM, strength, muscular endurance, and activity tolerance required for successful completion of functional activities.  - prone press up 1x20 - Total gym squats, slow tempo, level 26, 1x30 - total gym single leg squat, slow tempo, 3x10 each side level 17 (pain non left medial knee).  - seated hamstring curls at omega machine, 3x10 each side, 20# stack. - standing kickstand sink squat with contralateral heel on floor in front, 1x10 each side.  - standing split squat with B UE support on counter 1x1 with left front (discontinued due to pain).  - standing double leg squat with no UE support to tap on low plinth (18.5 inches) 1x10 - discussed updated HEP to include AROM squats with buttocks taps   Pt required multimodal cuing for proper technique and to facilitate improved neuromuscular control, strength, range of motion, and functional ability resulting in improved performance and form.     HOME EXERCISE PROGRAM Access Code: [email or web address]: [email or web address] Date: 05/30/2021 Prepared by: SaRosita Kea Exercises Bridge with Hip Abduction and Resistance - Ground Touches - 1 sets - 20 reps - 3 seconds hold Wall Sit - 1 x daily - 1 sets - 20 reps - 5 seconds hold Sitting Knee Extension with Resistance - 1 x daily - 1-3 sets - 10 reps - 3 seconds hold Hip Hikes off step - 1 x daily - 2-3 sets - 10 reps Forward Backward Monster Walk with Band at Thighs and Counter Support - 1 x daily - 1-3 sets - 40 reps Side Stepping with Resistance at Thighs - 1 x daily - 1-3 sets - 40 reps     PT  Education - 07/08/21 1325     Education Details Exercise purpose/form. Self management techniques.    Person(s) Educated Patient    Methods Explanation;Demonstration;Tactile cues;Verbal cues    Comprehension Verbalized understanding;Returned demonstration;Verbal cues required;Tactile cues required;Need further instruction              PT Short Term Goals - 04/02/21 1735       PT SHORT TERM GOAL #1   Title Be independent with initial home exercise program for self-management of symptoms.    Baseline Initial HEP provided at IE (04/02/2021);    Time 2    Period Weeks    Status Achieved    Target Date 04/16/21               PT Long Term Goals - 06/24/21 1448       PT LONG TERM GOAL #1   Title Be independent with a long-term home exercise  program for self-management of symptoms.    Baseline Initial HEP provided at IE (04/02/2021); currently participating in appropriate HEP (05/07/2021; 06/24/2021);    Time 12    Period Weeks    Status Partially Met   TARGET DATE FOR ALL LONG TERM GOALS: 06/25/2021; updated to 09/16/2021     PT LONG TERM GOAL #2   Title Demonstrate improved FOTO score to equal or greater than 63 by visit #12 to demonstrate improvement in overall condition and self-reported functional ability.    Baseline 59 (04/02/2021); 65 (04/25/2021); 59 (05/07/21); 67 at visit 17# (06/24/2021)    Time 12    Period Weeks    Status Achieved      PT LONG TERM GOAL #3   Title Improve B hip and knee strength to 4+/5 with no increase in pain to improve patient's ability to complete  valued functional tasks such hiking, stairs, running with less difficulty.    Baseline painful and weak - see objective exam (04/02/2021); improved pain and strength - see objective exam (05/07/2021; 06/24/2021);    Time 12    Period Weeks    Status Partially Met      PT LONG TERM GOAL #4   Title Reduce pain with functional activities to equal or less than 1/10 to allow patient to complete usual activities  including ADLs, IADLs, and social engagement with less difficulty.    Baseline 8/10 (04/02/2021); up to 4/10 in the last 2 weeks (05/07/21);  up to 2/10 in the last 2 weeks (06/24/21);    Time 12    Period Weeks    Status Partially Met      PT LONG TERM GOAL #5   Title Complete community, work and/or recreational activities without limitation due to current condition.    Baseline Functional Limitations: walking, running, playing on beach with grandson, athletic activities, stairs, sleeping, lower extremity dressing, weight bearing activities, jumping (04/02/2021); improved overall but continues to have pain and difficulty with stairs, lifting, bending, moving furnature and item( 05/07/21); continues to improve including getting things off the floor and getting up/down fromthe floor, physically holding out to do yardwork and things like that but still has limitations especially with stairs (06/24/2021);    Time 12    Period Weeks    Status Partially Met                   Plan - 07/08/21 1341     Clinical Impression Statement Patient tolerated treatment well overall, but had some difficulty with single leg squats due to L medial knee pain. Continues to work on LE strength for improved functional activity tolerance. Continues to be limited in completion of functional activities including prolonged standing, stairs, walking. Continued with dry needling of the lumbar region to improve ability to engage in knee exercises. Patient would benefit from continued management of limiting condition by skilled physical therapist to address remaining impairments and functional limitations to work towards stated goals and return to PLOF or maximal functional independence.    Personal Factors and Comorbidities Age;Comorbidity 3+;Time since onset of injury/illness/exacerbation;Past/Current Experience    Comorbidities Relevant past medical history and comorbidities include low back pain with referral to right thigh,  chronic allergic asthma (epi pen in car), acute sinusitis (two surgeries), skin spot removed, hypertension, dental issues, reflux, hx of cardiac catheterization to prove she doesn't have CHF (heart was healthy), cholecystectomy, osteoarthritis, hx L knee pain (baker's cyst/meniscus).    Examination-Activity Limitations Locomotion Level;Transfers;Stand;Carry;Squat;Stairs;Dressing;Sleep;Lift  Examination-Participation Restrictions Community Activity;Interpersonal Relationship;Laundry;Shop;Yard Work   Difficulty walking, running, playing on beach with grandson, athletic activities, stairs, sleeping, lower extremity dressing, weight bearing activities, jumping, etc, and decreased quality of life.   Stability/Clinical Decision Making Evolving/Moderate complexity    Rehab Potential Good    PT Frequency 2x / week    PT Duration 12 weeks    PT Treatment/Interventions ADLs/Self Care Home Management;Aquatic Therapy;Cryotherapy;Moist Heat;Electrical Stimulation;Therapeutic activities;Gait training;Therapeutic exercise;Neuromuscular re-education;Patient/family education;Passive range of motion;Dry needling;Spinal Manipulations;Joint Manipulations;Manual techniques    PT Next Visit Plan update HEP as appropriate, B LE and functional strengthening/balance as tolerated    PT Home Exercise Plan Medbridge  Access Code: [email or web address]    Consulted and Agree with Plan of Care Patient             Patient will benefit from skilled therapeutic intervention in order to improve the following deficits and impairments:  Pain, Decreased coordination, Decreased mobility, Increased muscle spasms, Postural dysfunction, Decreased activity tolerance, Decreased endurance, Decreased range of motion, Decreased strength, Hypomobility, Impaired perceived functional ability, Difficulty walking, Abnormal gait, Improper body mechanics, Impaired flexibility, Decreased balance  Visit Diagnosis: Chronic pain of left knee  Chronic pain of  right knee  Difficulty in walking, not elsewhere classified  Muscle weakness (generalized)     Problem List Patient Active Problem List   Diagnosis Date Noted   Low back pain 11/07/2020   Dysuria 10/24/2020   Pain, dental 09/29/2020   Hypercholesterolemia 04/08/2020   Left knee pain 04/08/2020   COVID-19 virus infection 10/31/2019   Stress 09/25/2019   Joint stiffness of hand 06/26/2018   Plantar fasciitis 06/26/2018   Left leg pain 02/21/2018   Hyperglycemia 02/21/2018   Visual disturbance 02/20/2016   Near syncope 02/20/2016   Right lower quadrant pain 08/04/2015   Abnormal liver function tests 08/04/2015   UTI symptoms 03/31/2015   Health care maintenance 02/03/2015   Cough 12/21/2014   History of colonic polyps 05/29/2014   Leukopenia 07/07/2013   Asthma 10/10/2012   Allergic rhinitis 10/10/2012   GERD (gastroesophageal reflux disease) 10/10/2012   Hypertension 10/10/2012   Everlean Alstrom. Graylon Good, PT, DPT 07/08/21, 1:54 PM   [facility] [address], [place], [address] Phone: [phone]   Fax:  [phone]  Name: Chloe Moyer MRN: [id number] Date of Birth: [birth year]

## 2021-07-09 ENCOUNTER — Other Ambulatory Visit: Payer: Self-pay

## 2021-07-09 ENCOUNTER — Other Ambulatory Visit (INDEPENDENT_AMBULATORY_CARE_PROVIDER_SITE_OTHER): Payer: PPO

## 2021-07-09 DIAGNOSIS — E7800 Pure hypercholesterolemia, unspecified: Secondary | ICD-10-CM | POA: Diagnosis not present

## 2021-07-09 DIAGNOSIS — D72819 Decreased white blood cell count, unspecified: Secondary | ICD-10-CM

## 2021-07-09 DIAGNOSIS — R739 Hyperglycemia, unspecified: Secondary | ICD-10-CM

## 2021-07-09 DIAGNOSIS — I10 Essential (primary) hypertension: Secondary | ICD-10-CM

## 2021-07-09 LAB — CBC WITH DIFFERENTIAL/PLATELET
Basophils Absolute: 0 10*3/uL (ref 0.0–0.1)
Basophils Relative: 1.1 % (ref 0.0–3.0)
Eosinophils Absolute: 0.3 10*3/uL (ref 0.0–0.7)
Eosinophils Relative: 8.6 % — ABNORMAL HIGH (ref 0.0–5.0)
HCT: 37.8 % (ref 36.0–46.0)
Hemoglobin: 12.6 g/dL (ref 12.0–15.0)
Lymphocytes Relative: 41.3 % (ref 12.0–46.0)
Lymphs Abs: 1.4 10*3/uL (ref 0.7–4.0)
MCHC: 33.3 g/dL (ref 30.0–36.0)
MCV: 87.7 fl (ref 78.0–100.0)
Monocytes Absolute: 0.4 10*3/uL (ref 0.1–1.0)
Monocytes Relative: 10.9 % (ref 3.0–12.0)
Neutro Abs: 1.3 10*3/uL — ABNORMAL LOW (ref 1.4–7.7)
Neutrophils Relative %: 38.1 % — ABNORMAL LOW (ref 43.0–77.0)
Platelets: 168 10*3/uL (ref 150.0–400.0)
RBC: 4.31 Mil/uL (ref 3.87–5.11)
RDW: 14.5 % (ref 11.5–15.5)
WBC: 3.3 10*3/uL — ABNORMAL LOW (ref 4.0–10.5)

## 2021-07-09 LAB — HEPATIC FUNCTION PANEL
ALT: 22 U/L (ref 0–35)
AST: 30 U/L (ref 0–37)
Albumin: 4.1 g/dL (ref 3.5–5.2)
Alkaline Phosphatase: 51 U/L (ref 39–117)
Bilirubin, Direct: 0.2 mg/dL (ref 0.0–0.3)
Total Bilirubin: 0.8 mg/dL (ref 0.2–1.2)
Total Protein: 7.4 g/dL (ref 6.0–8.3)

## 2021-07-09 LAB — BASIC METABOLIC PANEL
BUN: 13 mg/dL (ref 6–23)
CO2: 25 mEq/L (ref 19–32)
Calcium: 10 mg/dL (ref 8.4–10.5)
Chloride: 104 mEq/L (ref 96–112)
Creatinine, Ser: 0.74 mg/dL (ref 0.40–1.20)
GFR: 81.22 mL/min (ref >60.00)
Glucose, Bld: 85 mg/dL (ref 70–99)
Potassium: 3.7 mEq/L (ref 3.5–5.1)
Sodium: 137 mEq/L (ref 135–145)

## 2021-07-09 LAB — LIPID PANEL
Cholesterol: 130 mg/dL (ref 0–200)
HDL: 42.7 mg/dL (ref >39.00)
LDL Cholesterol: 73 mg/dL (ref 0–99)
NonHDL: 87.76
Total CHOL/HDL Ratio: 3
Triglycerides: 75 mg/dL (ref 0.0–149.0)
VLDL: 15 mg/dL (ref 0.0–40.0)

## 2021-07-09 LAB — TSH: TSH: 0.1 u[IU]/mL — ABNORMAL LOW (ref 0.35–5.50)

## 2021-07-09 LAB — HEMOGLOBIN A1C: Hgb A1c MFr Bld: 5.7 % (ref 4.6–6.5)

## 2021-07-10 ENCOUNTER — Other Ambulatory Visit: Payer: Self-pay | Admitting: Internal Medicine

## 2021-07-10 ENCOUNTER — Ambulatory Visit: Payer: PPO | Admitting: Physical Therapy

## 2021-07-10 ENCOUNTER — Encounter: Payer: Self-pay | Admitting: Physical Therapy

## 2021-07-10 DIAGNOSIS — M6281 Muscle weakness (generalized): Secondary | ICD-10-CM

## 2021-07-10 DIAGNOSIS — M25562 Pain in left knee: Secondary | ICD-10-CM

## 2021-07-10 DIAGNOSIS — R7989 Other specified abnormal findings of blood chemistry: Secondary | ICD-10-CM

## 2021-07-10 DIAGNOSIS — R262 Difficulty in walking, not elsewhere classified: Secondary | ICD-10-CM

## 2021-07-10 DIAGNOSIS — G8929 Other chronic pain: Secondary | ICD-10-CM

## 2021-07-10 NOTE — Therapy (Signed)
[facility] [address] [place], [address] Phone: [phone]   Fax:  [phone]  Physical Therapy Treatment / Discharge Summary Dates of reporting: 04/02/2021 - 07/10/2021  Patient Details  Name: Chloe Moyer MRN: [id number] Date of Birth: 02-07-1951 Referring Provider (PT): Einar Pheasant, MD   Encounter Date: 07/10/2021   PT End of Session - 07/10/21 1451     Visit Number 20    Number of Visits 40    Date for PT Re-Evaluation 09/16/21    Authorization Type [employer] reporting period from 06/24/2021    Progress Note Due on Visit 30    PT Start Time 1345    PT Stop Time 1430    PT Time Calculation (min) 45 min    Activity Tolerance Patient tolerated treatment well    Behavior During Therapy [facility] for tasks assessed/performed             Past Medical History:  Diagnosis Date   Allergic rhinitis    Arthritis    Asthma    Diverticulosis, sigmoid    GERD (gastroesophageal reflux disease)    History of hiatal hernia    Hypertension 06-12-2005   Ulcer     Past Surgical History:  Procedure Laterality Date   BREAST BIOPSY Right 09/01/2017   Affirm Bx of 2 areas- both areas were fibroadenomatoid change    BREAST CYST ASPIRATION Left 1990   CARDIAC CATHETERIZATION  July 2012   CHOLECYSTECTOMY  1991   LIPOMA EXCISION  4/99   Left flank area   NASAL SINUS SURGERY  July 2012   TUBAL LIGATION  1984    There were no vitals filed for this visit.   Subjective Assessment - 07/10/21 1347     Subjective Patient reports she feels that she overdid it last PT session. Her knees feel "stiff" today and were too painful yesterday to continue her HEP. Today her knees are feeling better today. The pain she had was over the patellar tendon on the right side. She would like to discharge from PT today to independent management and return to PT if she needs to in the future.    Pertinent History  Patient is a 70 y.o. female who presents to outpatient physical therapy with a referral for medical diagnosispain in both knees, unspecified chronicity. This patient's chief complaints chronic bilateral knee pain, L > R, leading to the following functional deficits:  Difficulty walking, running, playing on beach with grandson, athletic activities, stairs, sleeping, lower extremity dressing, weight bearing activities, jumping, etc, and decreased quality of life.  Relevant past medical history and comorbidities include low back pain with referral to right thigh, chronic allergic asthma (epi pen in car), acute sinusitis (two surgeries), skin spot removed, hypertension, dental issues, reflux, hx of cardiac catheterization to prove she doesn't have CHF (heart was healthy), cholecystectomy, osteoarthritis, hx L knee pain (baker's cyst/meniscus). Patient denies hx of cancer, stroke, seizures, lung problem (besides asthma), major cardiac events, diabetes, unexplained weight loss, changes in bowel or bladder problems, new onset stumbling or dropping things, low back surgeryor bladder problems, new onset stumbling or dropping things, low back surgery, osteoporosis.    Limitations Lifting;Walking;House hold activities;Other (comment);Standing    Diagnostic tests L MRI report 11/14/2018: "IMPRESSION:  1. Radial tear of the medial meniscus posterior horn.  2. Subchondral insufficiency fracture of the medial tibial plateau.  3. Mild tricompartmental osteoarthritis.  4. Small joint effusion and Baker cyst."  Patient Stated Goals "I just need to strengthen both knees"    Currently in Pain? No/denies    Pain Onset More than a month ago    Effect of Pain on Daily Activities Functional Limitations:  athletic activities and stairs tend to bother her.             OBJECTIVE FOTO = 65 (07/10/2021)   TREATMENT:     Therapeutic exercise: to centralize symptoms and improve ROM, strength, muscular endurance, and activity  tolerance required for successful completion of functional activities.  - Review of HEP with organization of exercises into two groups of exercises for every other day performance. Patient education with patient input. Included practicing 3x10 squats with B UE support, 3x10 heel raises with UE support, 1x10 each side kickstand squat to elevated plinth + extra reps for practice, seated long arc quad L LE with blue theraband looped around ankle, hooklying crunches 1x10, hooklying modified dead bug, reverse curl position, and full dead bug with B LE only x 5 reps each side. Educated on discharge recommendations with advice on how to modify HEP when necessary. Provided handout.    Pt required multimodal cuing for proper technique and to facilitate improved neuromuscular control, strength, range of motion, and functional ability resulting in improved performance and form.     HOME EXERCISE PROGRAM Access Code: [email or web address] URL: [email or web address] Date: 07/10/2021 Prepared by: Rosita Kea  Exercises Day 1: Squat with Counter Support - 3 x weekly - 3 sets - 10 reps Single leg box squat - 3 x weekly - 3 sets - 10 reps Hip Hikes off step - 3 x weekly - 3 sets - 10 reps  Day 2: Sitting Knee Extension with Resistance - 3 x weekly - 3 sets - 10 reps - 3 seconds hold Forward Backward Monster Walk with Band at Thighs and Counter Support - 3 x weekly - 3 sets - 40 reps Side Stepping with Resistance at Thighs - 3 x weekly - 3 sets - 40 reps  Choose one:  Supine Dead Bug with Leg Extension - 3 x weekly - 3 sets - 10 reps Supine Dead Bug with Leg Extension - 3 x weekly - 3 sets - 10 reps  [email or web address] [id number] [[id number]]  Kickstand Squats -  Repeat 10 Times, Complete 3 Sets, Perform 1 Times a Day (to be used in place of single leg box squat).    PT Education - 07/10/21 1351     Education Details Exercise purpose/form. Self management techniques.    Person(s) Educated Patient    Methods  Explanation;Demonstration;Tactile cues    Comprehension Verbalized understanding;Returned demonstration;Verbal cues required;Tactile cues required;Need further instruction              PT Short Term Goals - 04/02/21 1735       PT SHORT TERM GOAL #1   Title Be independent with initial home exercise program for self-management of symptoms.    Baseline Initial HEP provided at IE (04/02/2021);    Time 2    Period Weeks    Status Achieved    Target Date 04/16/21               PT Long Term Goals - 07/10/21 1453       PT LONG TERM GOAL #1   Title Be independent with a long-term home exercise program for self-management of symptoms.    Baseline Initial HEP provided at IE (04/02/2021); currently participating in appropriate HEP (05/07/2021; 06/24/2021); Appropriate long term  HEP provided and patient has been participating (07/10/2021);    Time 12    Period Weeks    Status Achieved   TARGET DATE FOR ALL LONG TERM GOALS: 06/25/2021; updated to 09/16/2021     PT LONG TERM GOAL #2   Title Demonstrate improved FOTO score to equal or greater than 63 by visit #12 to demonstrate improvement in overall condition and self-reported functional ability.    Baseline 59 (04/02/2021); 65 (04/25/2021); 59 (05/07/21); 67 at visit #17 (06/24/2021); 65 at visit #20 (06/20/2021);    Time 12    Period Weeks    Status Partially Met      PT LONG TERM GOAL #3   Title Improve B hip and knee strength to 4+/5 with no increase in pain to improve patient's ability to complete  valued functional tasks such hiking, stairs, running with less difficulty.    Baseline painful and weak - see objective exam (04/02/2021); improved pain and strength - see objective exam (05/07/2021; 06/24/2021);    Time 12    Period Weeks    Status Partially Met      PT LONG TERM GOAL #4   Title Reduce pain with functional activities to equal or less than 1/10 to allow patient to complete usual activities including ADLs, IADLs, and social engagement  with less difficulty.    Baseline 8/10 (04/02/2021); up to 4/10 in the last 2 weeks (05/07/21);  up to 2/10 in the last 2 weeks (06/24/21); pain much better controlled but does still experience limitations due to pain with functional activity (07/10/2021);    Time 12    Period Weeks    Status Partially Met      PT LONG TERM GOAL #5   Title Complete community, work and/or recreational activities without limitation due to current condition.    Baseline Functional Limitations: walking, running, playing on beach with grandson, athletic activities, stairs, sleeping, lower extremity dressing, weight bearing activities, jumping (04/02/2021); improved overall but continues to have pain and difficulty with stairs, lifting, bending, moving furnature and item( 05/07/21); continues to improve including getting things off the floor and getting up/down fromthe floor, physically holding out to do yardwork and things like that but still has limitations especially with stairs (06/24/2021); reports significant improvement but does continue to have limitations with stairs and more athletic activities that she wants to be able to do (07/10/2021);    Time 12    Period Weeks    Status Partially Met                   Plan - 07/10/21 1506     Clinical Impression Statement Patient has attended 20 physical therapy sessions this episode of care and has made good progress towards most goals. She reports improved functional ability with less pain during her regular tasks and her FOTO score has improved from 59 to 65. Although patient has not met all of her goals she feels ready for discharge to independent management at this time. She has been provided with appropriate long term HEP and understands how to seek further care if she experiences decline or fails to continue improving. Patient is now discharged from PT to independent management.    Personal Factors and Comorbidities Age;Comorbidity 3+;Time since onset of  injury/illness/exacerbation;Past/Current Experience    Comorbidities Relevant past medical history and comorbidities include low back pain with referral to right thigh, chronic allergic asthma (epi pen in car), acute sinusitis (two surgeries), skin spot removed, hypertension, dental  issues, reflux, hx of cardiac catheterization to prove she doesn't have CHF (heart was healthy), cholecystectomy, osteoarthritis, hx L knee pain (baker's cyst/meniscus).    Examination-Activity Limitations Locomotion Level;Transfers;Stand;Carry;Squat;Stairs;Dressing;Sleep;Lift    Examination-Participation Restrictions Community Activity;Interpersonal Relationship;Laundry;Shop;Yard Work   Difficulty walking, running, playing on beach with grandson, athletic activities, stairs, sleeping, lower extremity dressing, weight bearing activities, jumping, etc, and decreased quality of life.   Stability/Clinical Decision Making Evolving/Moderate complexity    Rehab Potential Good    PT Frequency 2x / week    PT Duration 12 weeks    PT Treatment/Interventions ADLs/Self Care Home Management;Aquatic Therapy;Cryotherapy;Moist Heat;Electrical Stimulation;Therapeutic activities;Gait training;Therapeutic exercise;Neuromuscular re-education;Patient/family education;Passive range of motion;Dry needling;Spinal Manipulations;Joint Manipulations;Manual techniques    PT Next Visit Plan Patient is now discharged from PT to independent management.    PT Home Exercise Plan Medbridge  Access Code: [email or web address]   [email or web address] [id number] ([id number])    Consulted and Agree with Plan of Care Patient             Patient will benefit from skilled therapeutic intervention in order to improve the following deficits and impairments:  Pain, Decreased coordination, Decreased mobility, Increased muscle spasms, Postural dysfunction, Decreased activity tolerance, Decreased endurance, Decreased range of motion, Decreased strength, Hypomobility, Impaired perceived  functional ability, Difficulty walking, Abnormal gait, Improper body mechanics, Impaired flexibility, Decreased balance  Visit Diagnosis: Chronic pain of left knee  Chronic pain of right knee  Difficulty in walking, not elsewhere classified  Muscle weakness (generalized)     Problem List Patient Active Problem List   Diagnosis Date Noted   Low back pain 11/07/2020   Dysuria 10/24/2020   Pain, dental 09/29/2020   Hypercholesterolemia 04/08/2020   Left knee pain 04/08/2020   COVID-19 virus infection 10/31/2019   Stress 09/25/2019   Joint stiffness of hand 06/26/2018   Plantar fasciitis 06/26/2018   Left leg pain 02/21/2018   Hyperglycemia 02/21/2018   Visual disturbance 02/20/2016   Near syncope 02/20/2016   Right lower quadrant pain 08/04/2015   Abnormal liver function tests 08/04/2015   UTI symptoms 03/31/2015   Health care maintenance 02/03/2015   Cough 12/21/2014   History of colonic polyps 05/29/2014   Leukopenia 07/07/2013   Asthma 10/10/2012   Allergic rhinitis 10/10/2012   GERD (gastroesophageal reflux disease) 10/10/2012   Hypertension 10/10/2012    Everlean Alstrom. Graylon Good, PT, DPT 07/10/21, 3:13 PM  [facility] [address], [place], [address] Phone: [phone]   Fax:  [phone]  Name: Chloe Moyer MRN: [id number] Date of Birth: [birth year]

## 2021-07-11 ENCOUNTER — Other Ambulatory Visit (INDEPENDENT_AMBULATORY_CARE_PROVIDER_SITE_OTHER): Payer: PPO

## 2021-07-11 ENCOUNTER — Other Ambulatory Visit: Payer: Self-pay | Admitting: Internal Medicine

## 2021-07-11 ENCOUNTER — Ambulatory Visit: Payer: PPO | Admitting: Internal Medicine

## 2021-07-11 DIAGNOSIS — R7989 Other specified abnormal findings of blood chemistry: Secondary | ICD-10-CM | POA: Diagnosis not present

## 2021-07-11 DIAGNOSIS — H2513 Age-related nuclear cataract, bilateral: Secondary | ICD-10-CM | POA: Diagnosis not present

## 2021-07-11 LAB — T4, FREE: Free T4: 0.94 ng/dL (ref 0.60–1.60)

## 2021-07-11 LAB — T3, FREE: T3, Free: 3.9 pg/mL (ref 2.3–4.2)

## 2021-07-11 NOTE — Progress Notes (Signed)
Order placed for add on labs.   °

## 2021-07-11 NOTE — Progress Notes (Signed)
Order placed for f/u labs.  

## 2021-07-15 ENCOUNTER — Ambulatory Visit: Payer: PPO | Admitting: Physical Therapy

## 2021-07-17 ENCOUNTER — Encounter: Payer: PPO | Admitting: Physical Therapy

## 2021-07-18 ENCOUNTER — Telehealth: Payer: Self-pay | Admitting: Internal Medicine

## 2021-07-18 NOTE — Telephone Encounter (Signed)
Patient is returning your call from earlier about her labs.

## 2021-07-23 DIAGNOSIS — J4540 Moderate persistent asthma, uncomplicated: Secondary | ICD-10-CM | POA: Diagnosis not present

## 2021-07-29 ENCOUNTER — Ambulatory Visit: Payer: PPO

## 2021-08-01 ENCOUNTER — Encounter: Payer: Self-pay | Admitting: Internal Medicine

## 2021-08-01 DIAGNOSIS — Z1231 Encounter for screening mammogram for malignant neoplasm of breast: Secondary | ICD-10-CM

## 2021-08-15 ENCOUNTER — Other Ambulatory Visit: Payer: PPO

## 2021-08-20 ENCOUNTER — Other Ambulatory Visit (INDEPENDENT_AMBULATORY_CARE_PROVIDER_SITE_OTHER): Payer: PPO

## 2021-08-20 ENCOUNTER — Other Ambulatory Visit: Payer: Self-pay

## 2021-08-20 DIAGNOSIS — R7989 Other specified abnormal findings of blood chemistry: Secondary | ICD-10-CM

## 2021-08-20 LAB — TSH: TSH: 0.01 u[IU]/mL — ABNORMAL LOW (ref 0.35–5.50)

## 2021-08-20 LAB — T3, FREE: T3, Free: 5.1 pg/mL — ABNORMAL HIGH (ref 2.3–4.2)

## 2021-08-20 LAB — T4, FREE: Free T4: 1.32 ng/dL (ref 0.60–1.60)

## 2021-08-21 ENCOUNTER — Other Ambulatory Visit: Payer: Self-pay | Admitting: Internal Medicine

## 2021-08-21 DIAGNOSIS — R7989 Other specified abnormal findings of blood chemistry: Secondary | ICD-10-CM

## 2021-08-21 NOTE — Progress Notes (Signed)
Order placed for endocrinology referral.  

## 2021-08-22 ENCOUNTER — Encounter: Payer: Self-pay | Admitting: Internal Medicine

## 2021-08-22 DIAGNOSIS — R7989 Other specified abnormal findings of blood chemistry: Secondary | ICD-10-CM

## 2021-08-24 DIAGNOSIS — Z03818 Encounter for observation for suspected exposure to other biological agents ruled out: Secondary | ICD-10-CM | POA: Diagnosis not present

## 2021-08-26 ENCOUNTER — Telehealth: Payer: Self-pay | Admitting: Internal Medicine

## 2021-08-26 ENCOUNTER — Other Ambulatory Visit: Payer: Self-pay

## 2021-08-26 ENCOUNTER — Ambulatory Visit (INDEPENDENT_AMBULATORY_CARE_PROVIDER_SITE_OTHER): Payer: PPO | Admitting: Internal Medicine

## 2021-08-26 ENCOUNTER — Encounter: Payer: Self-pay | Admitting: Internal Medicine

## 2021-08-26 DIAGNOSIS — Z1231 Encounter for screening mammogram for malignant neoplasm of breast: Secondary | ICD-10-CM | POA: Diagnosis not present

## 2021-08-26 DIAGNOSIS — R739 Hyperglycemia, unspecified: Secondary | ICD-10-CM | POA: Diagnosis not present

## 2021-08-26 DIAGNOSIS — Z86010 Personal history of colonic polyps: Secondary | ICD-10-CM | POA: Diagnosis not present

## 2021-08-26 DIAGNOSIS — I10 Essential (primary) hypertension: Secondary | ICD-10-CM

## 2021-08-26 DIAGNOSIS — D72819 Decreased white blood cell count, unspecified: Secondary | ICD-10-CM

## 2021-08-26 DIAGNOSIS — E7800 Pure hypercholesterolemia, unspecified: Secondary | ICD-10-CM | POA: Diagnosis not present

## 2021-08-26 DIAGNOSIS — K219 Gastro-esophageal reflux disease without esophagitis: Secondary | ICD-10-CM

## 2021-08-26 DIAGNOSIS — J4520 Mild intermittent asthma, uncomplicated: Secondary | ICD-10-CM | POA: Diagnosis not present

## 2021-08-26 NOTE — Telephone Encounter (Signed)
Patient scheduled for labs 12/13/21 as requested by check out note.   Needing orders placed.

## 2021-08-26 NOTE — Progress Notes (Signed)
Patient ID: Chloe Moyer, female   DOB: 06-01-1951, 70 y.o.   MRN: [id number]   Subjective:    Patient ID: Chloe Moyer, female    DOB: 1951-10-24, 70 y.o.   MRN: [id number]  This visit occurred during the SARS-CoV-2 public health emergency.  Safety protocols were in place, including screening questions prior to the visit, additional usage of staff PPE, and extensive cleaning of exam room while observing appropriate contact time as indicated for disinfecting solutions.   Patient here for a scheduled follow up.   Marland Kitchen   HPI Here to follow up regarding her blood pressure, asthma, reflux and cholesterol.  Saw pulmonary 07/23/21 - f/u asthma.  Recommended continuing symbicort, [facility] sinus rinses.  Was seen acute care 08/24/21 - fever, congestion.  States symptoms started after working in her yard - [employer].  Placed on doxycycline, tessalon perles and prednisone.  Noticed acid reflux.  On prilosec.  No chest pain.  Breathing stable.  No abdominal pain.  No bowel change reported.  S/p PT left knee.  Now doing exercises at home.     Past Medical History:  Diagnosis Date   Allergic rhinitis    Arthritis    Asthma    Diverticulosis, sigmoid    GERD (gastroesophageal reflux disease)    History of hiatal hernia    Hypertension 06-12-2005   Ulcer    Past Surgical History:  Procedure Laterality Date   BREAST BIOPSY Right 09/01/2017   Affirm Bx of 2 areas- both areas were fibroadenomatoid change    BREAST CYST ASPIRATION Left 1990   CARDIAC CATHETERIZATION  July 2012   CHOLECYSTECTOMY  1991   LIPOMA EXCISION  4/99   Left flank area   NASAL SINUS SURGERY  July 2012   TUBAL LIGATION  1984   Family History  Problem Relation Age of Onset   Emphysema Father        smoked   Asthma Father    Lung cancer Father        smoked   Hypertension Father    Hypertension Mother    Cancer Mother        oral   Breast cancer Maternal Grandmother 33   Heart disease Paternal Grandfather         myocardial infarction   Stroke Maternal Grandfather    Social History   Socioeconomic History   Marital status: Married    Spouse name: Not on file   Number of children: 2   Years of education: Not on file   Highest education level: Not on file  Occupational History   Occupation: Works at [employer]  Tobacco Use   Smoking status: Never   Smokeless tobacco: Never  Substance and Sexual Activity   Alcohol use: Yes    Alcohol/week: 0.0 standard drinks    Comment: occ wine with dinner   Drug use: No   Sexual activity: Yes  Other Topics Concern   Not on file  Social History Narrative   Not on file   Social Determinants of Health   Financial Resource Strain: Not on file  Food Insecurity: Not on file  Transportation Needs: Not on file  Physical Activity: Not on file  Stress: Not on file  Social Connections: Not on file     Review of Systems  Constitutional:  Negative for appetite change and unexpected weight change.  HENT:  Negative for sinus pressure.        Congestion as outlined.  Respiratory:  Negative for cough, chest tightness and shortness of breath.   Cardiovascular:  Negative for chest pain, palpitations and leg swelling.  Gastrointestinal:  Negative for abdominal pain, diarrhea, nausea and vomiting.  Genitourinary:  Negative for difficulty urinating and dysuria.  Musculoskeletal:  Negative for joint swelling and myalgias.  Skin:  Negative for color change and rash.  Neurological:  Negative for dizziness, light-headedness and headaches.  Psychiatric/Behavioral:  Negative for agitation and dysphoric mood.       Objective:     BP 128/82   Pulse (!) 116   Temp (!) 97.2 F (36.2 C)   Ht 5' 5.98" (1.676 m)   Wt 166 lb 6.4 oz (75.5 kg)   LMP 11/03/1996   SpO2 97%   BMI 26.87 kg/m  Wt Readings from Last 3 Encounters:  08/26/21 166 lb 6.4 oz (75.5 kg)  01/28/21 168 lb 9.6 oz (76.5 kg)  11/07/20 170 lb 12.8 oz (77.5 kg)    Physical  Exam Vitals reviewed.  Constitutional:      General: She is not in acute distress.    Appearance: Normal appearance.  HENT:     Head: Normocephalic and atraumatic.     Right Ear: External ear normal.     Left Ear: External ear normal.  Eyes:     General: No scleral icterus.       Right eye: No discharge.        Left eye: No discharge.     Conjunctiva/sclera: Conjunctivae normal.  Neck:     Thyroid: No thyromegaly.  Cardiovascular:     Rate and Rhythm: Normal rate and regular rhythm.  Pulmonary:     Effort: No respiratory distress.     Breath sounds: Normal breath sounds. No wheezing.  Abdominal:     General: Bowel sounds are normal.     Palpations: Abdomen is soft.     Tenderness: There is no abdominal tenderness.  Musculoskeletal:        General: No swelling or tenderness.     Cervical back: Neck supple. No tenderness.  Lymphadenopathy:     Cervical: No cervical adenopathy.  Skin:    Findings: No erythema or rash.  Neurological:     Mental Status: She is alert.  Psychiatric:        Mood and Affect: Mood normal.        Behavior: Behavior normal.     Outpatient Encounter Medications as of 08/26/2021  Medication Sig   albuterol (PROVENTIL HFA;VENTOLIN HFA) 108 (90 BASE) MCG/ACT inhaler Inhale 2 puffs using inhaler every 4-6 hours as needed for SOB/wheeze.   benzonatate (TESSALON) 200 MG capsule Take by mouth.   budesonide (PULMICORT) 0.5 MG/2ML nebulizer solution Take 0.5 mg by nebulization 2 (two) times daily.   budesonide-formoterol (SYMBICORT) 160-4.5 MCG/ACT inhaler Inhale 2 puffs into the lungs daily. as directed.   doxycycline (VIBRA-TABS) 100 MG tablet Take 100 mg by mouth 2 (two) times daily.   EPINEPHrine (EPIPEN 2-PAK) 0.3 mg/0.3 mL IJ SOAJ injection Inject 0.3 mLs (0.3 mg total) into the muscle once.   hydrocortisone (ANUSOL-HC) 25 MG suppository Place 1 suppository (25 mg total) rectally 2 (two) times daily as needed.   Hypertonic Nasal Wash (SINUS RINSE NA)  As directed three times daily   ipratropium-albuterol (DUONEB) 0.5-2.5 (3) MG/3ML SOLN    losartan (COZAAR) 100 MG tablet TAKE 1 TABLET BY MOUTH ONCE DAILY   omalizumab (XOLAIR) 150 MG injection 150 mg every 30 (thirty) days.  omeprazole (PRILOSEC) 10 MG capsule Take 1 capsule (10 mg total) by mouth daily.   predniSONE (DELTASONE) 10 MG tablet Take by mouth.   rosuvastatin (CRESTOR) 5 MG tablet TAKE 1 TABLET BY MOUTH ON MONDAY AND THURSDAY   [DISCONTINUED] celecoxib (CELEBREX) 200 MG capsule Take by mouth. (Patient not taking: Reported on 08/26/2021)   No facility-administered encounter medications on file as of 08/26/2021.     Lab Results  Component Value Date   WBC 3.3 (L) 07/09/2021   HGB 12.6 07/09/2021   HCT 37.8 07/09/2021   PLT 168.0 07/09/2021   GLUCOSE 85 07/09/2021   CHOL 130 07/09/2021   TRIG 75.0 07/09/2021   HDL 42.70 07/09/2021   LDLCALC 73 07/09/2021   ALT 22 07/09/2021   AST 30 07/09/2021   NA 137 07/09/2021   K 3.7 07/09/2021   CL 104 07/09/2021   CREATININE 0.74 07/09/2021   BUN 13 07/09/2021   CO2 25 07/09/2021   TSH 0.01 (L) 08/20/2021   INR 0.94 01/07/2017   HGBA1C 5.7 07/09/2021   MICROALBUR 1.2 03/27/2020    DG Lumbar Spine 2-3 Views  Result Date: 11/08/2020 CLINICAL DATA:  Low back pain EXAM: LUMBAR SPINE - 2-3 VIEW COMPARISON:  None. FINDINGS: Frontal, lateral, and spot lumbosacral lateral images were obtained. There are 5 non-rib-bearing lumbar type vertebral bodies. T12 ribs are markedly hypoplastic. There is thoracolumbar levoscoliosis. There is no fracture or spondylolisthesis. There is moderately severe disc space narrowing at L3-4 and L4-5 with milder disc space narrowing at L5-S1. There is facet osteoarthritic change at L5-S1 bilaterally and at L4-5 on the right. IMPRESSION: Scoliosis with osteoarthritic change at several levels. No fracture or spondylolisthesis. Electronically Signed   By: Lowella Grip III M.D.   On: 11/08/2020 08:15        Assessment & Plan:   Problem List Items Addressed This Visit     Asthma    Seeing pulmonary.  Continues xolair, symbicort.  Not needing frequent nebs.  Breathing overall stable.  Follow.       Relevant Medications   predniSONE (DELTASONE) 10 MG tablet   GERD (gastroesophageal reflux disease)    Take omeprazole regularly.  Follow.        History of colonic polyps    Per note, due f/u colonoscopy.  Need to arrange.       Hypercholesterolemia    Continue crestor. Controlled.  Low cholesterol diet and exercise.  Follow lipid panel and liver function tests.   Lab Results  Component Value Date   CHOL 130 07/09/2021   HDL 42.70 07/09/2021   LDLCALC 73 07/09/2021   TRIG 75.0 07/09/2021   CHOLHDL 3 07/09/2021       Hyperglycemia    Low carb diet and exercise.  Follow met b and a1c.  Discussed recent labs.   Lab Results  Component Value Date   HGBA1C 5.7 07/09/2021       Hypertension    Continue losartan.  Blood pressure as outlined.  Follow pressures.  Follow metabolic panel.       Leukopenia    Recheck cbc with next labs.  (wbc stable round 3.3-3.4).        Relevant Orders   CBC with Differential/Platelet   Other Visit Diagnoses     Encounter for screening mammogram for malignant neoplasm of breast       Relevant Orders   MM 3D SCREEN BREAST BILATERAL        Einar Pheasant, MD

## 2021-08-28 NOTE — Addendum Note (Signed)
Addended by: Lars Masson on: 08/28/2021 01:39 PM   Modules accepted: Orders

## 2021-08-28 NOTE — Telephone Encounter (Signed)
Future labs placed. 

## 2021-08-31 ENCOUNTER — Encounter: Payer: Self-pay | Admitting: Internal Medicine

## 2021-08-31 ENCOUNTER — Telehealth: Payer: Self-pay | Admitting: Internal Medicine

## 2021-08-31 NOTE — Assessment & Plan Note (Signed)
Per note, due f/u colonoscopy.  Need to arrange.

## 2021-08-31 NOTE — Assessment & Plan Note (Signed)
Low carb diet and exercise.  Follow met b and a1c.  Discussed recent labs.   Lab Results  Component Value Date   HGBA1C 5.7 07/09/2021   

## 2021-08-31 NOTE — Assessment & Plan Note (Signed)
Seeing pulmonary.  Continues xolair, symbicort.  Not needing frequent nebs.  Breathing overall stable.  Follow.

## 2021-08-31 NOTE — Assessment & Plan Note (Signed)
Continue losartan.  Blood pressure as outlined.  Follow pressures.  Follow metabolic panel.  

## 2021-08-31 NOTE — Assessment & Plan Note (Signed)
Recheck cbc with next labs.  (wbc stable round 3.3-3.4).

## 2021-08-31 NOTE — Assessment & Plan Note (Signed)
Take omeprazole regularly.  Follow.

## 2021-08-31 NOTE — Telephone Encounter (Signed)
Notify Chloe Moyer that per our records she is overdue colonoscopy.  Need to confirm if has had one recently.  If so, need copy of report.  If not, need to refer.  Previously saw Dr Tiffany Kocher.  Need to confirm if wants to stay with [facility]. (See other phone message on pt).

## 2021-08-31 NOTE — Assessment & Plan Note (Signed)
Continue crestor. Controlled.  Low cholesterol diet and exercise.  Follow lipid panel and liver function tests.   Lab Results  Component Value Date   CHOL 130 07/09/2021   HDL 42.70 07/09/2021   LDLCALC 73 07/09/2021   TRIG 75.0 07/09/2021   CHOLHDL 3 07/09/2021

## 2021-09-01 NOTE — Telephone Encounter (Signed)
Chloe Moyer sent message back stating she would be agreeable to go to [facility].  Her appt in town was not until 01/2022.  Order placed for referral to endocrinology - through [facility].  Wanted you to be aware.

## 2021-09-02 NOTE — Telephone Encounter (Signed)
Patient has not had colonoscopy done and would like to wait until spring to set one up. Does not wish to have done at this time.

## 2021-09-02 NOTE — Telephone Encounter (Signed)
LMTCB

## 2021-09-07 NOTE — Telephone Encounter (Signed)
See my chart message.  Chloe Moyer is requesting referral sent to [facility] or [facility] end[facility]ology.  Do I need to place order for another referral.

## 2021-09-12 NOTE — Telephone Encounter (Signed)
Ok.  Thank you.    Dr Nicki Reaper

## 2021-09-12 NOTE — Telephone Encounter (Signed)
Order has been placed for referral to endocrinology [facility] or [facility]).  Thank you.

## 2021-10-02 DIAGNOSIS — J324 Chronic pansinusitis: Secondary | ICD-10-CM | POA: Diagnosis not present

## 2021-10-02 DIAGNOSIS — E079 Disorder of thyroid, unspecified: Secondary | ICD-10-CM | POA: Diagnosis not present

## 2021-10-02 DIAGNOSIS — E0590 Thyrotoxicosis, unspecified without thyrotoxic crisis or storm: Secondary | ICD-10-CM | POA: Diagnosis not present

## 2021-10-07 ENCOUNTER — Other Ambulatory Visit: Payer: Self-pay | Admitting: Internal Medicine

## 2021-10-17 DIAGNOSIS — E0590 Thyrotoxicosis, unspecified without thyrotoxic crisis or storm: Secondary | ICD-10-CM | POA: Diagnosis not present

## 2021-10-23 ENCOUNTER — Ambulatory Visit
Admission: RE | Admit: 2021-10-23 | Discharge: 2021-10-23 | Disposition: A | Discharge status: Home / Self Care | Payer: PPO | Source: Ambulatory Visit | Attending: Internal Medicine | Admitting: Internal Medicine

## 2021-10-23 ENCOUNTER — Other Ambulatory Visit: Payer: Self-pay

## 2021-10-23 DIAGNOSIS — Z1231 Encounter for screening mammogram for malignant neoplasm of breast: Secondary | ICD-10-CM | POA: Diagnosis not present

## 2021-11-07 DIAGNOSIS — L570 Actinic keratosis: Secondary | ICD-10-CM | POA: Diagnosis not present

## 2021-11-29 DIAGNOSIS — N390 Urinary tract infection, site not specified: Secondary | ICD-10-CM | POA: Diagnosis not present

## 2021-11-29 DIAGNOSIS — R350 Frequency of micturition: Secondary | ICD-10-CM | POA: Diagnosis not present

## 2021-12-13 ENCOUNTER — Other Ambulatory Visit (INDEPENDENT_AMBULATORY_CARE_PROVIDER_SITE_OTHER): Payer: PPO

## 2021-12-13 ENCOUNTER — Other Ambulatory Visit: Payer: Self-pay

## 2021-12-13 DIAGNOSIS — R739 Hyperglycemia, unspecified: Secondary | ICD-10-CM | POA: Diagnosis not present

## 2021-12-13 DIAGNOSIS — D72819 Decreased white blood cell count, unspecified: Secondary | ICD-10-CM

## 2021-12-13 DIAGNOSIS — I10 Essential (primary) hypertension: Secondary | ICD-10-CM | POA: Diagnosis not present

## 2021-12-13 DIAGNOSIS — E7800 Pure hypercholesterolemia, unspecified: Secondary | ICD-10-CM

## 2021-12-13 LAB — CBC WITH DIFFERENTIAL/PLATELET
Basophils Absolute: 0 10*3/uL (ref 0.0–0.1)
Basophils Relative: 1.2 % (ref 0.0–3.0)
Eosinophils Absolute: 0.3 10*3/uL (ref 0.0–0.7)
Eosinophils Relative: 8.1 % — ABNORMAL HIGH (ref 0.0–5.0)
HCT: 40.7 % (ref 36.0–46.0)
Hemoglobin: 13.3 g/dL (ref 12.0–15.0)
Lymphocytes Relative: 34.1 % (ref 12.0–46.0)
Lymphs Abs: 1.2 10*3/uL (ref 0.7–4.0)
MCHC: 32.6 g/dL (ref 30.0–36.0)
MCV: 86.8 fl (ref 78.0–100.0)
Monocytes Absolute: 0.4 10*3/uL (ref 0.1–1.0)
Monocytes Relative: 10.1 % (ref 3.0–12.0)
Neutro Abs: 1.7 10*3/uL (ref 1.4–7.7)
Neutrophils Relative %: 46.5 % (ref 43.0–77.0)
Platelets: 189 10*3/uL (ref 150.0–400.0)
RBC: 4.69 Mil/uL (ref 3.87–5.11)
RDW: 14.7 % (ref 11.5–15.5)
WBC: 3.7 10*3/uL — ABNORMAL LOW (ref 4.0–10.5)

## 2021-12-13 LAB — HEPATIC FUNCTION PANEL
ALT: 38 U/L — ABNORMAL HIGH (ref 0–35)
AST: 44 U/L — ABNORMAL HIGH (ref 0–37)
Albumin: 4.5 g/dL (ref 3.5–5.2)
Alkaline Phosphatase: 70 U/L (ref 39–117)
Bilirubin, Direct: 0.3 mg/dL (ref 0.0–0.3)
Total Bilirubin: 1 mg/dL (ref 0.2–1.2)
Total Protein: 7.8 g/dL (ref 6.0–8.3)

## 2021-12-13 LAB — LIPID PANEL
Cholesterol: 173 mg/dL (ref 0–200)
HDL: 47.1 mg/dL (ref >39.00)
LDL Cholesterol: 104 mg/dL — ABNORMAL HIGH (ref 0–99)
NonHDL: 125.93
Total CHOL/HDL Ratio: 4
Triglycerides: 108 mg/dL (ref 0.0–149.0)
VLDL: 21.6 mg/dL (ref 0.0–40.0)

## 2021-12-13 LAB — BASIC METABOLIC PANEL
BUN: 16 mg/dL (ref 6–23)
CO2: 29 mEq/L (ref 19–32)
Calcium: 10.2 mg/dL (ref 8.4–10.5)
Chloride: 102 mEq/L (ref 96–112)
Creatinine, Ser: 0.67 mg/dL (ref 0.40–1.20)
GFR: 87.48 mL/min (ref >60.00)
Glucose, Bld: 92 mg/dL (ref 70–99)
Potassium: 4.1 mEq/L (ref 3.5–5.1)
Sodium: 137 mEq/L (ref 135–145)

## 2021-12-16 ENCOUNTER — Other Ambulatory Visit: Payer: Self-pay

## 2021-12-16 ENCOUNTER — Ambulatory Visit (INDEPENDENT_AMBULATORY_CARE_PROVIDER_SITE_OTHER): Payer: PPO | Admitting: Internal Medicine

## 2021-12-16 VITALS — BP 132/74 | HR 80 | Temp 97.4°F | Resp 16 | Ht 66.0 in | Wt 167.8 lb

## 2021-12-16 DIAGNOSIS — K219 Gastro-esophageal reflux disease without esophagitis: Secondary | ICD-10-CM | POA: Diagnosis not present

## 2021-12-16 DIAGNOSIS — J4520 Mild intermittent asthma, uncomplicated: Secondary | ICD-10-CM | POA: Diagnosis not present

## 2021-12-16 DIAGNOSIS — Z809 Family history of malignant neoplasm, unspecified: Secondary | ICD-10-CM

## 2021-12-16 DIAGNOSIS — E7800 Pure hypercholesterolemia, unspecified: Secondary | ICD-10-CM

## 2021-12-16 DIAGNOSIS — R739 Hyperglycemia, unspecified: Secondary | ICD-10-CM

## 2021-12-16 DIAGNOSIS — I10 Essential (primary) hypertension: Secondary | ICD-10-CM

## 2021-12-16 DIAGNOSIS — R7989 Other specified abnormal findings of blood chemistry: Secondary | ICD-10-CM | POA: Diagnosis not present

## 2021-12-16 DIAGNOSIS — M25511 Pain in right shoulder: Secondary | ICD-10-CM

## 2021-12-16 DIAGNOSIS — Z0000 Encounter for general adult medical examination without abnormal findings: Secondary | ICD-10-CM | POA: Diagnosis not present

## 2021-12-16 DIAGNOSIS — R300 Dysuria: Secondary | ICD-10-CM

## 2021-12-16 LAB — URINALYSIS, ROUTINE W REFLEX MICROSCOPIC
Bilirubin Urine: NEGATIVE
Hgb urine dipstick: NEGATIVE
Ketones, ur: NEGATIVE
Leukocytes,Ua: NEGATIVE
Nitrite: NEGATIVE
RBC / HPF: NONE SEEN (ref 0–?)
Specific Gravity, Urine: 1.02 (ref 1.000–1.030)
Total Protein, Urine: NEGATIVE
Urine Glucose: NEGATIVE
Urobilinogen, UA: 0.2 (ref 0.0–1.0)
pH: 6 (ref 5.0–8.0)

## 2021-12-16 LAB — HEMOGLOBIN A1C: Hgb A1c MFr Bld: 5.7 % (ref 4.6–6.5)

## 2021-12-16 MED ORDER — HYDROCORTISONE ACETATE 25 MG RE SUPP: 25.0000 mg | Freq: Two times a day (BID) | RECTAL | 0 refills | Status: DC | PRN

## 2021-12-16 NOTE — Assessment & Plan Note (Addendum)
Physical today 12/16/21.  Mammogram 10/23/21 - Birads I.  Colonoscopy - per note, due.

## 2021-12-16 NOTE — Assessment & Plan Note (Addendum)
The 10-year ASCVD risk score (Arnett DK, et al., 2019) is: 16.2%   Values used to calculate the score:     Age: 71 years     Sex: Female     Is Non-Hispanic African American: No     Diabetic: No     Tobacco smoker: No     Systolic Blood Pressure: 470 mmHg     Is BP treated: Yes     HDL Cholesterol: 47.1 mg/dL     Total Cholesterol: 173 mg/dL  Low cholesterol diet and exercise.  Recheck liver panel.  Follow lipid panel.

## 2021-12-16 NOTE — Progress Notes (Signed)
Patient ID: TANICA GAIGE, female   DOB: 1951/01/31, 71 y.o.   MRN: [id number]   Subjective:    Patient ID: KINZEY SHERIFF, female    DOB: 07-21-1951, 71 y.o.   MRN: [id number]  This visit occurred during the SARS-CoV-2 public health emergency.  Safety protocols were in place, including screening questions prior to the visit, additional usage of staff PPE, and extensive cleaning of exam room while observing appropriate contact time as indicated for disinfecting solutions.   Patient here for her physical exam.   Chief Complaint  Patient presents with   Annual Exam   .   HPI Recently seen UC.  Diagnosed with UTI. Treated with cipro.  Request f/u urine to confirm clear.  Breathing stable.  Continues symbicort and xolair.  No chest pain reported.  No increased cough or congestion.  Has appt with endocrinology tomorrow.  No increased abdominal pain reported.  May occasionally notice some upper GI symptoms if eats at night (mexican, onions and peppers).  Right shoulder pain - notices when reaches up or turns over in bed.  Discussed family history - mother with mouth cancer, father - lung cancer and maternal grandmother -  breast cancer.  Discussed genetic counseling.     Past Medical History:  Diagnosis Date   Allergic rhinitis    Arthritis    Asthma    Diverticulosis, sigmoid    GERD (gastroesophageal reflux disease)    History of hiatal hernia    Hypertension 06-12-2005   Ulcer    Past Surgical History:  Procedure Laterality Date   BREAST BIOPSY Right 09/01/2017   Affirm Bx of 2 areas- both areas were fibroadenomatoid change    BREAST CYST ASPIRATION Left 1990   CARDIAC CATHETERIZATION  July 2012   CHOLECYSTECTOMY  1991   LIPOMA EXCISION  4/99   Left flank area   NASAL SINUS SURGERY  July 2012   TUBAL LIGATION  1984   Family History  Problem Relation Age of Onset   Emphysema Father        smoked   Asthma Father    Lung cancer Father        smoked   Hypertension Father     Hypertension Mother    Cancer Mother        oral   Breast cancer Maternal Grandmother 43   Heart disease Paternal Grandfather        myocardial infarction   Stroke Maternal Grandfather    Social History   Socioeconomic History   Marital status: Married    Spouse name: Not on file   Number of children: 2   Years of education: Not on file   Highest education level: Not on file  Occupational History   Occupation: Works at [employer]  Tobacco Use   Smoking status: Never   Smokeless tobacco: Never  Substance and Sexual Activity   Alcohol use: Yes    Alcohol/week: 0.0 standard drinks    Comment: occ wine with dinner   Drug use: No   Sexual activity: Yes  Other Topics Concern   Not on file  Social History Narrative   Not on file   Social Determinants of Health   Financial Resource Strain: Not on file  Food Insecurity: Not on file  Transportation Needs: Not on file  Physical Activity: Not on file  Stress: Not on file  Social Connections: Not on file     Review of Systems  Constitutional:  Negative for appetite change  and unexpected weight change.  HENT:  Negative for congestion, sinus pressure and sore throat.   Eyes:  Negative for pain and visual disturbance.  Respiratory:  Negative for cough, chest tightness and shortness of breath.   Cardiovascular:  Negative for chest pain, palpitations and leg swelling.  Gastrointestinal:  Negative for abdominal pain, diarrhea, nausea and vomiting.  Genitourinary:  Negative for difficulty urinating and dysuria.  Musculoskeletal:  Negative for joint swelling and myalgias.       Right shoulder pain as outlined.   Skin:  Negative for color change and rash.  Neurological:  Negative for dizziness, light-headedness and headaches.  Hematological:  Negative for adenopathy. Does not bruise/bleed easily.  Psychiatric/Behavioral:  Negative for agitation and dysphoric mood.       Objective:     BP 132/74    Pulse 80    Temp (!)  97.4 F (36.3 C)    Resp 16    Ht '5\' 6"'  (1.676 m)    Wt 167 lb 12.8 oz (76.1 kg)    LMP 11/03/1996    SpO2 98%    BMI 27.08 kg/m  Wt Readings from Last 3 Encounters:  12/16/21 167 lb 12.8 oz (76.1 kg)  08/26/21 166 lb 6.4 oz (75.5 kg)  01/28/21 168 lb 9.6 oz (76.5 kg)    Physical Exam Vitals reviewed.  Constitutional:      General: She is not in acute distress.    Appearance: Normal appearance. She is well-developed.  HENT:     Head: Normocephalic and atraumatic.     Right Ear: External ear normal.     Left Ear: External ear normal.  Eyes:     General: No scleral icterus.       Right eye: No discharge.        Left eye: No discharge.     Conjunctiva/sclera: Conjunctivae normal.  Neck:     Thyroid: No thyromegaly.  Cardiovascular:     Rate and Rhythm: Normal rate and regular rhythm.  Pulmonary:     Effort: No tachypnea, accessory muscle usage or respiratory distress.     Breath sounds: Normal breath sounds. No decreased breath sounds or wheezing.  Chest:  Breasts:    Right: No inverted nipple, mass, nipple discharge or tenderness (no axillary adenopathy).     Left: No inverted nipple, mass, nipple discharge or tenderness (no axilarry adenopathy).  Abdominal:     General: Bowel sounds are normal.     Palpations: Abdomen is soft.     Tenderness: There is no abdominal tenderness.  Musculoskeletal:        General: No swelling or tenderness.     Cervical back: Neck supple.  Lymphadenopathy:     Cervical: No cervical adenopathy.  Skin:    Findings: No erythema or rash.  Neurological:     Mental Status: She is alert and oriented to person, place, and time.  Psychiatric:        Mood and Affect: Mood normal.        Behavior: Behavior normal.     Outpatient Encounter Medications as of 12/16/2021  Medication Sig   albuterol (PROVENTIL HFA;VENTOLIN HFA) 108 (90 BASE) MCG/ACT inhaler Inhale 2 puffs using inhaler every 4-6 hours as needed for SOB/wheeze.   budesonide  (PULMICORT) 0.5 MG/2ML nebulizer solution Take 0.5 mg by nebulization 2 (two) times daily.   budesonide-formoterol (SYMBICORT) 160-4.5 MCG/ACT inhaler Inhale 2 puffs into the lungs daily. as directed.   doxycycline (VIBRA-TABS) 100 MG tablet Take  100 mg by mouth 2 (two) times daily.   EPINEPHrine (EPIPEN 2-PAK) 0.3 mg/0.3 mL IJ SOAJ injection Inject 0.3 mLs (0.3 mg total) into the muscle once.   hydrocortisone (ANUSOL-HC) 25 MG suppository Place 1 suppository (25 mg total) rectally 2 (two) times daily as needed.   Hypertonic Nasal Wash (SINUS RINSE NA) As directed three times daily   ipratropium-albuterol (DUONEB) 0.5-2.5 (3) MG/3ML SOLN    losartan (COZAAR) 100 MG tablet TAKE 1 TABLET BY MOUTH ONCE DAILY   omalizumab (XOLAIR) 150 MG injection 150 mg every 30 (thirty) days.    omeprazole (PRILOSEC) 10 MG capsule Take 1 capsule (10 mg total) by mouth daily.   predniSONE (DELTASONE) 10 MG tablet Take by mouth.   rosuvastatin (CRESTOR) 5 MG tablet TAKE 1 TABLET BY MOUTH ON MONDAY AND THURSDAY   [DISCONTINUED] hydrocortisone (ANUSOL-HC) 25 MG suppository Place 1 suppository (25 mg total) rectally 2 (two) times daily as needed.   No facility-administered encounter medications on file as of 12/16/2021.     Lab Results  Component Value Date   WBC 3.7 (L) 12/13/2021   HGB 13.3 12/13/2021   HCT 40.7 12/13/2021   PLT 189.0 12/13/2021   GLUCOSE 92 12/13/2021   CHOL 173 12/13/2021   TRIG 108.0 12/13/2021   HDL 47.10 12/13/2021   LDLCALC 104 (H) 12/13/2021   ALT 38 (H) 12/13/2021   AST 44 (H) 12/13/2021   NA 137 12/13/2021   K 4.1 12/13/2021   CL 102 12/13/2021   CREATININE 0.67 12/13/2021   BUN 16 12/13/2021   CO2 29 12/13/2021   TSH 0.01 (L) 08/20/2021   INR 0.94 01/07/2017   HGBA1C 5.7 12/13/2021   MICROALBUR 1.2 03/27/2020    MM 3D SCREEN BREAST BILATERAL  Result Date: 10/24/2021 CLINICAL DATA:  Screening. EXAM: DIGITAL SCREENING BILATERAL MAMMOGRAM WITH TOMOSYNTHESIS AND CAD  TECHNIQUE: Bilateral screening digital craniocaudal and mediolateral oblique mammograms were obtained. Bilateral screening digital breast tomosynthesis was performed. The images were evaluated with computer-aided detection. COMPARISON:  Previous exam(s). ACR Breast Density Category c: The breast tissue is heterogeneously dense, which may obscure small masses. FINDINGS: There are no findings suspicious for malignancy. IMPRESSION: No mammographic evidence of malignancy. A result letter of this screening mammogram will be mailed directly to the patient. RECOMMENDATION: Screening mammogram in one year. (Code:SM-B-01Y) BI-RADS CATEGORY  1: Negative. Electronically Signed   By: Lovey Newcomer M.D.   On: 10/24/2021 09:40      Assessment & Plan:   Problem List Items Addressed This Visit     Abnormal liver function tests    S/p previous liver biopsy -- MILD STEATOHEPATITIS, GRADE 1.   Recent liver panel - slightly elevated AST/ALT.  Recheck liver panel.       Relevant Orders   Hepatic function panel   Asthma    Breathing overall stable - xolair and symbicort.       Dysuria    Recently treated for UTI.  Desires recheck to confirm clear.        Relevant Orders   Urinalysis, Routine w reflex microscopic (Completed)   Urine Culture (Completed)   Family history of cancer    Multiple relatives as outlined.  Discussed referral for genetic testing.  Order placed for referral.       Relevant Orders   Ambulatory referral to Genetics   GERD (gastroesophageal reflux disease)    Symptoms as outlined.  Avoid foods that aggravate and avoid eating late.  Omeprazole.  Health care maintenance    Physical today 12/16/21.  Mammogram 10/23/21 - Birads I.  Colonoscopy - per note, due.       Hypercholesterolemia    The 10-year ASCVD risk score (Arnett DK, et al., 2019) is: 16.2%   Values used to calculate the score:     Age: 54 years     Sex: Female     Is Non-Hispanic African American: No     Diabetic:  No     Tobacco smoker: No     Systolic Blood Pressure: 702 mmHg     Is BP treated: Yes     HDL Cholesterol: 47.1 mg/dL     Total Cholesterol: 173 mg/dL  Low cholesterol diet and exercise.  Recheck liver panel.  Follow lipid panel.       Hyperglycemia    Low carb diet and exercise.  Follow met b and a1c.  Discussed recent labs.   Lab Results  Component Value Date   HGBA1C 5.7 12/13/2021       Hypertension    Continue losartan.  Blood pressure as outlined.  Follow pressures.  Follow metabolic panel.       Right shoulder pain    Persistent right shoulder pain as outlined.  Consider  - PT/ortho referral.        Other Visit Diagnoses     Routine general medical examination at a health care facility    -  Primary        Einar Pheasant, MD

## 2021-12-17 DIAGNOSIS — M8580 Other specified disorders of bone density and structure, unspecified site: Secondary | ICD-10-CM | POA: Diagnosis not present

## 2021-12-17 DIAGNOSIS — E0500 Thyrotoxicosis with diffuse goiter without thyrotoxic crisis or storm: Secondary | ICD-10-CM | POA: Diagnosis not present

## 2021-12-17 DIAGNOSIS — E0590 Thyrotoxicosis, unspecified without thyrotoxic crisis or storm: Secondary | ICD-10-CM | POA: Diagnosis not present

## 2021-12-17 DIAGNOSIS — Z6827 Body mass index (BMI) 27.0-27.9, adult: Secondary | ICD-10-CM | POA: Diagnosis not present

## 2021-12-17 DIAGNOSIS — M8588 Other specified disorders of bone density and structure, other site: Secondary | ICD-10-CM | POA: Diagnosis not present

## 2021-12-18 ENCOUNTER — Telehealth: Payer: Self-pay | Admitting: Internal Medicine

## 2021-12-18 LAB — URINE CULTURE
MICRO NUMBER:: 12937125
SPECIMEN QUALITY:: ADEQUATE

## 2021-12-18 NOTE — Telephone Encounter (Signed)
Spoke with patient. See lab note.

## 2021-12-18 NOTE — Telephone Encounter (Signed)
Pt called in stating that she had some lab work done from [facility] and they are supposed to send them results to the provider

## 2021-12-23 DIAGNOSIS — M8588 Other specified disorders of bone density and structure, other site: Secondary | ICD-10-CM | POA: Diagnosis not present

## 2021-12-23 DIAGNOSIS — M85852 Other specified disorders of bone density and structure, left thigh: Secondary | ICD-10-CM | POA: Diagnosis not present

## 2021-12-23 DIAGNOSIS — Z780 Asymptomatic menopausal state: Secondary | ICD-10-CM | POA: Diagnosis not present

## 2021-12-29 ENCOUNTER — Encounter: Payer: Self-pay | Admitting: Internal Medicine

## 2021-12-29 DIAGNOSIS — Z809 Family history of malignant neoplasm, unspecified: Secondary | ICD-10-CM | POA: Insufficient documentation

## 2021-12-29 DIAGNOSIS — M25511 Pain in right shoulder: Secondary | ICD-10-CM | POA: Insufficient documentation

## 2021-12-29 NOTE — Assessment & Plan Note (Signed)
Recently treated for UTI.  Desires recheck to confirm clear.

## 2021-12-29 NOTE — Assessment & Plan Note (Addendum)
S/p previous liver biopsy -- MILD STEATOHEPATITIS, GRADE 1.   Recent liver panel - slightly elevated AST/ALT.  Recheck liver panel.

## 2021-12-29 NOTE — Assessment & Plan Note (Signed)
Persistent right shoulder pain as outlined.  Consider  - PT/ortho referral.

## 2021-12-29 NOTE — Assessment & Plan Note (Signed)
Multiple relatives as outlined.  Discussed referral for genetic testing.  Order placed for referral.

## 2021-12-29 NOTE — Assessment & Plan Note (Signed)
Continue losartan.  Blood pressure as outlined.  Follow pressures.  Follow metabolic panel.  

## 2021-12-29 NOTE — Assessment & Plan Note (Signed)
Low carb diet and exercise.  Follow met b and a1c.  Discussed recent labs.   Lab Results  Component Value Date   HGBA1C 5.7 12/13/2021

## 2021-12-29 NOTE — Assessment & Plan Note (Signed)
Symptoms as outlined.  Avoid foods that aggravate and avoid eating late.  Omeprazole.

## 2021-12-29 NOTE — Assessment & Plan Note (Signed)
Breathing overall stable - xolair and symbicort.  

## 2021-12-30 ENCOUNTER — Other Ambulatory Visit: Payer: Self-pay

## 2021-12-30 ENCOUNTER — Other Ambulatory Visit (INDEPENDENT_AMBULATORY_CARE_PROVIDER_SITE_OTHER): Payer: PPO

## 2021-12-30 DIAGNOSIS — R7989 Other specified abnormal findings of blood chemistry: Secondary | ICD-10-CM

## 2021-12-31 LAB — HEPATIC FUNCTION PANEL
ALT: 44 U/L — ABNORMAL HIGH (ref 0–35)
AST: 54 U/L — ABNORMAL HIGH (ref 0–37)
Albumin: 4.5 g/dL (ref 3.5–5.2)
Alkaline Phosphatase: 64 U/L (ref 39–117)
Bilirubin, Direct: 0.2 mg/dL (ref 0.0–0.3)
Total Bilirubin: 0.5 mg/dL (ref 0.2–1.2)
Total Protein: 7.7 g/dL (ref 6.0–8.3)

## 2022-01-01 ENCOUNTER — Telehealth: Payer: Self-pay

## 2022-01-01 ENCOUNTER — Other Ambulatory Visit: Payer: Self-pay

## 2022-01-01 ENCOUNTER — Other Ambulatory Visit: Payer: Self-pay | Admitting: Internal Medicine

## 2022-01-01 DIAGNOSIS — R7989 Other specified abnormal findings of blood chemistry: Secondary | ICD-10-CM

## 2022-01-01 DIAGNOSIS — M25511 Pain in right shoulder: Secondary | ICD-10-CM

## 2022-01-01 NOTE — Telephone Encounter (Signed)
Pt was seen 1/30 and was having rt shoulder pain. Patient stated that xray was offered but patient had to leave to take her husband to the doctor. She would like to come in and have a shoulder xray done. Is this ok with you?

## 2022-01-01 NOTE — Progress Notes (Signed)
Order placed for shoulder xray

## 2022-01-01 NOTE — Telephone Encounter (Signed)
LM to schedule x-ray. Please schedule

## 2022-01-01 NOTE — Telephone Encounter (Signed)
I have placed order for shoulder xray. Ok for her to come in for xray

## 2022-01-01 NOTE — Progress Notes (Signed)
Order placed for abdominal ultrasound.   

## 2022-01-02 ENCOUNTER — Telehealth: Payer: Self-pay | Admitting: Internal Medicine

## 2022-01-02 NOTE — Telephone Encounter (Signed)
Lft pt vm to call ofc to sch US. thanks ?

## 2022-01-02 NOTE — Telephone Encounter (Signed)
I spoke with pt to get scheduled for her [place]. Pt has a question about her thyroid level from [facility] came back that it was fine and that was three days from what Dr Nicki Reaper ordered. Please advise and Thank you!  Call pt @ [phone].

## 2022-01-03 NOTE — Telephone Encounter (Signed)
Spoke with patient. Discussed that thyroid levels can fluctuate as hers did over the last couple of months. WNL now. She has been scheduled for her ultrasound and shoulder x-ray. Patient

## 2022-01-06 ENCOUNTER — Other Ambulatory Visit: Payer: Self-pay

## 2022-01-06 ENCOUNTER — Ambulatory Visit (INDEPENDENT_AMBULATORY_CARE_PROVIDER_SITE_OTHER): Payer: PPO

## 2022-01-06 ENCOUNTER — Other Ambulatory Visit: Payer: PPO

## 2022-01-06 DIAGNOSIS — M25511 Pain in right shoulder: Secondary | ICD-10-CM

## 2022-01-07 ENCOUNTER — Other Ambulatory Visit: Payer: Self-pay

## 2022-01-07 DIAGNOSIS — M25511 Pain in right shoulder: Secondary | ICD-10-CM

## 2022-01-08 ENCOUNTER — Other Ambulatory Visit: Payer: Self-pay

## 2022-01-08 ENCOUNTER — Ambulatory Visit
Admission: RE | Admit: 2022-01-08 | Discharge: 2022-01-08 | Disposition: A | Discharge status: Home / Self Care | Payer: PPO | Source: Ambulatory Visit | Attending: Internal Medicine | Admitting: Internal Medicine

## 2022-01-08 DIAGNOSIS — R7989 Other specified abnormal findings of blood chemistry: Secondary | ICD-10-CM | POA: Insufficient documentation

## 2022-01-08 DIAGNOSIS — K760 Fatty (change of) liver, not elsewhere classified: Secondary | ICD-10-CM | POA: Diagnosis not present

## 2022-01-08 DIAGNOSIS — Z9049 Acquired absence of other specified parts of digestive tract: Secondary | ICD-10-CM | POA: Diagnosis not present

## 2022-01-09 ENCOUNTER — Telehealth: Payer: Self-pay

## 2022-01-09 NOTE — Telephone Encounter (Signed)
Can you resend her referral to [facility] please? I called to schedule her appointment and Megan at [facility] says they do not have referral and did not see in [facility] either.

## 2022-01-13 NOTE — Telephone Encounter (Signed)
Spoke with Chloe Moyer at [facility]. She says they still do not have referral. She is requesting it be sent through [facility] because they have someone that checks it multiple times per day. I tried to schedule the patient myself and offered to fax hard copies of anything needed but was unable to do so. They need referral before they will let me move forward with anything.

## 2022-01-14 ENCOUNTER — Encounter: Payer: Self-pay | Admitting: Internal Medicine

## 2022-01-14 ENCOUNTER — Inpatient Hospital Stay: Payer: PPO

## 2022-01-14 ENCOUNTER — Encounter: Payer: Self-pay | Admitting: Licensed Clinical Social Worker

## 2022-01-14 ENCOUNTER — Inpatient Hospital Stay: Payer: PPO | Attending: Oncology | Admitting: Licensed Clinical Social Worker

## 2022-01-14 ENCOUNTER — Other Ambulatory Visit: Payer: Self-pay

## 2022-01-14 DIAGNOSIS — Z800 Family history of malignant neoplasm of digestive organs: Secondary | ICD-10-CM | POA: Diagnosis not present

## 2022-01-14 DIAGNOSIS — Z803 Family history of malignant neoplasm of breast: Secondary | ICD-10-CM

## 2022-01-14 DIAGNOSIS — Z801 Family history of malignant neoplasm of trachea, bronchus and lung: Secondary | ICD-10-CM

## 2022-01-14 DIAGNOSIS — Z809 Family history of malignant neoplasm, unspecified: Secondary | ICD-10-CM

## 2022-01-14 NOTE — Progress Notes (Signed)
REFERRING PROVIDER: Einar Pheasant, [address] [place],  [place] [address]  PRIMARY PROVIDER:  Einar Pheasant, MD  PRIMARY REASON FOR VISIT:  1. Family history of breast cancer   2. Family history of lung cancer   3. Family history of cancer of mouth   4. Family history of cancer      HISTORY OF PRESENT ILLNESS:   Chloe Moyer, a 71 y.o. female, was seen for a [facility] cancer genetics consultation at the request of Dr. Nicki Reaper due to a family history of cancer.  Chloe Moyer presents to clinic today to discuss the possibility of a hereditary predisposition to cancer, genetic testing, and to further clarify her future cancer risks, as well as potential cancer risks for family members.     Chloe Moyer is a 71 y.o. female with no personal history of cancer.    CANCER HISTORY:  Oncology History   No history exists.     RISK FACTORS:  Menarche was at age 40.  First live birth at age 66.  OCP use for approximately 7 years.  Ovaries intact: yes.  Hysterectomy: no.  Menopausal status: postmenopausal. - 45 HRT use: 0 years. Colonoscopy: yes; normal. Mammogram within the last year: yes. Number of breast biopsies: 1 Up to date with pelvic exams: yes. Any excessive radiation exposure in the past: no  Past Medical History:  Diagnosis Date   Allergic rhinitis    Arthritis    Asthma    Diverticulosis, sigmoid    Family history of breast cancer    Family history of cancer of mouth    Family history of lung cancer    GERD (gastroesophageal reflux disease)    History of hiatal hernia    Hypertension 06/12/2005   Ulcer     Past Surgical History:  Procedure Laterality Date   BREAST BIOPSY Right 09/01/2017   Affirm Bx of 2 areas- both areas were fibroadenomatoid change    BREAST CYST ASPIRATION Left 1990   CARDIAC CATHETERIZATION  July 2012   CHOLECYSTECTOMY  1991   LIPOMA EXCISION  4/99   Left flank area   NASAL SINUS SURGERY  July 2012   TUBAL  LIGATION  1984    Social History   Socioeconomic History   Marital status: Married    Spouse name: Not on file   Number of children: 2   Years of education: Not on file   Highest education level: Not on file  Occupational History   Occupation: Works at Dentist office  Tobacco Use   Smoking status: Never   Smokeless tobacco: Never  Substance and Sexual Activity   Alcohol use: Yes    Alcohol/week: 0.0 standard drinks    Comment: occ wine with dinner   Drug use: No   Sexual activity: Yes  Other Topics Concern   Not on file  Social History Narrative   Not on file   Social Determinants of Health   Financial Resource Strain: Not on file  Food Insecurity: Not on file  Transportation Needs: Not on file  Physical Activity: Not on file  Stress: Not on file  Social Connections: Not on file     FAMILY HISTORY:  We obtained a detailed, 4-generation family history.  Significant diagnoses are listed below: Family History  Problem Relation Age of Onset   Hypertension Mother    Cancer Mother        oral dx 51   Emphysema Father  smoked   Asthma Father    Lung cancer Father        smoked; dx 76s   Hypertension Father    Cancer - Cervical Paternal Aunt    Cancer Paternal Aunt        stomach v. ovarian   Cancer Paternal Uncle        unk, possibly lung   Breast cancer Maternal Grandmother 60   Stroke Maternal Grandfather    Heart disease Paternal Grandfather        myocardial infarction   Cancer Cousin        unk types   Chloe Moyer has 2 daughtes, 33 and 38. She has 1 sister, 77. No known cancers for these individuals.  Chloe Moyer mother died of mouth cancer at 43, she was diagnosed at 47. Patient had 1 maternal uncle, no cancer for him or for his children. Maternal grandmother died in her 29s of metastatic breast cancer. Maternal grandfather died in his 91s of a stroke.  Chloe Moyer father died of lung cancer and COPD at 57. He was diagnosed in his 94s. Patient  had 3 paternal aunts, 3 paternal uncles. One uncle had cancer, unknown type, as did his son, also unknown type. An aunt had cervical in her 58s, and another aunt had either stomach or ovarian cancer and died over age 62. 37 female cousins also had cancers, unsure exact types but possibly lung and mouth. Paternal grandfather and grandmother died over 65, no cancer history.   Chloe Moyer is unaware of previous family history of genetic testing for hereditary cancer risks. Patient's maternal ancestors are of [place] descent, and paternal ancestors are of [place] descent. There is no reported Ashkenazi Jewish ancestry. There is no known consanguinity.    GENETIC COUNSELING ASSESSMENT: Chloe Moyer is a 71 y.o. female with a family history of cancer which is not particularly suggestive of a hereditary cancer syndrome and predisposition to cancer. We, therefore, discussed and recommended the following at today's visit.   DISCUSSION: We discussed that approximately 10% of cancer cancer is hereditary. Most cases of hereditary breast cancer are associated with BRCA1/BRCA2 genes. The other cancers in her family such as lung and mouth cancer are not particularly concerning for a hereditary condition. However, many cancers in her family are unknown, and one aunt may have had ovarian cancer. There are other genes associated with hereditary  cancer as well. Cancers and risks are gene specific. While Chloe Moyer does not meet criteria for testing, she could consider it especially since she is unsure of many cancer types in her family. We discussed that testing is beneficial for several reasons including knowing about other cancer risks, identifying potential screening and risk-reduction options that may be appropriate, and to understand if other family members could be at risk for cancer and allow them to undergo genetic testing.   We reviewed the characteristics, features and inheritance patterns of hereditary cancer  syndromes. We also discussed genetic testing, including the appropriate family members to test, the process of testing, insurance coverage and turn-around-time for results. We discussed the implications of a negative, positive and/or variant of uncertain significant result. Chloe Moyer could consider testing through the [facility] Multi-Cancer+RNA panel, although insurance may not cover this.   The Multi-Cancer Panel + RNA offered by [facility] includes sequencing and/or deletion duplication testing of the following 84 genes: AIP, ALK, APC, ATM, AXIN2,BAP1,  BARD1, BLM, BMPR1A, BRCA1, BRCA2, BRIP1, CASR, CDC73, CDH1, CDK4, CDKN1B, CDKN1C, CDKN2A (p14ARF), CDKN2A (  p16INK4a), CEBPA, CHEK2, CTNNA1, DICER1, DIS3L2, EGFR (c.2369C>T, p.Thr790Met variant only), EPCAM (Deletion/duplication testing only), FH, FLCN, GATA2, GPC3, GREM1 (Promoter region deletion/duplication testing only), HOXB13 (c.251G>A, p.Gly84Glu), HRAS, KIT, MAX, MEN1, MET, MITF (c.952G>A, p.Glu318Lys variant only), MLH1, MSH2, MSH3, MSH6, MUTYH, NBN, NF1, NF2, NTHL1, PALB2, PDGFRA, PHOX2B, PMS2, POLD1, POLE, POT1, PRKAR1A, PTCH1, PTEN, RAD50, RAD51C, RAD51D, RB1, RECQL4, RET, RUNX1, SDHAF2, SDHA (sequence changes only), SDHB, SDHC, SDHD, SMAD4, SMARCA4, SMARCB1, SMARCE1, STK11, SUFU, TERC, TERT, TMEM127, TP53, TSC1, TSC2, VHL, WRN and WT1.  PLAN: After considering the risks, benefits, and limitations, Chloe Moyer provided informed consent to pursue genetic testing and the blood sample was sent to [facility] for analysis of the Multi-Cancer+RNA Panel. Results should be available within approximately 2-3 weeks' time, at which point they will be disclosed by telephone to Chloe Moyer, as will any additional recommendations warranted by these results. Chloe Moyer will receive a summary of her genetic counseling visit and a copy of her results once available. This information will also be available in Epic.   Chloe Moyer questions were answered to her  satisfaction today. Our contact information was provided should additional questions or concerns arise. Thank you for the referral and allowing [place] to share in the care of your patient.   Faith Rogue, MS, [facility] Genetic Counselor [email or web address] Phone: [phone]  The patient was seen for a total of 30 minutes in face-to-face genetic counseling.  Dr. Grayland Ormond was available for discussion regarding this case.   _______________________________________________________________________ For Office Staff:  Number of people involved in session: 1 Was an Intern/ student involved with case: no

## 2022-01-15 NOTE — Telephone Encounter (Signed)
Noted. Will f/u with GI ?

## 2022-01-15 NOTE — Telephone Encounter (Signed)
Noted  

## 2022-01-15 NOTE — Telephone Encounter (Signed)
[name] spoke with pt Chloe Moyer had a appt for tomorrow with Dr [name] but pt has a appt for tomorrow. Pt was offered 3/7 with Dr [name] pt declined wanted Dr [name]. Pt will be called to resch appt once something comes avail cancellation.  ?

## 2022-01-15 NOTE — Telephone Encounter (Signed)
I spoke with Chloe Moyer at [facility] gastro she stated no referral was received the person that does the proficient is not in today and the back up will be in later. I re faxed referral ofc notes ins card to [facility] gastro  ?Faxed to [phone]     ?[email or web address] ?  ?YoungBlood, Rasheedah R on 01/15/2022 1320 - delivered at 01/15/2022 1320 ?  ?And requested a call back once received. ? ?

## 2022-01-16 NOTE — Telephone Encounter (Signed)
Noted.  Just tell her to let us know if she needs anything.  ?

## 2022-01-16 NOTE — Telephone Encounter (Signed)
FYI Dr Nicki Reaper- We have been working with [facility] trying to get patient scheduled. Patient was offered multiple appointments and has declined all of them. She has now been placed on cancellation list for kim and will be called if she has something come available. ?

## 2022-01-17 NOTE — Telephone Encounter (Signed)
Patient aware and says she is scheduled for Wednesday at 9 am ?

## 2022-01-22 ENCOUNTER — Ambulatory Visit: Payer: PPO

## 2022-01-22 ENCOUNTER — Other Ambulatory Visit: Payer: Self-pay | Admitting: Nurse Practitioner

## 2022-01-22 DIAGNOSIS — R1011 Right upper quadrant pain: Secondary | ICD-10-CM | POA: Diagnosis not present

## 2022-01-22 DIAGNOSIS — Z86010 Personal history of colonic polyps: Secondary | ICD-10-CM | POA: Diagnosis not present

## 2022-01-22 DIAGNOSIS — R748 Abnormal levels of other serum enzymes: Secondary | ICD-10-CM | POA: Diagnosis not present

## 2022-01-22 DIAGNOSIS — K7581 Nonalcoholic steatohepatitis (NASH): Secondary | ICD-10-CM | POA: Diagnosis not present

## 2022-01-22 DIAGNOSIS — Z1211 Encounter for screening for malignant neoplasm of colon: Secondary | ICD-10-CM | POA: Diagnosis not present

## 2022-01-23 ENCOUNTER — Other Ambulatory Visit: Payer: Self-pay | Admitting: Internal Medicine

## 2022-01-28 ENCOUNTER — Other Ambulatory Visit: Payer: Self-pay

## 2022-01-28 ENCOUNTER — Ambulatory Visit: Payer: PPO | Attending: Internal Medicine

## 2022-01-28 DIAGNOSIS — M5412 Radiculopathy, cervical region: Secondary | ICD-10-CM | POA: Diagnosis not present

## 2022-01-28 DIAGNOSIS — M25511 Pain in right shoulder: Secondary | ICD-10-CM | POA: Diagnosis not present

## 2022-01-28 NOTE — Therapy (Signed)
[facility] ?[address] ?[place], [place], [address] ?Phone: [phone]   Fax:  [phone] ? ?Physical Therapy Evaluation ? ?Patient Details  ?Name: Chloe Moyer ?MRN: [id number] ?Date of Birth: 07/02/1951 ?Referring Provider (PT): Einar Pheasant, MD ? ? ?Encounter Date: 01/28/2022 ? ? PT End of Session - 01/28/22 0850   ? ? Visit Number 1   ? Number of Visits 17   ? Date for PT Re-Evaluation 03/27/22   ? Authorization Type 1   ? Authorization Time Period of 10 progress report   ? PT Start Time [phone]   ? PT Stop Time 6599   ? PT Time Calculation (min) 42 min   ? Activity Tolerance Patient tolerated treatment well   ? Behavior During Therapy [facility] for tasks assessed/performed   ? ?  ?  ? ?  ? ? ?Past Medical History:  ?Diagnosis Date  ? Allergic rhinitis   ? Arthritis   ? Asthma   ? Diverticulosis, sigmoid   ? Family history of breast cancer   ? Family history of cancer of mouth   ? Family history of lung cancer   ? GERD (gastroesophageal reflux disease)   ? History of hiatal hernia   ? Hypertension 06/12/2005  ? Ulcer   ? ? ?Past Surgical History:  ?Procedure Laterality Date  ? BREAST BIOPSY Right 09/01/2017  ? Affirm Bx of 2 areas- both areas were fibroadenomatoid change   ? BREAST CYST ASPIRATION Left 1990  ? CARDIAC CATHETERIZATION  July 2012  ? CHOLECYSTECTOMY  1991  ? LIPOMA EXCISION  4/99  ? Left flank area  ? NASAL SINUS SURGERY  July 2012  ? TUBAL LIGATION  1984  ? ? ?There were no vitals filed for this visit. ? ? ? Subjective Assessment - 01/28/22 0855   ? ? Subjective R shoulder pain 3/10 currently. 8/10 at worst for the past 2 months. Feels like a nagging headache on her shoulder.   ? Pertinent History R shoulder pain. No known method of injury. Pain at R [facility] join, and posterior medial scapula. Pain began about 3 months ago, gradual onset. Pt also bends over to the side (R lateral trunk side bend) and reaches down with her R arm (R  shoulder abduction) to pick up her yorkie from the floor and bring the dog to her bed. Pt wonders if that contributed to her pain. Pain stayed about the same since onset. Likes to work on her garden.   ? Patient Stated Goals Be able to pick up her dog, make up her bed, don and doff her bra without shoulder pain. Get back to normal.   ? Currently in Pain? Yes   ? Pain Score 3    ? Pain Location Shoulder   ? Pain Descriptors / Indicators Aching;Sharp;Sore   ? Pain Type Acute pain   ? Pain Radiating Towards R medial scapula to mid back.   ? Pain Frequency Occasional   ? Aggravating Factors  Reaching up to an upper cabinet, picking up her dog, reaching behind her to don and doff bra, washing her back, brushing her hair, pulling the bed spread in the morning to make to bed. Pruning   ? Pain Relieving Factors heat, warm shower.   ? ?  ?  ? ?  ? ? ? ? ? [facility] PT Assessment - 01/28/22 0854   ? ?  ? Assessment  ? Medical Diagnosis M25.511 (ICD-10-CM) -  Right shoulder pain, unspecified chronicity   ? Referring Provider (PT) Einar Pheasant, MD   ? Onset Date/Surgical Date 01/07/22   ? Hand Dominance Right   ? Prior Therapy Yes for other body parts with positive results   ?  ? Precautions  ? Precaution Comments No known precautions   ?  ? Restrictions  ? Other Position/Activity Restrictions No known restrictions   ?  ? Balance Screen  ? Has the patient fallen in the past 6 months No   ? Has the patient had a decrease in activity level because of a fear of falling?  No   ? Is the patient reluctant to leave their home because of a fear of falling?  No   ?  ? Prior Function  ? Vocation Retired   ? Leisure Work in her garden   ?  ? Observation/Other Assessments  ? Observations Decreased pain with R shoulder flexion with R scapular retraction with PT assist   ? Focus on Therapeutic Outcomes (FOTO)  R shoulder FOTO 53   ?  ? Posture/Postural Control  ? Posture Comments forward neck, R shoulder higher, slight kyphosis, slight R  lateral shift around L2/3 area, R knee slightly highter. Slight cervical R lateral shift   ?  ? AROM  ? Right Shoulder Flexion 137 Degrees   degrees with R upper trap area pain (152 degrees AAROM)  ? Right Shoulder ABduction 160 Degrees   degrees with slight anterior shoulder pain, 172 degrees AAROM  ? Right Shoulder Internal Rotation --   Functional IR: R thumb to around T9  ? Right Shoulder External Rotation --   Functional ER: R middle finger to T3 transverse process (T2 spinous process area)  ? Cervical Flexion WFL   ? Cervical Extension limited, movement preference around C5/C6 area with reproduction of R AC joint pain.   ? Cervical - Right [facility] with slight R AC joint pain with overpressure   ? Cervical - Left Side Bend WFL with R AC joint/upper trap symptoms   ? Cervical - Right Rotation 62 degrees with R AC joint area symptoms   ? Cervical - Left Rotation 62 degrees   ?  ? Strength  ? Right Shoulder Flexion 4+/5   ? Right Shoulder ABduction 4+/5   ? Right Shoulder Internal Rotation 4/5   ? Right Shoulder External Rotation 4+/5   ? Left Shoulder Flexion 4+/5   ? Left Shoulder ABduction 4+/5   ? Left Shoulder Internal Rotation 4+/5   ? Left Shoulder External Rotation 4/5   ? Right Elbow Flexion 4-/5   ? Right Elbow Extension 4/5   ? Left Elbow Flexion 4-/5   ? Left Elbow Extension 4/5   ? Right Wrist Extension 4/5   ? Left Wrist Extension 4-/5   ?  ? Palpation  ? Palpation comment Muscle tension R upper trap. TTP R levator and acromion area.   ?  ? Special Tests  ? Other special tests (+) [facility]. (-) empty can test.   ? ?  ?  ? ?  ? ? ? ? ? ? ? ? ? ? ? ? ? ?Objective measurements completed on examination: See above findings.  ? ?No latex allergies ? ?Blood pressure is controlled per pt.  ? ? ? ? ? ? ? ? ?[place] ? ? ?Therapeutic exercise ? ?Scapular retraction 10x5 seconds  ? ?Improved exercise technique, movement at target joints, use of target  muscles after mod  verbal, visual, tactile cues.  ? ? ?Response to treatment ?Pt tolerated session well without aggravation of symptoms.  ? ? ?Clinical impression ?Pt is a 71 year old female who came to physical therapy secondary to R shoulder pain. She also presents with altered posture, reproduction of R shoulder pain with cervical and R shoulder AROM, R upper trap muscle tension, positive special test suggesting R shoulder impingement, and difficulty performing tasks which involve reaching up, reaching behind her back, as well as picking up her small dog secondary to pain. Pt will benefit from skilled physical therapy services to address the aforementioned deficits.  ? ? ? ? ? ? ? ? ? ? ? ? ? ? ? ? ? ? ? PT Education - 01/28/22 1304   ? ? Education Details ther-ex, HEP, plan of care   ? Person(s) Educated Patient   ? Methods Explanation;Demonstration;Tactile cues;Verbal cues;Handout   ? Comprehension Returned demonstration;Verbalized understanding   ? ?  ?  ? ?  ? ? ? PT Short Term Goals - 01/28/22 1308   ? ?  ? PT SHORT TERM GOAL #1  ? Title Pt will be independent with her initial HEP to decrease pain, improve strength, function, and ability to reach with less difficulty.   ? Baseline Pt has started her HEP (01/28/2022)   ? Time 3   ? Period Weeks   ? Status New   ? Target Date 02/20/22   ? ?  ?  ? ?  ? ? ? ? PT Long Term Goals - 01/28/22 1309   ? ?  ? PT LONG TERM GOAL #1  ? Title Pt will have a decrease in R shoulder pain to 4/10 or less at worst to promote ability to reach, don and doff clothes, fix her hair as well as pick up her dog with less difficulty.   ? Baseline 8/10 R shoulder pain at worst for the past 2 months (01/28/2022)   ? Time 8   ? Period Weeks   ? Status New   ? Target Date 03/27/22   ?  ? PT LONG TERM GOAL #2  ? Title Pt will improve her shoulder FOTO score by at least 10 points as a demonstration of improved function.   ? Baseline R shoulder FOTO 53 (01/28/2022)   ? Time 8   ? Period Weeks   ? Status New   ?  Target Date 03/27/22   ?  ? PT LONG TERM GOAL #3  ? Title Pt will improve R shoulder flexion AROM to 155 degrees or more to promote ability to reach with less difficulty.   ? Baseline R shoulder flexion AROM 137 degrees

## 2022-01-28 NOTE — Patient Instructions (Signed)
Access Code: [email or web address] ?URL: [email or web address] ?Date: 01/28/2022 ?Prepared by: Joneen Boers ? ?Exercises ?Seated Scapular Retraction - 3 x daily - 7 x weekly - 3 sets - 10 reps - 5 seconds hold ? ?

## 2022-01-31 ENCOUNTER — Other Ambulatory Visit: Payer: Self-pay | Admitting: Internal Medicine

## 2022-02-03 ENCOUNTER — Encounter: Payer: Self-pay | Admitting: Licensed Clinical Social Worker

## 2022-02-03 ENCOUNTER — Telehealth: Payer: Self-pay | Admitting: Licensed Clinical Social Worker

## 2022-02-03 ENCOUNTER — Ambulatory Visit: Payer: Self-pay | Admitting: Licensed Clinical Social Worker

## 2022-02-03 DIAGNOSIS — Z1379 Encounter for other screening for genetic and chromosomal anomalies: Secondary | ICD-10-CM | POA: Insufficient documentation

## 2022-02-03 NOTE — Progress Notes (Signed)
HPI:  [name] was previously seen in the [place] clinic due to a family history of cancer and concerns regarding a hereditary predisposition to cancer. Please refer to our prior cancer genetics clinic note for more information regarding our discussion, assessment and recommendations, at the time. [name] recent genetic test results were disclosed to her, as were recommendations warranted by these results. These results and recommendations are discussed in more detail below. ? ?CANCER HISTORY:  ?Oncology History  ? No history exists.  ? ? ?FAMILY HISTORY:  ?We obtained a detailed, 4-generation family history.  Significant diagnoses are listed below: ?Family History  ?Problem Relation Age of Onset  ? Hypertension Mother   ? Cancer Mother   ?     oral dx 58  ? Emphysema Father   ?     smoked  ? Asthma Father   ? Lung cancer Father   ?     smoked; dx 88s  ? Hypertension Father   ? Cancer - Cervical Paternal Aunt   ? Cancer Paternal Aunt   ?     stomach v. ovarian  ? Cancer Paternal Uncle   ?     unk, possibly lung  ? Breast cancer Maternal Grandmother 60  ? Stroke Maternal Grandfather   ? Heart disease Paternal Grandfather   ?     myocardial infarction  ? Cancer Cousin   ?     unk types  ? ? ?[name] has 2 daughtes, 90 and 38. She has 1 sister, 77. No known cancers for these individuals. ?  ?[name]'s mother died of mouth cancer at 44, she was diagnosed at 37. Patient had 1 maternal uncle, no cancer for him or for his children. Maternal grandmother died in her 38s of metastatic breast cancer. Maternal grandfather died in his 71s of a stroke. ?  ?[name]'s father died of lung cancer and COPD at 61. He was diagnosed in his 97s. Patient had 3 paternal aunts, 3 paternal uncles. One uncle had cancer, unknown type, as did his son, also unknown type. An aunt had cervical in her 34s, and another aunt had either stomach or ovarian cancer and died over age 59. 64 female cousins also had  cancers, unsure exact types but possibly lung and mouth. Paternal grandfather and grandmother died over 53, no cancer history.  ?  ?[name] is unaware of previous family history of genetic testing for hereditary cancer risks. Patient's maternal ancestors are of [place] descent, and paternal ancestors are of [place] descent. There is no reported Ashkenazi Jewish ancestry. There is no known consanguinity. ?  ? ? ?GENETIC TEST RESULTS: Genetic testing reported out on 01/30/2022 through the [facility] Multi-Cancer+RNA cancer panel found no pathogenic mutations.  ? ?The Multi-Cancer Panel + RNA offered by [facility] includes sequencing and/or deletion duplication testing of the following 84 genes: AIP, ALK, APC, ATM, AXIN2,BAP1,  BARD1, BLM, BMPR1A, BRCA1, BRCA2, BRIP1, CASR, CDC73, CDH1, CDK4, CDKN1B, CDKN1C, CDKN2A (p14ARF), CDKN2A (p16INK4a), CEBPA, CHEK2, CTNNA1, DICER1, DIS3L2, EGFR (c.2369C>T, p.Thr790Met variant only), EPCAM (Deletion/duplication testing only), FH, FLCN, GATA2, GPC3, GREM1 (Promoter region deletion/duplication testing only), HOXB13 (c.251G>A, p.Gly84Glu), HRAS, KIT, MAX, MEN1, MET, MITF (c.952G>A, p.Glu318Lys variant only), MLH1, MSH2, MSH3, MSH6, MUTYH, NBN, NF1, NF2, NTHL1, PALB2, PDGFRA, PHOX2B, PMS2, POLD1, POLE, POT1, PRKAR1A, PTCH1, PTEN, RAD50, RAD51C, RAD51D, RB1, RECQL4, RET, RUNX1, SDHAF2, SDHA (sequence changes only), SDHB, SDHC, SDHD, SMAD4, SMARCA4, SMARCB1, SMARCE1, STK11, SUFU, TERC, TERT, TMEM127, TP53, TSC1, TSC2, VHL,  WRN and WT1.  ? ?The test report has been scanned into EPIC and is located under the Molecular Pathology section of the Results Review tab.  A portion of the result report is included below for reference.  ? ? ? ?We discussed that because current genetic testing is not perfect, it is possible there may be a gene mutation in one of these genes that current testing cannot detect, but that chance is small.  There could be another gene that has not yet been discovered, or  that we have not yet tested, that is responsible for the cancer diagnoses in the family. It is also possible there is a hereditary cause for the cancer in the family that [name] did not inherit and therefore was not identified in her testing.  Therefore, it is important to remain in touch with cancer genetics in the future so that we can continue to offer [name] the most up to date genetic testing.  ? ?ADDITIONAL GENETIC TESTING: We discussed with [name] that her genetic testing was fairly extensive.  If there are genes identified to increase cancer risk that can be analyzed in the future, we would be happy to discuss and coordinate this testing at that time.   ? ?CANCER SCREENING RECOMMENDATIONS: [name] test result is considered negative (normal).  This means that we have not identified a hereditary cause for her family history of cancer at this time. ? ?While reassuring, this does not definitively rule out a hereditary predisposition to cancer. It is still possible that there could be genetic mutations that are undetectable by current technology. There could be genetic mutations in genes that have not been tested or identified to increase cancer risk.  Therefore, it is recommended she continue to follow the cancer management and screening guidelines provided by her primary healthcare provider.  ? ?An individual's cancer risk and medical management are not determined by genetic test results alone. Overall cancer risk assessment incorporates additional factors, including personal medical history, family history, and any available genetic information that may result in a personalized plan for cancer prevention and surveillance. ? ?RECOMMENDATIONS FOR FAMILY MEMBERS:  Relatives in this family might be at some increased risk of developing cancer, over the general population risk, simply due to the family history of cancer.  We recommended female relatives in this family have a yearly mammogram  beginning at age 2, or 55 years younger than the earliest onset of cancer, an annual clinical breast exam, and perform monthly breast self-exams. Female relatives in this family should also have a gynecological exam as recommended by their primary provider.  All family members should be referred for colonoscopy starting at age 68.  ? ? It is also possible there is a hereditary cause for the cancer in [name]'s family that she did not inherit and therefore was not identified in her.  Based on [name]'s family history, we recommended paternal relatives have genetic counseling and testing. [name] will let us know if we can be of any assistance in coordinating genetic counseling and/or testing for these family members. ? ?FOLLOW-UP: Lastly, we discussed with [name] that cancer genetics is a rapidly advancing field and it is possible that new genetic tests will be appropriate for her and/or her family members in the future. We encouraged her to remain in contact with cancer genetics on an annual basis so we can update her personal and family histories and let her know of advances in cancer genetics  that may benefit this family.  ? ?Our contact number was provided. [name] questions were answered to her satisfaction, and she knows she is welcome to call us at anytime with additional questions or concerns.  ? ?Faith Rogue, MS, LCGC ?Genetic Counselor ?[email or web address] ?Phone: [phone]' ? ?

## 2022-02-03 NOTE — Telephone Encounter (Signed)
Revealed negative genetic testing.  This normal result is reassuring.  It is unlikely that there is an increased risk of cancer due to a mutation in one of these genes.  However, genetic testing is not perfect, and cannot definitively rule out a hereditary cause.  It will be important for her to keep in contact with genetics to learn if any additional testing may be needed in the future.      

## 2022-02-04 ENCOUNTER — Other Ambulatory Visit: Payer: Self-pay

## 2022-02-04 ENCOUNTER — Ambulatory Visit: Payer: PPO

## 2022-02-04 DIAGNOSIS — M25511 Pain in right shoulder: Secondary | ICD-10-CM | POA: Diagnosis not present

## 2022-02-04 DIAGNOSIS — M5412 Radiculopathy, cervical region: Secondary | ICD-10-CM

## 2022-02-04 NOTE — Patient Instructions (Addendum)
Access Code: [email or web address] ?URL: [email or web address] ?Date: 02/04/2022 ?Prepared by: Joneen Boers ? ?Exercises ?Seated Scapular Retraction - 3 x daily - 7 x weekly - 3 sets - 10 reps - 5 seconds hold ?Seated Thoracic Lumbar Extension - 1 x daily - 7 x weekly - 3 sets - 10 reps - 5 seconds hold ?Seated Cervical Retraction - 1 x daily - 7 x weekly - 3 sets - 10 reps - 5 seconds hold ? ? ?

## 2022-02-04 NOTE — Therapy (Signed)
?OUTPATIENT PHYSICAL THERAPY TREATMENT NOTE ? ? ?Patient Name: Chloe Moyer ?MRN: [id number] ?DOB:1951/02/27, 71 y.o., female ?Today's Date: 02/04/2022 ? ?PCP: Einar Pheasant, MD ?REFERRING PROVIDER: Einar Pheasant, MD ? ? PT End of Session - 02/04/22 1506   ? ? Visit Number 2   ? Number of Visits 17   ? Date for PT Re-Evaluation 03/27/22   ? Authorization Type 2   ? Authorization Time Period of 10 progress report   ? PT Start Time 1506   ? PT Stop Time 2536   ? PT Time Calculation (min) 39 min   ? Activity Tolerance Patient tolerated treatment well   ? Behavior During Therapy [facility] for tasks assessed/performed   ? ?  ?  ? ?  ? ? ?Past Medical History:  ?Diagnosis Date  ? Allergic rhinitis   ? Arthritis   ? Asthma   ? Diverticulosis, sigmoid   ? Family history of breast cancer   ? Family history of cancer of mouth   ? Family history of lung cancer   ? GERD (gastroesophageal reflux disease)   ? History of hiatal hernia   ? Hypertension 06/12/2005  ? Ulcer   ? ?Past Surgical History:  ?Procedure Laterality Date  ? BREAST BIOPSY Right 09/01/2017  ? Affirm Bx of 2 areas- both areas were fibroadenomatoid change   ? BREAST CYST ASPIRATION Left 1990  ? CARDIAC CATHETERIZATION  July 2012  ? CHOLECYSTECTOMY  1991  ? LIPOMA EXCISION  4/99  ? Left flank area  ? NASAL SINUS SURGERY  July 2012  ? TUBAL LIGATION  1984  ? ?Patient Active Problem List  ? Diagnosis Date Noted  ? Genetic testing 02/03/2022  ? Family history of breast cancer 01/14/2022  ? Family history of lung cancer 01/14/2022  ? Family history of cancer of mouth 01/14/2022  ? Family history of cancer 12/29/2021  ? Right shoulder pain 12/29/2021  ? Low back pain 11/07/2020  ? Dysuria 10/24/2020  ? Pain, dental 09/29/2020  ? Hypercholesterolemia 04/08/2020  ? Left knee pain 04/08/2020  ? COVID-19 virus infection 10/31/2019  ? Stress 09/25/2019  ? Joint stiffness of hand 06/26/2018  ? Plantar fasciitis 06/26/2018  ? Left leg pain 02/21/2018  ? Hyperglycemia  02/21/2018  ? Visual disturbance 02/20/2016  ? Near syncope 02/20/2016  ? Right lower quadrant pain 08/04/2015  ? Abnormal liver function tests 08/04/2015  ? UTI symptoms 03/31/2015  ? Health care maintenance 02/03/2015  ? Cough 12/21/2014  ? History of colonic polyps 05/29/2014  ? Leukopenia 07/07/2013  ? Asthma 10/10/2012  ? Allergic rhinitis 10/10/2012  ? GERD (gastroesophageal reflux disease) 10/10/2012  ? Hypertension 10/10/2012  ? ? ?REFERRING DIAG: M25.511 (ICD-10-CM) - Right shoulder pain, unspecified chronicity ? ?THERAPY DIAG:  ?Acute pain of right shoulder ? ?Radiculopathy, cervical region ? ?PERTINENT HISTORY: R shoulder pain. No known method of injury. Pain at R [facility] join, and posterior medial scapula. Pain began about 3 months ago, gradual onset. Pt also bends over to the side (R lateral trunk side bend) and reaches down with her R arm (R shoulder abduction) to pick up her yorkie from the floor and bring the dog to her bed. Pt wonders if that contributed to her pain. Pain stayed about the same since onset. Likes to work on her garden. ? ?PRECAUTIONS: No known precautions ? ?SUBJECTIVE: R shoulder is feeling better. Has been doing her HEP. Was able to move around in bed yesterday without so much pain. 2/10 soreness  currently ? ?PAIN:  ?Are you having pain? Yes: NPRS scale: 2/10 ?Pain location: R shoulder ?Pain description: soreness ? ? ? ? ? ?Objective  ? ?02/04/2022 ? ?   ?Therapeutic exercise ?  ?Seated manually resisted scapular retraction targeting the lower trap  ? R 10x5 seconds for 3 sets ? ?Seated thoracic extension stretch over chair to promote scapular retraction and decrease lower cervical extension stress 10x5 seconds for 3 sets ? ?Seated chin tucks 10x5 seconds for 3 sets ? ?Seated R first rib stretch with PT manual stabilization of R first rib 20 seconds x 5 ? Felt good per pt.  ? ?Seated R scapular depression isometrics 10x5 seconds, forearm on arm rest for 3 sets ? ? ? ? ?Improved exercise  technique, movement at target joints, use of target muscles after mod verbal, visual, tactile cues.  ?  ? Manual therapy  ?Seated STM B cervical paraspinal muscles and R upper trap muscle to decrease tension  ? ? ? ? ?Response to treatment ?Pt tolerated session well without aggravation of symptoms.  ?  ?  ?Clinical impression ?Worked on improving lower trap strength as well as improving mid to upper thoracic extension to decrease stress to lower cervical spine as well as improve scapular mechanics with arm movements. Pt tolerated session well without aggravation of symptoms. Pt will benefit from continued skilled physical therapy services to decrease pain, improve strength, ROM, and function.  ? ? ?PATIENT EDUCATION: ?Education details: there-ex, HEP ?Person educated: Patient ?Education method: Explanation, Demonstration, Tactile cues, Verbal cues, and Handouts ?Education comprehension: verbalized understanding and returned demonstration ? ? ?HOME EXERCISE PROGRAM: ?Access Code: [email or web address] ?URL: [email or web address] ?Date: 02/04/2022 ?Prepared by: Joneen Boers ? ?Exercises ?Seated Scapular Retraction - 3 x daily - 7 x weekly - 3 sets - 10 reps - 5 seconds hold ?Seated Thoracic Lumbar Extension - 1 x daily - 7 x weekly - 3 sets - 10 reps - 5 seconds hold ?Seated Cervical Retraction - 1 x daily - 7 x weekly - 3 sets - 10 reps - 5 seconds hold ? ? ? ? ? ? PT Short Term Goals   ? ?  ? PT SHORT TERM GOAL #1  ? Title Pt will be independent with her initial HEP to decrease pain, improve strength, function, and ability to reach with less difficulty.   ? Baseline Pt has started her HEP (01/28/2022)   ? Time 3   ? Period Weeks   ? Status New   ? Target Date 02/20/22   ? ?  ?  ? ?  ? ? ? PT Long Term Goals  ? ?  ? PT LONG TERM GOAL #1  ? Title Pt will have a decrease in R shoulder pain to 4/10 or less at worst to promote ability to reach, don and doff clothes, fix her hair as well as pick up her dog with less  difficulty.   ? Baseline 8/10 R shoulder pain at worst for the past 2 months (01/28/2022)   ? Time 8   ? Period Weeks   ? Status New   ? Target Date 03/27/22   ?  ? PT LONG TERM GOAL #2  ? Title Pt will improve her shoulder FOTO score by at least 10 points as a demonstration of improved function.   ? Baseline R shoulder FOTO 53 (01/28/2022)   ? Time 8   ? Period Weeks   ? Status New   ? Target Date 03/27/22   ?  ?  PT LONG TERM GOAL #3  ? Title Pt will improve R shoulder flexion AROM to 155 degrees or more to promote ability to reach with less difficulty.   ? Baseline R shoulder flexion AROM 137 degrees with R upper trap pain (01/28/2022)   ? Time 8   ? Period Weeks   ? Status New   ? Target Date 03/27/22   ?  ? PT LONG TERM GOAL #4  ? Title Pt will improve R functional IR AROM to R thumb to T6 spinous process to promote ability to don and doff clothes more comfortably.   ? Baseline R functional IR AROM R thumb to T9 spinous process. (01/28/2022)   ? Time 8   ? Period Weeks   ? Status New   ? Target Date 03/27/22   ? ?  ?  ? ?  ? ? ? Plan   ? ? Clinical Impression Statement Worked on improving lower trap strength as well as improving mid to upper thoracic extension to decrease stress to lower cervical spine as well as improve scapular mechanics with arm movements. Pt tolerated session well without aggravation of symptoms. Pt will benefit from continued skilled physical therapy services to decrease pain, improve strength, ROM, and function.   ? Personal Factors and Comorbidities Age;Comorbidity 3+;Fitness   ? Comorbidities Arthritis, asthma, HTN   ? Examination-Activity Limitations Bathing;Reach Overhead;Dressing;Hygiene/Grooming;Lift   ? Stability/Clinical Decision Making Stable/Uncomplicated   ? Rehab Potential Fair   ? PT Frequency 2x / week   ? PT Duration 8 weeks   ? PT Treatment/Interventions Therapeutic exercise;Therapeutic activities;Manual techniques;Electrical Stimulation;Iontophoresis '4mg'$ /ml  Dexamethasone;Neuromuscular re-education;Patient/family education;Dry needling   ? PT Next Visit Plan posture, scapular strengthening, thoracic extension, shoulder strengthening, mehcanics, manual techniques, modalities PRN   ? PT

## 2022-02-06 ENCOUNTER — Other Ambulatory Visit: Payer: Self-pay

## 2022-02-06 ENCOUNTER — Ambulatory Visit
Admission: RE | Admit: 2022-02-06 | Discharge: 2022-02-06 | Disposition: A | Discharge status: Home / Self Care | Payer: PPO | Source: Ambulatory Visit | Attending: Nurse Practitioner | Admitting: Nurse Practitioner

## 2022-02-06 ENCOUNTER — Ambulatory Visit: Payer: PPO

## 2022-02-06 DIAGNOSIS — K7581 Nonalcoholic steatohepatitis (NASH): Secondary | ICD-10-CM | POA: Diagnosis not present

## 2022-02-06 DIAGNOSIS — R945 Abnormal results of liver function studies: Secondary | ICD-10-CM | POA: Diagnosis not present

## 2022-02-06 DIAGNOSIS — R1011 Right upper quadrant pain: Secondary | ICD-10-CM | POA: Diagnosis not present

## 2022-02-06 DIAGNOSIS — R160 Hepatomegaly, not elsewhere classified: Secondary | ICD-10-CM | POA: Diagnosis not present

## 2022-02-06 DIAGNOSIS — R748 Abnormal levels of other serum enzymes: Secondary | ICD-10-CM | POA: Insufficient documentation

## 2022-02-06 DIAGNOSIS — K449 Diaphragmatic hernia without obstruction or gangrene: Secondary | ICD-10-CM | POA: Diagnosis not present

## 2022-02-06 LAB — POCT I-STAT CREATININE: Creatinine, Ser: 0.8 mg/dL (ref 0.44–1.00)

## 2022-02-06 MED ORDER — IOHEXOL 300 MG/ML  SOLN
100.0000 mL | Freq: Once | INTRAMUSCULAR | Status: AC | PRN
  Administered 2022-02-06: 100 mL via INTRAVENOUS

## 2022-02-10 ENCOUNTER — Other Ambulatory Visit: Payer: Self-pay

## 2022-02-10 ENCOUNTER — Ambulatory Visit: Payer: PPO

## 2022-02-10 DIAGNOSIS — M5412 Radiculopathy, cervical region: Secondary | ICD-10-CM

## 2022-02-10 DIAGNOSIS — M25511 Pain in right shoulder: Secondary | ICD-10-CM | POA: Diagnosis not present

## 2022-02-10 NOTE — Therapy (Signed)
?OUTPATIENT PHYSICAL THERAPY TREATMENT NOTE ? ? ?Patient Name: Chloe Moyer ?MRN: [id number] ?DOB:[birth year], 71 y.o., female ?Today's Date: 02/10/2022 ? ?PCP: Einar Pheasant, MD ?REFERRING PROVIDER: Einar Pheasant, MD ? ? PT End of Session - 02/10/22 1419   ? ? Visit Number 3   ? Number of Visits 17   ? Date for PT Re-Evaluation 03/27/22   ? Authorization Type 3   ? Authorization Time Period of 10 progress report   ? PT Start Time 1419   ? PT Stop Time 1500   ? PT Time Calculation (min) 41 min   ? Activity Tolerance Patient tolerated treatment well   ? Behavior During Therapy [facility] for tasks assessed/performed   ? ?  ?  ? ?  ? ? ? ?Past Medical History:  ?Diagnosis Date  ? Allergic rhinitis   ? Arthritis   ? Asthma   ? Diverticulosis, sigmoid   ? Family history of breast cancer   ? Family history of cancer of mouth   ? Family history of lung cancer   ? GERD (gastroesophageal reflux disease)   ? History of hiatal hernia   ? Hypertension 06/12/2005  ? Ulcer   ? ?Past Surgical History:  ?Procedure Laterality Date  ? BREAST BIOPSY Right 09/01/2017  ? Affirm Bx of 2 areas- both areas were fibroadenomatoid change   ? BREAST CYST ASPIRATION Left 1990  ? CARDIAC CATHETERIZATION  July 2012  ? CHOLECYSTECTOMY  1991  ? LIPOMA EXCISION  4/99  ? Left flank area  ? NASAL SINUS SURGERY  July 2012  ? TUBAL LIGATION  1984  ? ?Patient Active Problem List  ? Diagnosis Date Noted  ? Genetic testing 02/03/2022  ? Family history of breast cancer 01/14/2022  ? Family history of lung cancer 01/14/2022  ? Family history of cancer of mouth 01/14/2022  ? Family history of cancer 12/29/2021  ? Right shoulder pain 12/29/2021  ? Low back pain 11/07/2020  ? Dysuria 10/24/2020  ? Pain, dental 09/29/2020  ? Hypercholesterolemia 04/08/2020  ? Left knee pain 04/08/2020  ? COVID-19 virus infection 10/31/2019  ? Stress 09/25/2019  ? Joint stiffness of hand 06/26/2018  ? Plantar fasciitis 06/26/2018  ? Left leg pain 02/21/2018  ? Hyperglycemia  02/21/2018  ? Visual disturbance 02/20/2016  ? Near syncope 02/20/2016  ? Right lower quadrant pain 08/04/2015  ? Abnormal liver function tests 08/04/2015  ? UTI symptoms 03/31/2015  ? Health care maintenance 02/03/2015  ? Cough 12/21/2014  ? History of colonic polyps 05/29/2014  ? Leukopenia 07/07/2013  ? Asthma 10/10/2012  ? Allergic rhinitis 10/10/2012  ? GERD (gastroesophageal reflux disease) 10/10/2012  ? Hypertension 10/10/2012  ? ? ?REFERRING DIAG: M25.511 (ICD-10-CM) - Right shoulder pain, unspecified chronicity ? ?THERAPY DIAG:  ?Acute pain of right shoulder ? ?Radiculopathy, cervical region ? ?PERTINENT HISTORY: R shoulder pain. No known method of injury. Pain at R [facility] join, and posterior medial scapula. Pain began about 3 months ago, gradual onset. Pt also bends over to the side (R lateral trunk side bend) and reaches down with her R arm (R shoulder abduction) to pick up her yorkie from the floor and bring the dog to her bed. Pt wonders if that contributed to her pain. Pain stayed about the same since onset. Likes to work on her garden. ? ?PRECAUTIONS: No known precautions ? ?SUBJECTIVE: Doing better. Does not bother her as much at night.  ? ?PAIN:  ?Are you having pain? Yes: NPRS scale: 2/10 ?Pain  location: R shoulder ?Pain description: soreness ? ? ? ? ? ?Objective  ? ?02/10/2022 ? ?   ?Therapeutic exercise ?  ?Seated manually resisted scapular retraction targeting the lower trap  ? R 10x5 seconds for 3 sets ? ?R shoulder ER yellow band 10x2 ? ?Seated thoracic extension stretch over chair to promote scapular retraction and decrease lower cervical extension stress 10x5 seconds for 3 sets ? ?Seated R first rib stretch 20 seconds x 5 with strap ? ?Seated chin tucks 10x5 seconds for 3 sets ? ?Seated R scapular depression isometrics 5x10 seconds for 2 sets ? ? ?Improved exercise technique, movement at target joints, use of target muscles after mod verbal, visual, tactile cues.  ?  ? Manual therapy ?Seated STM  B cervical paraspinal muscles and R upper trap muscle to decrease tension  ? ? ? ?Response to treatment ?Pt tolerated session well without aggravation of symptoms. R shoulder feels better after session per pt.  ?  ?  ?Clinical impression ?Decreasing R shoulder pain based on subjective reports. Continued working on improving scapular and ER muscle strength as well as thoracic extension to decrease stress to lower cervical spine to decrease R shoulder radiating symptoms, improve scapular mechanics to also decrease impingement. Pt tolerated session well without aggravation of symptoms. Pt will benefit from continued skilled physical therapy services to decrease pain, improve strength, ROM, and function.  ? ? ?PATIENT EDUCATION: ?Education details: there-ex, HEP ?Person educated: Patient ?Education method: Explanation, Demonstration, Tactile cues, Verbal cues, and Handouts ?Education comprehension: verbalized understanding and returned demonstration ? ? ?HOME EXERCISE PROGRAM: ?Access Code: [email or web address] ?URL: [email or web address] ?Date: 02/10/2022 ?Prepared by: Joneen Boers ? ?Exercises ?- Seated Scapular Retraction  - 3 x daily - 7 x weekly - 3 sets - 10 reps - 5 seconds hold ?- Seated Thoracic Lumbar Extension  - 1 x daily - 7 x weekly - 3 sets - 10 reps - 5 seconds hold ?- Seated Cervical Retraction  - 1 x daily - 7 x weekly - 3 sets - 10 reps - 5 seconds hold ?- Standing Shoulder External Rotation with Resistance  - 1 x daily - 7 x weekly - 2 sets - 10 reps ? ? ? ? ? ? PT Short Term Goals   ? ?  ? PT SHORT TERM GOAL #1  ? Title Pt will be independent with her initial HEP to decrease pain, improve strength, function, and ability to reach with less difficulty.   ? Baseline Pt has started her HEP (01/28/2022)   ? Time 3   ? Period Weeks   ? Status New   ? Target Date 02/20/22   ? ?  ?  ? ?  ? ? ? PT Long Term Goals  ? ?  ? PT LONG TERM GOAL #1  ? Title Pt will have a decrease in R shoulder pain to 4/10 or  less at worst to promote ability to reach, don and doff clothes, fix her hair as well as pick up her dog with less difficulty.   ? Baseline 8/10 R shoulder pain at worst for the past 2 months (01/28/2022)   ? Time 8   ? Period Weeks   ? Status New   ? Target Date 03/27/22   ?  ? PT LONG TERM GOAL #2  ? Title Pt will improve her shoulder FOTO score by at least 10 points as a demonstration of improved function.   ? Baseline R shoulder FOTO 53 (01/28/2022)   ?  Time 8   ? Period Weeks   ? Status New   ? Target Date 03/27/22   ?  ? PT LONG TERM GOAL #3  ? Title Pt will improve R shoulder flexion AROM to 155 degrees or more to promote ability to reach with less difficulty.   ? Baseline R shoulder flexion AROM 137 degrees with R upper trap pain (01/28/2022)   ? Time 8   ? Period Weeks   ? Status New   ? Target Date 03/27/22   ?  ? PT LONG TERM GOAL #4  ? Title Pt will improve R functional IR AROM to R thumb to T6 spinous process to promote ability to don and doff clothes more comfortably.   ? Baseline R functional IR AROM R thumb to T9 spinous process. (01/28/2022)   ? Time 8   ? Period Weeks   ? Status New   ? Target Date 03/27/22   ? ?  ?  ? ?  ? ? ? Plan   ? ? Clinical Impression Statement Decreasing R shoulder pain based on subjective reports. Continued working on improving scapular and ER muscle strength as well as thoracic extension to decrease stress to lower cervical spine to decrease R shoulder radiating symptoms, improve scapular mechanics to also decrease impingement. Pt tolerated session well without aggravation of symptoms. Pt will benefit from continued skilled physical therapy services to decrease pain, improve strength, ROM, and function.   ? Personal Factors and Comorbidities Age;Comorbidity 3+;Fitness   ? Comorbidities Arthritis, asthma, HTN   ? Examination-Activity Limitations Bathing;Reach Overhead;Dressing;Hygiene/Grooming;Lift   ? Stability/Clinical Decision Making Stable/Uncomplicated   ? Rehab Potential  Fair   ? PT Frequency 2x / week   ? PT Duration 8 weeks   ? PT Treatment/Interventions Therapeutic exercise;Therapeutic activities;Manual techniques;Electrical Stimulation;Iontophoresis '4mg'$ /ml Dexamethasone;

## 2022-02-12 ENCOUNTER — Ambulatory Visit: Payer: PPO

## 2022-02-12 DIAGNOSIS — M25511 Pain in right shoulder: Secondary | ICD-10-CM | POA: Diagnosis not present

## 2022-02-12 DIAGNOSIS — M5412 Radiculopathy, cervical region: Secondary | ICD-10-CM

## 2022-02-12 NOTE — Therapy (Signed)
?OUTPATIENT PHYSICAL THERAPY TREATMENT NOTE ? ? ?Patient Name: Chloe Moyer ?MRN: [id number] ?DOB:1951-07-24, 71 y.o., female ?Today's Date: 02/12/2022 ? ?PCP: Einar Pheasant, MD ?REFERRING PROVIDER: Einar Pheasant, MD ? ? PT End of Session - 02/12/22 1504   ? ? Visit Number 4   ? Number of Visits 17   ? Date for PT Re-Evaluation 03/27/22   ? Authorization Type 4   ? Authorization Time Period of 10 progress report   ? PT Start Time 0263   ? PT Stop Time 1543   ? PT Time Calculation (min) 39 min   ? Activity Tolerance Patient tolerated treatment well   ? Behavior During Therapy [facility] for tasks assessed/performed   ? ?  ?  ? ?  ? ? ? ? ?Past Medical History:  ?Diagnosis Date  ? Allergic rhinitis   ? Arthritis   ? Asthma   ? Diverticulosis, sigmoid   ? Family history of breast cancer   ? Family history of cancer of mouth   ? Family history of lung cancer   ? GERD (gastroesophageal reflux disease)   ? History of hiatal hernia   ? Hypertension 06/12/2005  ? Ulcer   ? ?Past Surgical History:  ?Procedure Laterality Date  ? BREAST BIOPSY Right 09/01/2017  ? Affirm Bx of 2 areas- both areas were fibroadenomatoid change   ? BREAST CYST ASPIRATION Left 1990  ? CARDIAC CATHETERIZATION  July 2012  ? CHOLECYSTECTOMY  1991  ? LIPOMA EXCISION  4/99  ? Left flank area  ? NASAL SINUS SURGERY  July 2012  ? TUBAL LIGATION  1984  ? ?Patient Active Problem List  ? Diagnosis Date Noted  ? Genetic testing 02/03/2022  ? Family history of breast cancer 01/14/2022  ? Family history of lung cancer 01/14/2022  ? Family history of cancer of mouth 01/14/2022  ? Family history of cancer 12/29/2021  ? Right shoulder pain 12/29/2021  ? Low back pain 11/07/2020  ? Dysuria 10/24/2020  ? Pain, dental 09/29/2020  ? Hypercholesterolemia 04/08/2020  ? Left knee pain 04/08/2020  ? COVID-19 virus infection 10/31/2019  ? Stress 09/25/2019  ? Joint stiffness of hand 06/26/2018  ? Plantar fasciitis 06/26/2018  ? Left leg pain 02/21/2018  ? Hyperglycemia  02/21/2018  ? Visual disturbance 02/20/2016  ? Near syncope 02/20/2016  ? Right lower quadrant pain 08/04/2015  ? Abnormal liver function tests 08/04/2015  ? UTI symptoms 03/31/2015  ? Health care maintenance 02/03/2015  ? Cough 12/21/2014  ? History of colonic polyps 05/29/2014  ? Leukopenia 07/07/2013  ? Asthma 10/10/2012  ? Allergic rhinitis 10/10/2012  ? GERD (gastroesophageal reflux disease) 10/10/2012  ? Hypertension 10/10/2012  ? ? ?REFERRING DIAG: M25.511 (ICD-10-CM) - Right shoulder pain, unspecified chronicity ? ?THERAPY DIAG:  ?Acute pain of right shoulder ? ?Radiculopathy, cervical region ? ?PERTINENT HISTORY: R shoulder pain. No known method of injury. Pain at R [facility] join, and posterior medial scapula. Pain began about 3 months ago, gradual onset. Pt also bends over to the side (R lateral trunk side bend) and reaches down with her R arm (R shoulder abduction) to pick up her yorkie from the floor and bring the dog to her bed. Pt wonders if that contributed to her pain. Pain stayed about the same since onset. Likes to work on her garden. ? ?PRECAUTIONS: No known precautions ? ?SUBJECTIVE: R shoulder is getting there, its better. 2-3/10 at worst for the past 7 days ? ?PAIN:  ?Are you having pain?  Yes: NPRS scale: 0/10 ?Pain location: R shoulder ?Pain description: soreness ? ? ? ? ? ?Objective  ? ?02/12/2022 ? ?   ?Therapeutic exercise ? ?Standing R shoulder AROM  ? Flexion 141 degrees  ? Functional R shoulder IR: R thumb to T6 spinous process ? ?Standing R hand press onto abdomen to improve infraspinatus muscle use 10x5 seconds for 3 sets ? ? Improved R shoulder functional IR afterwards.  ? ? ?R shoulder ER yellow band 10x3 ? ?Seated chin tucks 10x5 seconds for 3 sets ? ?Seated thoracic extension stretch over chair to promote scapular retraction and decrease lower cervical extension stress 10x5 seconds for 3 sets ? ?Seated R first rib stretch 20 seconds x 5 with strap ? ?Seated manually resisted scapular  retraction targeting the lower trap  ? R 10x5 seconds for 3 sets ? ? ? ? ? ?Improved exercise technique, movement at target joints, use of target muscles after mod verbal, visual, tactile cues.  ?  ? Manual therapy  ?Seated STM B cervical paraspinal muscles and R upper trap muscle to decrease tension  ? ? ? ?Response to treatment ?Pt tolerated session well without aggravation of symptoms.  ?  ?Clinical impression ?Decreasing R shoulder pain based on subjective reports with worst pain being 2-3/10 for the past 7 days. Continued working on improving thoracic extension and scapular strength to decrease stress to lower cervical spine and R anterior shoulder. Pt tolerated session well without aggravation of symptoms. Pt will benefit from continued skilled physical therapy services to decrease pain, improve strength, ROM, and function.  ? ? ?PATIENT EDUCATION: ?Education details: there-ex, HEP ?Person educated: Patient ?Education method: Explanation, Demonstration, Tactile cues, Verbal cues, and Handouts ?Education comprehension: verbalized understanding and returned demonstration ? ? ?HOME EXERCISE PROGRAM: ?Access Code: [email or web address] ?URL: [email or web address] ?Date: 02/12/2022 ?Prepared by: Joneen Boers ? ?Exercises ?- Seated Scapular Retraction  - 3 x daily - 7 x weekly - 3 sets - 10 reps - 5 seconds hold ?- Seated Thoracic Lumbar Extension  - 1 x daily - 7 x weekly - 3 sets - 10 reps - 5 seconds hold ?- Seated Cervical Retraction  - 1 x daily - 7 x weekly - 3 sets - 10 reps - 5 seconds hold ?- Standing Shoulder External Rotation with Resistance  - 1 x daily - 7 x weekly - 2 sets - 10 reps ?- First Rib Mobilization with Strap  - 3 x daily - 7 x weekly - 1 sets - 5 reps - 20 seconds hold ? ? ? ? ? ? PT Short Term Goals   ? ?  ? PT SHORT TERM GOAL #1  ? Title Pt will be independent with her initial HEP to decrease pain, improve strength, function, and ability to reach with less difficulty.   ? Baseline Pt has  started her HEP (01/28/2022)   ? Time 3   ? Period Weeks   ? Status New   ? Target Date 02/20/22   ? ?  ?  ? ?  ? ? ? PT Long Term Goals  ? ?  ? PT LONG TERM GOAL #1  ? Title Pt will have a decrease in R shoulder pain to 4/10 or less at worst to promote ability to reach, don and doff clothes, fix her hair as well as pick up her dog with less difficulty.   ? Baseline 8/10 R shoulder pain at worst for the past 2 months (01/28/2022)   ? Time 8   ?  Period Weeks   ? Status New   ? Target Date 03/27/22   ?  ? PT LONG TERM GOAL #2  ? Title Pt will improve her shoulder FOTO score by at least 10 points as a demonstration of improved function.   ? Baseline R shoulder FOTO 53 (01/28/2022)   ? Time 8   ? Period Weeks   ? Status New   ? Target Date 03/27/22   ?  ? PT LONG TERM GOAL #3  ? Title Pt will improve R shoulder flexion AROM to 155 degrees or more to promote ability to reach with less difficulty.   ? Baseline R shoulder flexion AROM 137 degrees with R upper trap pain (01/28/2022)   ? Time 8   ? Period Weeks   ? Status New   ? Target Date 03/27/22   ?  ? PT LONG TERM GOAL #4  ? Title Pt will improve R functional IR AROM to R thumb to T6 spinous process to promote ability to don and doff clothes more comfortably.   ? Baseline R functional IR AROM R thumb to T9 spinous process. (01/28/2022)   ? Time 8   ? Period Weeks   ? Status New   ? Target Date 03/27/22   ? ?  ?  ? ?  ? ? ? Plan   ? ? Clinical Impression Statement   ? Personal Factors and Comorbidities Age;Comorbidity 3+;Fitness   ? Comorbidities Arthritis, asthma, HTN   ? Examination-Activity Limitations Bathing;Reach Overhead;Dressing;Hygiene/Grooming;Lift   ? Stability/Clinical Decision Making Stable/Uncomplicated   ? Rehab Potential Fair   ? PT Frequency 2x / week   ? PT Duration 8 weeks   ? PT Treatment/Interventions Therapeutic exercise;Therapeutic activities;Manual techniques;Electrical Stimulation;Iontophoresis '4mg'$ /ml Dexamethasone;Neuromuscular  re-education;Patient/family education;Dry needling   ? PT Next Visit Plan posture, scapular strengthening, thoracic extension, shoulder strengthening, mehcanics, manual techniques, modalities PRN   ? PT Home Exercise Plan [facility]

## 2022-02-17 ENCOUNTER — Ambulatory Visit: Payer: PPO | Attending: Internal Medicine

## 2022-02-17 DIAGNOSIS — M5412 Radiculopathy, cervical region: Secondary | ICD-10-CM | POA: Insufficient documentation

## 2022-02-17 DIAGNOSIS — M25511 Pain in right shoulder: Secondary | ICD-10-CM | POA: Insufficient documentation

## 2022-02-17 NOTE — Therapy (Signed)
?OUTPATIENT PHYSICAL THERAPY TREATMENT NOTE ? ? ?Patient Name: Chloe Moyer ?MRN: [id number] ?DOB:May 28, 1951, 71 y.o., female ?Today's Date: 02/17/2022 ? ?PCP: Einar Pheasant, MD ?REFERRING PROVIDER: Einar Pheasant, MD ? ? PT End of Session - 02/17/22 1330   ? ? Visit Number 5   ? Number of Visits 17   ? Date for PT Re-Evaluation 03/27/22   ? Authorization Type 5   ? Authorization Time Period of 10 progress report   ? PT Start Time 1331   ? PT Stop Time 1412   ? PT Time Calculation (min) 41 min   ? Activity Tolerance Patient tolerated treatment well   ? Behavior During Therapy [facility] for tasks assessed/performed   ? ?  ?  ? ?  ? ? ? ? ? ?Past Medical History:  ?Diagnosis Date  ? Allergic rhinitis   ? Arthritis   ? Asthma   ? Diverticulosis, sigmoid   ? Family history of breast cancer   ? Family history of cancer of mouth   ? Family history of lung cancer   ? GERD (gastroesophageal reflux disease)   ? History of hiatal hernia   ? Hypertension 06/12/2005  ? Ulcer   ? ?Past Surgical History:  ?Procedure Laterality Date  ? BREAST BIOPSY Right 09/01/2017  ? Affirm Bx of 2 areas- both areas were fibroadenomatoid change   ? BREAST CYST ASPIRATION Left 1990  ? CARDIAC CATHETERIZATION  July 2012  ? CHOLECYSTECTOMY  1991  ? LIPOMA EXCISION  4/99  ? Left flank area  ? NASAL SINUS SURGERY  July 2012  ? TUBAL LIGATION  1984  ? ?Patient Active Problem List  ? Diagnosis Date Noted  ? Genetic testing 02/03/2022  ? Family history of breast cancer 01/14/2022  ? Family history of lung cancer 01/14/2022  ? Family history of cancer of mouth 01/14/2022  ? Family history of cancer 12/29/2021  ? Right shoulder pain 12/29/2021  ? Low back pain 11/07/2020  ? Dysuria 10/24/2020  ? Pain, dental 09/29/2020  ? Hypercholesterolemia 04/08/2020  ? Left knee pain 04/08/2020  ? COVID-19 virus infection 10/31/2019  ? Stress 09/25/2019  ? Joint stiffness of hand 06/26/2018  ? Plantar fasciitis 06/26/2018  ? Left leg pain 02/21/2018  ? Hyperglycemia  02/21/2018  ? Visual disturbance 02/20/2016  ? Near syncope 02/20/2016  ? Right lower quadrant pain 08/04/2015  ? Abnormal liver function tests 08/04/2015  ? UTI symptoms 03/31/2015  ? Health care maintenance 02/03/2015  ? Cough 12/21/2014  ? History of colonic polyps 05/29/2014  ? Leukopenia 07/07/2013  ? Asthma 10/10/2012  ? Allergic rhinitis 10/10/2012  ? GERD (gastroesophageal reflux disease) 10/10/2012  ? Hypertension 10/10/2012  ? ? ?REFERRING DIAG: M25.511 (ICD-10-CM) - Right shoulder pain, unspecified chronicity ? ?THERAPY DIAG:  ?Acute pain of right shoulder ? ?Radiculopathy, cervical region ? ?PERTINENT HISTORY: R shoulder pain. No known method of injury. Pain at R [facility] join, and posterior medial scapula. Pain began about 3 months ago, gradual onset. Pt also bends over to the side (R lateral trunk side bend) and reaches down with her R arm (R shoulder abduction) to pick up her yorkie from the floor and bring the dog to her bed. Pt wonders if that contributed to her pain. Pain stayed about the same since onset. Likes to work on her garden. ? ?PRECAUTIONS: No known precautions ? ?SUBJECTIVE: R shoulder is is better. No pain currently. Has R AC joint pain when picking up her dog. Always feels better  after PT.  ? ? ?PAIN:  ?Are you having pain? Yes: NPRS scale: 0/10 ?Pain location: R shoulder ?Pain description: soreness ? ? ? ? ? ?Objective  ? ? ? ?   ?Therapeutic exercise ? ?R shoulder ER yellow band 10x3 ? ?Standing scapular retraction green band 10x5 seconds for 2 sets ? ?Doorway pectoralis stretch, R shoulder in about 30 degrees abduction 30 seconds x 3 ? ?R first rib stretch with strap 20 seconds x 5 ? ? ?Seated manually resisted scapular retraction targeting the lower trap  ? R 10x5 seconds for 3 sets ? ? ?Seated chin tucks 10x5 seconds for 3 sets to promote upper thoracic extension  ? ? ?Improved exercise technique, movement at target joints, use of target muscles after mod verbal, visual, tactile cues.   ?  ? Manual therapy ?Seated STM R pectoralis muscle to decrease tension  ? ?Seated STM B cervical paraspinal muscles and R upper trap muscle to decrease tension  ? ? ?Decreased R AC joint pain with  R shoulder flexion AROM afterwards.  ? ? ? ? ?Response to treatment ?Pt tolerated session well without aggravation of symptoms.  ?  ?Clinical impression ?Still demonstrates difficulty with R scapular control and mechanics, needing cues for posterior tipping and scapular depression. Overall improving R shoulder pain level based on subjective reports. Continued working on improving upper thoracic extension and scapular strength as well as decreasing cervical paraspinal, R pectoralis and R upper trap muscle tension, to decrease stress to lower cervical spine and R anterior shoulder. Pt tolerated session well without aggravation of symptoms. Pt will benefit from continued skilled physical therapy services to decrease pain, improve strength, ROM, and function.  ? ? ?PATIENT EDUCATION: ?Education details: there-ex, HEP ?Person educated: Patient ?Education method: Explanation, Demonstration, Tactile cues, Verbal cues, and Handouts ?Education comprehension: verbalized understanding and returned demonstration ? ? ?HOME EXERCISE PROGRAM: ?Access Code: [email or web address] ?URL: [email or web address] ?Date: 02/12/2022 ?Prepared by: Joneen Boers ? ?Exercises ?- Seated Scapular Retraction  - 3 x daily - 7 x weekly - 3 sets - 10 reps - 5 seconds hold ?- Seated Thoracic Lumbar Extension  - 1 x daily - 7 x weekly - 3 sets - 10 reps - 5 seconds hold ?- Seated Cervical Retraction  - 1 x daily - 7 x weekly - 3 sets - 10 reps - 5 seconds hold ?- Standing Shoulder External Rotation with Resistance  - 1 x daily - 7 x weekly - 2 sets - 10 reps ?- First Rib Mobilization with Strap  - 3 x daily - 7 x weekly - 1 sets - 5 reps - 20 seconds hold ? ? ? ? ? ? PT Short Term Goals   ? ?  ? PT SHORT TERM GOAL #1  ? Title Pt will be independent with  her initial HEP to decrease pain, improve strength, function, and ability to reach with less difficulty.   ? Baseline Pt has started her HEP (01/28/2022)   ? Time 3   ? Period Weeks   ? Status New   ? Target Date 02/20/22   ? ?  ?  ? ?  ? ? ? PT Long Term Goals  ? ?  ? PT LONG TERM GOAL #1  ? Title Pt will have a decrease in R shoulder pain to 4/10 or less at worst to promote ability to reach, don and doff clothes, fix her hair as well as pick up her dog with less difficulty.   ?  Baseline 8/10 R shoulder pain at worst for the past 2 months (01/28/2022)   ? Time 8   ? Period Weeks   ? Status New   ? Target Date 03/27/22   ?  ? PT LONG TERM GOAL #2  ? Title Pt will improve her shoulder FOTO score by at least 10 points as a demonstration of improved function.   ? Baseline R shoulder FOTO 53 (01/28/2022)   ? Time 8   ? Period Weeks   ? Status New   ? Target Date 03/27/22   ?  ? PT LONG TERM GOAL #3  ? Title Pt will improve R shoulder flexion AROM to 155 degrees or more to promote ability to reach with less difficulty.   ? Baseline R shoulder flexion AROM 137 degrees with R upper trap pain (01/28/2022)   ? Time 8   ? Period Weeks   ? Status New   ? Target Date 03/27/22   ?  ? PT LONG TERM GOAL #4  ? Title Pt will improve R functional IR AROM to R thumb to T6 spinous process to promote ability to don and doff clothes more comfortably.   ? Baseline R functional IR AROM R thumb to T9 spinous process. (01/28/2022)   ? Time 8   ? Period Weeks   ? Status New   ? Target Date 03/27/22   ? ?  ?  ? ?  ? ? ? Plan   ? ? Clinical Impression Statement Still demonstrates difficulty with R scapular control and mechanics, needing cues for posterior tipping and scapular depression. Overall improving R shoulder pain level based on subjective reports. Continued working on improving upper thoracic extension and scapular strength as well as decreasing cervical paraspinal, R pectoralis and R upper trap muscle tension, to decrease stress to lower  cervical spine and R anterior shoulder. Pt tolerated session well without aggravation of symptoms. Pt will benefit from continued skilled physical therapy services to decrease pain, improve strength, ROM, a

## 2022-02-19 ENCOUNTER — Ambulatory Visit: Payer: PPO

## 2022-02-19 DIAGNOSIS — M25511 Pain in right shoulder: Secondary | ICD-10-CM | POA: Diagnosis not present

## 2022-02-19 DIAGNOSIS — M5412 Radiculopathy, cervical region: Secondary | ICD-10-CM

## 2022-02-19 NOTE — Therapy (Signed)
?OUTPATIENT PHYSICAL THERAPY TREATMENT NOTE ? ? ?Patient Name: Chloe Moyer ?MRN: [id number] ?DOB:March 02, 1951, 71 y.o., female ?Today's Date: 02/19/2022 ? ?PCP: Einar Pheasant, MD ?REFERRING PROVIDER: Einar Pheasant, MD ? ? PT End of Session - 02/19/22 1413   ? ? Visit Number 6   ? Number of Visits 17   ? Date for PT Re-Evaluation 03/27/22   ? Authorization Type 6   ? Authorization Time Period of 10 progress report   ? PT Start Time 1413   ? PT Stop Time 3557   ? PT Time Calculation (min) 44 min   ? Activity Tolerance Patient tolerated treatment well   ? Behavior During Therapy [facility] for tasks assessed/performed   ? ?  ?  ? ?  ? ? ? ? ? ? ?Past Medical History:  ?Diagnosis Date  ? Allergic rhinitis   ? Arthritis   ? Asthma   ? Diverticulosis, sigmoid   ? Family history of breast cancer   ? Family history of cancer of mouth   ? Family history of lung cancer   ? GERD (gastroesophageal reflux disease)   ? History of hiatal hernia   ? Hypertension 06/12/2005  ? Ulcer   ? ?Past Surgical History:  ?Procedure Laterality Date  ? BREAST BIOPSY Right 09/01/2017  ? Affirm Bx of 2 areas- both areas were fibroadenomatoid change   ? BREAST CYST ASPIRATION Left 1990  ? CARDIAC CATHETERIZATION  July 2012  ? CHOLECYSTECTOMY  1991  ? LIPOMA EXCISION  4/99  ? Left flank area  ? NASAL SINUS SURGERY  July 2012  ? TUBAL LIGATION  1984  ? ?Patient Active Problem List  ? Diagnosis Date Noted  ? Genetic testing 02/03/2022  ? Family history of breast cancer 01/14/2022  ? Family history of lung cancer 01/14/2022  ? Family history of cancer of mouth 01/14/2022  ? Family history of cancer 12/29/2021  ? Right shoulder pain 12/29/2021  ? Low back pain 11/07/2020  ? Dysuria 10/24/2020  ? Pain, dental 09/29/2020  ? Hypercholesterolemia 04/08/2020  ? Left knee pain 04/08/2020  ? COVID-19 virus infection 10/31/2019  ? Stress 09/25/2019  ? Joint stiffness of hand 06/26/2018  ? Plantar fasciitis 06/26/2018  ? Left leg pain 02/21/2018  ? Hyperglycemia  02/21/2018  ? Visual disturbance 02/20/2016  ? Near syncope 02/20/2016  ? Right lower quadrant pain 08/04/2015  ? Abnormal liver function tests 08/04/2015  ? UTI symptoms 03/31/2015  ? Health care maintenance 02/03/2015  ? Cough 12/21/2014  ? History of colonic polyps 05/29/2014  ? Leukopenia 07/07/2013  ? Asthma 10/10/2012  ? Allergic rhinitis 10/10/2012  ? GERD (gastroesophageal reflux disease) 10/10/2012  ? Hypertension 10/10/2012  ? ? ?REFERRING DIAG: M25.511 (ICD-10-CM) - Right shoulder pain, unspecified chronicity ? ?THERAPY DIAG:  ?Acute pain of right shoulder ? ?Radiculopathy, cervical region ? ?PERTINENT HISTORY: R shoulder pain. No known method of injury. Pain at R [facility] join, and posterior medial scapula. Pain began about 3 months ago, gradual onset. Pt also bends over to the side (R lateral trunk side bend) and reaches down with her R arm (R shoulder abduction) to pick up her yorkie from the floor and bring the dog to her bed. Pt wonders if that contributed to her pain. Pain stayed about the same since onset. Likes to work on her garden. ? ?PRECAUTIONS: No known precautions ? ?SUBJECTIVE: R shoulder is is better. No pain currently. Still has R AC joint discomfort.  ? ? ? ? ? ?  PAIN:  ?Are you having pain? Yes: NPRS scale: 0/10 ?Pain location: R shoulder ?Pain description: soreness ? ? ? ? ? ?Objective  ? ? ? ?   ?Therapeutic exercise ? ?Standing L shoulder extension with scapular retraction green band 10x2 to counter R rhomboid pull to lower cervical and upper thoracic spine ? ?Standing B scapular retraction green band 10x5 seconds  ? ? ?Doorway pectoralis stretch, R shoulder in about 30 degrees abduction 30 seconds x 3 ? Reviewed and given as part of her HEP. Pt demonstrated and verbalized understanding.  ? ?R first rib stretch with strap 20 seconds x 5 ? ? ?Seated chin tucks 10x5 seconds for 3 sets to promote upper thoracic extension  ? ?Seated manually resisted scapular retraction targeting the lower trap   ? R 10x5 seconds for 3 sets ? ? ? ?Improved exercise technique, movement at target joints, use of target muscles after mod verbal, visual, tactile cues.  ?  ? Manual therapy  ?Seated STM R pectoralis muscle to decrease tension  ? ?Seated STM R anterior shoulder and proximal arm to decrease fascial restrictions ? Feels better per pt.  ? ?Seated STM R upper trap muscle area to decrease fascial restrictions  ? ? ?Decreased R AC joint pain with  R shoulder flexion AROM afterwards.  ? ? ? ? ?Response to treatment ?Pt tolerated session well without aggravation of symptoms.  ?  ?Clinical impression ?Decreased R superior shoulder discomfort around [facility] joint with treatment to improve fascial mobility and decrease upper trap and scalene muscle tension. Pt also demonstrates difficulty with R scapular mechanics but improves with cues and repetition.  Pt tolerated session well without aggravation of symptoms. Pt will benefit from continued skilled physical therapy services to decrease pain, improve strength, ROM, and function.  ? ? ?PATIENT EDUCATION: ?Education details: there-ex, HEP ?Person educated: Patient ?Education method: Explanation, Demonstration, Tactile cues, Verbal cues, and Handouts ?Education comprehension: verbalized understanding and returned demonstration ? ? ?HOME EXERCISE PROGRAM: ?Access Code: [email or web address] ?URL: [email or web address] ?Date: 02/12/2022 ?Prepared by: Joneen Boers ? ?Exercises ?- Seated Scapular Retraction  - 3 x daily - 7 x weekly - 3 sets - 10 reps - 5 seconds hold ?- Seated Thoracic Lumbar Extension  - 1 x daily - 7 x weekly - 3 sets - 10 reps - 5 seconds hold ?- Seated Cervical Retraction  - 1 x daily - 7 x weekly - 3 sets - 10 reps - 5 seconds hold ?- Standing Shoulder External Rotation with Resistance  - 1 x daily - 7 x weekly - 2 sets - 10 reps ?- First Rib Mobilization with Strap  - 3 x daily - 7 x weekly - 1 sets - 5 reps - 20 seconds hold ?- Doorway Pec Stretch at 60 Degrees  Abduction with Arm Straight  - 3 x daily - 7 x weekly - 1 sets - 3 reps - 30 seconds hold ? ? ? ? ? PT Short Term Goals   ? ?  ? PT SHORT TERM GOAL #1  ? Title Pt will be independent with her initial HEP to decrease pain, improve strength, function, and ability to reach with less difficulty.   ? Baseline Pt has started her HEP (01/28/2022)   ? Time 3   ? Period Weeks   ? Status New   ? Target Date 02/20/22   ? ?  ?  ? ?  ? ? ? PT Long Term Goals  ? ?  ? PT  LONG TERM GOAL #1  ? Title Pt will have a decrease in R shoulder pain to 4/10 or less at worst to promote ability to reach, don and doff clothes, fix her hair as well as pick up her dog with less difficulty.   ? Baseline 8/10 R shoulder pain at worst for the past 2 months (01/28/2022)   ? Time 8   ? Period Weeks   ? Status New   ? Target Date 03/27/22   ?  ? PT LONG TERM GOAL #2  ? Title Pt will improve her shoulder FOTO score by at least 10 points as a demonstration of improved function.   ? Baseline R shoulder FOTO 53 (01/28/2022)   ? Time 8   ? Period Weeks   ? Status New   ? Target Date 03/27/22   ?  ? PT LONG TERM GOAL #3  ? Title Pt will improve R shoulder flexion AROM to 155 degrees or more to promote ability to reach with less difficulty.   ? Baseline R shoulder flexion AROM 137 degrees with R upper trap pain (01/28/2022)   ? Time 8   ? Period Weeks   ? Status New   ? Target Date 03/27/22   ?  ? PT LONG TERM GOAL #4  ? Title Pt will improve R functional IR AROM to R thumb to T6 spinous process to promote ability to don and doff clothes more comfortably.   ? Baseline R functional IR AROM R thumb to T9 spinous process. (01/28/2022)   ? Time 8   ? Period Weeks   ? Status New   ? Target Date 03/27/22   ? ?  ?  ? ?  ? ? ? Plan   ? ? Clinical Impression Statement Decreased R superior shoulder discomfort around [facility] joint with treatment to improve fascial mobility and decrease upper trap and scalene muscle tension. Pt also demonstrates difficulty with R scapular  mechanics but improves with cues and repetition.  Pt tolerated session well without aggravation of symptoms. Pt will benefit from continued skilled physical therapy services to decrease pain, improve streng

## 2022-02-24 ENCOUNTER — Ambulatory Visit: Payer: PPO

## 2022-02-24 DIAGNOSIS — M25511 Pain in right shoulder: Secondary | ICD-10-CM | POA: Diagnosis not present

## 2022-02-24 DIAGNOSIS — M5412 Radiculopathy, cervical region: Secondary | ICD-10-CM

## 2022-02-24 NOTE — Therapy (Signed)
?OUTPATIENT PHYSICAL THERAPY TREATMENT NOTE ? ? ?Patient Name: Chloe Moyer ?MRN: [id number] ?DOB:1951/05/26, 71 y.o., female ?Today's Date: 02/24/2022 ? ?PCP: Einar Pheasant, MD ?REFERRING PROVIDER: Einar Pheasant, MD ? ? PT End of Session - 02/24/22 1327   ? ? Visit Number 7   ? Number of Visits 17   ? Date for PT Re-Evaluation 03/27/22   ? Authorization Type 7   ? Authorization Time Period of 10 progress report   ? PT Start Time 1328   ? PT Stop Time 1408   ? PT Time Calculation (min) 40 min   ? Activity Tolerance Patient tolerated treatment well   ? Behavior During Therapy [facility] for tasks assessed/performed   ? ?  ?  ? ?  ? ? ? ? ? ? ? ?Past Medical History:  ?Diagnosis Date  ? Allergic rhinitis   ? Arthritis   ? Asthma   ? Diverticulosis, sigmoid   ? Family history of breast cancer   ? Family history of cancer of mouth   ? Family history of lung cancer   ? GERD (gastroesophageal reflux disease)   ? History of hiatal hernia   ? Hypertension 06/12/2005  ? Ulcer   ? ?Past Surgical History:  ?Procedure Laterality Date  ? BREAST BIOPSY Right 09/01/2017  ? Affirm Bx of 2 areas- both areas were fibroadenomatoid change   ? BREAST CYST ASPIRATION Left 1990  ? CARDIAC CATHETERIZATION  July 2012  ? CHOLECYSTECTOMY  1991  ? LIPOMA EXCISION  4/99  ? Left flank area  ? NASAL SINUS SURGERY  July 2012  ? TUBAL LIGATION  1984  ? ?Patient Active Problem List  ? Diagnosis Date Noted  ? Genetic testing 02/03/2022  ? Family history of breast cancer 01/14/2022  ? Family history of lung cancer 01/14/2022  ? Family history of cancer of mouth 01/14/2022  ? Family history of cancer 12/29/2021  ? Right shoulder pain 12/29/2021  ? Low back pain 11/07/2020  ? Dysuria 10/24/2020  ? Pain, dental 09/29/2020  ? Hypercholesterolemia 04/08/2020  ? Left knee pain 04/08/2020  ? COVID-19 virus infection 10/31/2019  ? Stress 09/25/2019  ? Joint stiffness of hand 06/26/2018  ? Plantar fasciitis 06/26/2018  ? Left leg pain 02/21/2018  ?  Hyperglycemia 02/21/2018  ? Visual disturbance 02/20/2016  ? Near syncope 02/20/2016  ? Right lower quadrant pain 08/04/2015  ? Abnormal liver function tests 08/04/2015  ? UTI symptoms 03/31/2015  ? Health care maintenance 02/03/2015  ? Cough 12/21/2014  ? History of colonic polyps 05/29/2014  ? Leukopenia 07/07/2013  ? Asthma 10/10/2012  ? Allergic rhinitis 10/10/2012  ? GERD (gastroesophageal reflux disease) 10/10/2012  ? Hypertension 10/10/2012  ? ? ?REFERRING DIAG: M25.511 (ICD-10-CM) - Right shoulder pain, unspecified chronicity ? ?THERAPY DIAG:  ?Acute pain of right shoulder ? ?Radiculopathy, cervical region ? ?PERTINENT HISTORY: R shoulder pain. No known method of injury. Pain at R [facility] join, and posterior medial scapula. Pain began about 3 months ago, gradual onset. Pt also bends over to the side (R lateral trunk side bend) and reaches down with her R arm (R shoulder abduction) to pick up her yorkie from the floor and bring the dog to her bed. Pt wonders if that contributed to her pain. Pain stayed about the same since onset. Likes to work on her garden. ? ?PRECAUTIONS: No known precautions ? ?SUBJECTIVE: R shoulder is good. Its improving. The top of the shoulder is not as bad. Hardly notice it. Its  coming along. 2/10 at most for the past 7 days.  ? ? ? ? ? ?PAIN:  ?Are you having pain? Yes: NPRS scale: 1/10 ?Pain location: R shoulder ?Pain description: soreness ? ? ? ? ? ?Objective  ? ? ? Manual therapy  ?Seated STM R pectoralis muscle to decrease tension  ? ?Seated STM R anterior shoulder and proximal arm to decrease fascial restrictions ? Feels better per pt.  ? ?Seated STM R upper trap muscle area to decrease fascial restrictions an muscle tension ? ? ? ? ?   ?Therapeutic exercise ? ?Doorway pectoralis stretch, R shoulder in about 30 degrees abduction 30 seconds x 3 ? Reviewed and given as part of her HEP. Pt demonstrated and verbalized understanding.  ? ?Standing B scapular retraction green band 10x5  seconds for 2 sets ? ?Standing chin tucks against wall 10x5 seconds for 2 sets ? ?Seated R first rib stretch with PT 20 seconds x 5 ? ?Seated manually resisted scapular retraction targeting the lower trap  ? R 10x5 seconds for 3 sets ? ?Seated R scapular depression isometrics R forearm against arm rest ? 10x5 seconds for 3 sets ? ? ? ? ? ? ? ?Improved exercise technique, movement at target joints, use of target muscles after mod verbal, visual, tactile cues.  ?  ? ? ?Response to treatment ?Pt tolerated session well without aggravation of symptoms.  ?  ?Clinical impression ?Decreasing overall R shoulder pain based on subjective reports. Continued working on improving R shoulder fascial mobility, decreasing R upper trap and R scalene muscle tension, improving R lower trap strength, improving upper thoracic extension to decrease stress to lower cervical spine as well as improving R shoulder mechanics.  Pt tolerated session well without aggravation of symptoms. Pt will benefit from continued skilled physical therapy services to decrease pain, improve strength, ROM, and function.  ? ? ?PATIENT EDUCATION: ?Education details: there-ex, HEP ?Person educated: Patient ?Education method: Explanation, Demonstration, Tactile cues, Verbal cues, and Handouts ?Education comprehension: verbalized understanding and returned demonstration ? ? ?HOME EXERCISE PROGRAM: ?Access Code: [email or web address] ?URL: [email or web address] ?Date: 02/12/2022 ?Prepared by: Joneen Boers ? ?Exercises ?- Seated Scapular Retraction  - 3 x daily - 7 x weekly - 3 sets - 10 reps - 5 seconds hold ?- Seated Thoracic Lumbar Extension  - 1 x daily - 7 x weekly - 3 sets - 10 reps - 5 seconds hold ?- Seated Cervical Retraction  - 1 x daily - 7 x weekly - 3 sets - 10 reps - 5 seconds hold ?- Standing Shoulder External Rotation with Resistance  - 1 x daily - 7 x weekly - 2 sets - 10 reps ?- First Rib Mobilization with Strap  - 3 x daily - 7 x weekly - 1 sets -  5 reps - 20 seconds hold ?- Doorway Pec Stretch at 60 Degrees Abduction with Arm Straight  - 3 x daily - 7 x weekly - 1 sets - 3 reps - 30 seconds hold ? ? ? ? ? PT Short Term Goals   ? ?  ? PT SHORT TERM GOAL #1  ? Title Pt will be independent with her initial HEP to decrease pain, improve strength, function, and ability to reach with less difficulty.   ? Baseline Pt has started her HEP (01/28/2022)   ? Time 3   ? Period Weeks   ? Status New   ? Target Date 02/20/22   ? ?  ?  ? ?  ? ? ?  PT Long Term Goals  ? ?  ? PT LONG TERM GOAL #1  ? Title Pt will have a decrease in R shoulder pain to 4/10 or less at worst to promote ability to reach, don and doff clothes, fix her hair as well as pick up her dog with less difficulty.   ? Baseline 8/10 R shoulder pain at worst for the past 2 months (01/28/2022)   ? Time 8   ? Period Weeks   ? Status New   ? Target Date 03/27/22   ?  ? PT LONG TERM GOAL #2  ? Title Pt will improve her shoulder FOTO score by at least 10 points as a demonstration of improved function.   ? Baseline R shoulder FOTO 53 (01/28/2022)   ? Time 8   ? Period Weeks   ? Status New   ? Target Date 03/27/22   ?  ? PT LONG TERM GOAL #3  ? Title Pt will improve R shoulder flexion AROM to 155 degrees or more to promote ability to reach with less difficulty.   ? Baseline R shoulder flexion AROM 137 degrees with R upper trap pain (01/28/2022)   ? Time 8   ? Period Weeks   ? Status New   ? Target Date 03/27/22   ?  ? PT LONG TERM GOAL #4  ? Title Pt will improve R functional IR AROM to R thumb to T6 spinous process to promote ability to don and doff clothes more comfortably.   ? Baseline R functional IR AROM R thumb to T9 spinous process. (01/28/2022)   ? Time 8   ? Period Weeks   ? Status New   ? Target Date 03/27/22   ? ?  ?  ? ?  ? ? ? Plan   ? ? Clinical Impression Statement Decreasing overall R shoulder pain based on subjective reports. Continued working on improving R shoulder fascial mobility, decreasing R upper  trap and R scalene muscle tension, improving R lower trap strength, improving upper thoracic extension to decrease stress to lower cervical spine as well as improving R shoulder mechanics.  Pt tolerated session well w

## 2022-02-26 ENCOUNTER — Ambulatory Visit: Payer: PPO

## 2022-02-26 DIAGNOSIS — M25511 Pain in right shoulder: Secondary | ICD-10-CM

## 2022-02-26 DIAGNOSIS — M5412 Radiculopathy, cervical region: Secondary | ICD-10-CM

## 2022-02-26 NOTE — Therapy (Signed)
?OUTPATIENT PHYSICAL THERAPY TREATMENT NOTE ? ? ?Patient Name: Chloe Moyer ?MRN: [id number] ?DOB:1951-03-25, 71 y.o., female ?Today's Date: 02/26/2022 ? ?PCP: Einar Pheasant, MD ?REFERRING PROVIDER: Einar Pheasant, MD ? ? PT End of Session - 02/26/22 1409   ? ? Visit Number 8   ? Number of Visits 17   ? Date for PT Re-Evaluation 03/27/22   ? Authorization Type 8   ? Authorization Time Period of 10 progress report   ? PT Start Time 1409   ? PT Stop Time 1453   ? PT Time Calculation (min) 44 min   ? Activity Tolerance Patient tolerated treatment well   ? Behavior During Therapy [facility] for tasks assessed/performed   ? ?  ?  ? ?  ? ? ?Past Medical History:  ?Diagnosis Date  ? Allergic rhinitis   ? Arthritis   ? Asthma   ? Diverticulosis, sigmoid   ? Family history of breast cancer   ? Family history of cancer of mouth   ? Family history of lung cancer   ? GERD (gastroesophageal reflux disease)   ? History of hiatal hernia   ? Hypertension 06/12/2005  ? Ulcer   ? ?Past Surgical History:  ?Procedure Laterality Date  ? BREAST BIOPSY Right 09/01/2017  ? Affirm Bx of 2 areas- both areas were fibroadenomatoid change   ? BREAST CYST ASPIRATION Left 1990  ? CARDIAC CATHETERIZATION  July 2012  ? CHOLECYSTECTOMY  1991  ? LIPOMA EXCISION  4/99  ? Left flank area  ? NASAL SINUS SURGERY  July 2012  ? TUBAL LIGATION  1984  ? ?Patient Active Problem List  ? Diagnosis Date Noted  ? Genetic testing 02/03/2022  ? Family history of breast cancer 01/14/2022  ? Family history of lung cancer 01/14/2022  ? Family history of cancer of mouth 01/14/2022  ? Family history of cancer 12/29/2021  ? Right shoulder pain 12/29/2021  ? Low back pain 11/07/2020  ? Dysuria 10/24/2020  ? Pain, dental 09/29/2020  ? Hypercholesterolemia 04/08/2020  ? Left knee pain 04/08/2020  ? COVID-19 virus infection 10/31/2019  ? Stress 09/25/2019  ? Joint stiffness of hand 06/26/2018  ? Plantar fasciitis 06/26/2018  ? Left leg pain 02/21/2018  ? Hyperglycemia  02/21/2018  ? Visual disturbance 02/20/2016  ? Near syncope 02/20/2016  ? Right lower quadrant pain 08/04/2015  ? Abnormal liver function tests 08/04/2015  ? UTI symptoms 03/31/2015  ? Health care maintenance 02/03/2015  ? Cough 12/21/2014  ? History of colonic polyps 05/29/2014  ? Leukopenia 07/07/2013  ? Asthma 10/10/2012  ? Allergic rhinitis 10/10/2012  ? GERD (gastroesophageal reflux disease) 10/10/2012  ? Hypertension 10/10/2012  ? ? ?REFERRING DIAG: M25.511 (ICD-10-CM) - Right shoulder pain, unspecified chronicity ? ?THERAPY DIAG:  ?Acute pain of right shoulder ? ?Radiculopathy, cervical region ? ?PERTINENT HISTORY: R shoulder pain. No known method of injury. Pain at R [facility] join, and posterior medial scapula. Pain began about 3 months ago, gradual onset. Pt also bends over to the side (R lateral trunk side bend) and reaches down with her R arm (R shoulder abduction) to pick up her yorkie from the floor and bring the dog to her bed. Pt wonders if that contributed to her pain. Pain stayed about the same since onset. Likes to work on her garden. ? ?PRECAUTIONS: No known precautions ? ?SUBJECTIVE: R shoulder is better. Its getting there. Got out and pulled weeds this morning. Has difficulty lifting a gallon of milk with her strength.  Wants to continue PT for a few more weeks.  ? ? ? ?PAIN:  ?Are you having pain? Yes: NPRS scale: 0/10 ?Pain location: R shoulder ?Pain description: soreness ? ? ? ? ? ?Objective  ? ? ? Manual therapy ? ? ?Seated STM R posterior and anterior shoulder and proximal arm to decrease fascial restrictions ?  ?   ?Therapeutic exercise ? ?R shoulder AROM  ? Flexion: 142 degrees  ? Functional IR R thumb to T6 spinous process.  ? ?Standing R shoulder IR isometrics, hand on tummy 10x5 seconds for 2 sets ? ?Seated manually resisted R elbow extension isometrics (30 degrees shoulder flexion, 50 degrees elbow flexion), PT manual resistance 10x5 seconds for 3 sets ? Good posterior glide of R humeral  head palpated ? Improved R shoulder flexion AROM improved to 150 degrees  ? ?Seated manually resisted R shoulder extension isometrics in neutral, PT manually resisted 10x2 with 5 second holds  ? ?Seated manually resisted scapular retraction targeting the lower trap  ? R 10x5 seconds for 3 sets ? ? ? ? ? ?Improved exercise technique, movement at target joints, use of target muscles after mod verbal, visual, tactile cues.  ?  ? ? ?Response to treatment ?Pt tolerated session well without aggravation of symptoms.  ?  ?Clinical impression ?Pt demonstrates significant decrease in pain, as well as improved ROM and function since initial evaluation. Still demonstrates difficulty with end range flexion with discomfort. Improved shoulder flexion AROM with treatment to promote posterior glide of humeral head.  Pt tolerated session well without aggravation of symptoms. Pt will benefit from continued skilled physical therapy services to decrease pain, improve strength, ROM, and function.  ? ? ?PATIENT EDUCATION: ?Education details: there-ex, HEP ?Person educated: Patient ?Education method: Explanation, Demonstration, Tactile cues, Verbal cues, and Handouts ?Education comprehension: verbalized understanding and returned demonstration ? ? ?HOME EXERCISE PROGRAM: ?Access Code: [email or web address] ?URL: [email or web address] ?Date: 02/12/2022 ?Prepared by: Joneen Boers ? ?Exercises ?- Seated Scapular Retraction  - 3 x daily - 7 x weekly - 3 sets - 10 reps - 5 seconds hold ?- Seated Thoracic Lumbar Extension  - 1 x daily - 7 x weekly - 3 sets - 10 reps - 5 seconds hold ?- Seated Cervical Retraction  - 1 x daily - 7 x weekly - 3 sets - 10 reps - 5 seconds hold ?- Standing Shoulder External Rotation with Resistance  - 1 x daily - 7 x weekly - 2 sets - 10 reps ?- First Rib Mobilization with Strap  - 3 x daily - 7 x weekly - 1 sets - 5 reps - 20 seconds hold ?- Doorway Pec Stretch at 60 Degrees Abduction with Arm Straight  - 3 x daily  - 7 x weekly - 1 sets - 3 reps - 30 seconds hold ? ? ? ? ? ? PT Short Term Goals - 02/26/22 1411   ? ?  ? PT SHORT TERM GOAL #1  ? Title Pt will be independent with her initial HEP to decrease pain, improve strength, function, and ability to reach with less difficulty.   ? Baseline Pt has started her HEP (01/28/2022). Has been doing her HEP. No questions. (02/26/2022)   ? Time 3   ? Period Weeks   ? Status Achieved   ? Target Date 02/20/22   ? ?  ?  ? ?  ? ? ? PT Long Term Goals - 02/26/22 1412   ? ?  ? PT LONG TERM GOAL #  1  ? Title Pt will have a decrease in R shoulder pain to 4/10 or less at worst to promote ability to reach, don and doff clothes, fix her hair as well as pick up her dog with less difficulty.   ? Baseline 8/10 R shoulder pain at worst for the past 2 months (01/28/2022); 1.5/10 at most for the past 7 days. Its to the point where it does not bother her. Does not stop her from doing anything. (02/26/2022)   ? Time 8   ? Period Weeks   ? Status Achieved   ? Target Date 03/27/22   ?  ? PT LONG TERM GOAL #2  ? Title Pt will improve her shoulder FOTO score by at least 10 points as a demonstration of improved function.   ? Baseline R shoulder FOTO 53 (01/28/2022); 65 (02/26/2022)   ? Time 8   ? Period Weeks   ? Status Achieved   ? Target Date 03/27/22   ?  ? PT LONG TERM GOAL #3  ? Title Pt will improve R shoulder flexion AROM to 155 degrees or more to promote ability to reach with less difficulty.   ? Baseline R shoulder flexion AROM 137 degrees with R upper trap pain (01/28/2022); 142 degrees with R posterior shoulder pull (02/26/2022)   ? Time 8   ? Period Weeks   ? Status Partially Met   ? Target Date 03/27/22   ?  ? PT LONG TERM GOAL #4  ? Title Pt will improve R functional IR AROM to R thumb to T6 spinous process to promote ability to don and doff clothes more comfortably.   ? Baseline R functional IR AROM R thumb to T9 spinous process. (01/28/2022); R thump to T6 spinous process (02/26/2022)   ? Time 8   ?  Period Weeks   ? Status Achieved   ? Target Date 03/27/22   ? ?  ?  ? ?  ? ? ? Plan - 02/26/22 1500   ? ? Clinical Impression Statement Pt demonstrates significant decrease in pain, as well as improved ROM and f

## 2022-03-04 ENCOUNTER — Ambulatory Visit: Payer: PPO

## 2022-03-04 DIAGNOSIS — M25511 Pain in right shoulder: Secondary | ICD-10-CM | POA: Diagnosis not present

## 2022-03-04 DIAGNOSIS — M5412 Radiculopathy, cervical region: Secondary | ICD-10-CM

## 2022-03-04 NOTE — Therapy (Signed)
?OUTPATIENT PHYSICAL THERAPY TREATMENT NOTE ? ? ?Patient Name: Chloe Moyer ?MRN: [id number] ?DOB:10-10-1951, 71 y.o., female ?Today's Date: 03/04/2022 ? ?PCP: Einar Pheasant, MD ?REFERRING PROVIDER: Einar Pheasant, MD ? ? PT End of Session - 03/04/22 1102   ? ? Visit Number 9   ? Number of Visits 17   ? Date for PT Re-Evaluation 03/27/22   ? Authorization Type 9   ? Authorization Time Period of 10 progress report   ? PT Start Time 1103   ? PT Stop Time [id number]   ? PT Time Calculation (min) 40 min   ? Activity Tolerance Patient tolerated treatment well   ? Behavior During Therapy [facility] for tasks assessed/performed   ? ?  ?  ? ?  ? ? ? ?Past Medical History:  ?Diagnosis Date  ? Allergic rhinitis   ? Arthritis   ? Asthma   ? Diverticulosis, sigmoid   ? Family history of breast cancer   ? Family history of cancer of mouth   ? Family history of lung cancer   ? GERD (gastroesophageal reflux disease)   ? History of hiatal hernia   ? Hypertension 06/12/2005  ? Ulcer   ? ?Past Surgical History:  ?Procedure Laterality Date  ? BREAST BIOPSY Right 09/01/2017  ? Affirm Bx of 2 areas- both areas were fibroadenomatoid change   ? BREAST CYST ASPIRATION Left 1990  ? CARDIAC CATHETERIZATION  July 2012  ? CHOLECYSTECTOMY  1991  ? LIPOMA EXCISION  4/99  ? Left flank area  ? NASAL SINUS SURGERY  July 2012  ? TUBAL LIGATION  1984  ? ?Patient Active Problem List  ? Diagnosis Date Noted  ? Genetic testing 02/03/2022  ? Family history of breast cancer 01/14/2022  ? Family history of lung cancer 01/14/2022  ? Family history of cancer of mouth 01/14/2022  ? Family history of cancer 12/29/2021  ? Right shoulder pain 12/29/2021  ? Low back pain 11/07/2020  ? Dysuria 10/24/2020  ? Pain, dental 09/29/2020  ? Hypercholesterolemia 04/08/2020  ? Left knee pain 04/08/2020  ? COVID-19 virus infection 10/31/2019  ? Stress 09/25/2019  ? Joint stiffness of hand 06/26/2018  ? Plantar fasciitis 06/26/2018  ? Left leg pain 02/21/2018  ? Hyperglycemia  02/21/2018  ? Visual disturbance 02/20/2016  ? Near syncope 02/20/2016  ? Right lower quadrant pain 08/04/2015  ? Abnormal liver function tests 08/04/2015  ? UTI symptoms 03/31/2015  ? Health care maintenance 02/03/2015  ? Cough 12/21/2014  ? History of colonic polyps 05/29/2014  ? Leukopenia 07/07/2013  ? Asthma 10/10/2012  ? Allergic rhinitis 10/10/2012  ? GERD (gastroesophageal reflux disease) 10/10/2012  ? Hypertension 10/10/2012  ? ? ?REFERRING DIAG: M25.511 (ICD-10-CM) - Right shoulder pain, unspecified chronicity ? ?THERAPY DIAG:  ?Acute pain of right shoulder ? ?Radiculopathy, cervical region ? ?PERTINENT HISTORY: R shoulder pain. No known method of injury. Pain at R [facility] join, and posterior medial scapula. Pain began about 3 months ago, gradual onset. Pt also bends over to the side (R lateral trunk side bend) and reaches down with her R arm (R shoulder abduction) to pick up her yorkie from the floor and bring the dog to her bed. Pt wonders if that contributed to her pain. Pain stayed about the same since onset. Likes to work on her garden. ? ?PRECAUTIONS: No known precautions ? ?SUBJECTIVE: R shoulder was horrible last Thurs. Friday, Saturday, Saturday, and yesterday was good. Thinks she over used it Thursday: packed a lot of  sausages and used the weed eater that day. After that day it was all better. Did not do her exercises Thursday.  ? ? ? ? ?PAIN:  ?Are you having pain? Yes: NPRS scale: 0/10 ?Pain location: R shoulder ?Pain description: soreness ? ? ? ? ? ?Objective  ? ? ? Manual therapy ? ? ?Seated STM R superior and anterior shoulder and proximal arm to decrease fascial restrictions ? 158 degrees R shoulder flexion AROM afterwards, no pain.  ? ? ? ?   ?Therapeutic exercise ? ?R scapular depression isometrics 10x3 with 5 second holds  ? ? ?Seated R elbow extension isometrics with forearm on table (30 degrees shoulder flexion, 50 degrees elbow flexion), 10x5 seconds for 3 sets to promote posterior glide R  humeral head ?  ?151 degrees R shoulder flexion AROM afterwards. R shoulder symptoms.  ? ? ?Standing R shoulder IR isometrics, hand on tummy 10x5 seconds  ? ?Standing doorway pectoralis stretch, low  ? R 30 seconds x 3 ? ?Functional R shoulder IR ? R thumb to bra strap ? Pt states donning and doffing bra is easier.  ? ? ?Standing R cross arm stretch 30 seconds x 4 ? Functional R shoulder IR ? R thumb to above bra strap ? ? ? ?Improved exercise technique, movement at target joints, use of target muscles after mod verbal, visual, tactile cues.  ?  ? ? ?Response to treatment ?Pt tolerated session well without aggravation of symptoms.  ?  ?Clinical impression ?Pt able to achieve about 158 degrees R shoulder flexion AROM today after treatment to decrease soft tissue restrictions R superior and anterior shoulder. Continued working on scapular strength and pectoralis flexibility and posterior glide of R humeral head to improve mechanics of R shoulder. Pt tolerated session well without aggravation of symptoms. Pt will benefit from continued skilled physical therapy services to decrease pain, improve strength, ROM, and function.  ? ? ?PATIENT EDUCATION: ?Education details: there-ex, HEP ?Person educated: Patient ?Education method: Explanation, Demonstration, Tactile cues, Verbal cues, and Handouts ?Education comprehension: verbalized understanding and returned demonstration ? ? ?HOME EXERCISE PROGRAM: ?Access Code: [email or web address] ?URL: [email or web address] ?Date: 02/12/2022 ?Prepared by: Joneen Boers ? ?Exercises ?- Seated Scapular Retraction  - 3 x daily - 7 x weekly - 3 sets - 10 reps - 5 seconds hold ?- Seated Thoracic Lumbar Extension  - 1 x daily - 7 x weekly - 3 sets - 10 reps - 5 seconds hold ?- Seated Cervical Retraction  - 1 x daily - 7 x weekly - 3 sets - 10 reps - 5 seconds hold ?- Standing Shoulder External Rotation with Resistance  - 1 x daily - 7 x weekly - 2 sets - 10 reps ?- First Rib Mobilization  with Strap  - 3 x daily - 7 x weekly - 1 sets - 5 reps - 20 seconds hold ?- Doorway Pec Stretch at 60 Degrees Abduction with Arm Straight  - 3 x daily - 7 x weekly - 1 sets - 3 reps - 30 seconds hold ?- Isometric Tricep Extension   - 1 x daily - 7 x weekly - 3 sets - 10 reps - 5 seconds hold ? ? ? ? ? ? PT Short Term Goals - 02/26/22 1411   ? ?  ? PT SHORT TERM GOAL #1  ? Title Pt will be independent with her initial HEP to decrease pain, improve strength, function, and ability to reach with less difficulty.   ? Baseline Pt has  started her HEP (01/28/2022). Has been doing her HEP. No questions. (02/26/2022)   ? Time 3   ? Period Weeks   ? Status Achieved   ? Target Date 02/20/22   ? ?  ?  ? ?  ? ? ? PT Long Term Goals - 02/26/22 1412   ? ?  ? PT LONG TERM GOAL #1  ? Title Pt will have a decrease in R shoulder pain to 4/10 or less at worst to promote ability to reach, don and doff clothes, fix her hair as well as pick up her dog with less difficulty.   ? Baseline 8/10 R shoulder pain at worst for the past 2 months (01/28/2022); 1.5/10 at most for the past 7 days. Its to the point where it does not bother her. Does not stop her from doing anything. (02/26/2022)   ? Time 8   ? Period Weeks   ? Status Achieved   ? Target Date 03/27/22   ?  ? PT LONG TERM GOAL #2  ? Title Pt will improve her shoulder FOTO score by at least 10 points as a demonstration of improved function.   ? Baseline R shoulder FOTO 53 (01/28/2022); 65 (02/26/2022)   ? Time 8   ? Period Weeks   ? Status Achieved   ? Target Date 03/27/22   ?  ? PT LONG TERM GOAL #3  ? Title Pt will improve R shoulder flexion AROM to 155 degrees or more to promote ability to reach with less difficulty.   ? Baseline R shoulder flexion AROM 137 degrees with R upper trap pain (01/28/2022); 142 degrees with R posterior shoulder pull (02/26/2022)   ? Time 8   ? Period Weeks   ? Status Partially Met   ? Target Date 03/27/22   ?  ? PT LONG TERM GOAL #4  ? Title Pt will improve R  functional IR AROM to R thumb to T6 spinous process to promote ability to don and doff clothes more comfortably.   ? Baseline R functional IR AROM R thumb to T9 spinous process. (01/28/2022); R thump to T6 spinous

## 2022-03-06 ENCOUNTER — Ambulatory Visit: Payer: PPO

## 2022-03-06 DIAGNOSIS — M25511 Pain in right shoulder: Secondary | ICD-10-CM

## 2022-03-06 DIAGNOSIS — M5412 Radiculopathy, cervical region: Secondary | ICD-10-CM

## 2022-03-06 NOTE — Therapy (Signed)
?OUTPATIENT PHYSICAL THERAPY TREATMENT NOTE ?And  ?Progress Report ?(01/28/2022 - 03/06/2022) ? ? ?Patient Name: Chloe Moyer ?MRN: [id number] ?DOB:02/21/1951, 71 y.o., female ?Today's Date: 03/06/2022 ? ?PCP: Einar Pheasant, MD ?REFERRING PROVIDER: Einar Pheasant, MD ? ? PT End of Session - 03/06/22 1146   ? ? Visit Number 10   ? Number of Visits 17   ? Date for PT Re-Evaluation 03/27/22   ? Authorization Type 10   ? Authorization Time Period of 10 progress report   ? PT Start Time 1147   ? PT Stop Time 1228   ? PT Time Calculation (min) 41 min   ? Activity Tolerance Patient tolerated treatment well   ? Behavior During Therapy [facility] for tasks assessed/performed   ? ?  ?  ? ?  ? ? ? ? ?Past Medical History:  ?Diagnosis Date  ? Allergic rhinitis   ? Arthritis   ? Asthma   ? Diverticulosis, sigmoid   ? Family history of breast cancer   ? Family history of cancer of mouth   ? Family history of lung cancer   ? GERD (gastroesophageal reflux disease)   ? History of hiatal hernia   ? Hypertension 06/12/2005  ? Ulcer   ? ?Past Surgical History:  ?Procedure Laterality Date  ? BREAST BIOPSY Right 09/01/2017  ? Affirm Bx of 2 areas- both areas were fibroadenomatoid change   ? BREAST CYST ASPIRATION Left 1990  ? CARDIAC CATHETERIZATION  July 2012  ? CHOLECYSTECTOMY  1991  ? LIPOMA EXCISION  4/99  ? Left flank area  ? NASAL SINUS SURGERY  July 2012  ? TUBAL LIGATION  1984  ? ?Patient Active Problem List  ? Diagnosis Date Noted  ? Genetic testing 02/03/2022  ? Family history of breast cancer 01/14/2022  ? Family history of lung cancer 01/14/2022  ? Family history of cancer of mouth 01/14/2022  ? Family history of cancer 12/29/2021  ? Right shoulder pain 12/29/2021  ? Low back pain 11/07/2020  ? Dysuria 10/24/2020  ? Pain, dental 09/29/2020  ? Hypercholesterolemia 04/08/2020  ? Left knee pain 04/08/2020  ? COVID-19 virus infection 10/31/2019  ? Stress 09/25/2019  ? Joint stiffness of hand 06/26/2018  ? Plantar fasciitis  06/26/2018  ? Left leg pain 02/21/2018  ? Hyperglycemia 02/21/2018  ? Visual disturbance 02/20/2016  ? Near syncope 02/20/2016  ? Right lower quadrant pain 08/04/2015  ? Abnormal liver function tests 08/04/2015  ? UTI symptoms 03/31/2015  ? Health care maintenance 02/03/2015  ? Cough 12/21/2014  ? History of colonic polyps 05/29/2014  ? Leukopenia 07/07/2013  ? Asthma 10/10/2012  ? Allergic rhinitis 10/10/2012  ? GERD (gastroesophageal reflux disease) 10/10/2012  ? Hypertension 10/10/2012  ? ? ?REFERRING DIAG: M25.511 (ICD-10-CM) - Right shoulder pain, unspecified chronicity ? ?THERAPY DIAG:  ?Acute pain of right shoulder ? ?Radiculopathy, cervical region ? ?PERTINENT HISTORY: R shoulder pain. No known method of injury. Pain at R [facility] join, and posterior medial scapula. Pain began about 3 months ago, gradual onset. Pt also bends over to the side (R lateral trunk side bend) and reaches down with her R arm (R shoulder abduction) to pick up her yorkie from the floor and bring the dog to her bed. Pt wonders if that contributed to her pain. Pain stayed about the same since onset. Likes to work on her garden. ? ?PRECAUTIONS: No known precautions ? ?SUBJECTIVE: R shoulder hurt during the night last night but did unload a heavy bird bath  yesterday. Ok right now.  ? ? ? ?PAIN:  ?Are you having pain? Yes: NPRS scale: 0/10 ?Pain location: R shoulder ?Pain description: soreness ? ? ?Objective  ? ? ? Manual therapy  ? ?Seated STM R superior and anterior shoulder and proximal arm to decrease fascial restrictions ?  ?   ?Therapeutic exercise ? ?Standing R shoulder flexion AROM  ? ? ?R scapular depression isometrics 10x3 with 5 second holds  ? ?Seated manually resisted scapular retraction targeting the lower trap muscles  ? R 10x5 seconds for 3 sets ? ? ?Seated R elbow extension isometrics with forearm on table (30 degrees shoulder flexion, 50 degrees elbow flexion), 10x5 seconds for 3 sets to promote posterior glide R humeral  head ?  ? ?seated R cross arm stretch 30 seconds x 4 ? ? ?Standing R shoulder IR isometrics, hand on tummy 10x5 seconds  ? ?Standing doorway pectoralis stretch, low  ? R 30 seconds x 3 ? ? ?Improved exercise technique, movement at target joints, use of target muscles after mod verbal, visual, tactile cues.  ?  ? ? ?Response to treatment ?Pt tolerated session well without aggravation of symptoms.  ?  ?Clinical impression ?Pt demonstrates significant decrease in pain, as well as improved ROM and function since initial evaluation. Still demonstrates difficulty with end range flexion with discomfort. Improved shoulder flexion AROM with treatment to promote fascial mobility R shoulder.  Pt tolerated session well without aggravation of symptoms. Pt will benefit from continued skilled physical therapy services to decrease pain, improve strength, ROM, and function.  ? ?PATIENT EDUCATION: ?Education details: there-ex, HEP ?Person educated: Patient ?Education method: Explanation, Demonstration, Tactile cues, Verbal cues, and Handouts ?Education comprehension: verbalized understanding and returned demonstration ? ? ?HOME EXERCISE PROGRAM: ?Access Code: [email or web address] ?URL: [email or web address] ?Date: 02/12/2022 ?Prepared by: Joneen Boers ? ?Exercises ?- Seated Scapular Retraction  - 3 x daily - 7 x weekly - 3 sets - 10 reps - 5 seconds hold ?- Seated Thoracic Lumbar Extension  - 1 x daily - 7 x weekly - 3 sets - 10 reps - 5 seconds hold ?- Seated Cervical Retraction  - 1 x daily - 7 x weekly - 3 sets - 10 reps - 5 seconds hold ?- Standing Shoulder External Rotation with Resistance  - 1 x daily - 7 x weekly - 2 sets - 10 reps ?- First Rib Mobilization with Strap  - 3 x daily - 7 x weekly - 1 sets - 5 reps - 20 seconds hold ?- Doorway Pec Stretch at 60 Degrees Abduction with Arm Straight  - 3 x daily - 7 x weekly - 1 sets - 3 reps - 30 seconds hold ?- Isometric Tricep Extension   - 1 x daily - 7 x weekly - 3 sets - 10  reps - 5 seconds hold ? ? ? ? ? ? PT Short Term Goals - 02/26/22 1411   ? ?  ? PT SHORT TERM GOAL #1  ? Title Pt will be independent with her initial HEP to decrease pain, improve strength, function, and ability to reach with less difficulty.   ? Baseline Pt has started her HEP (01/28/2022). Has been doing her HEP. No questions. (02/26/2022)   ? Time 3   ? Period Weeks   ? Status Achieved   ? Target Date 02/20/22   ? ?  ?  ? ?  ? ? ? PT Long Term Goals - 03/06/22 1150   ? ?  ? PT  LONG TERM GOAL #1  ? Title Pt will have a decrease in R shoulder pain to 4/10 or less at worst to promote ability to reach, don and doff clothes, fix her hair as well as pick up her dog with less difficulty.   ? Baseline 8/10 R shoulder pain at worst for the past 2 months (01/28/2022); 1.5/10 at most for the past 7 days. Its to the point where it does not bother her. Does not stop her from doing anything. (02/26/2022); 2/10 at most for the past 7 days (03/06/2022)   ? Time 8   ? Period Weeks   ? Status Achieved   ? Target Date 03/27/22   ?  ? PT LONG TERM GOAL #2  ? Title Pt will improve her shoulder FOTO score by at least 10 points as a demonstration of improved function.   ? Baseline R shoulder FOTO 53 (01/28/2022); 65 (02/26/2022)   ? Time 8   ? Period Weeks   ? Status Achieved   ? Target Date 03/27/22   ?  ? PT LONG TERM GOAL #3  ? Title Pt will improve R shoulder flexion AROM to 155 degrees or more to promote ability to reach with less difficulty.   ? Baseline R shoulder flexion AROM 137 degrees with R upper trap pain (01/28/2022); 142 degrees with R posterior shoulder pull (02/26/2022); 150 degrees flexion AROM with soreness/irritation (03/06/2022)   ? Time 8   ? Period Weeks   ? Status Partially Met   ? Target Date 03/27/22   ?  ? PT LONG TERM GOAL #4  ? Title Pt will improve R functional IR AROM to R thumb to T6 spinous process to promote ability to don and doff clothes more comfortably.   ? Baseline R functional IR AROM R thumb to T9 spinous  process. (01/28/2022); R thump to T6 spinous process (02/26/2022)   ? Time 8   ? Period Weeks   ? Status Achieved   ? Target Date 03/27/22   ? ?  ?  ? ?  ? ? ? Plan - 03/06/22 1238   ? ? Clinical Impression Statement

## 2022-03-10 ENCOUNTER — Ambulatory Visit: Payer: PPO

## 2022-03-10 DIAGNOSIS — M25511 Pain in right shoulder: Secondary | ICD-10-CM

## 2022-03-10 DIAGNOSIS — M5412 Radiculopathy, cervical region: Secondary | ICD-10-CM

## 2022-03-10 NOTE — Therapy (Signed)
?OUTPATIENT PHYSICAL THERAPY TREATMENT NOTE ? ? ?Patient Name: Chloe Moyer ?MRN: [id number] ?DOB:1951-10-30, 71 y.o., female ?Today's Date: 03/10/2022 ? ?PCP: Einar Pheasant, MD ?REFERRING PROVIDER: Einar Pheasant, MD ? ? PT End of Session - 03/10/22 1548   ? ? Visit Number 11   ? Number of Visits 17   ? Date for PT Re-Evaluation 03/27/22   ? Authorization Type 1   ? Authorization Time Period of 10 progress report   ? PT Start Time 6301   ? PT Stop Time 1626   ? PT Time Calculation (min) 38 min   ? Activity Tolerance Patient tolerated treatment well   ? Behavior During Therapy [facility] for tasks assessed/performed   ? ?  ?  ? ?  ? ? ? ? ? ?Past Medical History:  ?Diagnosis Date  ? Allergic rhinitis   ? Arthritis   ? Asthma   ? Diverticulosis, sigmoid   ? Family history of breast cancer   ? Family history of cancer of mouth   ? Family history of lung cancer   ? GERD (gastroesophageal reflux disease)   ? History of hiatal hernia   ? Hypertension 06/12/2005  ? Ulcer   ? ?Past Surgical History:  ?Procedure Laterality Date  ? BREAST BIOPSY Right 09/01/2017  ? Affirm Bx of 2 areas- both areas were fibroadenomatoid change   ? BREAST CYST ASPIRATION Left 1990  ? CARDIAC CATHETERIZATION  July 2012  ? CHOLECYSTECTOMY  1991  ? LIPOMA EXCISION  4/99  ? Left flank area  ? NASAL SINUS SURGERY  July 2012  ? TUBAL LIGATION  1984  ? ?Patient Active Problem List  ? Diagnosis Date Noted  ? Genetic testing 02/03/2022  ? Family history of breast cancer 01/14/2022  ? Family history of lung cancer 01/14/2022  ? Family history of cancer of mouth 01/14/2022  ? Family history of cancer 12/29/2021  ? Right shoulder pain 12/29/2021  ? Low back pain 11/07/2020  ? Dysuria 10/24/2020  ? Pain, dental 09/29/2020  ? Hypercholesterolemia 04/08/2020  ? Left knee pain 04/08/2020  ? COVID-19 virus infection 10/31/2019  ? Stress 09/25/2019  ? Joint stiffness of hand 06/26/2018  ? Plantar fasciitis 06/26/2018  ? Left leg pain 02/21/2018  ? Hyperglycemia  02/21/2018  ? Visual disturbance 02/20/2016  ? Near syncope 02/20/2016  ? Right lower quadrant pain 08/04/2015  ? Abnormal liver function tests 08/04/2015  ? UTI symptoms 03/31/2015  ? Health care maintenance 02/03/2015  ? Cough 12/21/2014  ? History of colonic polyps 05/29/2014  ? Leukopenia 07/07/2013  ? Asthma 10/10/2012  ? Allergic rhinitis 10/10/2012  ? GERD (gastroesophageal reflux disease) 10/10/2012  ? Hypertension 10/10/2012  ? ? ?REFERRING DIAG: M25.511 (ICD-10-CM) - Right shoulder pain, unspecified chronicity ? ?THERAPY DIAG:  ?Acute pain of right shoulder ? ?Radiculopathy, cervical region ? ?PERTINENT HISTORY: R shoulder pain. No known method of injury. Pain at R [facility] join, and posterior medial scapula. Pain began about 3 months ago, gradual onset. Pt also bends over to the side (R lateral trunk side bend) and reaches down with her R arm (R shoulder abduction) to pick up her yorkie from the floor and bring the dog to her bed. Pt wonders if that contributed to her pain. Pain stayed about the same since onset. Likes to work on her garden. ? ?PRECAUTIONS: No known precautions ? ?SUBJECTIVE: R shoulder is pretty good. No pain currently. Was a little sore but ok the next day after last session.  ? ? ? ?  PAIN:  ?Are you having pain? Yes: NPRS scale: 0/10 ?Pain location: R shoulder ?Pain description: soreness ? ? ?Objective  ? ? ? Manual therapy  ?Seated STM R superior and anterior shoulder and proximal arm to decrease fascial restrictions ? ?Seated STM R upper trap muscle to decrease tension  ?  ?Seated STM R proximal pectoralis muscle to decrease tension  ?   ?Therapeutic exercise ? ?Seated R first rib stretch with PT placing manual caudal pressure to R first rib 30 seconds x 3 ? ?R scapular depression isometrics 10x3 with 5 second holds  ? ? ?Seated R elbow extension isometrics with forearm on table (30 degrees shoulder flexion, 50 degrees elbow flexion), 10x5 seconds for 3 sets to promote posterior glide R  humeral head ?  ?Seated manually resisted scapular retraction targeting the lower trap muscles  ? R 10x5 seconds for 3 sets ? ?Seated manually resisted scapular retraction targeting lower trap muscles ? R 10x5 seconds for 3 sets ? ? ? ? ?Improved exercise technique, movement at target joints, use of target muscles after mod verbal, visual, tactile cues.  ?  ? ? ?Response to treatment ?Pt tolerated session well without aggravation of symptoms.  ?  ?Clinical impression ?Continued working on decreasing R lateral neck and upper trap muscle tension, scapular strength and pectoralis flexibility and posterior glide of R humeral head to improve mechanics of R shoulder. Pt tolerated session well without aggravation of symptoms. Pt will benefit from continued skilled physical therapy services to decrease pain, improve strength, ROM, and function.  ? ?PATIENT EDUCATION: ?Education details: there-ex, HEP ?Person educated: Patient ?Education method: Explanation, Demonstration, Tactile cues, Verbal cues, and Handouts ?Education comprehension: verbalized understanding and returned demonstration ? ? ?HOME EXERCISE PROGRAM: ?Access Code: [email or web address] ?URL: [email or web address] ?Date: 02/12/2022 ?Prepared by: Joneen Boers ? ?Exercises ?- Seated Scapular Retraction  - 3 x daily - 7 x weekly - 3 sets - 10 reps - 5 seconds hold ?- Seated Thoracic Lumbar Extension  - 1 x daily - 7 x weekly - 3 sets - 10 reps - 5 seconds hold ?- Seated Cervical Retraction  - 1 x daily - 7 x weekly - 3 sets - 10 reps - 5 seconds hold ?- Standing Shoulder External Rotation with Resistance  - 1 x daily - 7 x weekly - 2 sets - 10 reps ?- First Rib Mobilization with Strap  - 3 x daily - 7 x weekly - 1 sets - 5 reps - 20 seconds hold ?- Doorway Pec Stretch at 60 Degrees Abduction with Arm Straight  - 3 x daily - 7 x weekly - 1 sets - 3 reps - 30 seconds hold ?- Isometric Tricep Extension   - 1 x daily - 7 x weekly - 3 sets - 10 reps - 5 seconds  hold ? ? ? ? ? ? PT Short Term Goals - 02/26/22 1411   ? ?  ? PT SHORT TERM GOAL #1  ? Title Pt will be independent with her initial HEP to decrease pain, improve strength, function, and ability to reach with less difficulty.   ? Baseline Pt has started her HEP (01/28/2022). Has been doing her HEP. No questions. (02/26/2022)   ? Time 3   ? Period Weeks   ? Status Achieved   ? Target Date 02/20/22   ? ?  ?  ? ?  ? ? ? PT Long Term Goals - 03/06/22 1150   ? ?  ? PT LONG TERM  GOAL #1  ? Title Pt will have a decrease in R shoulder pain to 4/10 or less at worst to promote ability to reach, don and doff clothes, fix her hair as well as pick up her dog with less difficulty.   ? Baseline 8/10 R shoulder pain at worst for the past 2 months (01/28/2022); 1.5/10 at most for the past 7 days. Its to the point where it does not bother her. Does not stop her from doing anything. (02/26/2022); 2/10 at most for the past 7 days (03/06/2022)   ? Time 8   ? Period Weeks   ? Status Achieved   ? Target Date 03/27/22   ?  ? PT LONG TERM GOAL #2  ? Title Pt will improve her shoulder FOTO score by at least 10 points as a demonstration of improved function.   ? Baseline R shoulder FOTO 53 (01/28/2022); 65 (02/26/2022)   ? Time 8   ? Period Weeks   ? Status Achieved   ? Target Date 03/27/22   ?  ? PT LONG TERM GOAL #3  ? Title Pt will improve R shoulder flexion AROM to 155 degrees or more to promote ability to reach with less difficulty.   ? Baseline R shoulder flexion AROM 137 degrees with R upper trap pain (01/28/2022); 142 degrees with R posterior shoulder pull (02/26/2022); 150 degrees flexion AROM with soreness/irritation (03/06/2022)   ? Time 8   ? Period Weeks   ? Status Partially Met   ? Target Date 03/27/22   ?  ? PT LONG TERM GOAL #4  ? Title Pt will improve R functional IR AROM to R thumb to T6 spinous process to promote ability to don and doff clothes more comfortably.   ? Baseline R functional IR AROM R thumb to T9 spinous process.  (01/28/2022); R thump to T6 spinous process (02/26/2022)   ? Time 8   ? Period Weeks   ? Status Achieved   ? Target Date 03/27/22   ? ?  ?  ? ?  ? ? ? Plan - 03/10/22 1605   ? ? Clinical Impression Statement Continued

## 2022-03-13 ENCOUNTER — Ambulatory Visit: Payer: PPO

## 2022-03-13 DIAGNOSIS — M5412 Radiculopathy, cervical region: Secondary | ICD-10-CM

## 2022-03-13 DIAGNOSIS — M25511 Pain in right shoulder: Secondary | ICD-10-CM | POA: Diagnosis not present

## 2022-03-13 NOTE — Therapy (Signed)
?OUTPATIENT PHYSICAL THERAPY TREATMENT NOTE ? ? ?Patient Name: Chloe Moyer ?MRN: [id number] ?DOB:02-20-1951, 71 y.o., female ?Today's Date: 03/13/2022 ? ?PCP: Einar Pheasant, MD ?REFERRING PROVIDER: Einar Pheasant, MD ? ? PT End of Session - 03/13/22 1148   ? ? Visit Number 12   ? Number of Visits 17   ? Date for PT Re-Evaluation 03/27/22   ? Authorization Type 2   ? Authorization Time Period of 10 progress report   ? PT Start Time 1149   ? PT Stop Time 1228   ? PT Time Calculation (min) 39 min   ? Activity Tolerance Patient tolerated treatment well   ? Behavior During Therapy [facility] for tasks assessed/performed   ? ?  ?  ? ?  ? ? ? ? ? ? ?Past Medical History:  ?Diagnosis Date  ? Allergic rhinitis   ? Arthritis   ? Asthma   ? Diverticulosis, sigmoid   ? Family history of breast cancer   ? Family history of cancer of mouth   ? Family history of lung cancer   ? GERD (gastroesophageal reflux disease)   ? History of hiatal hernia   ? Hypertension 06/12/2005  ? Ulcer   ? ?Past Surgical History:  ?Procedure Laterality Date  ? BREAST BIOPSY Right 09/01/2017  ? Affirm Bx of 2 areas- both areas were fibroadenomatoid change   ? BREAST CYST ASPIRATION Left 1990  ? CARDIAC CATHETERIZATION  July 2012  ? CHOLECYSTECTOMY  1991  ? LIPOMA EXCISION  4/99  ? Left flank area  ? NASAL SINUS SURGERY  July 2012  ? TUBAL LIGATION  1984  ? ?Patient Active Problem List  ? Diagnosis Date Noted  ? Genetic testing 02/03/2022  ? Family history of breast cancer 01/14/2022  ? Family history of lung cancer 01/14/2022  ? Family history of cancer of mouth 01/14/2022  ? Family history of cancer 12/29/2021  ? Right shoulder pain 12/29/2021  ? Low back pain 11/07/2020  ? Dysuria 10/24/2020  ? Pain, dental 09/29/2020  ? Hypercholesterolemia 04/08/2020  ? Left knee pain 04/08/2020  ? COVID-19 virus infection 10/31/2019  ? Stress 09/25/2019  ? Joint stiffness of hand 06/26/2018  ? Plantar fasciitis 06/26/2018  ? Left leg pain 02/21/2018  ?  Hyperglycemia 02/21/2018  ? Visual disturbance 02/20/2016  ? Near syncope 02/20/2016  ? Right lower quadrant pain 08/04/2015  ? Abnormal liver function tests 08/04/2015  ? UTI symptoms 03/31/2015  ? Health care maintenance 02/03/2015  ? Cough 12/21/2014  ? History of colonic polyps 05/29/2014  ? Leukopenia 07/07/2013  ? Asthma 10/10/2012  ? Allergic rhinitis 10/10/2012  ? GERD (gastroesophageal reflux disease) 10/10/2012  ? Hypertension 10/10/2012  ? ? ?REFERRING DIAG: M25.511 (ICD-10-CM) - Right shoulder pain, unspecified chronicity ? ?THERAPY DIAG:  ?Acute pain of right shoulder ? ?Radiculopathy, cervical region ? ?PERTINENT HISTORY: R shoulder pain. No known method of injury. Pain at R [facility] join, and posterior medial scapula. Pain began about 3 months ago, gradual onset. Pt also bends over to the side (R lateral trunk side bend) and reaches down with her R arm (R shoulder abduction) to pick up her yorkie from the floor and bring the dog to her bed. Pt wonders if that contributed to her pain. Pain stayed about the same since onset. Likes to work on her garden. ? ?PRECAUTIONS: No known precautions ? ?SUBJECTIVE: R shoulder is pretty good. No pain currently, just a little soreness. Does not bother her as much at night  as it used to.  ? ? ? ?PAIN:  ?Are you having pain? Yes: NPRS scale: 0/10 ?Pain location: R shoulder ?Pain description: soreness ? ? ?Objective  ? ? ? Manual therapy  ? ? ?Seated STM B cervical paraspinal and B upper trap muscles to decrease tension  ? ?Seated STM R superior and anterior shoulder and proximal arm to decrease fascial restrictions ?  ?Seated STM R proximal pectoralis muscle to decrease tension  ?   ?Feels better per pt. ? ? ? ?Therapeutic exercise ? ?Seated chin tucks with B scapular retraction 10x5 seconds  ? ?Seated manually resisted shoulder extension with PT 10x R ? ?Standing B shoulder ER red band 10x ? ?Standing R elbow extension isometrics, hand on treadmill bar 10x5 seconds for 2  sets ? ?Seated thoracic extension over chair 10x5 seconds for 2 sets ? ? ? ? ? ? ? ?Improved exercise technique, movement at target joints, use of target muscles after mod verbal, visual, tactile cues.  ?  ? ? ?Response to treatment ?Pt tolerated session well without aggravation of symptoms.  ?  ?Clinical impression ? ?Continued working on decreasing soft tissue restrictions, improving rotator and scapular strength, thoracic extension to promote better cervical and R shoulder mechanics to decrease shoulder pain.  Pt tolerated session well without aggravation of symptoms. Pt will benefit from continued skilled physical therapy services to decrease pain, improve strength, ROM, and function.  ? ? ? ? ? ?PATIENT EDUCATION: ?Education details: there-ex, HEP ?Person educated: Patient ?Education method: Explanation, Demonstration, Tactile cues, Verbal cues, and Handouts ?Education comprehension: verbalized understanding and returned demonstration ? ? ?HOME EXERCISE PROGRAM: ?Access Code: [email or web address] ?URL: [email or web address] ?Date: 02/12/2022 ?Prepared by: Joneen Boers ? ?Exercises ?- Seated Scapular Retraction  - 3 x daily - 7 x weekly - 3 sets - 10 reps - 5 seconds hold ?- Seated Thoracic Lumbar Extension  - 1 x daily - 7 x weekly - 3 sets - 10 reps - 5 seconds hold ?- Seated Cervical Retraction  - 1 x daily - 7 x weekly - 3 sets - 10 reps - 5 seconds hold ?- Standing Shoulder External Rotation with Resistance  - 1 x daily - 7 x weekly - 2 sets - 10 reps ?- First Rib Mobilization with Strap  - 3 x daily - 7 x weekly - 1 sets - 5 reps - 20 seconds hold ?- Doorway Pec Stretch at 60 Degrees Abduction with Arm Straight  - 3 x daily - 7 x weekly - 1 sets - 3 reps - 30 seconds hold ?- Isometric Tricep Extension   - 1 x daily - 7 x weekly - 3 sets - 10 reps - 5 seconds hold ? ? ? ? ? ? PT Short Term Goals - 02/26/22 1411   ? ?  ? PT SHORT TERM GOAL #1  ? Title Pt will be independent with her initial HEP to decrease  pain, improve strength, function, and ability to reach with less difficulty.   ? Baseline Pt has started her HEP (01/28/2022). Has been doing her HEP. No questions. (02/26/2022)   ? Time 3   ? Period Weeks   ? Status Achieved   ? Target Date 02/20/22   ? ?  ?  ? ?  ? ? ? PT Long Term Goals - 03/06/22 1150   ? ?  ? PT LONG TERM GOAL #1  ? Title Pt will have a decrease in R shoulder pain to 4/10 or less  at worst to promote ability to reach, don and doff clothes, fix her hair as well as pick up her dog with less difficulty.   ? Baseline 8/10 R shoulder pain at worst for the past 2 months (01/28/2022); 1.5/10 at most for the past 7 days. Its to the point where it does not bother her. Does not stop her from doing anything. (02/26/2022); 2/10 at most for the past 7 days (03/06/2022)   ? Time 8   ? Period Weeks   ? Status Achieved   ? Target Date 03/27/22   ?  ? PT LONG TERM GOAL #2  ? Title Pt will improve her shoulder FOTO score by at least 10 points as a demonstration of improved function.   ? Baseline R shoulder FOTO 53 (01/28/2022); 65 (02/26/2022)   ? Time 8   ? Period Weeks   ? Status Achieved   ? Target Date 03/27/22   ?  ? PT LONG TERM GOAL #3  ? Title Pt will improve R shoulder flexion AROM to 155 degrees or more to promote ability to reach with less difficulty.   ? Baseline R shoulder flexion AROM 137 degrees with R upper trap pain (01/28/2022); 142 degrees with R posterior shoulder pull (02/26/2022); 150 degrees flexion AROM with soreness/irritation (03/06/2022)   ? Time 8   ? Period Weeks   ? Status Partially Met   ? Target Date 03/27/22   ?  ? PT LONG TERM GOAL #4  ? Title Pt will improve R functional IR AROM to R thumb to T6 spinous process to promote ability to don and doff clothes more comfortably.   ? Baseline R functional IR AROM R thumb to T9 spinous process. (01/28/2022); R thump to T6 spinous process (02/26/2022)   ? Time 8   ? Period Weeks   ? Status Achieved   ? Target Date 03/27/22   ? ?  ?  ? ?  ? ? ? Plan -  03/13/22 1239   ? ? Clinical Impression Statement Continued working on decreasing soft tissue restrictions, improving rotator and scapular strength, thoracic extension to promote better cervical and R shoulder me

## 2022-03-17 ENCOUNTER — Ambulatory Visit: Payer: PPO | Attending: Internal Medicine

## 2022-03-17 DIAGNOSIS — E0500 Thyrotoxicosis with diffuse goiter without thyrotoxic crisis or storm: Secondary | ICD-10-CM | POA: Diagnosis not present

## 2022-03-17 DIAGNOSIS — M5412 Radiculopathy, cervical region: Secondary | ICD-10-CM | POA: Diagnosis not present

## 2022-03-17 DIAGNOSIS — M8580 Other specified disorders of bone density and structure, unspecified site: Secondary | ICD-10-CM | POA: Diagnosis not present

## 2022-03-17 DIAGNOSIS — Z6828 Body mass index (BMI) 28.0-28.9, adult: Secondary | ICD-10-CM | POA: Diagnosis not present

## 2022-03-17 DIAGNOSIS — M25511 Pain in right shoulder: Secondary | ICD-10-CM | POA: Diagnosis not present

## 2022-03-17 DIAGNOSIS — M85852 Other specified disorders of bone density and structure, left thigh: Secondary | ICD-10-CM | POA: Diagnosis not present

## 2022-03-17 NOTE — Therapy (Signed)
?OUTPATIENT PHYSICAL THERAPY TREATMENT NOTE ? ? ?Patient Name: Chloe Moyer ?MRN: [id number] ?DOB:17-Apr-1951, 71 y.o., female ?Today's Date: 03/17/2022 ? ?PCP: Einar Pheasant, MD ?REFERRING PROVIDER: Einar Pheasant, MD ? ? PT End of Session - 03/17/22 1420   ? ? Visit Number 13   ? Number of Visits 17   ? Date for PT Re-Evaluation 03/27/22   ? Authorization Type 3   ? Authorization Time Period of 10 progress report   ? PT Start Time 1420   ? PT Stop Time 1502   ? PT Time Calculation (min) 42 min   ? Activity Tolerance Patient tolerated treatment well   ? Behavior During Therapy [facility] for tasks assessed/performed   ? ?  ?  ? ?  ? ? ? ? ? ? ? ?Past Medical History:  ?Diagnosis Date  ? Allergic rhinitis   ? Arthritis   ? Asthma   ? Diverticulosis, sigmoid   ? Family history of breast cancer   ? Family history of cancer of mouth   ? Family history of lung cancer   ? GERD (gastroesophageal reflux disease)   ? History of hiatal hernia   ? Hypertension 06/12/2005  ? Ulcer   ? ?Past Surgical History:  ?Procedure Laterality Date  ? BREAST BIOPSY Right 09/01/2017  ? Affirm Bx of 2 areas- both areas were fibroadenomatoid change   ? BREAST CYST ASPIRATION Left 1990  ? CARDIAC CATHETERIZATION  July 2012  ? CHOLECYSTECTOMY  1991  ? LIPOMA EXCISION  4/99  ? Left flank area  ? NASAL SINUS SURGERY  July 2012  ? TUBAL LIGATION  1984  ? ?Patient Active Problem List  ? Diagnosis Date Noted  ? Genetic testing 02/03/2022  ? Family history of breast cancer 01/14/2022  ? Family history of lung cancer 01/14/2022  ? Family history of cancer of mouth 01/14/2022  ? Family history of cancer 12/29/2021  ? Right shoulder pain 12/29/2021  ? Low back pain 11/07/2020  ? Dysuria 10/24/2020  ? Pain, dental 09/29/2020  ? Hypercholesterolemia 04/08/2020  ? Left knee pain 04/08/2020  ? COVID-19 virus infection 10/31/2019  ? Stress 09/25/2019  ? Joint stiffness of hand 06/26/2018  ? Plantar fasciitis 06/26/2018  ? Left leg pain 02/21/2018  ?  Hyperglycemia 02/21/2018  ? Visual disturbance 02/20/2016  ? Near syncope 02/20/2016  ? Right lower quadrant pain 08/04/2015  ? Abnormal liver function tests 08/04/2015  ? UTI symptoms 03/31/2015  ? Health care maintenance 02/03/2015  ? Cough 12/21/2014  ? History of colonic polyps 05/29/2014  ? Leukopenia 07/07/2013  ? Asthma 10/10/2012  ? Allergic rhinitis 10/10/2012  ? GERD (gastroesophageal reflux disease) 10/10/2012  ? Hypertension 10/10/2012  ? ? ?REFERRING DIAG: M25.511 (ICD-10-CM) - Right shoulder pain, unspecified chronicity ? ?THERAPY DIAG:  ?Acute pain of right shoulder ? ?Radiculopathy, cervical region ? ?PERTINENT HISTORY: R shoulder pain. No known method of injury. Pain at R [facility] join, and posterior medial scapula. Pain began about 3 months ago, gradual onset. Pt also bends over to the side (R lateral trunk side bend) and reaches down with her R arm (R shoulder abduction) to pick up her yorkie from the floor and bring the dog to her bed. Pt wonders if that contributed to her pain. Pain stayed about the same since onset. Likes to work on her garden. ? ?PRECAUTIONS: No known precautions ? ?SUBJECTIVE: R shoulder is good. No pain currently ? ? ? ?PAIN:  ?Are you having pain? Yes: NPRS scale:  0/10 ?Pain location: R shoulder ?Pain description: soreness ? ? ?Objective  ? ? ? Manual therapy   ? ? ?Seated STM B cervical paraspinal and B upper trap muscles to decrease tension  ? ?Seated STM R superior and anterior shoulder and proximal arm to decrease fascial restrictions ?  ?Seated STM R proximal pectoralis muscle to decrease tension  ?   ?Feels better per pt. ? ? ? ?Therapeutic exercise ? ?Seated chin tucks with B scapular retraction 10x5 seconds for 2 sets ? ?Standing B shoulder ER red band 10x2 ? ?Seated thoracic extension over chair 10x5 seconds for 3 sets ? ?Standing R shoulder extension red band resisted 5x5 seconds for 3 sets ? ?Standing R shoulder adduction red band 8x2 ? ?Seated manually resisted R  elbow extension isometrics 10x5 seconds ? ?R shoulder flexion with proper scapular positioning 10x ? ?R shoulder scaption with proper scapular positioning 10x ? ? ? ? ?Improved exercise technique, movement at target joints, use of target muscles after mod verbal, visual, tactile cues.  ?  ? ? ?Response to treatment ?Pt tolerated session well without aggravation of symptoms.  ?  ?Clinical impression ?Pt continuing to make good progress with decreased R shoulder pain based on subjective reports. Continued working on improving scapular and shoulder strength, thoracic extension to promote better mechanics as her glenohumeral joint.  Pt tolerated session well without aggravation of symptoms. Pt will benefit from continued skilled physical therapy services to decrease pain, improve strength, ROM, and function.  ? ? ? ? ? ?PATIENT EDUCATION: ?Education details: there-ex, HEP ?Person educated: Patient ?Education method: Explanation, Demonstration, Tactile cues, Verbal cues, and Handouts ?Education comprehension: verbalized understanding and returned demonstration ? ? ?HOME EXERCISE PROGRAM: ?Access Code: [email or web address] ?URL: [email or web address] ?Date: 02/12/2022 ?Prepared by: Joneen Boers ? ?Exercises ?- Seated Scapular Retraction  - 3 x daily - 7 x weekly - 3 sets - 10 reps - 5 seconds hold ?- Seated Thoracic Lumbar Extension  - 1 x daily - 7 x weekly - 3 sets - 10 reps - 5 seconds hold ?- Seated Cervical Retraction  - 1 x daily - 7 x weekly - 3 sets - 10 reps - 5 seconds hold ?- Standing Shoulder External Rotation with Resistance  - 1 x daily - 7 x weekly - 2 sets - 10 reps ?- First Rib Mobilization with Strap  - 3 x daily - 7 x weekly - 1 sets - 5 reps - 20 seconds hold ?- Doorway Pec Stretch at 60 Degrees Abduction with Arm Straight  - 3 x daily - 7 x weekly - 1 sets - 3 reps - 30 seconds hold ?- Isometric Tricep Extension   - 1 x daily - 7 x weekly - 3 sets - 10 reps - 5 seconds hold ? ? ? ? ? ? PT Short Term  Goals - 02/26/22 1411   ? ?  ? PT SHORT TERM GOAL #1  ? Title Pt will be independent with her initial HEP to decrease pain, improve strength, function, and ability to reach with less difficulty.   ? Baseline Pt has started her HEP (01/28/2022). Has been doing her HEP. No questions. (02/26/2022)   ? Time 3   ? Period Weeks   ? Status Achieved   ? Target Date 02/20/22   ? ?  ?  ? ?  ? ? ? PT Long Term Goals - 03/06/22 1150   ? ?  ? PT LONG TERM GOAL #1  ? Title  Pt will have a decrease in R shoulder pain to 4/10 or less at worst to promote ability to reach, don and doff clothes, fix her hair as well as pick up her dog with less difficulty.   ? Baseline 8/10 R shoulder pain at worst for the past 2 months (01/28/2022); 1.5/10 at most for the past 7 days. Its to the point where it does not bother her. Does not stop her from doing anything. (02/26/2022); 2/10 at most for the past 7 days (03/06/2022)   ? Time 8   ? Period Weeks   ? Status Achieved   ? Target Date 03/27/22   ?  ? PT LONG TERM GOAL #2  ? Title Pt will improve her shoulder FOTO score by at least 10 points as a demonstration of improved function.   ? Baseline R shoulder FOTO 53 (01/28/2022); 65 (02/26/2022)   ? Time 8   ? Period Weeks   ? Status Achieved   ? Target Date 03/27/22   ?  ? PT LONG TERM GOAL #3  ? Title Pt will improve R shoulder flexion AROM to 155 degrees or more to promote ability to reach with less difficulty.   ? Baseline R shoulder flexion AROM 137 degrees with R upper trap pain (01/28/2022); 142 degrees with R posterior shoulder pull (02/26/2022); 150 degrees flexion AROM with soreness/irritation (03/06/2022)   ? Time 8   ? Period Weeks   ? Status Partially Met   ? Target Date 03/27/22   ?  ? PT LONG TERM GOAL #4  ? Title Pt will improve R functional IR AROM to R thumb to T6 spinous process to promote ability to don and doff clothes more comfortably.   ? Baseline R functional IR AROM R thumb to T9 spinous process. (01/28/2022); R thump to T6 spinous  process (02/26/2022)   ? Time 8   ? Period Weeks   ? Status Achieved   ? Target Date 03/27/22   ? ?  ?  ? ?  ? ? ? Plan - 03/17/22 1420   ? ? Clinical Impression Statement Pt continuing to make good progress with decre

## 2022-03-19 ENCOUNTER — Ambulatory Visit: Payer: PPO

## 2022-03-19 DIAGNOSIS — M5412 Radiculopathy, cervical region: Secondary | ICD-10-CM

## 2022-03-19 DIAGNOSIS — M25511 Pain in right shoulder: Secondary | ICD-10-CM | POA: Diagnosis not present

## 2022-03-19 NOTE — Therapy (Signed)
?OUTPATIENT PHYSICAL THERAPY TREATMENT NOTE ? ? ?Patient Name: Chloe Moyer ?MRN: [id number] ?DOB:07-Mar-1951, 71 y.o., female ?Today's Date: 03/19/2022 ? ?PCP: Einar Pheasant, MD ?REFERRING PROVIDER: Einar Pheasant, MD ? ? PT End of Session - 03/19/22 1551   ? ? Visit Number 14   ? Number of Visits 17   ? Date for PT Re-Evaluation 03/27/22   ? Authorization Type 4   ? Authorization Time Period of 10 progress report   ? PT Start Time 1551   ? PT Stop Time 6568   ? PT Time Calculation (min) 40 min   ? Activity Tolerance Patient tolerated treatment well   ? Behavior During Therapy [facility] for tasks assessed/performed   ? ?  ?  ? ?  ? ? ? ? ? ? ? ? ?Past Medical History:  ?Diagnosis Date  ? Allergic rhinitis   ? Arthritis   ? Asthma   ? Diverticulosis, sigmoid   ? Family history of breast cancer   ? Family history of cancer of mouth   ? Family history of lung cancer   ? GERD (gastroesophageal reflux disease)   ? History of hiatal hernia   ? Hypertension 06/12/2005  ? Ulcer   ? ?Past Surgical History:  ?Procedure Laterality Date  ? BREAST BIOPSY Right 09/01/2017  ? Affirm Bx of 2 areas- both areas were fibroadenomatoid change   ? BREAST CYST ASPIRATION Left 1990  ? CARDIAC CATHETERIZATION  July 2012  ? CHOLECYSTECTOMY  1991  ? LIPOMA EXCISION  4/99  ? Left flank area  ? NASAL SINUS SURGERY  July 2012  ? TUBAL LIGATION  1984  ? ?Patient Active Problem List  ? Diagnosis Date Noted  ? Genetic testing 02/03/2022  ? Family history of breast cancer 01/14/2022  ? Family history of lung cancer 01/14/2022  ? Family history of cancer of mouth 01/14/2022  ? Family history of cancer 12/29/2021  ? Right shoulder pain 12/29/2021  ? Low back pain 11/07/2020  ? Dysuria 10/24/2020  ? Pain, dental 09/29/2020  ? Hypercholesterolemia 04/08/2020  ? Left knee pain 04/08/2020  ? COVID-19 virus infection 10/31/2019  ? Stress 09/25/2019  ? Joint stiffness of hand 06/26/2018  ? Plantar fasciitis 06/26/2018  ? Left leg pain 02/21/2018  ?  Hyperglycemia 02/21/2018  ? Visual disturbance 02/20/2016  ? Near syncope 02/20/2016  ? Right lower quadrant pain 08/04/2015  ? Abnormal liver function tests 08/04/2015  ? UTI symptoms 03/31/2015  ? Health care maintenance 02/03/2015  ? Cough 12/21/2014  ? History of colonic polyps 05/29/2014  ? Leukopenia 07/07/2013  ? Asthma 10/10/2012  ? Allergic rhinitis 10/10/2012  ? GERD (gastroesophageal reflux disease) 10/10/2012  ? Hypertension 10/10/2012  ? ? ?REFERRING DIAG: M25.511 (ICD-10-CM) - Right shoulder pain, unspecified chronicity ? ?THERAPY DIAG:  ?Acute pain of right shoulder ? ?Radiculopathy, cervical region ? ?PERTINENT HISTORY: R shoulder pain. No known method of injury. Pain at R [facility] join, and posterior medial scapula. Pain began about 3 months ago, gradual onset. Pt also bends over to the side (R lateral trunk side bend) and reaches down with her R arm (R shoulder abduction) to pick up her yorkie from the floor and bring the dog to her bed. Pt wonders if that contributed to her pain. Pain stayed about the same since onset. Likes to work on her garden. ? ?PRECAUTIONS: No known precautions ? ?SUBJECTIVE: R shoulder is pretty good. That intense massage helped a lot. No pain since last session. Doing good.  Still has a little catch but no pain.  ? ? ? ?PAIN:  ?Are you having pain? Yes: NPRS scale: 0/10 ?Pain location: R shoulder ?Pain description: soreness ? ? ?Objective  ? ? ? Manual therapy ? ? ?Seated STM B cervical paraspinal and B upper trap muscles to decrease tension  ? ?Seated STM R superior and anterior shoulder and proximal arm to decrease fascial restrictions ?  ?Seated STM R proximal pectoralis muscle to decrease tension  ?   ? ? ? ?Therapeutic exercise ? ?Standing B shoulder ER red band 10x3 ? ?Standing R shoulder extension red band resisted 10x3 ? ?Standing R shoulder adduction red band resisted 10x2 ? ?Standing open books/horizontal B shoulder abduction at corner wall to promote thoracic extension  10x3 ? At about 50 degrees flexion.  ? ?Standing B scapular retraction green band 10x, then 5x ? ? ?R shoulder flexion with proper scapular positioning 10x2 ? ?R shoulder scaption with proper scapular positioning 10x2 ? ? ?Seated manually resisted R elbow extension isometrics 10x5 seconds for 3 sets ? ? ? ? ? ? ?Improved exercise technique, movement at target joints, use of target muscles after mod verbal, visual, tactile cues.  ?  ? ? ?Response to treatment ?Pt tolerated session well without aggravation of symptoms.  ?  ?Clinical impression ?Pt making very good progress with decreased shoulder pain based on subjective reports. Continued working on thoracic extension, scapular and posterior shoulder strength, decreasing pectoralis, upper trap, muscle tension and R shoulder fascial restrictions to promote better mechanics R shoulder girdle. Pt tolerated session well without aggravation of symptoms. Pt will benefit from continued skilled physical therapy services to decrease pain, improve strength, ROM, and function.  ? ? ? ? ? ?PATIENT EDUCATION: ?Education details: there-ex, HEP ?Person educated: Patient ?Education method: Explanation, Demonstration, Tactile cues, Verbal cues, and Handouts ?Education comprehension: verbalized understanding and returned demonstration ? ? ?HOME EXERCISE PROGRAM: ?Access Code: [email or web address] ?URL: [email or web address] ?Date: 02/12/2022 ?Prepared by: Joneen Boers ? ?Exercises ?- Seated Scapular Retraction  - 3 x daily - 7 x weekly - 3 sets - 10 reps - 5 seconds hold ?- Seated Thoracic Lumbar Extension  - 1 x daily - 7 x weekly - 3 sets - 10 reps - 5 seconds hold ?- Seated Cervical Retraction  - 1 x daily - 7 x weekly - 3 sets - 10 reps - 5 seconds hold ?- Standing Shoulder External Rotation with Resistance  - 1 x daily - 7 x weekly - 2 sets - 10 reps ?- First Rib Mobilization with Strap  - 3 x daily - 7 x weekly - 1 sets - 5 reps - 20 seconds hold ?- Doorway Pec Stretch at 60  Degrees Abduction with Arm Straight  - 3 x daily - 7 x weekly - 1 sets - 3 reps - 30 seconds hold ?- Isometric Tricep Extension   - 1 x daily - 7 x weekly - 3 sets - 10 reps - 5 seconds hold ? ? ? ? ? ? PT Short Term Goals - 02/26/22 1411   ? ?  ? PT SHORT TERM GOAL #1  ? Title Pt will be independent with her initial HEP to decrease pain, improve strength, function, and ability to reach with less difficulty.   ? Baseline Pt has started her HEP (01/28/2022). Has been doing her HEP. No questions. (02/26/2022)   ? Time 3   ? Period Weeks   ? Status Achieved   ? Target Date 02/20/22   ? ?  ?  ? ?  ? ? ?  PT Long Term Goals - 03/06/22 1150   ? ?  ? PT LONG TERM GOAL #1  ? Title Pt will have a decrease in R shoulder pain to 4/10 or less at worst to promote ability to reach, don and doff clothes, fix her hair as well as pick up her dog with less difficulty.   ? Baseline 8/10 R shoulder pain at worst for the past 2 months (01/28/2022); 1.5/10 at most for the past 7 days. Its to the point where it does not bother her. Does not stop her from doing anything. (02/26/2022); 2/10 at most for the past 7 days (03/06/2022)   ? Time 8   ? Period Weeks   ? Status Achieved   ? Target Date 03/27/22   ?  ? PT LONG TERM GOAL #2  ? Title Pt will improve her shoulder FOTO score by at least 10 points as a demonstration of improved function.   ? Baseline R shoulder FOTO 53 (01/28/2022); 65 (02/26/2022)   ? Time 8   ? Period Weeks   ? Status Achieved   ? Target Date 03/27/22   ?  ? PT LONG TERM GOAL #3  ? Title Pt will improve R shoulder flexion AROM to 155 degrees or more to promote ability to reach with less difficulty.   ? Baseline R shoulder flexion AROM 137 degrees with R upper trap pain (01/28/2022); 142 degrees with R posterior shoulder pull (02/26/2022); 150 degrees flexion AROM with soreness/irritation (03/06/2022)   ? Time 8   ? Period Weeks   ? Status Partially Met   ? Target Date 03/27/22   ?  ? PT LONG TERM GOAL #4  ? Title Pt will improve R  functional IR AROM to R thumb to T6 spinous process to promote ability to don and doff clothes more comfortably.   ? Baseline R functional IR AROM R thumb to T9 spinous process. (01/28/2022); R thump to T6 spi

## 2022-03-24 ENCOUNTER — Ambulatory Visit: Payer: PPO

## 2022-03-24 DIAGNOSIS — M5412 Radiculopathy, cervical region: Secondary | ICD-10-CM

## 2022-03-24 DIAGNOSIS — M25511 Pain in right shoulder: Secondary | ICD-10-CM | POA: Diagnosis not present

## 2022-03-24 NOTE — Therapy (Signed)
?OUTPATIENT PHYSICAL THERAPY TREATMENT NOTE ? ? ?Patient Name: Chloe Moyer ?MRN: [id number] ?DOB:May 08, 1951, 71 y.o., female ?Today's Date: 03/24/2022 ? ?PCP: Einar Pheasant, MD ?REFERRING PROVIDER: Einar Pheasant, MD ? ? PT End of Session - 03/24/22 1138   ? ? Visit Number 15   ? Number of Visits 17   ? Date for PT Re-Evaluation 03/27/22   ? Authorization Type 5   ? Authorization Time Period of 10 progress report   ? PT Start Time 1139   ? PT Stop Time 1220   ? PT Time Calculation (min) 41 min   ? Activity Tolerance Patient tolerated treatment well   ? Behavior During Therapy [facility] for tasks assessed/performed   ? ?  ?  ? ?  ? ? ? ? ? ? ? ? ? ?Past Medical History:  ?Diagnosis Date  ? Allergic rhinitis   ? Arthritis   ? Asthma   ? Diverticulosis, sigmoid   ? Family history of breast cancer   ? Family history of cancer of mouth   ? Family history of lung cancer   ? GERD (gastroesophageal reflux disease)   ? History of hiatal hernia   ? Hypertension 06/12/2005  ? Ulcer   ? ?Past Surgical History:  ?Procedure Laterality Date  ? BREAST BIOPSY Right 09/01/2017  ? Affirm Bx of 2 areas- both areas were fibroadenomatoid change   ? BREAST CYST ASPIRATION Left 1990  ? CARDIAC CATHETERIZATION  July 2012  ? CHOLECYSTECTOMY  1991  ? LIPOMA EXCISION  4/99  ? Left flank area  ? NASAL SINUS SURGERY  July 2012  ? TUBAL LIGATION  1984  ? ?Patient Active Problem List  ? Diagnosis Date Noted  ? Genetic testing 02/03/2022  ? Family history of breast cancer 01/14/2022  ? Family history of lung cancer 01/14/2022  ? Family history of cancer of mouth 01/14/2022  ? Family history of cancer 12/29/2021  ? Right shoulder pain 12/29/2021  ? Low back pain 11/07/2020  ? Dysuria 10/24/2020  ? Pain, dental 09/29/2020  ? Hypercholesterolemia 04/08/2020  ? Left knee pain 04/08/2020  ? COVID-19 virus infection 10/31/2019  ? Stress 09/25/2019  ? Joint stiffness of hand 06/26/2018  ? Plantar fasciitis 06/26/2018  ? Left leg pain 02/21/2018  ?  Hyperglycemia 02/21/2018  ? Visual disturbance 02/20/2016  ? Near syncope 02/20/2016  ? Right lower quadrant pain 08/04/2015  ? Abnormal liver function tests 08/04/2015  ? UTI symptoms 03/31/2015  ? Health care maintenance 02/03/2015  ? Cough 12/21/2014  ? History of colonic polyps 05/29/2014  ? Leukopenia 07/07/2013  ? Asthma 10/10/2012  ? Allergic rhinitis 10/10/2012  ? GERD (gastroesophageal reflux disease) 10/10/2012  ? Hypertension 10/10/2012  ? ? ?REFERRING DIAG: M25.511 (ICD-10-CM) - Right shoulder pain, unspecified chronicity ? ?THERAPY DIAG:  ?Acute pain of right shoulder ? ?Radiculopathy, cervical region ? ?PERTINENT HISTORY: R shoulder pain. No known method of injury. Pain at R [facility] join, and posterior medial scapula. Pain began about 3 months ago, gradual onset. Pt also bends over to the side (R lateral trunk side bend) and reaches down with her R arm (R shoulder abduction) to pick up her yorkie from the floor and bring the dog to her bed. Pt wonders if that contributed to her pain. Pain stayed about the same since onset. Likes to work on her garden. ? ?PRECAUTIONS: No known precautions ? ?SUBJECTIVE: R shoulder is good.  ? ?PAIN:  ?Are you having pain? Yes: NPRS scale: 0/10 ?Pain  location: R shoulder ?Pain description: soreness ? ? ?Objective  ? ? ? Manual therapy  ? ? ?Seated STM R proximal pectoralis muscle to decrease tension  ? ? ?Seated STM B cervical paraspinal and B upper trap muscles to decrease tension  ? ?Seated STM R superior and anterior shoulder and proximal arm to decrease fascial restrictions ?  ?   ? ? ? ?Therapeutic exercise ? ?Standing open books/horizontal B shoulder abduction at corner wall to promote thoracic extension 10x3 ? At about 50 degrees flexion.  ? ?Standing B shoulder ER red band 10x2, then 6x (difficult towards 3rd set) ? ?Standing R shoulder extension red band resisted 10x3 ? ?Standing R shoulder adduction red band resisted 10x3 ? ?Standing B scapular retraction green band  10x3 ? ? ?R shoulder flexion with proper scapular positioning 10x2 ? ?R shoulder scaption with proper scapular positioning 10x2 ? ? ?Seated manually resisted R elbow extension isometrics 10x5 seconds for 3 sets ? ?Standing R shoulder flexion AROM after session 161 degrees  ? ? ? ? ? ? ? ? ?Improved exercise technique, movement at target joints, use of target muscles after mod verbal, visual, tactile cues.  ?  ? ? ?Response to treatment ?Pt tolerated session well without aggravation of symptoms.  ?  ?Clinical impression ?Improving R shoulder flexion AROM. Pt continues to progress well with decreased R shoulder pain based on subjective reports.  Continued working on thoracic extension, scapular and posterior shoulder strength, decreasing pectoralis, upper trap, muscle tension and R shoulder fascial restrictions to promote better mechanics R shoulder girdle. Pt tolerated session well without aggravation of symptoms. Pt will benefit from continued skilled physical therapy services to decrease pain, improve strength, ROM, and function.  ? ? ? ? ? ?PATIENT EDUCATION: ?Education details: there-ex, HEP ?Person educated: Patient ?Education method: Explanation, Demonstration, Tactile cues, Verbal cues, and Handouts ?Education comprehension: verbalized understanding and returned demonstration ? ? ?HOME EXERCISE PROGRAM: ?Access Code: [email or web address] ?URL: [email or web address] ?Date: 02/12/2022 ?Prepared by: Joneen Boers ? ?Exercises ?- Seated Scapular Retraction  - 3 x daily - 7 x weekly - 3 sets - 10 reps - 5 seconds hold ?- Seated Thoracic Lumbar Extension  - 1 x daily - 7 x weekly - 3 sets - 10 reps - 5 seconds hold ?- Seated Cervical Retraction  - 1 x daily - 7 x weekly - 3 sets - 10 reps - 5 seconds hold ?- Standing Shoulder External Rotation with Resistance  - 1 x daily - 7 x weekly - 2 sets - 10 reps ?- First Rib Mobilization with Strap  - 3 x daily - 7 x weekly - 1 sets - 5 reps - 20 seconds hold ?- Doorway Pec  Stretch at 60 Degrees Abduction with Arm Straight  - 3 x daily - 7 x weekly - 1 sets - 3 reps - 30 seconds hold ?- Isometric Tricep Extension   - 1 x daily - 7 x weekly - 3 sets - 10 reps - 5 seconds hold ? ? ? ? ? ? PT Short Term Goals - 02/26/22 1411   ? ?  ? PT SHORT TERM GOAL #1  ? Title Pt will be independent with her initial HEP to decrease pain, improve strength, function, and ability to reach with less difficulty.   ? Baseline Pt has started her HEP (01/28/2022). Has been doing her HEP. No questions. (02/26/2022)   ? Time 3   ? Period Weeks   ? Status Achieved   ?  Target Date 02/20/22   ? ?  ?  ? ?  ? ? ? PT Long Term Goals - 03/06/22 1150   ? ?  ? PT LONG TERM GOAL #1  ? Title Pt will have a decrease in R shoulder pain to 4/10 or less at worst to promote ability to reach, don and doff clothes, fix her hair as well as pick up her dog with less difficulty.   ? Baseline 8/10 R shoulder pain at worst for the past 2 months (01/28/2022); 1.5/10 at most for the past 7 days. Its to the point where it does not bother her. Does not stop her from doing anything. (02/26/2022); 2/10 at most for the past 7 days (03/06/2022)   ? Time 8   ? Period Weeks   ? Status Achieved   ? Target Date 03/27/22   ?  ? PT LONG TERM GOAL #2  ? Title Pt will improve her shoulder FOTO score by at least 10 points as a demonstration of improved function.   ? Baseline R shoulder FOTO 53 (01/28/2022); 65 (02/26/2022)   ? Time 8   ? Period Weeks   ? Status Achieved   ? Target Date 03/27/22   ?  ? PT LONG TERM GOAL #3  ? Title Pt will improve R shoulder flexion AROM to 155 degrees or more to promote ability to reach with less difficulty.   ? Baseline R shoulder flexion AROM 137 degrees with R upper trap pain (01/28/2022); 142 degrees with R posterior shoulder pull (02/26/2022); 150 degrees flexion AROM with soreness/irritation (03/06/2022)   ? Time 8   ? Period Weeks   ? Status Partially Met   ? Target Date 03/27/22   ?  ? PT LONG TERM GOAL #4  ? Title Pt  will improve R functional IR AROM to R thumb to T6 spinous process to promote ability to don and doff clothes more comfortably.   ? Baseline R functional IR AROM R thumb to T9 spinous process. (01/28/2022);

## 2022-03-26 ENCOUNTER — Ambulatory Visit: Payer: PPO

## 2022-03-26 DIAGNOSIS — M25511 Pain in right shoulder: Secondary | ICD-10-CM | POA: Diagnosis not present

## 2022-03-26 DIAGNOSIS — M5412 Radiculopathy, cervical region: Secondary | ICD-10-CM

## 2022-03-26 NOTE — Therapy (Signed)
?OUTPATIENT PHYSICAL THERAPY TREATMENT NOTE ?And ?Discharge Summary ? ? ?Patient Name: Chloe Moyer ?MRN: [id number] ?DOB:1950-11-28, 71 y.o., female ?Today's Date: 03/26/2022 ? ?PCP: Einar Pheasant, MD ?REFERRING PROVIDER: Einar Pheasant, MD ? ? PT End of Session - 03/26/22 1416   ? ? Visit Number 16   ? Number of Visits 17   ? Date for PT Re-Evaluation 03/27/22   ? Authorization Type 6   ? Authorization Time Period of 10 progress report   ? PT Start Time 1416   ? PT Stop Time 3016   ? PT Time Calculation (min) 30 min   ? Activity Tolerance Patient tolerated treatment well   ? Behavior During Therapy [facility] for tasks assessed/performed   ? ?  ?  ? ?  ? ? ? ? ? ? ? ? ? ? ?Past Medical History:  ?Diagnosis Date  ? Allergic rhinitis   ? Arthritis   ? Asthma   ? Diverticulosis, sigmoid   ? Family history of breast cancer   ? Family history of cancer of mouth   ? Family history of lung cancer   ? GERD (gastroesophageal reflux disease)   ? History of hiatal hernia   ? Hypertension 06/12/2005  ? Ulcer   ? ?Past Surgical History:  ?Procedure Laterality Date  ? BREAST BIOPSY Right 09/01/2017  ? Affirm Bx of 2 areas- both areas were fibroadenomatoid change   ? BREAST CYST ASPIRATION Left 1990  ? CARDIAC CATHETERIZATION  July 2012  ? CHOLECYSTECTOMY  1991  ? LIPOMA EXCISION  4/99  ? Left flank area  ? NASAL SINUS SURGERY  July 2012  ? TUBAL LIGATION  1984  ? ?Patient Active Problem List  ? Diagnosis Date Noted  ? Genetic testing 02/03/2022  ? Family history of breast cancer 01/14/2022  ? Family history of lung cancer 01/14/2022  ? Family history of cancer of mouth 01/14/2022  ? Family history of cancer 12/29/2021  ? Right shoulder pain 12/29/2021  ? Low back pain 11/07/2020  ? Dysuria 10/24/2020  ? Pain, dental 09/29/2020  ? Hypercholesterolemia 04/08/2020  ? Left knee pain 04/08/2020  ? COVID-19 virus infection 10/31/2019  ? Stress 09/25/2019  ? Joint stiffness of hand 06/26/2018  ? Plantar fasciitis 06/26/2018  ? Left leg  pain 02/21/2018  ? Hyperglycemia 02/21/2018  ? Visual disturbance 02/20/2016  ? Near syncope 02/20/2016  ? Right lower quadrant pain 08/04/2015  ? Abnormal liver function tests 08/04/2015  ? UTI symptoms 03/31/2015  ? Health care maintenance 02/03/2015  ? Cough 12/21/2014  ? History of colonic polyps 05/29/2014  ? Leukopenia 07/07/2013  ? Asthma 10/10/2012  ? Allergic rhinitis 10/10/2012  ? GERD (gastroesophageal reflux disease) 10/10/2012  ? Hypertension 10/10/2012  ? ? ?REFERRING DIAG: M25.511 (ICD-10-CM) - Right shoulder pain, unspecified chronicity ? ?THERAPY DIAG:  ?Acute pain of right shoulder ? ?Radiculopathy, cervical region ? ?PERTINENT HISTORY: R shoulder pain. No known method of injury. Pain at R [facility] join, and posterior medial scapula. Pain began about 3 months ago, gradual onset. Pt also bends over to the side (R lateral trunk side bend) and reaches down with her R arm (R shoulder abduction) to pick up her yorkie from the floor and bring the dog to her bed. Pt wonders if that contributed to her pain. Pain stayed about the same since onset. Likes to work on her garden. ? ?PRECAUTIONS: No known precautions ? ?SUBJECTIVE: R shoulder is pretty good. Ready to graduate PT. No problems currently fastening  her bra.  ? ?PAIN:  ?Are you having pain? Yes: NPRS scale: 0/10 ?Pain location: R shoulder ?Pain description: soreness ? ? ?Objective  ? ? ? Manual therapy 23 ? ? ?Seated STM R proximal pectoralis muscle to decrease tension  ? ? ?Seated STM B cervical paraspinal and B upper trap muscles to decrease tension  ? ?Seated STM R superior, and anterior, and posterior shoulder and proximal arm to decrease fascial restrictions ?  ?   ? ? ? ?Therapeutic exercise ? ?Standing R shoulder flexion AROM 161 degrees at start of session.  ? ? ? ? ? ? ? ? ? ? ? ? ?Improved exercise technique, movement at target joints, use of target muscles after mod verbal, visual, tactile cues.  ?  ? ? ?Response to treatment ?Pt tolerated  session well without aggravation of symptoms.  ?  ?Clinical impression ?Pt demonstrates significant decrease in R shoulder pain, improved R shoulder AROM and function since initial evaluation. Pt has achieved all goals and demonstrates independence and consistency with her HEP. Skilled physical therapy services discharged with pt continuing progress with her exercises at home.  ? ? ? ? ? ? ? ? ?PATIENT EDUCATION: ?Education details: there-ex, HEP ?Person educated: Patient ?Education method: Explanation, Demonstration, Tactile cues, Verbal cues, and Handouts ?Education comprehension: verbalized understanding and returned demonstration ? ? ?HOME EXERCISE PROGRAM: ?Access Code: [email or web address] ?URL: [email or web address] ?Date: 02/12/2022 ?Prepared by: Joneen Boers ? ?Exercises ?- Seated Scapular Retraction  - 3 x daily - 7 x weekly - 3 sets - 10 reps - 5 seconds hold ?- Seated Thoracic Lumbar Extension  - 1 x daily - 7 x weekly - 3 sets - 10 reps - 5 seconds hold ?- Seated Cervical Retraction  - 1 x daily - 7 x weekly - 3 sets - 10 reps - 5 seconds hold ?- Standing Shoulder External Rotation with Resistance  - 1 x daily - 7 x weekly - 2 sets - 10 reps ?- First Rib Mobilization with Strap  - 3 x daily - 7 x weekly - 1 sets - 5 reps - 20 seconds hold ?- Doorway Pec Stretch at 60 Degrees Abduction with Arm Straight  - 3 x daily - 7 x weekly - 1 sets - 3 reps - 30 seconds hold ?- Isometric Tricep Extension   - 1 x daily - 7 x weekly - 3 sets - 10 reps - 5 seconds hold ? ? ? ? ? ? PT Short Term Goals - 02/26/22 1411   ? ?  ? PT SHORT TERM GOAL #1  ? Title Pt will be independent with her initial HEP to decrease pain, improve strength, function, and ability to reach with less difficulty.   ? Baseline Pt has started her HEP (01/28/2022). Has been doing her HEP. No questions. (02/26/2022)   ? Time 3   ? Period Weeks   ? Status Achieved   ? Target Date 02/20/22   ? ?  ?  ? ?  ? ? ? PT Long Term Goals - 03/26/22 1418   ? ?   ? PT LONG TERM GOAL #1  ? Title Pt will have a decrease in R shoulder pain to 4/10 or less at worst to promote ability to reach, don and doff clothes, fix her hair as well as pick up her dog with less difficulty.   ? Baseline 8/10 R shoulder pain at worst for the past 2 months (01/28/2022); 1.5/10 at most for the past 7  days. Its to the point where it does not bother her. Does not stop her from doing anything. (02/26/2022); 2/10 at most for the past 7 days (03/06/2022); 0.5/10 at most for the past 7 days (03/26/2022)   ? Time 8   ? Period Weeks   ? Status Achieved   ? Target Date 03/27/22   ?  ? PT LONG TERM GOAL #2  ? Title Pt will improve her shoulder FOTO score by at least 10 points as a demonstration of improved function.   ? Baseline R shoulder FOTO 53 (01/28/2022); 65 (02/26/2022); 72 (03/26/2022)   ? Time 8   ? Period Weeks   ? Status Achieved   ? Target Date 03/27/22   ?  ? PT LONG TERM GOAL #3  ? Title Pt will improve R shoulder flexion AROM to 155 degrees or more to promote ability to reach with less difficulty.   ? Baseline R shoulder flexion AROM 137 degrees with R upper trap pain (01/28/2022); 142 degrees with R posterior shoulder pull (02/26/2022); 150 degrees flexion AROM with soreness/irritation (03/06/2022); 161 degrees flexion AROM (03/26/2022)   ? Time 8   ? Period Weeks   ? Status Achieved   ? Target Date 03/27/22   ?  ? PT LONG TERM GOAL #4  ? Title Pt will improve R functional IR AROM to R thumb to T6 spinous process to promote ability to don and doff clothes more comfortably.   ? Baseline R functional IR AROM R thumb to T9 spinous process. (01/28/2022); R thump to T6 spinous process (02/26/2022)   ? Time 8   ? Period Weeks   ? Status Achieved   ? Target Date 03/27/22   ? ?  ?  ? ?  ? ? ? Plan - 03/26/22 1416   ? ? Clinical Impression Statement Pt demonstrates significant decrease in R shoulder pain, improved R shoulder AROM and function since initial evaluation. Pt has achieved all goals and demonstrates  independence and consistency with her HEP. Skilled physical therapy services discharged with pt continuing progress with her exercises at home.   ? Personal Factors and Comorbidities --   ? Comorbidities

## 2022-03-27 DIAGNOSIS — K5730 Diverticulosis of large intestine without perforation or abscess without bleeding: Secondary | ICD-10-CM | POA: Diagnosis not present

## 2022-03-27 DIAGNOSIS — Z86010 Personal history of colonic polyps: Secondary | ICD-10-CM | POA: Diagnosis not present

## 2022-03-27 DIAGNOSIS — D122 Benign neoplasm of ascending colon: Secondary | ICD-10-CM | POA: Diagnosis not present

## 2022-04-02 ENCOUNTER — Telehealth: Payer: Self-pay | Admitting: Internal Medicine

## 2022-04-02 DIAGNOSIS — R739 Hyperglycemia, unspecified: Secondary | ICD-10-CM

## 2022-04-02 DIAGNOSIS — E7800 Pure hypercholesterolemia, unspecified: Secondary | ICD-10-CM

## 2022-04-02 DIAGNOSIS — I10 Essential (primary) hypertension: Secondary | ICD-10-CM

## 2022-04-02 DIAGNOSIS — R7989 Other specified abnormal findings of blood chemistry: Secondary | ICD-10-CM

## 2022-04-02 NOTE — Telephone Encounter (Signed)
Orders placed for labs

## 2022-04-02 NOTE — Telephone Encounter (Signed)
Patient has a lab appt 5/26, there are no orders in. ?

## 2022-04-02 NOTE — Addendum Note (Signed)
Addended by: Alisa Graff on: 04/02/2022 10:48 PM ? ? Modules accepted: Orders ? ?

## 2022-04-10 DIAGNOSIS — Z86010 Personal history of colonic polyps: Secondary | ICD-10-CM | POA: Diagnosis not present

## 2022-04-10 DIAGNOSIS — K7581 Nonalcoholic steatohepatitis (NASH): Secondary | ICD-10-CM | POA: Diagnosis not present

## 2022-04-10 DIAGNOSIS — K219 Gastro-esophageal reflux disease without esophagitis: Secondary | ICD-10-CM | POA: Diagnosis not present

## 2022-04-11 ENCOUNTER — Other Ambulatory Visit (INDEPENDENT_AMBULATORY_CARE_PROVIDER_SITE_OTHER): Payer: PPO

## 2022-04-11 DIAGNOSIS — R739 Hyperglycemia, unspecified: Secondary | ICD-10-CM

## 2022-04-11 DIAGNOSIS — E7800 Pure hypercholesterolemia, unspecified: Secondary | ICD-10-CM | POA: Diagnosis not present

## 2022-04-11 DIAGNOSIS — I10 Essential (primary) hypertension: Secondary | ICD-10-CM

## 2022-04-11 LAB — CBC WITH DIFFERENTIAL/PLATELET
Basophils Absolute: 0 10*3/uL (ref 0.0–0.1)
Basophils Relative: 1.2 % (ref 0.0–3.0)
Eosinophils Absolute: 0.3 10*3/uL (ref 0.0–0.7)
Eosinophils Relative: 8.6 % — ABNORMAL HIGH (ref 0.0–5.0)
HCT: 37 % (ref 36.0–46.0)
Hemoglobin: 12.1 g/dL (ref 12.0–15.0)
Lymphocytes Relative: 40.1 % (ref 12.0–46.0)
Lymphs Abs: 1.3 10*3/uL (ref 0.7–4.0)
MCHC: 32.8 g/dL (ref 30.0–36.0)
MCV: 88 fl (ref 78.0–100.0)
Monocytes Absolute: 0.3 10*3/uL (ref 0.1–1.0)
Monocytes Relative: 10.1 % (ref 3.0–12.0)
Neutro Abs: 1.3 10*3/uL — ABNORMAL LOW (ref 1.4–7.7)
Neutrophils Relative %: 40 % — ABNORMAL LOW (ref 43.0–77.0)
Platelets: 159 10*3/uL (ref 150.0–400.0)
RBC: 4.2 Mil/uL (ref 3.87–5.11)
RDW: 14.2 % (ref 11.5–15.5)
WBC: 3.3 10*3/uL — ABNORMAL LOW (ref 4.0–10.5)

## 2022-04-11 LAB — HEMOGLOBIN A1C: Hgb A1c MFr Bld: 5.7 % (ref 4.6–6.5)

## 2022-04-11 LAB — BASIC METABOLIC PANEL
BUN: 12 mg/dL (ref 6–23)
CO2: 26 mEq/L (ref 19–32)
Calcium: 10 mg/dL (ref 8.4–10.5)
Chloride: 106 mEq/L (ref 96–112)
Creatinine, Ser: 0.73 mg/dL (ref 0.40–1.20)
GFR: 82.12 mL/min (ref >60.00)
Glucose, Bld: 96 mg/dL (ref 70–99)
Potassium: 4.1 mEq/L (ref 3.5–5.1)
Sodium: 139 mEq/L (ref 135–145)

## 2022-04-11 LAB — LIPID PANEL
Cholesterol: 149 mg/dL (ref 0–200)
HDL: 51.3 mg/dL (ref >39.00)
LDL Cholesterol: 84 mg/dL (ref 0–99)
NonHDL: 97.69
Total CHOL/HDL Ratio: 3
Triglycerides: 67 mg/dL (ref 0.0–149.0)
VLDL: 13.4 mg/dL (ref 0.0–40.0)

## 2022-04-11 LAB — HEPATIC FUNCTION PANEL
ALT: 18 U/L (ref 0–35)
AST: 24 U/L (ref 0–37)
Albumin: 4.1 g/dL (ref 3.5–5.2)
Alkaline Phosphatase: 54 U/L (ref 39–117)
Bilirubin, Direct: 0.1 mg/dL (ref 0.0–0.3)
Total Bilirubin: 0.6 mg/dL (ref 0.2–1.2)
Total Protein: 7.3 g/dL (ref 6.0–8.3)

## 2022-04-15 ENCOUNTER — Encounter: Payer: Self-pay | Admitting: Internal Medicine

## 2022-04-15 ENCOUNTER — Ambulatory Visit (INDEPENDENT_AMBULATORY_CARE_PROVIDER_SITE_OTHER): Payer: PPO | Admitting: Internal Medicine

## 2022-04-15 DIAGNOSIS — D72819 Decreased white blood cell count, unspecified: Secondary | ICD-10-CM

## 2022-04-15 DIAGNOSIS — K219 Gastro-esophageal reflux disease without esophagitis: Secondary | ICD-10-CM

## 2022-04-15 DIAGNOSIS — M25511 Pain in right shoulder: Secondary | ICD-10-CM | POA: Diagnosis not present

## 2022-04-15 DIAGNOSIS — E7800 Pure hypercholesterolemia, unspecified: Secondary | ICD-10-CM

## 2022-04-15 DIAGNOSIS — Z809 Family history of malignant neoplasm, unspecified: Secondary | ICD-10-CM | POA: Diagnosis not present

## 2022-04-15 DIAGNOSIS — I10 Essential (primary) hypertension: Secondary | ICD-10-CM

## 2022-04-15 DIAGNOSIS — R739 Hyperglycemia, unspecified: Secondary | ICD-10-CM | POA: Diagnosis not present

## 2022-04-15 DIAGNOSIS — R7989 Other specified abnormal findings of blood chemistry: Secondary | ICD-10-CM

## 2022-04-15 DIAGNOSIS — Z86010 Personal history of colonic polyps: Secondary | ICD-10-CM

## 2022-04-15 DIAGNOSIS — F439 Reaction to severe stress, unspecified: Secondary | ICD-10-CM

## 2022-04-15 DIAGNOSIS — J4520 Mild intermittent asthma, uncomplicated: Secondary | ICD-10-CM | POA: Diagnosis not present

## 2022-04-15 NOTE — Progress Notes (Signed)
Patient ID: Chloe Moyer, female   DOB: 06/15/1951, 71 y.o.   MRN: [id number]   Subjective:    Patient ID: Chloe Moyer, female    DOB: 1951-10-02, 71 y.o.   MRN: [id number]   Patient here for a scheduled follow up.  Marland Kitchen   HPI Here to follow up regarding her blood pressure, asthma and thyroid issues.  She reports she is doing relatively well.  Stays active.  No chest pain or sob with increased activity or exertion.  No increased cough or congestion.  [name] 04/10/22.  No abdominal pain.  Bowels moving.  Has been to rehab for her shoulder.  Completed 03/26/22.  Doing exercise at home.  Saw endocrinology 03/17/22 - f/u hyperthyroidism - normal TFTs not needing methimazole.  Bone density - stable - osteopenia.  Is s/p genetic testing - negative.     Past Medical History:  Diagnosis Date   Allergic rhinitis    Arthritis    Asthma    Diverticulosis, sigmoid    Family history of breast cancer    Family history of cancer of mouth    Family history of lung cancer    GERD (gastroesophageal reflux disease)    History of hiatal hernia    Hypertension 06/12/2005   Ulcer    Past Surgical History:  Procedure Laterality Date   BREAST BIOPSY Right 09/01/2017   Affirm Bx of 2 areas- both areas were fibroadenomatoid change    BREAST CYST ASPIRATION Left 1990   CARDIAC CATHETERIZATION  July 2012   CHOLECYSTECTOMY  1991   LIPOMA EXCISION  4/99   Left flank area   NASAL SINUS SURGERY  July 2012   TUBAL LIGATION  1984   Family History  Problem Relation Age of Onset   Hypertension Mother    Cancer Mother        oral dx 45   Emphysema Father        smoked   Asthma Father    Lung cancer Father        smoked; dx 79s   Hypertension Father    Cancer - Cervical Paternal Aunt    Cancer Paternal Aunt        stomach v. ovarian   Cancer Paternal Uncle        unk, possibly lung   Breast cancer Maternal Grandmother 60   Stroke Maternal Grandfather    Heart disease Paternal Grandfather         myocardial infarction   Cancer Cousin        unk types   Social History   Socioeconomic History   Marital status: Married    Spouse name: Not on file   Number of children: 2   Years of education: Not on file   Highest education level: Not on file  Occupational History   Occupation: Works at [employer]  Tobacco Use   Smoking status: Never   Smokeless tobacco: Never  Substance and Sexual Activity   Alcohol use: Yes    Alcohol/week: 0.0 standard drinks    Comment: occ wine with dinner   Drug use: No   Sexual activity: Yes  Other Topics Concern   Not on file  Social History Narrative   Not on file   Social Determinants of Health   Financial Resource Strain: Not on file  Food Insecurity: Not on file  Transportation Needs: Not on file  Physical Activity: Not on file  Stress: Not on file  Social Connections: Not  on file     Review of Systems  Constitutional:  Negative for appetite change and unexpected weight change.  HENT:  Negative for congestion and sinus pressure.   Respiratory:  Negative for cough, chest tightness and shortness of breath.   Cardiovascular:  Negative for chest pain, palpitations and leg swelling.  Gastrointestinal:  Negative for abdominal pain, diarrhea, nausea and vomiting.  Genitourinary:  Negative for difficulty urinating and dysuria.  Musculoskeletal:  Negative for joint swelling and myalgias.  Skin:  Negative for color change and rash.  Neurological:  Negative for dizziness, light-headedness and headaches.  Psychiatric/Behavioral:  Negative for agitation and dysphoric mood.       Objective:     BP 128/84   Pulse 97   Temp 98.2 F (36.8 C) (Temporal)   Resp 16   Ht '5\' 6"'  (1.676 m)   Wt 167 lb 12.8 oz (76.1 kg)   LMP 11/03/1996   SpO2 97%   BMI 27.08 kg/m  Wt Readings from Last 3 Encounters:  04/15/22 167 lb 12.8 oz (76.1 kg)  12/16/21 167 lb 12.8 oz (76.1 kg)  08/26/21 166 lb 6.4 oz (75.5 kg)    Physical Exam Vitals  reviewed.  Constitutional:      General: She is not in acute distress.    Appearance: Normal appearance.  HENT:     Head: Normocephalic and atraumatic.     Right Ear: External ear normal.     Left Ear: External ear normal.  Eyes:     General: No scleral icterus.       Right eye: No discharge.        Left eye: No discharge.     Conjunctiva/sclera: Conjunctivae normal.  Neck:     Thyroid: No thyromegaly.  Cardiovascular:     Rate and Rhythm: Normal rate and regular rhythm.  Pulmonary:     Effort: No respiratory distress.     Breath sounds: Normal breath sounds. No wheezing.  Abdominal:     General: Bowel sounds are normal.     Palpations: Abdomen is soft.     Tenderness: There is no abdominal tenderness.  Musculoskeletal:        General: No swelling or tenderness.     Cervical back: Neck supple. No tenderness.  Lymphadenopathy:     Cervical: No cervical adenopathy.  Skin:    Findings: No erythema or rash.  Neurological:     Mental Status: She is alert.  Psychiatric:        Mood and Affect: Mood normal.        Behavior: Behavior normal.     Outpatient Encounter Medications as of 04/15/2022  Medication Sig   albuterol (PROVENTIL HFA;VENTOLIN HFA) 108 (90 BASE) MCG/ACT inhaler Inhale 2 puffs using inhaler every 4-6 hours as needed for SOB/wheeze.   budesonide (PULMICORT) 0.5 MG/2ML nebulizer solution Take 0.5 mg by nebulization 2 (two) times daily.   budesonide-formoterol (SYMBICORT) 160-4.5 MCG/ACT inhaler Inhale 2 puffs into the lungs daily. as directed.   EPINEPHrine (EPIPEN 2-PAK) 0.3 mg/0.3 mL IJ SOAJ injection Inject 0.3 mLs (0.3 mg total) into the muscle once.   hydrocortisone (ANUSOL-HC) 25 MG suppository Place 1 suppository (25 mg total) rectally 2 (two) times daily as needed.   Hypertonic Nasal Wash (SINUS RINSE NA) As directed three times daily   ipratropium-albuterol (DUONEB) 0.5-2.5 (3) MG/3ML SOLN    losartan (COZAAR) 100 MG tablet TAKE 1 TABLET BY MOUTH ONCE  DAILY   omalizumab (XOLAIR) 150 MG injection 150  mg every 30 (thirty) days.    omeprazole (PRILOSEC) 10 MG capsule Take 1 capsule (10 mg total) by mouth daily.   rosuvastatin (CRESTOR) 5 MG tablet TAKE 1 TABLET BY MOUTH ON MONDAY AND THURSDAY   [DISCONTINUED] doxycycline (VIBRA-TABS) 100 MG tablet Take 100 mg by mouth 2 (two) times daily.   [DISCONTINUED] predniSONE (DELTASONE) 10 MG tablet Take by mouth.   No facility-administered encounter medications on file as of 04/15/2022.     Lab Results  Component Value Date   WBC 3.3 (L) 04/11/2022   HGB 12.1 04/11/2022   HCT 37.0 04/11/2022   PLT 159.0 04/11/2022   GLUCOSE 96 04/11/2022   CHOL 149 04/11/2022   TRIG 67.0 04/11/2022   HDL 51.30 04/11/2022   LDLCALC 84 04/11/2022   ALT 18 04/11/2022   AST 24 04/11/2022   NA 139 04/11/2022   K 4.1 04/11/2022   CL 106 04/11/2022   CREATININE 0.73 04/11/2022   BUN 12 04/11/2022   CO2 26 04/11/2022   TSH 0.01 (L) 08/20/2021   INR 0.94 01/07/2017   HGBA1C 5.7 04/11/2022   MICROALBUR 1.2 03/27/2020    CT ABDOMEN PELVIS W CONTRAST  Result Date: 02/07/2022 CLINICAL DATA:  Elevated liver function tests for several years. History of steatosis. Asymptomatic. Cholecystectomy. Tubal ligation. Prior negative liver biopsy 4 years ago. EXAM: CT ABDOMEN AND PELVIS WITH CONTRAST TECHNIQUE: Multidetector CT imaging of the abdomen and pelvis was performed using the standard protocol following bolus administration of intravenous contrast. RADIATION DOSE REDUCTION: This exam was performed according to the departmental dose-optimization program which includes automated exposure control, adjustment of the mA and/or kV according to patient size and/or use of iterative reconstruction technique. CONTRAST:  16m OMNIPAQUE IOHEXOL 300 MG/ML  SOLN COMPARISON:  01/08/2022 abdominal ultrasound. CT stone study 07/28/2015. FINDINGS: Lower chest: Clear lung bases. Normal heart size without pericardial or pleural effusion.  Tiny hiatal hernia. Hepatobiliary: Hepatomegaly at 19.7 cm craniocaudal. Possible mild hepatic steatosis, suboptimally evaluated on contrast enhanced imaging. No cirrhosis. No focal liver lesion. Cholecystectomy, without biliary ductal dilatation. Pancreas: Normal, without mass or ductal dilatation. Spleen: Normal in size, without focal abnormality. Adrenals/Urinary Tract: Normal adrenal glands. Normal kidneys, without hydronephrosis. Normal urinary bladder. Stomach/Bowel: Normal remainder of the stomach. Extensive colonic diverticulosis. Normal terminal ileum. Appendix not visualized. Normal small bowel. Vascular/Lymphatic: Aortic atherosclerosis. Mildly prominent porta hepatis nodes are not pathologic by size criteria and slightly decreased in size compared to 2016, reactive. Example 1.0 cm portacaval node on 24/3. No pelvic sidewall adenopathy. Reproductive: Normal uterus and adnexa. Other: Moderate to marked pelvic floor laxity. No significant free fluid. Small fat containing paraumbilical hernia. There is also a fat containing supraumbilical tiny ventral abdominal wall hernia. Musculoskeletal: Mild right hip osteoarthritis. Degenerate disc disease is most significant at L3-4 and L2-3. Mild convex left lumbar spine curvature. IMPRESSION: 1. Hepatomegaly. Probable hepatic steatosis. Suboptimally evaluated on postcontrast CT. 2.  No acute process in the abdomen or pelvis. 3. Pelvic floor laxity. Electronically Signed   By: KAbigail MiyamotoM.D.   On: 02/07/2022 08:32       Assessment & Plan:   Problem List Items Addressed This Visit     Abnormal liver function tests    S/p previous liver biopsy -- MILD STEATOHEPATITIS, GRADE 1.  Followed by GI.  Follow liver panel.        Asthma    Breathing overall stable - xolair and symbicort.        Family history of cancer  Multiple relatives as outlined.  Had negative genetic testing.        GERD (gastroesophageal reflux disease)    Symptoms as  outlined.  Avoid foods that aggravate and avoid eating late.  Omeprazole.        History of colonic polyps    Saw GI.  Need copy of colonoscopy.        Hypercholesterolemia    On crestor.  Low cholesterol diet and exercise.  Follow lipid panel and liver function tests.         Relevant Orders   CBC with Differential/Platelet   Hepatic function panel   Lipid panel   Hyperglycemia    Low carb diet and exercise.  Follow met b and a1c.  Discussed recent labs.   Lab Results  Component Value Date   HGBA1C 5.7 04/11/2022       Relevant Orders   Hemoglobin A1c   Hypertension    Continue losartan.  Blood pressure as outlined.  Follow pressures.  Follow metabolic panel.        Relevant Orders   Basic metabolic panel   Leukopenia    Follow cbc.        Right shoulder pain    Completed PT.  Continue home exercise.         Stress    Overall appears to be handling things well.  Follow.          Einar Pheasant, MD

## 2022-04-20 ENCOUNTER — Encounter: Payer: Self-pay | Admitting: Internal Medicine

## 2022-04-20 NOTE — Assessment & Plan Note (Signed)
Follow cbc.  

## 2022-04-20 NOTE — Assessment & Plan Note (Signed)
Continue losartan.  Blood pressure as outlined.  Follow pressures.  Follow metabolic panel.  

## 2022-04-20 NOTE — Assessment & Plan Note (Signed)
Low carb diet and exercise.  Follow met b and a1c.  Discussed recent labs.   Lab Results  Component Value Date   HGBA1C 5.7 04/11/2022

## 2022-04-20 NOTE — Assessment & Plan Note (Signed)
Saw GI.  Need copy of colonoscopy.

## 2022-04-20 NOTE — Assessment & Plan Note (Signed)
Breathing overall stable - xolair and symbicort.  

## 2022-04-20 NOTE — Assessment & Plan Note (Signed)
Symptoms as outlined.  Avoid foods that aggravate and avoid eating late.  Omeprazole.

## 2022-04-20 NOTE — Assessment & Plan Note (Signed)
On crestor.  Low cholesterol diet and exercise.  Follow lipid panel and liver function tests.   

## 2022-04-20 NOTE — Assessment & Plan Note (Addendum)
S/p previous liver biopsy -- MILD STEATOHEPATITIS, GRADE 1.  Followed by GI.  Follow liver panel.  

## 2022-04-20 NOTE — Assessment & Plan Note (Signed)
Completed PT.  Continue home exercise.

## 2022-04-20 NOTE — Assessment & Plan Note (Signed)
Overall appears to be handling things well.  Follow.  ?

## 2022-04-20 NOTE — Assessment & Plan Note (Signed)
Multiple relatives as outlined.  Had negative genetic testing.

## 2022-05-06 DIAGNOSIS — L578 Other skin changes due to chronic exposure to nonionizing radiation: Secondary | ICD-10-CM | POA: Diagnosis not present

## 2022-05-06 DIAGNOSIS — L570 Actinic keratosis: Secondary | ICD-10-CM | POA: Diagnosis not present

## 2022-05-06 DIAGNOSIS — L814 Other melanin hyperpigmentation: Secondary | ICD-10-CM | POA: Diagnosis not present

## 2022-05-06 DIAGNOSIS — K130 Diseases of lips: Secondary | ICD-10-CM | POA: Diagnosis not present

## 2022-05-06 DIAGNOSIS — L918 Other hypertrophic disorders of the skin: Secondary | ICD-10-CM | POA: Diagnosis not present

## 2022-05-06 DIAGNOSIS — L821 Other seborrheic keratosis: Secondary | ICD-10-CM | POA: Diagnosis not present

## 2022-05-06 DIAGNOSIS — Z872 Personal history of diseases of the skin and subcutaneous tissue: Secondary | ICD-10-CM | POA: Diagnosis not present

## 2022-06-23 DIAGNOSIS — E0500 Thyrotoxicosis with diffuse goiter without thyrotoxic crisis or storm: Secondary | ICD-10-CM | POA: Diagnosis not present

## 2022-06-23 DIAGNOSIS — M8580 Other specified disorders of bone density and structure, unspecified site: Secondary | ICD-10-CM | POA: Diagnosis not present

## 2022-06-23 DIAGNOSIS — Z6828 Body mass index (BMI) 28.0-28.9, adult: Secondary | ICD-10-CM | POA: Diagnosis not present

## 2022-06-23 DIAGNOSIS — M8589 Other specified disorders of bone density and structure, multiple sites: Secondary | ICD-10-CM | POA: Diagnosis not present

## 2022-07-07 DIAGNOSIS — L570 Actinic keratosis: Secondary | ICD-10-CM | POA: Diagnosis not present

## 2022-07-07 DIAGNOSIS — D485 Neoplasm of uncertain behavior of skin: Secondary | ICD-10-CM | POA: Diagnosis not present

## 2022-07-17 DIAGNOSIS — H43813 Vitreous degeneration, bilateral: Secondary | ICD-10-CM | POA: Diagnosis not present

## 2022-07-24 DIAGNOSIS — J4540 Moderate persistent asthma, uncomplicated: Secondary | ICD-10-CM | POA: Diagnosis not present

## 2022-07-24 DIAGNOSIS — Z23 Encounter for immunization: Secondary | ICD-10-CM | POA: Diagnosis not present

## 2022-07-24 DIAGNOSIS — R0982 Postnasal drip: Secondary | ICD-10-CM | POA: Diagnosis not present

## 2022-08-12 DIAGNOSIS — L570 Actinic keratosis: Secondary | ICD-10-CM | POA: Diagnosis not present

## 2022-08-13 ENCOUNTER — Other Ambulatory Visit (INDEPENDENT_AMBULATORY_CARE_PROVIDER_SITE_OTHER): Payer: PPO

## 2022-08-13 DIAGNOSIS — I10 Essential (primary) hypertension: Secondary | ICD-10-CM

## 2022-08-13 DIAGNOSIS — E7800 Pure hypercholesterolemia, unspecified: Secondary | ICD-10-CM | POA: Diagnosis not present

## 2022-08-13 DIAGNOSIS — R739 Hyperglycemia, unspecified: Secondary | ICD-10-CM

## 2022-08-13 LAB — HEPATIC FUNCTION PANEL
ALT: 20 U/L (ref 0–35)
AST: 27 U/L (ref 0–37)
Albumin: 4.1 g/dL (ref 3.5–5.2)
Alkaline Phosphatase: 59 U/L (ref 39–117)
Bilirubin, Direct: 0.2 mg/dL (ref 0.0–0.3)
Total Bilirubin: 0.6 mg/dL (ref 0.2–1.2)
Total Protein: 7 g/dL (ref 6.0–8.3)

## 2022-08-13 LAB — CBC WITH DIFFERENTIAL/PLATELET
Basophils Absolute: 0 10*3/uL (ref 0.0–0.1)
Basophils Relative: 1.4 % (ref 0.0–3.0)
Eosinophils Absolute: 0.3 10*3/uL (ref 0.0–0.7)
Eosinophils Relative: 8.9 % — ABNORMAL HIGH (ref 0.0–5.0)
HCT: 37.3 % (ref 36.0–46.0)
Hemoglobin: 12.5 g/dL (ref 12.0–15.0)
Lymphocytes Relative: 37.9 % (ref 12.0–46.0)
Lymphs Abs: 1.2 10*3/uL (ref 0.7–4.0)
MCHC: 33.7 g/dL (ref 30.0–36.0)
MCV: 86.8 fl (ref 78.0–100.0)
Monocytes Absolute: 0.4 10*3/uL (ref 0.1–1.0)
Monocytes Relative: 11.3 % (ref 3.0–12.0)
Neutro Abs: 1.3 10*3/uL — ABNORMAL LOW (ref 1.4–7.7)
Neutrophils Relative %: 40.5 % — ABNORMAL LOW (ref 43.0–77.0)
Platelets: 157 10*3/uL (ref 150.0–400.0)
RBC: 4.29 Mil/uL (ref 3.87–5.11)
RDW: 13.8 % (ref 11.5–15.5)
WBC: 3.1 10*3/uL — ABNORMAL LOW (ref 4.0–10.5)

## 2022-08-13 LAB — LIPID PANEL
Cholesterol: 136 mg/dL (ref 0–200)
HDL: 44.1 mg/dL (ref >39.00)
LDL Cholesterol: 77 mg/dL (ref 0–99)
NonHDL: 91.86
Total CHOL/HDL Ratio: 3
Triglycerides: 76 mg/dL (ref 0.0–149.0)
VLDL: 15.2 mg/dL (ref 0.0–40.0)

## 2022-08-13 LAB — BASIC METABOLIC PANEL
BUN: 14 mg/dL (ref 6–23)
CO2: 27 mEq/L (ref 19–32)
Calcium: 9.6 mg/dL (ref 8.4–10.5)
Chloride: 106 mEq/L (ref 96–112)
Creatinine, Ser: 0.66 mg/dL (ref 0.40–1.20)
GFR: 87.38 mL/min (ref >60.00)
Glucose, Bld: 100 mg/dL — ABNORMAL HIGH (ref 70–99)
Potassium: 3.8 mEq/L (ref 3.5–5.1)
Sodium: 139 mEq/L (ref 135–145)

## 2022-08-13 LAB — HEMOGLOBIN A1C: Hgb A1c MFr Bld: 5.9 % (ref 4.6–6.5)

## 2022-08-18 ENCOUNTER — Other Ambulatory Visit: Payer: Self-pay

## 2022-08-18 ENCOUNTER — Ambulatory Visit (INDEPENDENT_AMBULATORY_CARE_PROVIDER_SITE_OTHER): Payer: PPO | Admitting: Internal Medicine

## 2022-08-18 ENCOUNTER — Encounter: Payer: Self-pay | Admitting: Internal Medicine

## 2022-08-18 VITALS — BP 120/72 | HR 82 | Temp 98.3°F | Ht 66.0 in | Wt 169.0 lb

## 2022-08-18 DIAGNOSIS — I10 Essential (primary) hypertension: Secondary | ICD-10-CM | POA: Diagnosis not present

## 2022-08-18 DIAGNOSIS — Z1231 Encounter for screening mammogram for malignant neoplasm of breast: Secondary | ICD-10-CM | POA: Diagnosis not present

## 2022-08-18 DIAGNOSIS — K219 Gastro-esophageal reflux disease without esophagitis: Secondary | ICD-10-CM | POA: Diagnosis not present

## 2022-08-18 DIAGNOSIS — Z86010 Personal history of colonic polyps: Secondary | ICD-10-CM | POA: Diagnosis not present

## 2022-08-18 DIAGNOSIS — R739 Hyperglycemia, unspecified: Secondary | ICD-10-CM

## 2022-08-18 DIAGNOSIS — E7800 Pure hypercholesterolemia, unspecified: Secondary | ICD-10-CM | POA: Diagnosis not present

## 2022-08-18 DIAGNOSIS — J4520 Mild intermittent asthma, uncomplicated: Secondary | ICD-10-CM

## 2022-08-18 DIAGNOSIS — D72819 Decreased white blood cell count, unspecified: Secondary | ICD-10-CM

## 2022-08-18 MED ORDER — LOSARTAN POTASSIUM 100 MG PO TABS: 100.0000 mg | ORAL_TABLET | Freq: Every day | ORAL | 2 refills | Status: DC

## 2022-08-18 MED ORDER — OMEPRAZOLE 10 MG PO CPDR: 10.0000 mg | DELAYED_RELEASE_CAPSULE | Freq: Every day | ORAL | 3 refills | Status: DC

## 2022-08-18 NOTE — Progress Notes (Unsigned)
Patient ID: Chloe Moyer, female   DOB: 1951-01-04, 71 y.o.   MRN: [id number]   Subjective:    Patient ID: Chloe Moyer, female    DOB: 1951/09/03, 71 y.o.   MRN: [id number]   Patient here for  Chief Complaint  Patient presents with   Follow-up    4 month follow up   .   HPI Here to follow up regarding her blood pressure and asthma.  Just saw pulmonary f/u asthma.  Using pulmicort neb twice a month.  Contiue Programmer, applications.     Past Medical History:  Diagnosis Date   Allergic rhinitis    Arthritis    Asthma    Diverticulosis, sigmoid    Family history of breast cancer    Family history of cancer of mouth    Family history of lung cancer    GERD (gastroesophageal reflux disease)    History of hiatal hernia    Hypertension 06/12/2005   Ulcer    Past Surgical History:  Procedure Laterality Date   BREAST BIOPSY Right 09/01/2017   Affirm Bx of 2 areas- both areas were fibroadenomatoid change    BREAST CYST ASPIRATION Left 1990   CARDIAC CATHETERIZATION  July 2012   CHOLECYSTECTOMY  1991   LIPOMA EXCISION  4/99   Left flank area   NASAL SINUS SURGERY  July 2012   TUBAL LIGATION  1984   Family History  Problem Relation Age of Onset   Hypertension Mother    Cancer Mother        oral dx 89   Emphysema Father        smoked   Asthma Father    Lung cancer Father        smoked; dx 67s   Hypertension Father    Cancer - Cervical Paternal Aunt    Cancer Paternal Aunt        stomach v. ovarian   Cancer Paternal Uncle        unk, possibly lung   Breast cancer Maternal Grandmother 60   Stroke Maternal Grandfather    Heart disease Paternal Grandfather        myocardial infarction   Cancer Cousin        unk types   Social History   Socioeconomic History   Marital status: Married    Spouse name: Not on file   Number of children: 2   Years of education: Not on file   Highest education level: Not on file  Occupational History   Occupation: Works at  Environmental education officer  Tobacco Use   Smoking status: Never   Smokeless tobacco: Never  Substance and Sexual Activity   Alcohol use: Yes    Alcohol/week: 0.0 standard drinks of alcohol    Comment: occ wine with dinner   Drug use: No   Sexual activity: Yes  Other Topics Concern   Not on file  Social History Narrative   Not on file   Social Determinants of Health   Financial Resource Strain: Low Risk  (07/26/2020)   Overall Financial Resource Strain (CARDIA)    Difficulty of Paying Living Expenses: Not hard at all  Food Insecurity: No Food Insecurity (07/26/2020)   Hunger Vital Sign    Worried About Running Out of Food in the Last Year: Never true    Ran Out of Food in the Last Year: Never true  Transportation Needs: No Transportation Needs (07/26/2020)   PRAPARE - Transportation    Lack of  Transportation (Medical): No    Lack of Transportation (Non-Medical): No  Physical Activity: Not on file  Stress: No Stress Concern Present (07/26/2020)   [place]    Feeling of Stress : Not at all  Social Connections: [place] (07/26/2020)   Social Connection and Isolation Panel [NHANES]    Frequency of Communication with Friends and Family: More than three times a week    Frequency of Social Gatherings with Friends and Family: More than three times a week    Attends Religious Services: More than 4 times per year    Active Member of [employer] or Organizations: Yes    Attends Music therapist: More than 4 times per year    Marital Status: Married     Review of Systems     Objective:     BP 120/72 (BP Location: Left Arm, Patient Position: Sitting, Cuff Size: Normal)   Pulse 82   Temp 98.3 F (36.8 C) (Oral)   Ht 5' 6"  (1.676 m)   Wt 169 lb (76.7 kg)   LMP 11/03/1996   SpO2 95%   BMI 27.28 kg/m  Wt Readings from Last 3 Encounters:  08/18/22 169 lb (76.7 kg)  04/15/22 167 lb 12.8 oz (76.1 kg)  12/16/21  167 lb 12.8 oz (76.1 kg)    Physical Exam   Outpatient Encounter Medications as of 08/18/2022  Medication Sig   albuterol (PROVENTIL HFA;VENTOLIN HFA) 108 (90 BASE) MCG/ACT inhaler Inhale 2 puffs using inhaler every 4-6 hours as needed for SOB/wheeze.   budesonide (PULMICORT) 0.5 MG/2ML nebulizer solution Take 0.5 mg by nebulization 2 (two) times daily.   budesonide-formoterol (SYMBICORT) 160-4.5 MCG/ACT inhaler Inhale 2 puffs into the lungs daily. as directed.   EPINEPHrine (EPIPEN 2-PAK) 0.3 mg/0.3 mL IJ SOAJ injection Inject 0.3 mLs (0.3 mg total) into the muscle once.   hydrocortisone (ANUSOL-HC) 25 MG suppository Place 1 suppository (25 mg total) rectally 2 (two) times daily as needed.   Hypertonic Nasal Wash (SINUS RINSE NA) As directed three times daily   ipratropium-albuterol (DUONEB) 0.5-2.5 (3) MG/3ML SOLN    omalizumab (XOLAIR) 150 MG injection 150 mg every 30 (thirty) days.    rosuvastatin (CRESTOR) 5 MG tablet TAKE 1 TABLET BY MOUTH ON MONDAY AND THURSDAY   [DISCONTINUED] losartan (COZAAR) 100 MG tablet TAKE 1 TABLET BY MOUTH ONCE DAILY   [DISCONTINUED] omeprazole (PRILOSEC) 10 MG capsule Take 1 capsule (10 mg total) by mouth daily.   No facility-administered encounter medications on file as of 08/18/2022.     Lab Results  Component Value Date   WBC 3.1 (L) 08/13/2022   HGB 12.5 08/13/2022   HCT 37.3 08/13/2022   PLT 157.0 08/13/2022   GLUCOSE 100 (H) 08/13/2022   CHOL 136 08/13/2022   TRIG 76.0 08/13/2022   HDL 44.10 08/13/2022   LDLCALC 77 08/13/2022   ALT 20 08/13/2022   AST 27 08/13/2022   NA 139 08/13/2022   K 3.8 08/13/2022   CL 106 08/13/2022   CREATININE 0.66 08/13/2022   BUN 14 08/13/2022   CO2 27 08/13/2022   TSH 0.01 (L) 08/20/2021   INR 0.94 01/07/2017   HGBA1C 5.9 08/13/2022   MICROALBUR 1.2 03/27/2020    CT ABDOMEN PELVIS W CONTRAST  Result Date: 02/07/2022 CLINICAL DATA:  Elevated liver function tests for several years. History of steatosis.  Asymptomatic. Cholecystectomy. Tubal ligation. Prior negative liver biopsy 4 years ago. EXAM: CT ABDOMEN AND PELVIS WITH  CONTRAST TECHNIQUE: Multidetector CT imaging of the abdomen and pelvis was performed using the standard protocol following bolus administration of intravenous contrast. RADIATION DOSE REDUCTION: This exam was performed according to the departmental dose-optimization program which includes automated exposure control, adjustment of the mA and/or kV according to patient size and/or use of iterative reconstruction technique. CONTRAST:  150m OMNIPAQUE IOHEXOL 300 MG/ML  SOLN COMPARISON:  01/08/2022 abdominal ultrasound. CT stone study 07/28/2015. FINDINGS: Lower chest: Clear lung bases. Normal heart size without pericardial or pleural effusion. Tiny hiatal hernia. Hepatobiliary: Hepatomegaly at 19.7 cm craniocaudal. Possible mild hepatic steatosis, suboptimally evaluated on contrast enhanced imaging. No cirrhosis. No focal liver lesion. Cholecystectomy, without biliary ductal dilatation. Pancreas: Normal, without mass or ductal dilatation. Spleen: Normal in size, without focal abnormality. Adrenals/Urinary Tract: Normal adrenal glands. Normal kidneys, without hydronephrosis. Normal urinary bladder. Stomach/Bowel: Normal remainder of the stomach. Extensive colonic diverticulosis. Normal terminal ileum. Appendix not visualized. Normal small bowel. Vascular/Lymphatic: Aortic atherosclerosis. Mildly prominent porta hepatis nodes are not pathologic by size criteria and slightly decreased in size compared to 2016, reactive. Example 1.0 cm portacaval node on 24/3. No pelvic sidewall adenopathy. Reproductive: Normal uterus and adnexa. Other: Moderate to marked pelvic floor laxity. No significant free fluid. Small fat containing paraumbilical hernia. There is also a fat containing supraumbilical tiny ventral abdominal wall hernia. Musculoskeletal: Mild right hip osteoarthritis. Degenerate disc disease is most  significant at L3-4 and L2-3. Mild convex left lumbar spine curvature. IMPRESSION: 1. Hepatomegaly. Probable hepatic steatosis. Suboptimally evaluated on postcontrast CT. 2.  No acute process in the abdomen or pelvis. 3. Pelvic floor laxity. Electronically Signed   By: KAbigail MiyamotoM.D.   On: 02/07/2022 08:32       Assessment & Plan:   Problem List Items Addressed This Visit   None    CEinar Pheasant MD

## 2022-08-21 ENCOUNTER — Encounter: Payer: Self-pay | Admitting: Internal Medicine

## 2022-08-21 NOTE — Assessment & Plan Note (Signed)
Breathing overall stable - xolair and symbicort.

## 2022-08-21 NOTE — Assessment & Plan Note (Signed)
No upper symptoms reported.  Omeprazole.

## 2022-08-21 NOTE — Assessment & Plan Note (Signed)
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  No f/u recommended (Dr Haig Prophet)

## 2022-08-21 NOTE — Assessment & Plan Note (Signed)
On crestor.  Low cholesterol diet and exercise.  Follow lipid panel and liver function tests.

## 2022-08-21 NOTE — Assessment & Plan Note (Signed)
Follow cbc.  

## 2022-08-21 NOTE — Assessment & Plan Note (Signed)
Continue losartan.  Blood pressure as outlined.  Follow pressures.  Follow metabolic panel.

## 2022-08-21 NOTE — Assessment & Plan Note (Signed)
Low carb diet and exercise.  Follow met b and a1c.  Discussed recent labs.   Lab Results  Component Value Date   HGBA1C 5.9 08/13/2022

## 2022-09-10 ENCOUNTER — Telehealth: Payer: Self-pay | Admitting: Internal Medicine

## 2022-09-10 NOTE — Telephone Encounter (Signed)
Copied from [place] [phone]. Topic: Medicare AWV >> Sep 10, 2022  2:34 PM Devoria Glassing wrote: Reason for CRM: Left message for patient to schedule Annual Wellness Visit.  Please schedule with Nurse Health Advisor Denisa O'Brien-Blaney, LPN at [facility]. This appt can be telephone or office visit.  Please call [phone] ask for [facility]

## 2022-09-12 ENCOUNTER — Ambulatory Visit (INDEPENDENT_AMBULATORY_CARE_PROVIDER_SITE_OTHER): Payer: PPO

## 2022-09-12 VITALS — Ht 66.0 in | Wt 169.0 lb

## 2022-09-12 DIAGNOSIS — Z0000 Encounter for general adult medical examination without abnormal findings: Secondary | ICD-10-CM

## 2022-09-12 NOTE — Progress Notes (Signed)
Subjective:   Chloe Moyer is a 71 y.o. female who presents for Medicare Annual (Subsequent) preventive examination.  Review of Systems    No ROS.  Medicare Wellness Virtual Visit.  Visual/audio telehealth visit, UTA vital signs.   See social history for additional risk factors.   Cardiac Risk Factors include: advanced age (>2mn, >>24women)     Objective:    Today's Vitals   09/12/22 0907  Weight: 169 lb (76.7 kg)  Height: 5' 6"  (1.676 m)   Body mass index is 27.28 kg/m.     09/12/2022    8:48 AM 01/28/2022    8:53 AM 12/26/2020    1:12 PM 07/26/2020    9:30 AM 07/26/2019    9:11 AM 07/20/2018    2:24 PM 07/17/2017    2:00 PM  Advanced Directives  Does Patient Have a Medical Advance Directive? Yes Yes Yes Yes Yes Yes No  Type of AParamedicof [place] will [place]Living will;Out of facility DNR (pink MOST or yellow form)  [place] will [place]Living will [place]Living will   Does patient want to make changes to medical advance directive? No - Patient declined No - Patient declined No - Patient declined No - Patient declined No - Patient declined No - Patient declined   Copy of HGasin Chart? No - copy requested   No - copy requested No - copy requested No - copy requested   Would patient like information on creating a medical advance directive?       No - Patient declined    Current Medications (verified) Outpatient Encounter Medications as of 09/12/2022  Medication Sig   Cholecalciferol (D3) 50 MCG (2000 UT) CHEW Chew 2,000 Units by mouth.   Multiple Vitamins-Minerals (AIRBORNE PO) Take 3 tablets by mouth. Chewable/gummies   albuterol (PROVENTIL HFA;VENTOLIN HFA) 108 (90 BASE) MCG/ACT inhaler Inhale 2 puffs using inhaler every 4-6 hours as needed for SOB/wheeze.   budesonide (PULMICORT) 0.5 MG/2ML nebulizer solution Take 0.5  mg by nebulization 2 (two) times daily.   budesonide-formoterol (SYMBICORT) 160-4.5 MCG/ACT inhaler Inhale 2 puffs into the lungs daily. as directed.   EPINEPHrine (EPIPEN 2-PAK) 0.3 mg/0.3 mL IJ SOAJ injection Inject 0.3 mLs (0.3 mg total) into the muscle once.   hydrocortisone (ANUSOL-HC) 25 MG suppository Place 1 suppository (25 mg total) rectally 2 (two) times daily as needed.   Hypertonic Nasal Wash (SINUS RINSE NA) As directed three times daily   ipratropium-albuterol (DUONEB) 0.5-2.5 (3) MG/3ML SOLN    losartan (COZAAR) 100 MG tablet Take 1 tablet (100 mg total) by mouth daily.   omalizumab (XOLAIR) 150 MG injection 150 mg every 30 (thirty) days.    omeprazole (PRILOSEC) 10 MG capsule Take 1 capsule (10 mg total) by mouth daily.   rosuvastatin (CRESTOR) 5 MG tablet TAKE 1 TABLET BY MOUTH ON MONDAY AND THURSDAY   No facility-administered encounter medications on file as of 09/12/2022.    Allergies (verified) Sulfamethoxazole-trimethoprim, Lisinopril, Septra [bactrim], and Sulfa antibiotics   History: Past Medical History:  Diagnosis Date   Allergic rhinitis    Arthritis    Asthma    Diverticulosis, sigmoid    Family history of breast cancer    Family history of cancer of mouth    Family history of lung cancer    GERD (gastroesophageal reflux disease)    History of hiatal hernia    Hypertension 06/12/2005  Ulcer    Past Surgical History:  Procedure Laterality Date   BREAST BIOPSY Right 09/01/2017   Affirm Bx of 2 areas- both areas were fibroadenomatoid change    BREAST CYST ASPIRATION Left 1990   CARDIAC CATHETERIZATION  July 2012   CHOLECYSTECTOMY  1991   LIPOMA EXCISION  4/99   Left flank area   NASAL SINUS SURGERY  July 2012   TUBAL LIGATION  1984   Family History  Problem Relation Age of Onset   Hypertension Mother    Cancer Mother        oral dx 63   Emphysema Father        smoked   Asthma Father    Lung cancer Father        smoked; dx 21s    Hypertension Father    Cancer - Cervical Paternal Aunt    Cancer Paternal Aunt        stomach v. ovarian   Cancer Paternal Uncle        unk, possibly lung   Breast cancer Maternal Grandmother 60   Stroke Maternal Grandfather    Heart disease Paternal Grandfather        myocardial infarction   Cancer Cousin        unk types   Social History   Socioeconomic History   Marital status: Married    Spouse name: Not on file   Number of children: 2   Years of education: Not on file   Highest education level: Not on file  Occupational History   Occupation: Works at [employer] education officer  Tobacco Use   Smoking status: Never   Smokeless tobacco: Never  Substance and Sexual Activity   Alcohol use: Yes    Alcohol/week: 0.0 standard drinks of alcohol    Comment: occ wine with dinner   Drug use: No   Sexual activity: Yes  Other Topics Concern   Not on file  Social History Narrative   Not on file   Social Determinants of Health   Financial Resource Strain: Low Risk  (09/12/2022)   Overall Financial Resource Strain (CARDIA)    Difficulty of Paying Living Expenses: Not hard at all  Food Insecurity: No Food Insecurity (09/12/2022)   Hunger Vital Sign    Worried About Running Out of Food in the Last Year: Never true    Ran Out of Food in the Last Year: Never true  Transportation Needs: No Transportation Needs (09/12/2022)   PRAPARE - Hydrologist (Medical): No    Lack of Transportation (Non-Medical): No  Physical Activity: Not on file  Stress: No Stress Concern Present (09/12/2022)   [place]    Feeling of Stress : Not at all  Social Connections: [place] (09/12/2022)   Social Connection and Isolation Panel [NHANES]    Frequency of Communication with Friends and Family: More than three times a week    Frequency of Social Gatherings with Friends and Family: More than three times a  week    Attends Religious Services: More than 4 times per year    Active Member of [employer] or Organizations: Yes    Attends Music therapist: More than 4 times per year    Marital Status: Married    Tobacco Counseling Counseling given: Not Answered   Clinical Intake:  Pre-visit preparation completed: Yes        Diabetes: No  How often do you need  to have someone help you when you read instructions, pamphlets, or other written materials from your doctor or pharmacy?: 1 - Never    Interpreter Needed?: No      Activities of Daily Living    09/12/2022    8:50 AM  In your present state of health, do you have any difficulty performing the following activities:  Hearing? 0  Vision? 0  Difficulty concentrating or making decisions? 0  Walking or climbing stairs? 0  Dressing or bathing? 0  Doing errands, shopping? 0  Preparing Food and eating ? N  Using the Toilet? N  In the past six months, have you accidently leaked urine? N  Do you have problems with loss of bowel control? N  Managing your Medications? N  Managing your Finances? N  Housekeeping or managing your Housekeeping? N    Patient Care Team: Einar Pheasant, MD as PCP - General (Internal Medicine) Beverly Gust, MD as Referring Physician (Unknown Physician Specialty) Powers, Eloy End, MD as Referring Physician (Pulmonary Disease) Senior, Michel Santee, MD as Referring Physician (Otolaryngology)  Indicate any recent Medical Services you may have received from other than [facility] providers in the past year (date may be approximate).     Assessment:   This is a routine wellness examination for Chloe Moyer.  I connected with  Johnnye Sima on 09/12/22 by a audio enabled telemedicine application and verified that I am speaking with the correct person using two identifiers.  Patient Location: Home  Provider Location: Office/Clinic  I discussed the limitations of evaluation and management by  telemedicine. The patient expressed understanding and agreed to proceed.   Hearing/Vision screen Hearing Screening - Comments:: Patient is able to hear conversational tones without difficulty. No issues reported. Vision Screening - Comments:: Followed by [facility]  Wears corrective lenses  They have seen their ophthalmologist in the last 12 months  Dietary issues and exercise activities discussed: Current Exercise Habits: Home exercise routine, Type of exercise: walking, Intensity: Mild   Goals Addressed             This Visit's Progress    Follow up with Primary Care Provider       As needed.     Healthy Lifestyle   On track    Stay active Healthy diet       Depression Screen    09/12/2022    8:47 AM 08/18/2022   10:29 AM 04/15/2022    1:29 PM 08/26/2021   11:21 AM 10/24/2020    8:25 AM 07/26/2020    9:06 AM 03/29/2020    9:44 AM  PHQ 2/9 Scores  PHQ - 2 Score 0 0 0 0 0 0 0    Fall Risk    09/12/2022    8:50 AM 08/18/2022   10:29 AM 04/15/2022    1:29 PM 08/26/2021   11:20 AM 11/07/2020   12:39 PM  [place] in the past year? 0 0 0 0 1  Number falls in past yr: 0   0 0  Injury with Fall? 0   0 0  Risk for fall due to : No Fall Risks No Fall Risks No Fall Risks    Follow up Falls evaluation completed Falls evaluation completed Falls evaluation completed Falls evaluation completed Falls evaluation completed    [place]: Home free of loose throw rugs in walkways, pet beds, electrical cords, etc? Yes  Adequate lighting in your home to reduce  risk of falls? Yes   ASSISTIVE DEVICES UTILIZED TO PREVENT FALLS: Life alert? No  Use of a cane, walker or w/c? No  Grab bars in the bathroom? Yes  Shower chair or bench in shower? No  Elevated toilet seat or a handicapped toilet? Yes   TIMED UP AND GO: Was the test performed? No .   Cognitive Function:    07/20/2018    2:24 PM 07/17/2017    2:03 PM 07/15/2016    11:37 AM 03/21/2015   10:37 AM  MMSE - Mini Mental State Exam  Orientation to time 5 5 5 5   Orientation to Place 5 5 5 5   Registration 3 3 3 3   Attention/ Calculation 5 5 5 5   Recall 3 3 3 3   Language- name 2 objects 2 2 2 2   Language- repeat 1 1 1 1   Language- follow 3 step command 3 3 3 3   Language- read & follow direction 1 1 1 1   Write a sentence 1 1 1 1   Copy design 1 1 1 1   Total score 30 30 30 30         09/12/2022    9:05 AM 07/26/2019    9:25 AM  6CIT Screen  What Year? 0 points 0 points  What month? 0 points 0 points  What time? 0 points 0 points  Count back from 20 0 points 0 points  Months in reverse 0 points 0 points  Repeat phrase 0 points 0 points  Total Score 0 points 0 points    Immunizations Immunization History  Administered Date(s) Administered   Fluad Quad(high Dose 65+) 07/26/2019, 07/24/2022   Influenza Split 09/27/2012, 09/27/2012   Influenza, High Dose Seasonal PF 07/17/2017, 08/26/2018, 07/31/2020   Influenza,inj,Quad PF,6+ Mos 07/28/2014, 08/21/2015   Influenza-Unspecified 11/28/2011, 08/21/2015, 07/08/2016   PFIZER(Purple Top)SARS-COV-2 Vaccination 12/26/2019, 01/20/2020, 08/27/2020   Pneumococcal Conjugate-13 03/22/2015   Pneumococcal Polysaccharide-23 07/15/2016   Tdap 04/28/2013   Zoster Recombinat (Shingrix) 07/21/2018   Zoster, Live 02/23/2014   Covid-19 vaccine status: Completed vaccines x3.  Shingles vaccine- declined per patient.  Screening Tests Health Maintenance  Topic Date Due   COVID-19 Vaccine (4 - Pfizer risk series) 09/28/2022 (Originally 10/22/2020)   TETANUS/TDAP  04/29/2023   Medicare Annual Wellness (AWV)  09/13/2023   MAMMOGRAM  10/24/2023   COLONOSCOPY (Pts 45-67yr Insurance coverage will need to be confirmed)  03/28/2027   Pneumonia Vaccine 71 Years old  Completed   INFLUENZA VACCINE  Completed   DEXA SCAN  Completed   Hepatitis C Screening  Completed   HPV VACCINES  Aged Out   Zoster Vaccines- Shingrix   Discontinued    Health Maintenance There are no preventive care reminders to display for this patient.  Colonoscopy- completed 03/27/22.   Mammogram- scheduled 10/24/22.  Lung Cancer Screening: (Low Dose CT Chest recommended if Age 73-80years, 30 pack-year currently smoking OR have quit w/in 15years.) does not qualify.   Hepatitis C Screening: Completed 2016.  Vision Screening: Recommended annual ophthalmology exams for early detection of glaucoma and other disorders of the eye.  Dental Screening: Recommended annual dental exams for proper oral hygiene.  Community Resource Referral / Chronic Care Management: CRR required this visit?  No   CCM required this visit?  No      Plan:     I have personally reviewed and noted the following in the patient's chart:   Medical and social history Use of alcohol, tobacco or illicit drugs  Current medications  and supplements including opioid prescriptions. Patient is not currently taking opioid prescriptions. Functional ability and status Nutritional status Physical activity Advanced directives List of other physicians Hospitalizations, surgeries, and ER visits in previous 12 months Vitals Screenings to include cognitive, depression, and falls Referrals and appointments  In addition, I have reviewed and discussed with patient certain preventive protocols, quality metrics, and best practice recommendations. A written personalized care plan for preventive services as well as general preventive health recommendations were provided to patient.     Leta Jungling, LPN   [id number]

## 2022-09-12 NOTE — Patient Instructions (Addendum)
Chloe Moyer , Thank you for taking time to come for your Medicare Wellness Visit. I appreciate your ongoing commitment to your health goals. Please review the following plan we discussed and let me know if I can assist you in the future.   These are the goals we discussed:  Goals      Follow up with Primary Care Provider     As needed.     Healthy Lifestyle     Stay active Healthy diet        This is a list of the screening recommended for you and due dates:  Health Maintenance  Topic Date Due   COVID-19 Vaccine (4 - Pfizer risk series) 09/28/2022*   Tetanus Vaccine  04/29/2023   Medicare Annual Wellness Visit  09/13/2023   Mammogram  10/24/2023   Colon Cancer Screening  03/28/2027   Pneumonia Vaccine  Completed   Flu Shot  Completed   DEXA scan (bone density measurement)  Completed   Hepatitis C Screening: USPSTF Recommendation to screen - Ages 64-79 yo.  Completed   HPV Vaccine  Aged Out   Zoster (Shingles) Vaccine  Discontinued  *Topic was postponed. The date shown is not the original due date.    Advanced directives: End of life planning; Advance aging; Advanced directives discussed.  Copy of current HCPOA/Living Will requested.    Conditions/risks identified: none new  Next appointment: Follow up in one year for your annual wellness visit.  Preventive Care 18 Years and Older, Female Preventive care refers to lifestyle choices and visits with your health care provider that can promote health and wellness. What does preventive care include? A yearly physical exam. This is also called an annual well check. Dental exams once or twice a year. Routine eye exams. Ask your health care provider how often you should have your eyes checked. Personal lifestyle choices, including: Daily care of your teeth and gums. Regular physical activity. Eating a healthy diet. Avoiding tobacco and drug use. Limiting alcohol use. Practicing safe sex. Taking low-dose aspirin every  day. Taking vitamin and mineral supplements as recommended by your health care provider. What happens during an annual well check? The services and screenings done by your health care provider during your annual well check will depend on your age, overall health, lifestyle risk factors, and family history of disease. Counseling  Your health care provider may ask you questions about your: Alcohol use. Tobacco use. Drug use. Emotional well-being. Home and relationship well-being. Sexual activity. Eating habits. History of falls. Memory and ability to understand (cognition). Work and work Statistician. Reproductive health. Screening  You may have the following tests or measurements: Height, weight, and BMI. Blood pressure. Lipid and cholesterol levels. These may be checked every 5 years, or more frequently if you are over 8 years old. Skin check. Lung cancer screening. You may have this screening every year starting at age 24 if you have a 30-pack-year history of smoking and currently smoke or have quit within the past 15 years. Fecal occult blood test (FOBT) of the stool. You may have this test every year starting at age 34. Flexible sigmoidoscopy or colonoscopy. You may have a sigmoidoscopy every 5 years or a colonoscopy every 10 years starting at age 70. Hepatitis C blood test. Hepatitis B blood test. Sexually transmitted disease (STD) testing. Diabetes screening. This is done by checking your blood sugar (glucose) after you have not eaten for a while (fasting). You may have this done every 1-3 years. Bone  density scan. This is done to screen for osteoporosis. You may have this done starting at age 75. Mammogram. This may be done every 1-2 years. Talk to your health care provider about how often you should have regular mammograms. Talk with your health care provider about your test results, treatment options, and if necessary, the need for more tests. Vaccines  Your health care  provider may recommend certain vaccines, such as: Influenza vaccine. This is recommended every year. Tetanus, diphtheria, and acellular pertussis (Tdap, Td) vaccine. You may need a Td booster every 10 years. Zoster vaccine. You may need this after age 27. Pneumococcal 13-valent conjugate (PCV13) vaccine. One dose is recommended after age 37. Pneumococcal polysaccharide (PPSV23) vaccine. One dose is recommended after age 76. Talk to your health care provider about which screenings and vaccines you need and how often you need them. This information is not intended to replace advice given to you by your health care provider. Make sure you discuss any questions you have with your health care provider. Document Released: 11/30/2015 Document Revised: 07/23/2016 Document Reviewed: 09/04/2015 Elsevier Interactive Patient Education  2017 [place] Prevention in the Home Falls can cause injuries. They can happen to people of all ages. There are many things you can do to make your home safe and to help prevent falls. What can I do on the outside of my home? Regularly fix the edges of walkways and driveways and fix any cracks. Remove anything that might make you trip as you walk through a door, such as a raised step or threshold. Trim any bushes or trees on the path to your home. Use bright outdoor lighting. Clear any walking paths of anything that might make someone trip, such as rocks or tools. Regularly check to see if handrails are loose or broken. Make sure that both sides of any steps have handrails. Any raised decks and porches should have guardrails on the edges. Have any leaves, snow, or ice cleared regularly. Use sand or salt on walking paths during winter. Clean up any spills in your garage right away. This includes oil or grease spills. What can I do in the bathroom? Use night lights. Install grab bars by the toilet and in the tub and shower. Do not use towel bars as grab  bars. Use non-skid mats or decals in the tub or shower. If you need to sit down in the shower, use a plastic, non-slip stool. Keep the floor dry. Clean up any water that spills on the floor as soon as it happens. Remove soap buildup in the tub or shower regularly. Attach bath mats securely with double-sided non-slip rug tape. Do not have throw rugs and other things on the floor that can make you trip. What can I do in the bedroom? Use night lights. Make sure that you have a light by your bed that is easy to reach. Do not use any sheets or blankets that are too big for your bed. They should not hang down onto the floor. Have a firm chair that has side arms. You can use this for support while you get dressed. Do not have throw rugs and other things on the floor that can make you trip. What can I do in the kitchen? Clean up any spills right away. Avoid walking on wet floors. Keep items that you use a lot in easy-to-reach places. If you need to reach something above you, use a strong step stool that has a grab bar. Keep  electrical cords out of the way. Do not use floor polish or wax that makes floors slippery. If you must use wax, use non-skid floor wax. Do not have throw rugs and other things on the floor that can make you trip. What can I do with my stairs? Do not leave any items on the stairs. Make sure that there are handrails on both sides of the stairs and use them. Fix handrails that are broken or loose. Make sure that handrails are as long as the stairways. Check any carpeting to make sure that it is firmly attached to the stairs. Fix any carpet that is loose or worn. Avoid having throw rugs at the top or bottom of the stairs. If you do have throw rugs, attach them to the floor with carpet tape. Make sure that you have a light switch at the top of the stairs and the bottom of the stairs. If you do not have them, ask someone to add them for you. What else can I do to help prevent  falls? Wear shoes that: Do not have high heels. Have rubber bottoms. Are comfortable and fit you well. Are closed at the toe. Do not wear sandals. If you use a stepladder: Make sure that it is fully opened. Do not climb a closed stepladder. Make sure that both sides of the stepladder are locked into place. Ask someone to hold it for you, if possible. Clearly mark and make sure that you can see: Any grab bars or handrails. First and last steps. Where the edge of each step is. Use tools that help you move around (mobility aids) if they are needed. These include: Canes. Walkers. Scooters. Crutches. Turn on the lights when you go into a dark area. Replace any light bulbs as soon as they burn out. Set up your furniture so you have a clear path. Avoid moving your furniture around. If any of your floors are uneven, fix them. If there are any pets around you, be aware of where they are. Review your medicines with your doctor. Some medicines can make you feel dizzy. This can increase your chance of falling. Ask your doctor what other things that you can do to help prevent falls. This information is not intended to replace advice given to you by your health care provider. Make sure you discuss any questions you have with your health care provider. Document Released: 08/30/2009 Document Revised: 04/10/2016 Document Reviewed: 12/08/2014 Elsevier Interactive Patient Education  2017 [employer].

## 2022-09-16 ENCOUNTER — Telehealth: Payer: Self-pay | Admitting: *Deleted

## 2022-09-16 NOTE — Patient Outreach (Signed)
  Care Coordination   Initial Visit Note   09/16/2022 Name: Chloe Moyer MRN: [id number] DOB: 1951-06-03  Chloe Moyer is a 71 y.o. year old female who sees Einar Pheasant, MD for primary care. I spoke with  Chloe Moyer by phone today.  What matters to the patients health and wellness today?  My joints hurt ache a lot. I have been through physical therapy  and would like to look at my diet to see if certain foods I need to avoid would help. I have Chronic allergic asthma and fungal sinusitis that affects me a lot.     Goals Addressed             This Visit's Progress    Foods to help decrease the pain in joints            SDOH assessments and interventions completed:  Yes     Care Coordination Interventions Activated:  Yes  Care Coordination Interventions:  Yes, provided   Follow up plan: Referral made to Quinn Plowman 10/16/2022 2:30 PM    Encounter Outcome:  Pt. Visit Completed   [place] [facility] [phone]

## 2022-10-02 DIAGNOSIS — H524 Presbyopia: Secondary | ICD-10-CM | POA: Diagnosis not present

## 2022-10-03 DIAGNOSIS — K7581 Nonalcoholic steatohepatitis (NASH): Secondary | ICD-10-CM | POA: Diagnosis not present

## 2022-10-07 DIAGNOSIS — K7581 Nonalcoholic steatohepatitis (NASH): Secondary | ICD-10-CM | POA: Diagnosis not present

## 2022-10-16 ENCOUNTER — Ambulatory Visit: Payer: Self-pay

## 2022-10-16 NOTE — Patient Outreach (Signed)
  Care Coordination   10/16/2022 Name: CASIE STURGEON MRN: [id number] DOB: 1951/02/12   Care Coordination Outreach Attempts:  An unsuccessful telephone outreach was attempted for a scheduled appointment today. Unable to reach patient. HIPAA compliant voice message left with call back phone number.   Follow Up Plan:  Additional outreach attempts will be made to offer the patient care coordination information and services.   Encounter Outcome:  No Answer   Care Coordination Interventions:  No, not indicated    Quinn Plowman [facility] [place] [phone] direct line

## 2022-10-24 ENCOUNTER — Ambulatory Visit
Admission: RE | Admit: 2022-10-24 | Discharge: 2022-10-24 | Disposition: A | Discharge status: Home / Self Care | Payer: PPO | Source: Ambulatory Visit | Attending: Internal Medicine | Admitting: Internal Medicine

## 2022-10-24 DIAGNOSIS — Z1231 Encounter for screening mammogram for malignant neoplasm of breast: Secondary | ICD-10-CM | POA: Insufficient documentation

## 2022-10-27 ENCOUNTER — Telehealth: Payer: Self-pay

## 2022-10-27 ENCOUNTER — Ambulatory Visit: Payer: Self-pay

## 2022-10-27 NOTE — Patient Outreach (Signed)
  Care Coordination   10/27/2022 Name: TAYLIE HELDER MRN: [id number] DOB: July 09, 1951   Care Coordination Outreach Attempts:  An unsuccessful telephone outreach was attempted for a scheduled appointment today. HIPAA compliant voice message left with call back phone number and return call request.   Follow Up Plan:  Additional outreach attempts will be made to offer the patient care coordination information and services.   Encounter Outcome:  No Answer   Care Coordination Interventions:  No, not indicated    Quinn Plowman [facility] [place] [phone] direct line

## 2022-10-27 NOTE — Patient Outreach (Signed)
  Care Coordination   Follow Up Visit Note   10/27/2022 Name: Chloe Moyer MRN: [id number] DOB: 05/23/1951  Chloe Moyer is a 71 y.o. year old female who sees Einar Pheasant, MD for primary care. I spoke with  Johnnye Sima by phone today.  What matters to the patients health and wellness today?  "Foods that help with joint pain. "    Goals Addressed             This Visit's Progress    Foods to help decrease the pain in joints       Care Coordination Interventions: Evaluation of current treatment plan related to joint pain and patient's adherence to plan as established by provider.  Patient states follows up regularly with her providers and exercises by walking and stretching. Discussed healthy food choices with patient that help to fight against inflammation ( Berries, fatty fish, nuts leafy greens, oatmeal and olive oil). Discussed that ongoing exercise helps decrease inflammation.  Offered to send patient education article regarding joint pain management Assessed patients pain level which she reports is a 3 out of 10 today.  Reviewed medications with patient and discussed *importance of compliance.  Reviewed scheduled/upcoming provider appointments. Per chart review patient is scheduled for her physical 12/22/22 with primary care provider.  Discussed plans with patient for ongoing care management follow up and provided patient with direct contact information for care management team.  Next follow up telephone visit with RNCM is 12/09/22 Education article sent to patient on Joint pain management.            SDOH assessments and interventions completed:  No     Care Coordination Interventions:  Yes, provided   Follow up plan: Follow up call scheduled for 12/09/22    Encounter Outcome:  Pt. Visit Completed   Quinn Plowman RN,BSN,CCM [place] [phone] direct line

## 2022-10-27 NOTE — Patient Instructions (Addendum)
Visit Information  Thank you for taking time to visit with me today. Please don't hesitate to contact me if I can be of assistance to you.   Following are the goals we discussed today:   Goals Addressed             This Visit's Progress    Foods to help decrease the pain in joints       Care Coordination Interventions: Evaluation of current treatment plan related to joint pain and patient's adherence to plan as established by provider.  Patient states follows up regularly with her providers and exercises by walking and stretching. Discussed healthy food choices with patient that help to fight against inflammation ( Berries, fatty fish, nuts leafy greens, oatmeal and olive oil). Discussed that ongoing exercise helps decrease inflammation.  Assessed patients pain level which she reports is a 3 out of 10 today. Offered to send patient education article regarding joint pain management.    Reviewed medications with patient and discussed *importance of compliance.  Reviewed scheduled/upcoming provider appointments. Per chart review patient is scheduled for her physical 12/22/22 with primary care provider.  Discussed plans with patient for ongoing care management follow up and provided patient with direct contact information for care management team.           Our next appointment is by telephone on 12/09/22 at 10 am  Please call the care guide team at [phone] if you need to cancel or reschedule your appointment.   If you are experiencing a Mental Health or [place] or need someone to talk to, please call the Suicide and Crisis Lifeline: 988 call 1-800-273-TALK (toll free, 24 hour hotline)  Patient verbalizes understanding of instructions and care plan provided today and agrees to view in [place]. Active MyChart status and patient understanding of how to access instructions and care plan via MyChart confirmed with patient.     Quinn Plowman RN,BSN,CCM [place]  Coordination [phone] direct line

## 2022-11-25 DIAGNOSIS — L298 Other pruritus: Secondary | ICD-10-CM | POA: Diagnosis not present

## 2022-11-25 DIAGNOSIS — L821 Other seborrheic keratosis: Secondary | ICD-10-CM | POA: Diagnosis not present

## 2022-11-28 DIAGNOSIS — M8589 Other specified disorders of bone density and structure, multiple sites: Secondary | ICD-10-CM | POA: Diagnosis not present

## 2022-11-28 DIAGNOSIS — E0500 Thyrotoxicosis with diffuse goiter without thyrotoxic crisis or storm: Secondary | ICD-10-CM | POA: Diagnosis not present

## 2022-11-28 DIAGNOSIS — I10 Essential (primary) hypertension: Secondary | ICD-10-CM | POA: Diagnosis not present

## 2022-12-01 ENCOUNTER — Encounter: Payer: Self-pay | Admitting: Internal Medicine

## 2022-12-01 ENCOUNTER — Telehealth: Payer: Self-pay | Admitting: Internal Medicine

## 2022-12-01 NOTE — Telephone Encounter (Signed)
See my chart message

## 2022-12-01 NOTE — Telephone Encounter (Signed)
Pt called stating she saw her endocrinology at [facility] and they stated she has a high heart rate. Pt would like to be called

## 2022-12-02 ENCOUNTER — Ambulatory Visit: Payer: PPO | Admitting: Family Medicine

## 2022-12-03 ENCOUNTER — Ambulatory Visit: Payer: PPO | Admitting: Family Medicine

## 2022-12-04 ENCOUNTER — Encounter: Payer: Self-pay | Admitting: Nurse Practitioner

## 2022-12-04 ENCOUNTER — Ambulatory Visit (INDEPENDENT_AMBULATORY_CARE_PROVIDER_SITE_OTHER): Payer: PPO | Admitting: Nurse Practitioner

## 2022-12-04 VITALS — BP 122/64 | HR 85 | Temp 97.8°F | Ht 66.0 in | Wt 172.6 lb

## 2022-12-04 DIAGNOSIS — F419 Anxiety disorder, unspecified: Secondary | ICD-10-CM | POA: Insufficient documentation

## 2022-12-04 NOTE — Progress Notes (Signed)
Tomasita Morrow, NP-C Phone: [phone]  Chloe Moyer is a 72 y.o. female who presents today for concern for elevated heart rate.   Patient was referred by her Endocrinologist due to elevated blood pressure (152/96) and pulse (97) in their office last week. Patient reports she was anxious when she had [place] there [place] and [place]. She also reports feeling more anxious recently. She reports a large increase in her caffeine intake, as she has been drinking regular coffee instead of her normal decaf. Denies chest pain. Denies shortness of breath. Denies palpitations.   BP Readings from Last 3 Encounters:  12/04/22 122/64  08/18/22 120/72  04/20/22 128/84   Pulse Readings from Last 3 Encounters:  12/04/22 85  08/18/22 82  04/15/22 97    Social History   Tobacco Use  Smoking Status Never  Smokeless Tobacco Never    Current Outpatient Medications on File Prior to Visit  Medication Sig Dispense Refill   albuterol (PROVENTIL HFA;VENTOLIN HFA) 108 (90 BASE) MCG/ACT inhaler Inhale 2 puffs using inhaler every 4-6 hours as needed for SOB/wheeze.     budesonide (PULMICORT) 0.5 MG/2ML nebulizer solution Take 0.5 mg by nebulization 2 (two) times daily.     budesonide-formoterol (SYMBICORT) 160-4.5 MCG/ACT inhaler Inhale 2 puffs into the lungs daily. as directed. 1 Inhaler 2   Cholecalciferol (D3) 50 MCG (2000 UT) CHEW Chew 2,000 Units by mouth.     EPINEPHrine (EPIPEN 2-PAK) 0.3 mg/0.3 mL IJ SOAJ injection Inject 0.3 mLs (0.3 mg total) into the muscle once. 1 Device 1   hydrocortisone (ANUSOL-HC) 25 MG suppository Place 1 suppository (25 mg total) rectally 2 (two) times daily as needed. 14 suppository 0   Hypertonic Nasal Wash (SINUS RINSE NA) As directed three times daily     ipratropium-albuterol (DUONEB) 0.5-2.5 (3) MG/3ML SOLN      losartan (COZAAR) 100 MG tablet Take 1 tablet (100 mg total) by mouth daily. 30 tablet 2   Multiple Vitamins-Minerals (AIRBORNE  PO) Take 3 tablets by mouth. Chewable/gummies     omalizumab (XOLAIR) 150 MG injection 150 mg every 30 (thirty) days.      omeprazole (PRILOSEC) 10 MG capsule Take 1 capsule (10 mg total) by mouth daily. 30 capsule 3   rosuvastatin (CRESTOR) 5 MG tablet TAKE 1 TABLET BY MOUTH ON MONDAY AND THURSDAY 28 tablet 1   No current facility-administered medications on file prior to visit.     ROS see history of present illness  Objective  Physical Exam Vitals:   12/04/22 1452  BP: 122/64  Pulse: 85  Temp: 97.8 F (36.6 C)  SpO2: 97%    BP Readings from Last 3 Encounters:  12/04/22 122/64  08/18/22 120/72  04/20/22 128/84   Wt Readings from Last 3 Encounters:  12/04/22 172 lb 9.6 oz (78.3 kg)  09/12/22 169 lb (76.7 kg)  08/18/22 169 lb (76.7 kg)    Physical Exam Constitutional:      General: She is not in acute distress.    Appearance: Normal appearance.  HENT:     Head: Normocephalic.  Cardiovascular:     Rate and Rhythm: Normal rate and regular rhythm.     Heart sounds: Normal heart sounds.  Pulmonary:     Effort: Pulmonary effort is normal.     Breath sounds: Normal breath sounds.  Skin:    General: Skin is warm and dry.  Neurological:     General: No focal deficit present.  Mental Status: She is alert.  Psychiatric:        Mood and Affect: Mood normal.        Behavior: Behavior normal.    Assessment/Plan: Please see individual problem list.  Anxiousness Assessment & Plan: Blood pressure and pulse normal today in office. Discussed with patient likely due to anxiety and increased caffeine intake. Recommended decreasing caffeine and practicing deep breathing and relaxation techniques. Information about managing anxiety provided to patient. Advised patient to return if anxiety is worsening or heart rate becomes regularly elevated.     Return if symptoms worsen or fail to improve.   Tomasita Morrow, NP-C [place]

## 2022-12-04 NOTE — Assessment & Plan Note (Addendum)
Blood pressure and pulse normal today in office. Discussed with patient likely due to anxiety and increased caffeine intake. Recommended decreasing caffeine and practicing deep breathing and relaxation techniques. Information about managing anxiety provided to patient. Advised patient to return if anxiety is worsening or heart rate becomes regularly elevated.

## 2022-12-09 ENCOUNTER — Ambulatory Visit: Payer: Self-pay

## 2022-12-09 NOTE — Patient Outreach (Signed)
  Care Coordination   Follow Up Visit Note   12/09/2022 Name: Chloe Moyer MRN: [id number] DOB: May 20, 1951  Chloe Moyer is a 72 y.o. year old female who sees Einar Pheasant, MD for primary care. I spoke with  Chloe Moyer by phone today.  What matters to the patients health and wellness today?  Patient states she has the information she needs at this time and agrees with closure to care coordination program.     Goals Addressed             This Visit's Progress    COMPLETED: Foods to help decrease the pain in joints       Care Coordination Interventions: Evaluation of current treatment plan related to joint pain and patient's adherence to plan as established by provider.  Patient states she feels she has the information she needs regarding anti-inflammatory food options.   Patient states her TSH levels go up and down and she wants to make sure she is eating the right foods that can help with this.  Reviewed medications with patient and discussed *importance of compliance.  Reviewed scheduled/upcoming provider appointments. Discussed food items/ choices that help with managing her TSH level.  Discussed options such as extra virgin olive oil, avocados, oily fish, flaxseed oil.   Discussed plans with patient for ongoing care management follow up:  Patient states she has all of the information she needs at this time and verbally agreed to closing out of care coordination program.               SDOH assessments and interventions completed:  No     Care Coordination Interventions:  Yes, provided   Follow up plan: No further intervention required.   Encounter Outcome:  Pt. Visit Completed   Quinn Plowman RN,BSN,CCM [place] [phone] direct line

## 2022-12-17 ENCOUNTER — Other Ambulatory Visit (INDEPENDENT_AMBULATORY_CARE_PROVIDER_SITE_OTHER): Payer: PPO

## 2022-12-17 DIAGNOSIS — E7800 Pure hypercholesterolemia, unspecified: Secondary | ICD-10-CM | POA: Diagnosis not present

## 2022-12-17 DIAGNOSIS — D72819 Decreased white blood cell count, unspecified: Secondary | ICD-10-CM | POA: Diagnosis not present

## 2022-12-17 DIAGNOSIS — R739 Hyperglycemia, unspecified: Secondary | ICD-10-CM | POA: Diagnosis not present

## 2022-12-17 LAB — HEPATIC FUNCTION PANEL
ALT: 16 U/L (ref 0–35)
AST: 21 U/L (ref 0–37)
Albumin: 4.2 g/dL (ref 3.5–5.2)
Alkaline Phosphatase: 62 U/L (ref 39–117)
Bilirubin, Direct: 0.1 mg/dL (ref 0.0–0.3)
Total Bilirubin: 0.6 mg/dL (ref 0.2–1.2)
Total Protein: 7.4 g/dL (ref 6.0–8.3)

## 2022-12-17 LAB — CBC WITH DIFFERENTIAL/PLATELET
Basophils Absolute: 0.1 10*3/uL (ref 0.0–0.1)
Basophils Relative: 1.3 % (ref 0.0–3.0)
Eosinophils Absolute: 0.2 10*3/uL (ref 0.0–0.7)
Eosinophils Relative: 4.6 % (ref 0.0–5.0)
HCT: 38.2 % (ref 36.0–46.0)
Hemoglobin: 13.1 g/dL (ref 12.0–15.0)
Lymphocytes Relative: 36 % (ref 12.0–46.0)
Lymphs Abs: 1.4 10*3/uL (ref 0.7–4.0)
MCHC: 34.4 g/dL (ref 30.0–36.0)
MCV: 86.4 fl (ref 78.0–100.0)
Monocytes Absolute: 0.5 10*3/uL (ref 0.1–1.0)
Monocytes Relative: 11.7 % (ref 3.0–12.0)
Neutro Abs: 1.8 10*3/uL (ref 1.4–7.7)
Neutrophils Relative %: 46.4 % (ref 43.0–77.0)
Platelets: 198 10*3/uL (ref 150.0–400.0)
RBC: 4.43 Mil/uL (ref 3.87–5.11)
RDW: 14.6 % (ref 11.5–15.5)
WBC: 3.9 10*3/uL — ABNORMAL LOW (ref 4.0–10.5)

## 2022-12-17 LAB — LIPID PANEL
Cholesterol: 150 mg/dL (ref 0–200)
HDL: 49.2 mg/dL (ref >39.00)
LDL Cholesterol: 84 mg/dL (ref 0–99)
NonHDL: 101.01
Total CHOL/HDL Ratio: 3
Triglycerides: 87 mg/dL (ref 0.0–149.0)
VLDL: 17.4 mg/dL (ref 0.0–40.0)

## 2022-12-17 LAB — BASIC METABOLIC PANEL
BUN: 12 mg/dL (ref 6–23)
CO2: 26 mEq/L (ref 19–32)
Calcium: 10 mg/dL (ref 8.4–10.5)
Chloride: 106 mEq/L (ref 96–112)
Creatinine, Ser: 0.69 mg/dL (ref 0.40–1.20)
GFR: 86.24 mL/min (ref >60.00)
Glucose, Bld: 95 mg/dL (ref 70–99)
Potassium: 4.2 mEq/L (ref 3.5–5.1)
Sodium: 139 mEq/L (ref 135–145)

## 2022-12-17 LAB — HEMOGLOBIN A1C: Hgb A1c MFr Bld: 5.8 % (ref 4.6–6.5)

## 2022-12-18 ENCOUNTER — Telehealth: Payer: Self-pay | Admitting: Internal Medicine

## 2022-12-18 NOTE — Telephone Encounter (Signed)
Patient was wondering why her TSH was not checked. She just had it checked 1/12. Advised too soon to recheck. Will discuss labs on 2/5 at her appt.

## 2022-12-18 NOTE — Telephone Encounter (Signed)
Pt would like to be called regarding her lab work

## 2022-12-22 ENCOUNTER — Ambulatory Visit (INDEPENDENT_AMBULATORY_CARE_PROVIDER_SITE_OTHER): Payer: PPO | Admitting: Internal Medicine

## 2022-12-22 ENCOUNTER — Encounter: Payer: Self-pay | Admitting: Internal Medicine

## 2022-12-22 VITALS — BP 130/70 | HR 88 | Temp 98.2°F | Ht 66.0 in | Wt 167.8 lb

## 2022-12-22 DIAGNOSIS — F439 Reaction to severe stress, unspecified: Secondary | ICD-10-CM | POA: Diagnosis not present

## 2022-12-22 DIAGNOSIS — J4520 Mild intermittent asthma, uncomplicated: Secondary | ICD-10-CM

## 2022-12-22 DIAGNOSIS — J309 Allergic rhinitis, unspecified: Secondary | ICD-10-CM

## 2022-12-22 DIAGNOSIS — Z0000 Encounter for general adult medical examination without abnormal findings: Secondary | ICD-10-CM | POA: Diagnosis not present

## 2022-12-22 DIAGNOSIS — R739 Hyperglycemia, unspecified: Secondary | ICD-10-CM

## 2022-12-22 DIAGNOSIS — K219 Gastro-esophageal reflux disease without esophagitis: Secondary | ICD-10-CM

## 2022-12-22 DIAGNOSIS — R7989 Other specified abnormal findings of blood chemistry: Secondary | ICD-10-CM

## 2022-12-22 DIAGNOSIS — Z86010 Personal history of colonic polyps: Secondary | ICD-10-CM

## 2022-12-22 DIAGNOSIS — G2581 Restless legs syndrome: Secondary | ICD-10-CM | POA: Diagnosis not present

## 2022-12-22 DIAGNOSIS — I10 Essential (primary) hypertension: Secondary | ICD-10-CM | POA: Diagnosis not present

## 2022-12-22 DIAGNOSIS — F419 Anxiety disorder, unspecified: Secondary | ICD-10-CM | POA: Diagnosis not present

## 2022-12-22 DIAGNOSIS — E7800 Pure hypercholesterolemia, unspecified: Secondary | ICD-10-CM | POA: Diagnosis not present

## 2022-12-22 MED ORDER — ROSUVASTATIN CALCIUM 5 MG PO TABS: 5.0000 mg | ORAL_TABLET | ORAL | 1 refills | Status: DC

## 2022-12-22 MED ORDER — LOSARTAN POTASSIUM 100 MG PO TABS: 100.0000 mg | ORAL_TABLET | Freq: Every day | ORAL | 1 refills | Status: DC

## 2022-12-22 NOTE — Progress Notes (Signed)
Subjective:    Patient ID: Chloe Moyer, female    DOB: 1951/10/23, 72 y.o.   MRN: [id number]  Patient here for  Chief Complaint  Patient presents with   Annual Exam    HPI Here for physical. Reports overall doing relatively well now.  Recently evaluated by Tomasita Morrow - anxiety and elevated blood pressure.  Recommended decreasing caffeine intake. Has been recording blood pressure.  Reviewed outside readings - most averaging 120s/70s.  No chest pain or sob reported.  Stays active.  No increased cough.  No abdominal pain or bowel change reported. Seeing endocrinology.  Last 11/28/22 - monitoring.   Past Medical History:  Diagnosis Date   Allergic rhinitis    Arthritis    Asthma    Diverticulosis, sigmoid    Family history of breast cancer    Family history of cancer of mouth    Family history of lung cancer    GERD (gastroesophageal reflux disease)    History of hiatal hernia    Hypertension 06/12/2005   Ulcer    Past Surgical History:  Procedure Laterality Date   BREAST BIOPSY Right 09/01/2017   Affirm Bx of 2 areas- both areas were fibroadenomatoid change    BREAST CYST ASPIRATION Left 1990   CARDIAC CATHETERIZATION  July 2012   CHOLECYSTECTOMY  1991   LIPOMA EXCISION  4/99   Left flank area   NASAL SINUS SURGERY  July 2012   TUBAL LIGATION  1984   Family History  Problem Relation Age of Onset   Hypertension Mother    Cancer Mother        oral dx 18   Emphysema Father        smoked   Asthma Father    Lung cancer Father        smoked; dx 60s   Hypertension Father    Cancer - Cervical Paternal Aunt    Cancer Paternal Aunt        stomach v. ovarian   Cancer Paternal Uncle        unk, possibly lung   Breast cancer Maternal Grandmother 60   Stroke Maternal Grandfather    Heart disease Paternal Grandfather        myocardial infarction   Cancer Cousin        unk types   Social History   Socioeconomic History   Marital status: Married    Spouse name:  Not on file   Number of children: 2   Years of education: Not on file   Highest education level: Not on file  Occupational History   Occupation: Works at Environmental education officer  Tobacco Use   Smoking status: Never   Smokeless tobacco: Never  Substance and Sexual Activity   Alcohol use: Yes    Alcohol/week: 0.0 standard drinks of alcohol    Comment: occ wine with dinner   Drug use: No   Sexual activity: Yes  Other Topics Concern   Not on file  Social History Narrative   Not on file   Social Determinants of Health   Financial Resource Strain: Low Risk  (09/12/2022)   Overall Financial Resource Strain (CARDIA)    Difficulty of Paying Living Expenses: Not hard at all  Food Insecurity: No Food Insecurity (09/12/2022)   Hunger Vital Sign    Worried About Running Out of Food in the Last Year: Never true    Ran Out of Food in the Last Year: Never true  Transportation Needs: No Transportation  Needs (09/16/2022)   PRAPARE - Hydrologist (Medical): No    Lack of Transportation (Non-Medical): No  Physical Activity: Not on file  Stress: No Stress Concern Present (09/12/2022)   [place]    Feeling of Stress : Not at all  Social Connections: [place] (09/12/2022)   Social Connection and Isolation Panel [NHANES]    Frequency of Communication with Friends and Family: More than three times a week    Frequency of Social Gatherings with Friends and Family: More than three times a week    Attends Religious Services: More than 4 times per year    Active Member of [employer] or Organizations: Yes    Attends Music therapist: More than 4 times per year    Marital Status: Married     Review of Systems  Constitutional:  Negative for appetite change and unexpected weight change.  HENT:  Negative for congestion, sinus pressure and sore throat.   Eyes:  Negative for pain and visual  disturbance.  Respiratory:  Negative for cough, chest tightness and shortness of breath.   Cardiovascular:  Negative for chest pain, palpitations and leg swelling.  Gastrointestinal:  Negative for abdominal pain, diarrhea, nausea and vomiting.  Genitourinary:  Negative for difficulty urinating and dysuria.  Musculoskeletal:  Negative for joint swelling and myalgias.  Skin:  Negative for color change and rash.  Neurological:  Negative for dizziness and headaches.  Hematological:  Negative for adenopathy. Does not bruise/bleed easily.  Psychiatric/Behavioral:  Negative for agitation and dysphoric mood.        Objective:     BP 130/70   Pulse 88   Temp 98.2 F (36.8 C)   Ht 5' 6"$  (1.676 m)   Wt 167 lb 12.8 oz (76.1 kg)   LMP 11/03/1996   BMI 27.08 kg/m  Wt Readings from Last 3 Encounters:  01/04/23 167 lb 12.8 oz (76.1 kg)  12/04/22 172 lb 9.6 oz (78.3 kg)  09/12/22 169 lb (76.7 kg)    Physical Exam Vitals reviewed.  Constitutional:      General: She is not in acute distress.    Appearance: Normal appearance. She is well-developed.  HENT:     Head: Normocephalic and atraumatic.     Right Ear: External ear normal.     Left Ear: External ear normal.  Eyes:     General: No scleral icterus.       Right eye: No discharge.        Left eye: No discharge.     Conjunctiva/sclera: Conjunctivae normal.  Neck:     Thyroid: No thyromegaly.  Cardiovascular:     Rate and Rhythm: Normal rate and regular rhythm.  Pulmonary:     Effort: No tachypnea, accessory muscle usage or respiratory distress.     Breath sounds: Normal breath sounds. No decreased breath sounds or wheezing.  Chest:  Breasts:    Right: No inverted nipple, mass, nipple discharge or tenderness (no axillary adenopathy).     Left: No inverted nipple, mass, nipple discharge or tenderness (no axilarry adenopathy).  Abdominal:     General: Bowel sounds are normal.     Palpations: Abdomen is soft.     Tenderness:  There is no abdominal tenderness.  Musculoskeletal:        General: No swelling or tenderness.     Cervical back: Neck supple.  Lymphadenopathy:     Cervical: No cervical adenopathy.  Skin:    Findings: No erythema or rash.  Neurological:     Mental Status: She is alert and oriented to person, place, and time.  Psychiatric:        Mood and Affect: Mood normal.        Behavior: Behavior normal.      Outpatient Encounter Medications as of 12/22/2022  Medication Sig   albuterol (PROVENTIL HFA;VENTOLIN HFA) 108 (90 BASE) MCG/ACT inhaler Inhale 2 puffs using inhaler every 4-6 hours as needed for SOB/wheeze.   budesonide (PULMICORT) 0.5 MG/2ML nebulizer solution Take 0.5 mg by nebulization 2 (two) times daily.   budesonide-formoterol (SYMBICORT) 160-4.5 MCG/ACT inhaler Inhale 2 puffs into the lungs daily. as directed.   Cholecalciferol (D3) 50 MCG (2000 UT) CHEW Chew 2,000 Units by mouth.   EPINEPHrine (EPIPEN 2-PAK) 0.3 mg/0.3 mL IJ SOAJ injection Inject 0.3 mLs (0.3 mg total) into the muscle once.   hydrocortisone (ANUSOL-HC) 25 MG suppository Place 1 suppository (25 mg total) rectally 2 (two) times daily as needed.   Hypertonic Nasal Wash (SINUS RINSE NA) As directed three times daily   ipratropium-albuterol (DUONEB) 0.5-2.5 (3) MG/3ML SOLN    losartan (COZAAR) 100 MG tablet Take 1 tablet (100 mg total) by mouth daily.   Multiple Vitamins-Minerals (AIRBORNE PO) Take 3 tablets by mouth. Chewable/gummies   omalizumab (XOLAIR) 150 MG injection 150 mg every 30 (thirty) days.    omeprazole (PRILOSEC) 10 MG capsule Take 1 capsule (10 mg total) by mouth daily.   rosuvastatin (CRESTOR) 5 MG tablet Take 1 tablet (5 mg total) by mouth 2 (two) times a week.   [DISCONTINUED] losartan (COZAAR) 100 MG tablet Take 1 tablet (100 mg total) by mouth daily.   [DISCONTINUED] rosuvastatin (CRESTOR) 5 MG tablet TAKE 1 TABLET BY MOUTH ON MONDAY AND THURSDAY   No facility-administered encounter medications on  file as of 12/22/2022.     Lab Results  Component Value Date   WBC 3.9 (L) 12/17/2022   HGB 13.1 12/17/2022   HCT 38.2 12/17/2022   PLT 198.0 12/17/2022   GLUCOSE 95 12/17/2022   CHOL 150 12/17/2022   TRIG 87.0 12/17/2022   HDL 49.20 12/17/2022   LDLCALC 84 12/17/2022   ALT 16 12/17/2022   AST 21 12/17/2022   NA 139 12/17/2022   K 4.2 12/17/2022   CL 106 12/17/2022   CREATININE 0.69 12/17/2022   BUN 12 12/17/2022   CO2 26 12/17/2022   TSH 0.01 (L) 08/20/2021   INR 0.94 01/07/2017   HGBA1C 5.8 12/17/2022   MICROALBUR 1.2 03/27/2020    MM 3D SCREEN BREAST BILATERAL  Result Date: 10/24/2022 CLINICAL DATA:  Screening. EXAM: DIGITAL SCREENING BILATERAL MAMMOGRAM WITH TOMOSYNTHESIS AND CAD TECHNIQUE: Bilateral screening digital craniocaudal and mediolateral oblique mammograms were obtained. Bilateral screening digital breast tomosynthesis was performed. The images were evaluated with computer-aided detection. COMPARISON:  Previous exam(s). ACR Breast Density Category b: There are scattered areas of fibroglandular density. FINDINGS: There are no findings suspicious for malignancy. IMPRESSION: No mammographic evidence of malignancy. A result letter of this screening mammogram will be mailed directly to the patient. RECOMMENDATION: Screening mammogram in one year. (Code:SM-B-01Y) BI-RADS CATEGORY  1: Negative. Electronically Signed   By: Abelardo Diesel M.D.   On: 10/24/2022 13:12       Assessment & Plan:  Routine general medical examination at a health care facility  Health care maintenance Assessment & Plan: Physical today 12/22/22.  Mammogram 10/24/22- Birads I.  Colonoscopy 03/28/22 - pathology - tubular  adenoma.  No f/u recommended.  (Dr Haig Prophet)   Hypercholesterolemia Assessment & Plan: On crestor.  Low cholesterol diet and exercise.  Follow lipid panel and liver function tests.    Orders: -     Lipid panel; Future -     Hepatic function panel; Future -     Basic metabolic  panel; Future  Hyperglycemia Assessment & Plan: Low carb diet and exercise.  Follow met b and a1c.  Discussed recent labs.   Lab Results  Component Value Date   HGBA1C 5.8 12/17/2022    Orders: -     Hemoglobin A1c; Future  Restless leg syndrome Assessment & Plan: Check cbc and iron studies.    Orders: -     CBC with Differential/Platelet; Future -     IBC + Ferritin; Future  Abnormal liver function tests Assessment & Plan: S/p previous liver biopsy -- MILD STEATOHEPATITIS, GRADE 1.  Followed by GI.  Follow liver panel.    Allergic rhinitis, unspecified seasonality, unspecified trigger Assessment & Plan: Followed by ENT.  Doing well.     Anxiousness Assessment & Plan: Recently evaluated as outlined.  Doing better.  Blood pressure better overall.  Follow.    Mild intermittent asthma without complication Assessment & Plan: Breathing overall stable - xolair and symbicort.    Gastroesophageal reflux disease, unspecified whether esophagitis present Assessment & Plan: No upper symptoms reported.  Omeprazole.     History of colonic polyps Assessment & Plan: Colonoscopy 03/28/22 - pathology - tubular adenoma.  No f/u recommended.  (Dr Haig Prophet)   Primary hypertension Assessment & Plan: Continue losartan.  Blood pressure as outlined.  Follow pressures.  Follow metabolic panel.    Stress Assessment & Plan: Overall appears to be handling things well.  Follow.    Other orders -     Losartan Potassium; Take 1 tablet (100 mg total) by mouth daily.  Dispense: 90 tablet; Refill: 1 -     Rosuvastatin Calcium; Take 1 tablet (5 mg total) by mouth 2 (two) times a week.  Dispense: 26 tablet; Refill: 1     Einar Pheasant, MD

## 2022-12-22 NOTE — Assessment & Plan Note (Addendum)
Physical today 12/22/22.  Mammogram 10/24/22- Birads I.  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  No f/u recommended.  (Dr Haig Prophet)

## 2023-01-04 ENCOUNTER — Encounter: Payer: Self-pay | Admitting: Internal Medicine

## 2023-01-04 NOTE — Assessment & Plan Note (Signed)
Followed by ENT.  Doing well.

## 2023-01-04 NOTE — Assessment & Plan Note (Signed)
Check cbc and iron studies.

## 2023-01-04 NOTE — Assessment & Plan Note (Signed)
S/p previous liver biopsy -- MILD STEATOHEPATITIS, GRADE 1.  Followed by GI.  Follow liver panel.

## 2023-01-04 NOTE — Assessment & Plan Note (Signed)
Recently evaluated as outlined.  Doing better.  Blood pressure better overall.  Follow.

## 2023-01-04 NOTE — Assessment & Plan Note (Signed)
No upper symptoms reported.  Omeprazole.   

## 2023-01-04 NOTE — Assessment & Plan Note (Signed)
Breathing overall stable - xolair and symbicort.  

## 2023-01-04 NOTE — Assessment & Plan Note (Signed)
Continue losartan.  Blood pressure as outlined.  Follow pressures.  Follow metabolic panel.  

## 2023-01-04 NOTE — Assessment & Plan Note (Signed)
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  No f/u recommended.  (Dr Haig Prophet)

## 2023-01-04 NOTE — Assessment & Plan Note (Signed)
Overall appears to be handling things well.  Follow.  ?

## 2023-01-04 NOTE — Assessment & Plan Note (Signed)
Low carb diet and exercise.  Follow met b and a1c.  Discussed recent labs.   Lab Results  Component Value Date   HGBA1C 5.8 12/17/2022

## 2023-01-04 NOTE — Assessment & Plan Note (Signed)
On crestor.  Low cholesterol diet and exercise.  Follow lipid panel and liver function tests.   

## 2023-02-05 ENCOUNTER — Other Ambulatory Visit: Payer: Self-pay | Admitting: Internal Medicine

## 2023-03-05 DIAGNOSIS — M8589 Other specified disorders of bone density and structure, multiple sites: Secondary | ICD-10-CM | POA: Diagnosis not present

## 2023-03-05 DIAGNOSIS — E0500 Thyrotoxicosis with diffuse goiter without thyrotoxic crisis or storm: Secondary | ICD-10-CM | POA: Diagnosis not present

## 2023-03-18 ENCOUNTER — Encounter: Payer: Self-pay | Admitting: Internal Medicine

## 2023-03-18 ENCOUNTER — Ambulatory Visit: Payer: PPO | Admitting: Nurse Practitioner

## 2023-03-18 NOTE — Telephone Encounter (Signed)
I can work in as we discussed.

## 2023-03-18 NOTE — Telephone Encounter (Signed)
Chloe Moyer and Dr Lorin Picket:  [name] with patient. She says that she needs something to help with her nerves. Under increased stress because she just found out that her son in law has been cheating on her daughter after being married for 21 years. Not coping well. Advised Dr Lorin Picket is out of office this PM. Patient did agree to see Arville Lime to discuss treatment options then will follow up with Dr Lorin Picket. Confirmed no SI. Scheduled with Kacy for in office visit today at 4 pm. Patient declined virtual appt because she feels that she will not have somewhere to talk privately at home.

## 2023-03-18 NOTE — Telephone Encounter (Signed)
Arville Lime- disregard this message. Patient has been scheduled with Dr Lorin Picket tomorrow. Thanks.

## 2023-03-19 ENCOUNTER — Ambulatory Visit (INDEPENDENT_AMBULATORY_CARE_PROVIDER_SITE_OTHER): Payer: PPO | Admitting: Internal Medicine

## 2023-03-19 VITALS — BP 130/70 | HR 99 | Temp 98.2°F | Resp 16 | Ht 65.0 in | Wt 167.0 lb

## 2023-03-19 DIAGNOSIS — F439 Reaction to severe stress, unspecified: Secondary | ICD-10-CM | POA: Diagnosis not present

## 2023-03-19 DIAGNOSIS — J4520 Mild intermittent asthma, uncomplicated: Secondary | ICD-10-CM

## 2023-03-19 MED ORDER — LORAZEPAM 0.5 MG PO TABS: ORAL_TABLET | ORAL | 0 refills | Status: DC

## 2023-03-19 NOTE — Progress Notes (Signed)
Subjective:    Patient ID: Chloe Moyer, female    DOB: 1951/07/07, 72 y.o.   MRN: [id number]  Patient here for  Chief Complaint  Patient presents with   Stress    HPI Work in appt.  Increased stress.  Discussed.  Just found out that her son-n-law is cheating on her daughter.  They have been married for 21 years.  Increased stress and anxiety.  Not sleeping.  Feels needs something to help her relax and help her calm down.     Past Medical History:  Diagnosis Date   Allergic rhinitis    Arthritis    Asthma    Diverticulosis, sigmoid    Family history of breast cancer    Family history of cancer of mouth    Family history of lung cancer    GERD (gastroesophageal reflux disease)    History of hiatal hernia    Hypertension 06/12/2005   Ulcer    Past Surgical History:  Procedure Laterality Date   BREAST BIOPSY Right 09/01/2017   Affirm Bx of 2 areas- both areas were fibroadenomatoid change    BREAST CYST ASPIRATION Left 1990   CARDIAC CATHETERIZATION  July 2012   CHOLECYSTECTOMY  1991   LIPOMA EXCISION  4/99   Left flank area   NASAL SINUS SURGERY  July 2012   TUBAL LIGATION  1984   Family History  Problem Relation Age of Onset   Hypertension Mother    Cancer Mother        oral dx 50   Emphysema Father        smoked   Asthma Father    Lung cancer Father        smoked; dx 69s   Hypertension Father    Cancer - Cervical Paternal Aunt    Cancer Paternal Aunt        stomach v. ovarian   Cancer Paternal Uncle        unk, possibly lung   Breast cancer Maternal Grandmother 60   Stroke Maternal Grandfather    Heart disease Paternal Grandfather        myocardial infarction   Cancer Cousin        unk types   Social History   Socioeconomic History   Marital status: Married    Spouse name: Not on file   Number of children: 2   Years of education: Not on file   Highest education level: Associate degree: academic program  Occupational History   Occupation:  Works at Training and development officer  Tobacco Use   Smoking status: Never   Smokeless tobacco: Never  Substance and Sexual Activity   Alcohol use: Yes    Alcohol/week: 0.0 standard drinks of alcohol    Comment: occ wine with dinner   Drug use: No   Sexual activity: Yes  Other Topics Concern   Not on file  Social History Narrative   Not on file   Social Determinants of Health   Financial Resource Strain: Low Risk  (03/18/2023)   Overall Financial Resource Strain (CARDIA)    Difficulty of Paying Living Expenses: Not hard at all  Food Insecurity: No Food Insecurity (03/18/2023)   Hunger Vital Sign    Worried About Running Out of Food in the Last Year: Never true    Ran Out of Food in the Last Year: Never true  Transportation Needs: No Transportation Needs (03/18/2023)   PRAPARE - Administrator, Civil Service (Medical): No  Lack of Transportation (Non-Medical): No  Physical Activity: Unknown (03/18/2023)   Exercise Vital Sign    Days of Exercise per Week: 2 days    Minutes of Exercise per Session: Not on file  Stress: Stress Concern Present (03/18/2023)   [employer] of Occupational Health - Occupational Stress Questionnaire    Feeling of Stress : To some extent  Social Connections: [place] (03/18/2023)   Social Connection and Isolation Panel [NHANES]    Frequency of Communication with Friends and Family: More than three times a week    Frequency of Social Gatherings with Friends and Family: Once a week    Attends Religious Services: More than 4 times per year    Active Member of [employer] or Organizations: Yes    Attends Engineer, structural: More than 4 times per year    Marital Status: Married     Review of Systems  Constitutional:  Negative for appetite change and unexpected weight change.  HENT:  Negative for congestion and sinus pressure.   Respiratory:  Negative for cough, chest tightness and shortness of breath.   Cardiovascular:  Negative for chest  pain and palpitations.  Gastrointestinal:  Negative for abdominal pain, diarrhea, nausea and vomiting.  Genitourinary:  Negative for difficulty urinating and dysuria.  Musculoskeletal:  Negative for joint swelling and myalgias.  Skin:  Negative for color change and rash.  Neurological:  Negative for dizziness and headaches.  Psychiatric/Behavioral:  Positive for sleep disturbance.        Increased stress and anxiety as outlined        Objective:     BP 130/70   Pulse 99   Temp 98.2 F (36.8 C)   Resp 16   Ht 5\' 5"  (1.651 m)   Wt 167 lb (75.8 kg)   LMP 11/03/1996   SpO2 98%   BMI 27.79 kg/m  Wt Readings from Last 3 Encounters:  03/19/23 167 lb (75.8 kg)  01/04/23 167 lb 12.8 oz (76.1 kg)  12/04/22 172 lb 9.6 oz (78.3 kg)    Physical Exam Vitals reviewed.  Constitutional:      General: She is not in acute distress.    Appearance: Normal appearance.  HENT:     Head: Normocephalic and atraumatic.     Right Ear: External ear normal.     Left Ear: External ear normal.  Eyes:     General: No scleral icterus.       Right eye: No discharge.        Left eye: No discharge.     Conjunctiva/sclera: Conjunctivae normal.  Neck:     Thyroid: No thyromegaly.  Cardiovascular:     Rate and Rhythm: Normal rate and regular rhythm.  Pulmonary:     Effort: No respiratory distress.     Breath sounds: Normal breath sounds. No wheezing.  Abdominal:     General: Bowel sounds are normal.     Palpations: Abdomen is soft.     Tenderness: There is no abdominal tenderness.  Musculoskeletal:        General: No swelling or tenderness.     Cervical back: Neck supple. No tenderness.  Lymphadenopathy:     Cervical: No cervical adenopathy.  Skin:    Findings: No erythema or rash.  Neurological:     Mental Status: She is alert.  Psychiatric:        Mood and Affect: Mood normal.        Behavior: Behavior normal.  Outpatient Encounter Medications as of 03/19/2023  Medication Sig    LORazepam (ATIVAN) 0.5 MG tablet Take 1/2 - 1 tablet bid prn   Vitamin D, Ergocalciferol, (DRISDOL) 1.25 MG (50000 UNIT) CAPS capsule Take 50,000 Units by mouth once a week.   albuterol (PROVENTIL HFA;VENTOLIN HFA) 108 (90 BASE) MCG/ACT inhaler Inhale 2 puffs using inhaler every 4-6 hours as needed for SOB/wheeze.   budesonide (PULMICORT) 0.5 MG/2ML nebulizer solution Take 0.5 mg by nebulization 2 (two) times daily.   budesonide-formoterol (SYMBICORT) 160-4.5 MCG/ACT inhaler Inhale 2 puffs into the lungs daily. as directed.   Cholecalciferol (D3) 50 MCG (2000 UT) CHEW Chew 2,000 Units by mouth.   EPINEPHrine (EPIPEN 2-PAK) 0.3 mg/0.3 mL IJ SOAJ injection Inject 0.3 mLs (0.3 mg total) into the muscle once.   hydrocortisone (ANUSOL-HC) 25 MG suppository Place 1 suppository (25 mg total) rectally 2 (two) times daily as needed.   Hypertonic Nasal Wash (SINUS RINSE NA) As directed three times daily   ipratropium-albuterol (DUONEB) 0.5-2.5 (3) MG/3ML SOLN    losartan (COZAAR) 100 MG tablet Take 1 tablet (100 mg total) by mouth daily.   Multiple Vitamins-Minerals (AIRBORNE PO) Take 3 tablets by mouth. Chewable/gummies   omalizumab (XOLAIR) 150 MG injection 150 mg every 30 (thirty) days.    omeprazole (PRILOSEC) 10 MG capsule TAKE 1 CAPSULE BY MOUTH ONCE DAILY   rosuvastatin (CRESTOR) 5 MG tablet Take 1 tablet (5 mg total) by mouth 2 (two) times a week.   No facility-administered encounter medications on file as of 03/19/2023.     Lab Results  Component Value Date   WBC 3.9 (L) 12/17/2022   HGB 13.1 12/17/2022   HCT 38.2 12/17/2022   PLT 198.0 12/17/2022   GLUCOSE 95 12/17/2022   CHOL 150 12/17/2022   TRIG 87.0 12/17/2022   HDL 49.20 12/17/2022   LDLCALC 84 12/17/2022   ALT 16 12/17/2022   AST 21 12/17/2022   NA 139 12/17/2022   K 4.2 12/17/2022   CL 106 12/17/2022   CREATININE 0.69 12/17/2022   BUN 12 12/17/2022   CO2 26 12/17/2022   TSH 0.01 (L) 08/20/2021   INR 0.94 01/07/2017    HGBA1C 5.8 12/17/2022   MICROALBUR 1.2 03/27/2020    MM 3D SCREEN BREAST BILATERAL  Result Date: 10/24/2022 CLINICAL DATA:  Screening. EXAM: DIGITAL SCREENING BILATERAL MAMMOGRAM WITH TOMOSYNTHESIS AND CAD TECHNIQUE: Bilateral screening digital craniocaudal and mediolateral oblique mammograms were obtained. Bilateral screening digital breast tomosynthesis was performed. The images were evaluated with computer-aided detection. COMPARISON:  Previous exam(s). ACR Breast Density Category b: There are scattered areas of fibroglandular density. FINDINGS: There are no findings suspicious for malignancy. IMPRESSION: No mammographic evidence of malignancy. A result letter of this screening mammogram will be mailed directly to the patient. RECOMMENDATION: Screening mammogram in one year. (Code:SM-B-01Y) BI-RADS CATEGORY  1: Negative. Electronically Signed   By: Sherian Rein M.D.   On: 10/24/2022 13:12       Assessment & Plan:  Stress Assessment & Plan: Increased stress as outlined.  Discussed.  Discussed treatment options.  Previously tried buspar - did not tolerate.  Does not feel needs something long term.  Feels needs something to have prn.  Also feels needs something to help her relax at night. Trial of lorazepam .5mg  - take 1/2 tablet prn.  Understands this is short term.  Follow closely.  Call with update.     Mild intermittent asthma without complication Assessment & Plan: Breathing overall stable - xolair and  symbicort.    Other orders -     LORazepam; Take 1/2 - 1 tablet bid prn  Dispense: 40 tablet; Refill: 0     Dale Dover, MD

## 2023-03-22 ENCOUNTER — Encounter: Payer: Self-pay | Admitting: Internal Medicine

## 2023-03-22 NOTE — Assessment & Plan Note (Signed)
Breathing overall stable - xolair and symbicort.  

## 2023-03-22 NOTE — Assessment & Plan Note (Signed)
Increased stress as outlined.  Discussed.  Discussed treatment options.  Previously tried buspar - did not tolerate.  Does not feel needs something long term.  Feels needs something to have prn.  Also feels needs something to help her relax at night. Trial of lorazepam .5mg  - take 1/2 tablet prn.  Understands this is short term.  Follow closely.  Call with update.

## 2023-03-23 NOTE — Telephone Encounter (Signed)
Called and spoke to Chloe Moyer.  Discussed other possible treatments.  Informed her that given she had issues with with buspar, ativan that I would discuss with psychiatry regarding other treatment options.  Please call and inform her that I would like to try hydroxyzine 10mg  q hs prn.  If agreeable, let me know and I will send in rx.

## 2023-03-23 NOTE — Telephone Encounter (Signed)
Called patient and says that she is open to try anything you recommend but the ativan did not work for her at all. She was very disoriented/groggy feeling and felt unsteady when up moving. She felt unsafe to function like she normally would. Pt is requesting a different medication. Confirmed with her that her symptoms have resolved after stopping the ativan.

## 2023-03-24 ENCOUNTER — Other Ambulatory Visit: Payer: Self-pay

## 2023-03-24 MED ORDER — HYDROXYZINE HCL 10 MG PO TABS: 10.0000 mg | ORAL_TABLET | Freq: Every evening | ORAL | 0 refills | Status: DC | PRN

## 2023-03-24 NOTE — Telephone Encounter (Signed)
FYI- Patient agreed and I have sent in rx for hydroxyzine 10 mg qhs prn #30 with no refills. Pt will let us know how she does with this.

## 2023-04-20 ENCOUNTER — Other Ambulatory Visit (INDEPENDENT_AMBULATORY_CARE_PROVIDER_SITE_OTHER): Payer: PPO

## 2023-04-20 DIAGNOSIS — R739 Hyperglycemia, unspecified: Secondary | ICD-10-CM | POA: Diagnosis not present

## 2023-04-20 DIAGNOSIS — G2581 Restless legs syndrome: Secondary | ICD-10-CM | POA: Diagnosis not present

## 2023-04-20 DIAGNOSIS — E7800 Pure hypercholesterolemia, unspecified: Secondary | ICD-10-CM | POA: Diagnosis not present

## 2023-04-20 LAB — LIPID PANEL
Cholesterol: 136 mg/dL (ref 0–200)
HDL: 46.6 mg/dL (ref >39.00)
LDL Cholesterol: 70 mg/dL (ref 0–99)
NonHDL: 89.49
Total CHOL/HDL Ratio: 3
Triglycerides: 97 mg/dL (ref 0.0–149.0)
VLDL: 19.4 mg/dL (ref 0.0–40.0)

## 2023-04-20 LAB — CBC WITH DIFFERENTIAL/PLATELET
Basophils Absolute: 0 10*3/uL (ref 0.0–0.1)
Basophils Relative: 0.8 % (ref 0.0–3.0)
Eosinophils Absolute: 0.2 10*3/uL (ref 0.0–0.7)
Eosinophils Relative: 4.3 % (ref 0.0–5.0)
HCT: 38.8 % (ref 36.0–46.0)
Hemoglobin: 12.7 g/dL (ref 12.0–15.0)
Lymphocytes Relative: 34.2 % (ref 12.0–46.0)
Lymphs Abs: 1.3 10*3/uL (ref 0.7–4.0)
MCHC: 32.8 g/dL (ref 30.0–36.0)
MCV: 87.7 fl (ref 78.0–100.0)
Monocytes Absolute: 0.4 10*3/uL (ref 0.1–1.0)
Monocytes Relative: 10.3 % (ref 3.0–12.0)
Neutro Abs: 2 10*3/uL (ref 1.4–7.7)
Neutrophils Relative %: 50.4 % (ref 43.0–77.0)
Platelets: 167 10*3/uL (ref 150.0–400.0)
RBC: 4.42 Mil/uL (ref 3.87–5.11)
RDW: 14.2 % (ref 11.5–15.5)
WBC: 3.9 10*3/uL — ABNORMAL LOW (ref 4.0–10.5)

## 2023-04-20 LAB — BASIC METABOLIC PANEL
BUN: 10 mg/dL (ref 6–23)
CO2: 26 mEq/L (ref 19–32)
Calcium: 10.1 mg/dL (ref 8.4–10.5)
Chloride: 105 mEq/L (ref 96–112)
Creatinine, Ser: 0.69 mg/dL (ref 0.40–1.20)
GFR: 86.04 mL/min (ref >60.00)
Glucose, Bld: 102 mg/dL — ABNORMAL HIGH (ref 70–99)
Potassium: 4 mEq/L (ref 3.5–5.1)
Sodium: 138 mEq/L (ref 135–145)

## 2023-04-20 LAB — HEPATIC FUNCTION PANEL
ALT: 17 U/L (ref 0–35)
AST: 21 U/L (ref 0–37)
Albumin: 4.1 g/dL (ref 3.5–5.2)
Alkaline Phosphatase: 64 U/L (ref 39–117)
Bilirubin, Direct: 0.2 mg/dL (ref 0.0–0.3)
Total Bilirubin: 0.7 mg/dL (ref 0.2–1.2)
Total Protein: 7.3 g/dL (ref 6.0–8.3)

## 2023-04-20 LAB — IBC + FERRITIN
Ferritin: 32.2 ng/mL (ref 10.0–291.0)
Iron: 66 ug/dL (ref 42–145)
Saturation Ratios: 18.7 % — ABNORMAL LOW (ref 20.0–50.0)
TIBC: 352.8 ug/dL (ref 250.0–450.0)
Transferrin: 252 mg/dL (ref 212.0–360.0)

## 2023-04-20 LAB — HEMOGLOBIN A1C: Hgb A1c MFr Bld: 5.7 % (ref 4.6–6.5)

## 2023-04-23 ENCOUNTER — Ambulatory Visit (INDEPENDENT_AMBULATORY_CARE_PROVIDER_SITE_OTHER): Payer: PPO | Admitting: Internal Medicine

## 2023-04-23 ENCOUNTER — Encounter: Payer: Self-pay | Admitting: Internal Medicine

## 2023-04-23 VITALS — BP 116/70 | HR 85 | Temp 97.9°F | Resp 16 | Ht 65.0 in | Wt 170.0 lb

## 2023-04-23 DIAGNOSIS — J309 Allergic rhinitis, unspecified: Secondary | ICD-10-CM | POA: Diagnosis not present

## 2023-04-23 DIAGNOSIS — I10 Essential (primary) hypertension: Secondary | ICD-10-CM | POA: Diagnosis not present

## 2023-04-23 DIAGNOSIS — Z809 Family history of malignant neoplasm, unspecified: Secondary | ICD-10-CM | POA: Diagnosis not present

## 2023-04-23 DIAGNOSIS — D72819 Decreased white blood cell count, unspecified: Secondary | ICD-10-CM | POA: Diagnosis not present

## 2023-04-23 DIAGNOSIS — Z86010 Personal history of colonic polyps: Secondary | ICD-10-CM | POA: Diagnosis not present

## 2023-04-23 DIAGNOSIS — L989 Disorder of the skin and subcutaneous tissue, unspecified: Secondary | ICD-10-CM

## 2023-04-23 DIAGNOSIS — J4520 Mild intermittent asthma, uncomplicated: Secondary | ICD-10-CM | POA: Diagnosis not present

## 2023-04-23 DIAGNOSIS — K219 Gastro-esophageal reflux disease without esophagitis: Secondary | ICD-10-CM

## 2023-04-23 DIAGNOSIS — E7800 Pure hypercholesterolemia, unspecified: Secondary | ICD-10-CM

## 2023-04-23 DIAGNOSIS — R739 Hyperglycemia, unspecified: Secondary | ICD-10-CM

## 2023-04-23 DIAGNOSIS — F439 Reaction to severe stress, unspecified: Secondary | ICD-10-CM

## 2023-04-23 DIAGNOSIS — R7989 Other specified abnormal findings of blood chemistry: Secondary | ICD-10-CM

## 2023-04-23 MED ORDER — LOSARTAN POTASSIUM 100 MG PO TABS: 100.0000 mg | ORAL_TABLET | Freq: Every day | ORAL | 3 refills | Status: DC

## 2023-04-23 MED ORDER — OMEPRAZOLE 10 MG PO CPDR: 10.0000 mg | DELAYED_RELEASE_CAPSULE | Freq: Every day | ORAL | 3 refills | Status: DC

## 2023-04-23 MED ORDER — ROSUVASTATIN CALCIUM 5 MG PO TABS: 5.0000 mg | ORAL_TABLET | ORAL | 3 refills | Status: DC

## 2023-04-23 NOTE — Progress Notes (Signed)
Subjective:    Patient ID: Chloe Moyer, female    DOB: [birth year], 72 y.o.   MRN: [id number]  Patient here for  Chief Complaint  Patient presents with   Medical Management of Chronic Issues    HPI Here to follow up regarding increased stress and hypertension.  Recently seen for increased stress.  Hydroxyzine prescribed to have prn.  Did not tolerate lorazepam. She is dealing with stress.  Feels better.  Sleeping better.  Only had to use the hydroxyzine a few times.  Has if needed. Also had f/u with endocrinology 03/05/23.  Recommended continuing to monitor TFTs.  With osteopenia.  Recommended f/u bone density 12/2023 and vitamin D weekly.  S/p dental surgery 04/06/23 - implants.  Some constipation around this - having to take pain medication, but is off pain medication now and bowels are better.  Previous flare with hemorrhoid, but better.  Lesion - posterior neck.  Has appt with dermatology 05/07/23.  Discussed labs.    Past Medical History:  Diagnosis Date   Allergic rhinitis    Arthritis    Asthma    Diverticulosis, sigmoid    Family history of breast cancer    Family history of cancer of mouth    Family history of lung cancer    GERD (gastroesophageal reflux disease)    History of hiatal hernia    Hypertension 06/12/2005   Ulcer    Past Surgical History:  Procedure Laterality Date   BREAST BIOPSY Right 09/01/2017   Affirm Bx of 2 areas- both areas were fibroadenomatoid change    BREAST CYST ASPIRATION Left 1990   CARDIAC CATHETERIZATION  July 2012   CHOLECYSTECTOMY  1991   LIPOMA EXCISION  4/99   Left flank area   NASAL SINUS SURGERY  July 2012   TUBAL LIGATION  1984   Family History  Problem Relation Age of Onset   Hypertension Mother    Cancer Mother        oral dx 33   Emphysema Father        smoked   Asthma Father    Lung cancer Father        smoked; dx 70s   Hypertension Father    Cancer - Cervical Paternal Aunt    Cancer Paternal Aunt        stomach  v. ovarian   Cancer Paternal Uncle        unk, possibly lung   Breast cancer Maternal Grandmother 60   Stroke Maternal Grandfather    Heart disease Paternal Grandfather        myocardial infarction   Cancer Cousin        unk types   Social History   Socioeconomic History   Marital status: Married    Spouse name: Not on file   Number of children: 2   Years of education: Not on file   Highest education level: Associate degree: academic program  Occupational History   Occupation: Works at [employer]  Tobacco Use   Smoking status: Never   Smokeless tobacco: Never  Substance and Sexual Activity   Alcohol use: Yes    Alcohol/week: 0.0 standard drinks of alcohol    Comment: occ wine with dinner   Drug use: No   Sexual activity: Yes  Other Topics Concern   Not on file  Social History Narrative   Not on file   Social Determinants of Health   Financial Resource Strain: Low Risk  (03/18/2023)  Overall Financial Resource Strain (CARDIA)    Difficulty of Paying Living Expenses: Not hard at all  Food Insecurity: No Food Insecurity (03/18/2023)   Hunger Vital Sign    Worried About Running Out of Food in the Last Year: Never true    Ran Out of Food in the Last Year: Never true  Transportation Needs: No Transportation Needs (03/18/2023)   PRAPARE - Administrator, Civil Service (Medical): No    Lack of Transportation (Non-Medical): No  Physical Activity: Unknown (03/18/2023)   Exercise Vital Sign    Days of Exercise per Week: 2 days    Minutes of Exercise per Session: Not on file  Stress: Stress Concern Present (03/18/2023)   [employer] of Occupational Health - Occupational Stress Questionnaire    Feeling of Stress : To some extent  Social Connections: Socially Integrated (03/18/2023)   Social Connection and Isolation Panel [NHANES]    Frequency of Communication with Friends and Family: More than three times a week    Frequency of Social Gatherings with Friends and  Family: Once a week    Attends Religious Services: More than 4 times per year    Active Member of [employer] or Organizations: Yes    Attends Engineer, structural: More than 4 times per year    Marital Status: Married     Review of Systems  Constitutional:  Negative for appetite change and unexpected weight change.  HENT:  Negative for congestion and sinus pressure.   Respiratory:  Negative for cough, chest tightness and shortness of breath.   Cardiovascular:  Negative for chest pain, palpitations and leg swelling.  Gastrointestinal:  Negative for abdominal pain, diarrhea, nausea and vomiting.       Some recent issues with constipation.  Better now.   Genitourinary:  Negative for difficulty urinating and dysuria.  Musculoskeletal:  Negative for joint swelling and myalgias.  Skin:  Negative for color change and rash.  Neurological:  Negative for dizziness and headaches.  Psychiatric/Behavioral:  Negative for agitation and dysphoric mood.        Objective:     BP 116/70   Pulse 85   Temp 97.9 F (36.6 C)   Resp 16   Ht 5\' 5"  (1.651 m)   Wt 170 lb (77.1 kg)   LMP 11/03/1996   SpO2 98%   BMI 28.29 kg/m  Wt Readings from Last 3 Encounters:  04/23/23 170 lb (77.1 kg)  03/19/23 167 lb (75.8 kg)  01/04/23 167 lb 12.8 oz (76.1 kg)    Physical Exam Vitals reviewed.  Constitutional:      General: She is not in acute distress.    Appearance: Normal appearance.  HENT:     Head: Normocephalic and atraumatic.     Right Ear: External ear normal.     Left Ear: External ear normal.  Eyes:     General: No scleral icterus.       Right eye: No discharge.        Left eye: No discharge.     Conjunctiva/sclera: Conjunctivae normal.  Neck:     Thyroid: No thyromegaly.  Cardiovascular:     Rate and Rhythm: Normal rate and regular rhythm.  Pulmonary:     Effort: No respiratory distress.     Breath sounds: Normal breath sounds. No wheezing.  Abdominal:     General: Bowel  sounds are normal.     Palpations: Abdomen is soft.     Tenderness: There is no  abdominal tenderness.  Musculoskeletal:        General: No swelling or tenderness.     Cervical back: Neck supple. No tenderness.  Lymphadenopathy:     Cervical: No cervical adenopathy.  Skin:    Findings: No erythema or rash.  Neurological:     Mental Status: She is alert.  Psychiatric:        Mood and Affect: Mood normal.        Behavior: Behavior normal.      Outpatient Encounter Medications as of 04/23/2023  Medication Sig   albuterol (PROVENTIL HFA;VENTOLIN HFA) 108 (90 BASE) MCG/ACT inhaler Inhale 2 puffs using inhaler every 4-6 hours as needed for SOB/wheeze.   budesonide (PULMICORT) 0.5 MG/2ML nebulizer solution Take 0.5 mg by nebulization 2 (two) times daily.   budesonide-formoterol (SYMBICORT) 160-4.5 MCG/ACT inhaler Inhale 2 puffs into the lungs daily. as directed.   EPINEPHrine (EPIPEN 2-PAK) 0.3 mg/0.3 mL IJ SOAJ injection Inject 0.3 mLs (0.3 mg total) into the muscle once.   hydrocortisone (ANUSOL-HC) 25 MG suppository Place 1 suppository (25 mg total) rectally 2 (two) times daily as needed.   hydrOXYzine (ATARAX) 10 MG tablet Take 1 tablet (10 mg total) by mouth at bedtime as needed.   Hypertonic Nasal Wash (SINUS RINSE NA) As directed three times daily   ipratropium-albuterol (DUONEB) 0.5-2.5 (3) MG/3ML SOLN    omalizumab (XOLAIR) 150 MG injection 150 mg every 30 (thirty) days.    Vitamin D, Ergocalciferol, (DRISDOL) 1.25 MG (50000 UNIT) CAPS capsule Take 50,000 Units by mouth once a week.   [DISCONTINUED] losartan (COZAAR) 100 MG tablet Take 1 tablet (100 mg total) by mouth daily.   [DISCONTINUED] omeprazole (PRILOSEC) 10 MG capsule TAKE 1 CAPSULE BY MOUTH ONCE DAILY   [DISCONTINUED] rosuvastatin (CRESTOR) 5 MG tablet Take 1 tablet (5 mg total) by mouth 2 (two) times a week.   losartan (COZAAR) 100 MG tablet Take 1 tablet (100 mg total) by mouth daily.   omeprazole (PRILOSEC) 10 MG  capsule Take 1 capsule (10 mg total) by mouth daily.   rosuvastatin (CRESTOR) 5 MG tablet Take 1 tablet (5 mg total) by mouth 2 (two) times a week.   [DISCONTINUED] Cholecalciferol (D3) 50 MCG (2000 UT) CHEW Chew 2,000 Units by mouth.   [DISCONTINUED] LORazepam (ATIVAN) 0.5 MG tablet Take 1/2 - 1 tablet bid prn   [DISCONTINUED] Multiple Vitamins-Minerals (AIRBORNE PO) Take 3 tablets by mouth. Chewable/gummies   No facility-administered encounter medications on file as of 04/23/2023.     Lab Results  Component Value Date   WBC 3.9 (L) 04/20/2023   HGB 12.7 04/20/2023   HCT 38.8 04/20/2023   PLT 167.0 04/20/2023   GLUCOSE 102 (H) 04/20/2023   CHOL 136 04/20/2023   TRIG 97.0 04/20/2023   HDL 46.60 04/20/2023   LDLCALC 70 04/20/2023   ALT 17 04/20/2023   AST 21 04/20/2023   NA 138 04/20/2023   K 4.0 04/20/2023   CL 105 04/20/2023   CREATININE 0.69 04/20/2023   BUN 10 04/20/2023   CO2 26 04/20/2023   TSH 0.01 (L) 08/20/2021   INR 0.94 01/07/2017   HGBA1C 5.7 04/20/2023   MICROALBUR 1.2 03/27/2020    MM 3D SCREEN BREAST BILATERAL  Result Date: 10/24/2022 CLINICAL DATA:  Screening. EXAM: DIGITAL SCREENING BILATERAL MAMMOGRAM WITH TOMOSYNTHESIS AND CAD TECHNIQUE: Bilateral screening digital craniocaudal and mediolateral oblique mammograms were obtained. Bilateral screening digital breast tomosynthesis was performed. The images were evaluated with computer-aided detection. COMPARISON:  Previous exam(s). ACR  Breast Density Category b: There are scattered areas of fibroglandular density. FINDINGS: There are no findings suspicious for malignancy. IMPRESSION: No mammographic evidence of malignancy. A result letter of this screening mammogram will be mailed directly to the patient. RECOMMENDATION: Screening mammogram in one year. (Code:SM-B-01Y) BI-RADS CATEGORY  1: Negative. Electronically Signed   By: Sherian Rein M.D.   On: 10/24/2022 13:12       Assessment & Plan:   Hypercholesterolemia Assessment & Plan: On crestor.  Low cholesterol diet and exercise.  Follow lipid panel and liver function tests.    Orders: -     Lipid panel; Future -     Hepatic function panel; Future -     Basic metabolic panel; Future  Hyperglycemia Assessment & Plan: Low carb diet and exercise.  Follow met b and a1c.  Discussed recent labs.   Lab Results  Component Value Date   HGBA1C 5.7 04/20/2023    Orders: -     Hemoglobin A1c; Future  Abnormal liver function tests Assessment & Plan: S/p previous liver biopsy -- MILD STEATOHEPATITIS, GRADE 1.  Followed by GI.  Follow liver panel.    Allergic rhinitis, unspecified seasonality, unspecified trigger Assessment & Plan: Followed by ENT.  Doing well on current regimen.    Mild intermittent asthma without complication Assessment & Plan: Breathing overall stable - xolair and symbicort.    Family history of cancer Assessment & Plan: Multiple relatives as outlined.  Had negative genetic testing.    Gastroesophageal reflux disease, unspecified whether esophagitis present Assessment & Plan: No upper symptoms reported.  Omeprazole.     History of colonic polyps Assessment & Plan: Colonoscopy 03/28/22 - pathology - tubular adenoma.  No f/u recommended.  (Dr Mia Creek)   Primary hypertension Assessment & Plan: Continue losartan.  Blood pressure as outlined.  Follow pressures.  Follow metabolic panel.    Leukopenia, unspecified type Assessment & Plan: Follow cbc.    Stress Assessment & Plan: Recently seen for increased stress.  Hydroxyzine prescribed to have prn.  Did not tolerate lorazepam. She is dealing with stress.  Feels better.  Sleeping better.  Only had to use the hydroxyzine a few times.  Has if needed.    Lesion of neck Assessment & Plan: Has appt with dermatology this month.    Other orders -     Losartan Potassium; Take 1 tablet (100 mg total) by mouth daily.  Dispense: 90 tablet;  Refill: 3 -     Omeprazole; Take 1 capsule (10 mg total) by mouth daily.  Dispense: 90 capsule; Refill: 3 -     Rosuvastatin Calcium; Take 1 tablet (5 mg total) by mouth 2 (two) times a week.  Dispense: 26 tablet; Refill: 3     Dale Olympia Fields, MD

## 2023-04-26 ENCOUNTER — Encounter: Payer: Self-pay | Admitting: Internal Medicine

## 2023-04-26 DIAGNOSIS — L989 Disorder of the skin and subcutaneous tissue, unspecified: Secondary | ICD-10-CM | POA: Insufficient documentation

## 2023-04-26 NOTE — Assessment & Plan Note (Signed)
Follow cbc.  

## 2023-04-26 NOTE — Assessment & Plan Note (Signed)
Followed by ENT.  Doing well on current regimen.

## 2023-04-26 NOTE — Assessment & Plan Note (Signed)
No upper symptoms reported.  Omeprazole.   

## 2023-04-26 NOTE — Assessment & Plan Note (Signed)
Breathing overall stable - xolair and symbicort.  

## 2023-04-26 NOTE — Assessment & Plan Note (Signed)
On crestor.  Low cholesterol diet and exercise.  Follow lipid panel and liver function tests.   

## 2023-04-26 NOTE — Assessment & Plan Note (Signed)
Low carb diet and exercise.  Follow met b and a1c.  Discussed recent labs.   Lab Results  Component Value Date   HGBA1C 5.7 04/20/2023

## 2023-04-26 NOTE — Assessment & Plan Note (Signed)
Recently seen for increased stress.  Hydroxyzine prescribed to have prn.  Did not tolerate lorazepam. She is dealing with stress.  Feels better.  Sleeping better.  Only had to use the hydroxyzine a few times.  Has if needed.

## 2023-04-26 NOTE — Assessment & Plan Note (Signed)
Multiple relatives as outlined.  Had negative genetic testing.  

## 2023-04-26 NOTE — Assessment & Plan Note (Signed)
Has appt with dermatology this month.

## 2023-04-26 NOTE — Assessment & Plan Note (Signed)
S/p previous liver biopsy -- MILD STEATOHEPATITIS, GRADE 1.  Followed by GI.  Follow liver panel.  

## 2023-04-26 NOTE — Assessment & Plan Note (Signed)
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  No f/u recommended.  (Dr Locklear) 

## 2023-04-26 NOTE — Assessment & Plan Note (Signed)
Continue losartan.  Blood pressure as outlined.  Follow pressures.  Follow metabolic panel.  

## 2023-05-07 DIAGNOSIS — L578 Other skin changes due to chronic exposure to nonionizing radiation: Secondary | ICD-10-CM | POA: Diagnosis not present

## 2023-05-07 DIAGNOSIS — L570 Actinic keratosis: Secondary | ICD-10-CM | POA: Diagnosis not present

## 2023-05-07 DIAGNOSIS — D485 Neoplasm of uncertain behavior of skin: Secondary | ICD-10-CM | POA: Diagnosis not present

## 2023-05-07 DIAGNOSIS — K130 Diseases of lips: Secondary | ICD-10-CM | POA: Diagnosis not present

## 2023-05-07 DIAGNOSIS — Z872 Personal history of diseases of the skin and subcutaneous tissue: Secondary | ICD-10-CM | POA: Diagnosis not present

## 2023-06-02 DIAGNOSIS — L570 Actinic keratosis: Secondary | ICD-10-CM | POA: Diagnosis not present

## 2023-06-08 ENCOUNTER — Encounter: Payer: Self-pay | Admitting: Internal Medicine

## 2023-06-09 MED ORDER — HYDROCORTISONE ACETATE 25 MG RE SUPP: 25.0000 mg | Freq: Two times a day (BID) | RECTAL | 0 refills | Status: DC | PRN

## 2023-06-09 NOTE — Telephone Encounter (Signed)
Rx sent in for suppositories.  Let me know if problems.

## 2023-06-09 NOTE — Telephone Encounter (Signed)
Patient that she has history of hemorrhoids and uses these suppositories PRN. She says that she had had them for 45 years or more. Was riding on the gator and it has aggravated her hemorrhoids. Her rx is old from 2019. Ok to send in new rx for her to use? Offered patient a visit but patient said was not necessary.

## 2023-07-09 DIAGNOSIS — E0500 Thyrotoxicosis with diffuse goiter without thyrotoxic crisis or storm: Secondary | ICD-10-CM | POA: Diagnosis not present

## 2023-07-09 DIAGNOSIS — M8589 Other specified disorders of bone density and structure, multiple sites: Secondary | ICD-10-CM | POA: Diagnosis not present

## 2023-07-22 DIAGNOSIS — H04123 Dry eye syndrome of bilateral lacrimal glands: Secondary | ICD-10-CM | POA: Diagnosis not present

## 2023-07-22 DIAGNOSIS — H2513 Age-related nuclear cataract, bilateral: Secondary | ICD-10-CM | POA: Diagnosis not present

## 2023-07-22 DIAGNOSIS — H43813 Vitreous degeneration, bilateral: Secondary | ICD-10-CM | POA: Diagnosis not present

## 2023-07-22 DIAGNOSIS — E0590 Thyrotoxicosis, unspecified without thyrotoxic crisis or storm: Secondary | ICD-10-CM | POA: Diagnosis not present

## 2023-07-27 DIAGNOSIS — L570 Actinic keratosis: Secondary | ICD-10-CM | POA: Diagnosis not present

## 2023-07-28 DIAGNOSIS — Z23 Encounter for immunization: Secondary | ICD-10-CM | POA: Diagnosis not present

## 2023-07-28 DIAGNOSIS — J309 Allergic rhinitis, unspecified: Secondary | ICD-10-CM | POA: Diagnosis not present

## 2023-07-28 DIAGNOSIS — J4540 Moderate persistent asthma, uncomplicated: Secondary | ICD-10-CM | POA: Diagnosis not present

## 2023-07-28 DIAGNOSIS — R0982 Postnasal drip: Secondary | ICD-10-CM | POA: Diagnosis not present

## 2023-08-19 ENCOUNTER — Other Ambulatory Visit (INDEPENDENT_AMBULATORY_CARE_PROVIDER_SITE_OTHER): Payer: PPO

## 2023-08-19 DIAGNOSIS — R739 Hyperglycemia, unspecified: Secondary | ICD-10-CM

## 2023-08-19 DIAGNOSIS — E7800 Pure hypercholesterolemia, unspecified: Secondary | ICD-10-CM | POA: Diagnosis not present

## 2023-08-19 LAB — HEMOGLOBIN A1C: Hgb A1c MFr Bld: 5.7 % (ref 4.6–6.5)

## 2023-08-19 LAB — HEPATIC FUNCTION PANEL
ALT: 78 U/L — ABNORMAL HIGH (ref 0–35)
AST: 95 U/L — ABNORMAL HIGH (ref 0–37)
Albumin: 3.9 g/dL (ref 3.5–5.2)
Alkaline Phosphatase: 71 U/L (ref 39–117)
Bilirubin, Direct: 0.1 mg/dL (ref 0.0–0.3)
Total Bilirubin: 0.6 mg/dL (ref 0.2–1.2)
Total Protein: 7.1 g/dL (ref 6.0–8.3)

## 2023-08-19 LAB — LIPID PANEL
Cholesterol: 128 mg/dL (ref 0–200)
HDL: 34.1 mg/dL — ABNORMAL LOW (ref >39.00)
LDL Cholesterol: 73 mg/dL (ref 0–99)
NonHDL: 94.02
Total CHOL/HDL Ratio: 4
Triglycerides: 104 mg/dL (ref 0.0–149.0)
VLDL: 20.8 mg/dL (ref 0.0–40.0)

## 2023-08-19 LAB — BASIC METABOLIC PANEL
BUN: 9 mg/dL (ref 6–23)
CO2: 26 meq/L (ref 19–32)
Calcium: 9.6 mg/dL (ref 8.4–10.5)
Chloride: 106 meq/L (ref 96–112)
Creatinine, Ser: 0.66 mg/dL (ref 0.40–1.20)
GFR: 86.76 mL/min (ref >60.00)
Glucose, Bld: 92 mg/dL (ref 70–99)
Potassium: 3.9 meq/L (ref 3.5–5.1)
Sodium: 139 meq/L (ref 135–145)

## 2023-08-24 ENCOUNTER — Encounter: Payer: Self-pay | Admitting: Internal Medicine

## 2023-08-24 ENCOUNTER — Ambulatory Visit: Payer: PPO | Admitting: Internal Medicine

## 2023-08-24 VITALS — BP 130/80 | HR 81 | Temp 97.7°F | Resp 17 | Ht 65.0 in | Wt 167.5 lb

## 2023-08-24 DIAGNOSIS — Z860100 Personal history of colon polyps, unspecified: Secondary | ICD-10-CM | POA: Diagnosis not present

## 2023-08-24 DIAGNOSIS — Z23 Encounter for immunization: Secondary | ICD-10-CM | POA: Diagnosis not present

## 2023-08-24 DIAGNOSIS — R748 Abnormal levels of other serum enzymes: Secondary | ICD-10-CM

## 2023-08-24 DIAGNOSIS — E0590 Thyrotoxicosis, unspecified without thyrotoxic crisis or storm: Secondary | ICD-10-CM

## 2023-08-24 DIAGNOSIS — M25512 Pain in left shoulder: Secondary | ICD-10-CM | POA: Diagnosis not present

## 2023-08-24 DIAGNOSIS — I10 Essential (primary) hypertension: Secondary | ICD-10-CM | POA: Diagnosis not present

## 2023-08-24 DIAGNOSIS — R739 Hyperglycemia, unspecified: Secondary | ICD-10-CM

## 2023-08-24 DIAGNOSIS — R7989 Other specified abnormal findings of blood chemistry: Secondary | ICD-10-CM

## 2023-08-24 DIAGNOSIS — E7800 Pure hypercholesterolemia, unspecified: Secondary | ICD-10-CM | POA: Diagnosis not present

## 2023-08-24 DIAGNOSIS — K219 Gastro-esophageal reflux disease without esophagitis: Secondary | ICD-10-CM | POA: Diagnosis not present

## 2023-08-24 DIAGNOSIS — Z1231 Encounter for screening mammogram for malignant neoplasm of breast: Secondary | ICD-10-CM | POA: Diagnosis not present

## 2023-08-24 DIAGNOSIS — F439 Reaction to severe stress, unspecified: Secondary | ICD-10-CM | POA: Diagnosis not present

## 2023-08-24 LAB — HEPATIC FUNCTION PANEL
ALT: 115 U/L — ABNORMAL HIGH (ref 0–35)
AST: 140 U/L — ABNORMAL HIGH (ref 0–37)
Albumin: 4.2 g/dL (ref 3.5–5.2)
Alkaline Phosphatase: 74 U/L (ref 39–117)
Bilirubin, Direct: 0.2 mg/dL (ref 0.0–0.3)
Total Bilirubin: 0.7 mg/dL (ref 0.2–1.2)
Total Protein: 7.4 g/dL (ref 6.0–8.3)

## 2023-08-24 NOTE — Progress Notes (Signed)
Subjective:    Patient ID: Chloe Moyer, female    DOB: 1951/07/29, 72 y.o.   MRN: [id number]  Patient here for  Chief Complaint  Patient presents with   Medical Management of Chronic Issues    4 mth f/u    HPI Here to follow up regarding increased stress and hypertension. Had f/u with endocrinology 07/09/23. Recommended continuing to monitor TFTs. With osteopenia. Started on tapazole approximately 4 weeks ago. Reports that since starting, she feels she is not sleeping as well - on the days she takes the medication. Recommended f/u bone density 12/2023 and vitamin D weekly. Taking vitamin D.  Breathing stable.  No increased cough or congestion. No chest pain.  No abdominal pain or bowel change.  She is having left shoulder pain.  Notices more with abduction - especially at or above 90 degrees.  Has been present for months.  Request referral to ortho for evaluation and possible injection.    Past Medical History:  Diagnosis Date   Allergic rhinitis    Arthritis    Asthma    Diverticulosis, sigmoid    Family history of breast cancer    Family history of cancer of mouth    Family history of lung cancer    GERD (gastroesophageal reflux disease)    History of hiatal hernia    Hypertension 06/12/2005   Ulcer    Past Surgical History:  Procedure Laterality Date   BREAST BIOPSY Right 09/01/2017   Affirm Bx of 2 areas- both areas were fibroadenomatoid change    BREAST CYST ASPIRATION Left 1990   CARDIAC CATHETERIZATION  July 2012   CHOLECYSTECTOMY  1991   LIPOMA EXCISION  4/99   Left flank area   NASAL SINUS SURGERY  July 2012   TUBAL LIGATION  1984   Family History  Problem Relation Age of Onset   Hypertension Mother    Cancer Mother        oral dx 48   Emphysema Father        smoked   Asthma Father    Lung cancer Father        smoked; dx 36s   Hypertension Father    Cancer - Cervical Paternal Aunt    Cancer Paternal Aunt        stomach v. ovarian   Cancer Paternal  Uncle        unk, possibly lung   Breast cancer Maternal Grandmother 60   Stroke Maternal Grandfather    Heart disease Paternal Grandfather        myocardial infarction   Cancer Cousin        unk types   Social History   Socioeconomic History   Marital status: Married    Spouse name: Not on file   Number of children: 2   Years of education: Not on file   Highest education level: Associate degree: academic program  Occupational History   Occupation: Works at [employer]  Tobacco Use   Smoking status: Never   Smokeless tobacco: Never  Substance and Sexual Activity   Alcohol use: Yes    Alcohol/week: 0.0 standard drinks of alcohol    Comment: occ wine with dinner   Drug use: No   Sexual activity: Yes  Other Topics Concern   Not on file  Social History Narrative   Not on file   Social Determinants of Health   Financial Resource Strain: Low Risk  (03/18/2023)   Overall Financial Resource Strain (CARDIA)  Difficulty of Paying Living Expenses: Not hard at all  Food Insecurity: No Food Insecurity (03/18/2023)   Hunger Vital Sign    Worried About Running Out of Food in the Last Year: Never true    Ran Out of Food in the Last Year: Never true  Transportation Needs: No Transportation Needs (03/18/2023)   PRAPARE - Administrator, Civil Service (Medical): No    Lack of Transportation (Non-Medical): No  Physical Activity: Unknown (03/18/2023)   Exercise Vital Sign    Days of Exercise per Week: 2 days    Minutes of Exercise per Session: Not on file  Stress: Stress Concern Present (03/18/2023)   [employer] of Occupational Health - Occupational Stress Questionnaire    Feeling of Stress : To some extent  Social Connections: Socially Integrated (03/18/2023)   Social Connection and Isolation Panel [NHANES]    Frequency of Communication with Friends and Family: More than three times a week    Frequency of Social Gatherings with Friends and Family: Once a week     Attends Religious Services: More than 4 times per year    Active Member of [employer] or Organizations: Yes    Attends Engineer, structural: More than 4 times per year    Marital Status: Married     Review of Systems  Constitutional:  Negative for appetite change and unexpected weight change.  HENT:  Negative for congestion and sinus pressure.   Respiratory:  Negative for cough, chest tightness and shortness of breath.   Cardiovascular:  Negative for chest pain, palpitations and leg swelling.  Gastrointestinal:  Negative for abdominal pain, diarrhea, nausea and vomiting.  Genitourinary:  Negative for difficulty urinating and dysuria.  Musculoskeletal:  Negative for myalgias.       Left shoulder pain as outlined.   Skin:  Negative for color change and rash.  Neurological:  Negative for dizziness and headaches.  Psychiatric/Behavioral:  Negative for agitation and dysphoric mood.        Objective:     BP 130/80   Pulse 81   Temp 97.7 F (36.5 C) (Oral)   Resp 17   Ht 5\' 5"  (1.651 m)   Wt 167 lb 8 oz (76 kg)   LMP 11/03/1996   SpO2 96%   BMI 27.87 kg/m  Wt Readings from Last 3 Encounters:  08/24/23 167 lb 8 oz (76 kg)  04/23/23 170 lb (77.1 kg)  03/19/23 167 lb (75.8 kg)    Physical Exam Vitals reviewed.  Constitutional:      General: She is not in acute distress.    Appearance: Normal appearance.  HENT:     Head: Normocephalic and atraumatic.     Right Ear: External ear normal.     Left Ear: External ear normal.  Eyes:     General: No scleral icterus.       Right eye: No discharge.        Left eye: No discharge.     Conjunctiva/sclera: Conjunctivae normal.  Neck:     Thyroid: No thyromegaly.  Cardiovascular:     Rate and Rhythm: Normal rate and regular rhythm.  Pulmonary:     Effort: No respiratory distress.     Breath sounds: Normal breath sounds. No wheezing.  Abdominal:     General: Bowel sounds are normal.     Palpations: Abdomen is soft.      Tenderness: There is no abdominal tenderness.  Musculoskeletal:  General: No swelling.     Cervical back: Neck supple. No tenderness.     Comments: Increased pain - left shoulder - noted with abduction - especially at or above 90 degrees.   Lymphadenopathy:     Cervical: No cervical adenopathy.  Skin:    Findings: No erythema or rash.  Neurological:     Mental Status: She is alert.  Psychiatric:        Mood and Affect: Mood normal.        Behavior: Behavior normal.      Outpatient Encounter Medications as of 08/24/2023  Medication Sig   albuterol (PROVENTIL HFA;VENTOLIN HFA) 108 (90 BASE) MCG/ACT inhaler Inhale 2 puffs using inhaler every 4-6 hours as needed for SOB/wheeze.   budesonide (PULMICORT) 0.5 MG/2ML nebulizer solution Take 0.5 mg by nebulization 2 (two) times daily.   budesonide-formoterol (SYMBICORT) 160-4.5 MCG/ACT inhaler Inhale 2 puffs into the lungs daily. as directed.   EPINEPHrine (EPIPEN 2-PAK) 0.3 mg/0.3 mL IJ SOAJ injection Inject 0.3 mLs (0.3 mg total) into the muscle once.   hydrOXYzine (ATARAX) 10 MG tablet Take 1 tablet (10 mg total) by mouth at bedtime as needed.   Hypertonic Nasal Wash (SINUS RINSE NA) As directed three times daily   ipratropium-albuterol (DUONEB) 0.5-2.5 (3) MG/3ML SOLN    losartan (COZAAR) 100 MG tablet Take 1 tablet (100 mg total) by mouth daily.   methimazole (TAPAZOLE) 5 MG tablet Take 5 mg by mouth daily.   omalizumab (XOLAIR) 150 MG injection 150 mg every 30 (thirty) days.    omeprazole (PRILOSEC) 10 MG capsule Take 1 capsule (10 mg total) by mouth daily.   rosuvastatin (CRESTOR) 5 MG tablet Take 1 tablet (5 mg total) by mouth 2 (two) times a week.   Vitamin D, Ergocalciferol, (DRISDOL) 1.25 MG (50000 UNIT) CAPS capsule Take 50,000 Units by mouth once a week.   [DISCONTINUED] hydrocortisone (ANUSOL-HC) 25 MG suppository Place 1 suppository (25 mg total) rectally 2 (two) times daily as needed.   No facility-administered  encounter medications on file as of 08/24/2023.     Lab Results  Component Value Date   WBC 3.9 (L) 04/20/2023   HGB 12.7 04/20/2023   HCT 38.8 04/20/2023   PLT 167.0 04/20/2023   GLUCOSE 92 08/19/2023   CHOL 128 08/19/2023   TRIG 104.0 08/19/2023   HDL 34.10 (L) 08/19/2023   LDLCALC 73 08/19/2023   ALT 115 (H) 08/24/2023   AST 140 (H) 08/24/2023   NA 139 08/19/2023   K 3.9 08/19/2023   CL 106 08/19/2023   CREATININE 0.66 08/19/2023   BUN 9 08/19/2023   CO2 26 08/19/2023   TSH 0.01 (L) 08/20/2021   INR 0.94 01/07/2017   HGBA1C 5.7 08/19/2023   MICROALBUR 1.2 03/27/2020    MM 3D SCREEN BREAST BILATERAL  Result Date: 10/24/2022 CLINICAL DATA:  Screening. EXAM: DIGITAL SCREENING BILATERAL MAMMOGRAM WITH TOMOSYNTHESIS AND CAD TECHNIQUE: Bilateral screening digital craniocaudal and mediolateral oblique mammograms were obtained. Bilateral screening digital breast tomosynthesis was performed. The images were evaluated with computer-aided detection. COMPARISON:  Previous exam(s). ACR Breast Density Category b: There are scattered areas of fibroglandular density. FINDINGS: There are no findings suspicious for malignancy. IMPRESSION: No mammographic evidence of malignancy. A result letter of this screening mammogram will be mailed directly to the patient. RECOMMENDATION: Screening mammogram in one year. (Code:SM-B-01Y) BI-RADS CATEGORY  1: Negative. Electronically Signed   By: Sherian Rein M.D.   On: 10/24/2022 13:12  Assessment & Plan:  Primary hypertension Assessment & Plan: Continue losartan.  Blood pressure as outlined.  Follow pressures.  Follow metabolic panel.    Hypercholesterolemia Assessment & Plan: On crestor.  Low cholesterol diet and exercise.  Follow lipid panel and liver function tests.     Hyperglycemia Assessment & Plan: Low carb diet and exercise.  Follow met b and a1c.  Discussed recent labs.   Lab Results  Component Value Date   HGBA1C 5.7 08/19/2023      Encounter for screening mammogram for malignant neoplasm of breast -     3D Screening Mammogram, Left and Right; Future  Elevated liver enzymes -     Hepatic function panel  Need for influenza vaccination -     Flu Vaccine Trivalent High Dose (Fluad)  Encounter for immunization -     Flu Vaccine Trivalent High Dose (Fluad)  Stress Assessment & Plan: Hydroxyzine prescribed to have prn.  Did not tolerate lorazepam. She is dealing with stress.  Feels better.  Sleeping better.  Overall doing better. Follow.     History of colonic polyps Assessment & Plan: Colonoscopy 03/28/22 - pathology - tubular adenoma.  No f/u recommended.  (Dr Mia Creek)   Gastroesophageal reflux disease, unspecified whether esophagitis present Assessment & Plan: No upper symptoms reported.  Omeprazole.     Hyperthyroidism Assessment & Plan:  Had f/u with endocrinology 07/09/23. Recommended continuing to monitor TFTs. With osteopenia. Started on tapazole approximately 4 weeks ago.  Discussed recent labs.  Liver function elevated.  This is the only new medication.  Discussed possible liver enzyme increase with tapazole, but she is only taking 2 days per week.  Will recheck liver panel today.  Follow.  Continue f/u with endocrinology.    Abnormal liver function tests Assessment & Plan: S/p previous liver biopsy -- MILD STEATOHEPATITIS, GRADE 1.  Followed by GI.   Had f/u with endocrinology 07/09/23. Recommended continuing to monitor TFTs. With osteopenia. Started on tapazole approximately 4 weeks ago.  Discussed recent labs.  Liver function elevated.  This is the only new medication.  Discussed possible liver enzyme increase with tapazole, but she is only taking 2 days per week.  Will recheck liver panel today.  Follow.  Continue f/u with endocrinology.    Left shoulder pain, unspecified chronicity Assessment & Plan: She is having left shoulder pain.  Notices more with abduction - especially at or above 90  degrees.  Has been present for months.  Request referral to ortho for evaluation and possible injection.   Orders: -     Ambulatory referral to Orthopedic Surgery     Dale Niangua, MD

## 2023-08-28 DIAGNOSIS — K7581 Nonalcoholic steatohepatitis (NASH): Secondary | ICD-10-CM | POA: Diagnosis not present

## 2023-08-28 DIAGNOSIS — R7989 Other specified abnormal findings of blood chemistry: Secondary | ICD-10-CM | POA: Diagnosis not present

## 2023-08-30 ENCOUNTER — Telehealth: Payer: Self-pay | Admitting: Internal Medicine

## 2023-08-30 ENCOUNTER — Encounter: Payer: Self-pay | Admitting: Internal Medicine

## 2023-08-30 DIAGNOSIS — E0590 Thyrotoxicosis, unspecified without thyrotoxic crisis or storm: Secondary | ICD-10-CM | POA: Insufficient documentation

## 2023-08-30 DIAGNOSIS — M25512 Pain in left shoulder: Secondary | ICD-10-CM | POA: Insufficient documentation

## 2023-08-30 NOTE — Assessment & Plan Note (Signed)
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  No f/u recommended.  (Dr Mia Creek)

## 2023-08-30 NOTE — Telephone Encounter (Signed)
Need to confirm if flu vaccine was given when she was here for office visit.  Also, she saw GI for elevated liver function tests.  Is off tapazole now.  Has f/u with her endocrinologist in 09/2023.  [facility] her to liver specialist.  I would like to recheck her liver panel within the next week to see if coming down now that she is off tapazole.

## 2023-08-30 NOTE — Assessment & Plan Note (Signed)
S/p previous liver biopsy -- MILD STEATOHEPATITIS, GRADE 1.  Followed by GI.   Had f/u with endocrinology 07/09/23. Recommended continuing to monitor TFTs. With osteopenia. Started on tapazole approximately 4 weeks ago.  Discussed recent labs.  Liver function elevated.  This is the only new medication.  Discussed possible liver enzyme increase with tapazole, but she is only taking 2 days per week.  Will recheck liver panel today.  Follow.  Continue f/u with endocrinology.

## 2023-08-30 NOTE — Assessment & Plan Note (Signed)
On crestor.  Low cholesterol diet and exercise.  Follow lipid panel and liver function tests.

## 2023-08-30 NOTE — Assessment & Plan Note (Signed)
She is having left shoulder pain.  Notices more with abduction - especially at or above 90 degrees.  Has been present for months.  Request referral to ortho for evaluation and possible injection.

## 2023-08-30 NOTE — Assessment & Plan Note (Signed)
No upper symptoms reported.  Omeprazole.

## 2023-08-30 NOTE — Assessment & Plan Note (Signed)
Had f/u with endocrinology 07/09/23. Recommended continuing to monitor TFTs. With osteopenia. Started on tapazole approximately 4 weeks ago.  Discussed recent labs.  Liver function elevated.  This is the only new medication.  Discussed possible liver enzyme increase with tapazole, but she is only taking 2 days per week.  Will recheck liver panel today.  Follow.  Continue f/u with endocrinology.

## 2023-08-30 NOTE — Assessment & Plan Note (Signed)
Continue losartan.  Blood pressure as outlined.  Follow pressures.  Follow metabolic panel.

## 2023-08-30 NOTE — Assessment & Plan Note (Signed)
Hydroxyzine prescribed to have prn.  Did not tolerate lorazepam. She is dealing with stress.  Feels better.  Sleeping better.  Overall doing better. Follow.

## 2023-08-30 NOTE — Assessment & Plan Note (Signed)
Low carb diet and exercise.  Follow met b and a1c.  Discussed recent labs.   Lab Results  Component Value Date   HGBA1C 5.7 08/19/2023

## 2023-08-31 ENCOUNTER — Other Ambulatory Visit: Payer: Self-pay | Admitting: Nurse Practitioner

## 2023-08-31 DIAGNOSIS — K7581 Nonalcoholic steatohepatitis (NASH): Secondary | ICD-10-CM

## 2023-08-31 DIAGNOSIS — R7989 Other specified abnormal findings of blood chemistry: Secondary | ICD-10-CM

## 2023-08-31 NOTE — Telephone Encounter (Signed)
FYI for you- flu shot was given at appt. She is going in for lab work for [employer]. Kim called her over the weekend per patient.Marland Kitchen

## 2023-08-31 NOTE — Telephone Encounter (Signed)
Ok.  Thanks.  Will wait for labs with GI

## 2023-09-01 ENCOUNTER — Ambulatory Visit
Admission: RE | Admit: 2023-09-01 | Discharge: 2023-09-01 | Disposition: A | Discharge status: Home / Self Care | Payer: PPO | Source: Ambulatory Visit | Attending: Nurse Practitioner | Admitting: Nurse Practitioner

## 2023-09-01 DIAGNOSIS — K7581 Nonalcoholic steatohepatitis (NASH): Secondary | ICD-10-CM | POA: Diagnosis not present

## 2023-09-01 DIAGNOSIS — R160 Hepatomegaly, not elsewhere classified: Secondary | ICD-10-CM | POA: Diagnosis not present

## 2023-09-01 DIAGNOSIS — R7989 Other specified abnormal findings of blood chemistry: Secondary | ICD-10-CM | POA: Diagnosis not present

## 2023-09-01 DIAGNOSIS — Z9049 Acquired absence of other specified parts of digestive tract: Secondary | ICD-10-CM | POA: Diagnosis not present

## 2023-09-08 ENCOUNTER — Other Ambulatory Visit: Payer: Self-pay | Admitting: Nurse Practitioner

## 2023-09-08 DIAGNOSIS — K7581 Nonalcoholic steatohepatitis (NASH): Secondary | ICD-10-CM

## 2023-09-08 DIAGNOSIS — R7989 Other specified abnormal findings of blood chemistry: Secondary | ICD-10-CM

## 2023-09-25 ENCOUNTER — Ambulatory Visit: Payer: PPO

## 2023-09-29 ENCOUNTER — Encounter: Payer: Self-pay | Admitting: Internal Medicine

## 2023-09-29 ENCOUNTER — Other Ambulatory Visit: Payer: Self-pay | Admitting: Internal Medicine

## 2023-09-29 ENCOUNTER — Other Ambulatory Visit: Payer: Self-pay

## 2023-09-29 MED ORDER — HYDROXYZINE HCL 10 MG PO TABS: 10.0000 mg | ORAL_TABLET | Freq: Every evening | ORAL | 0 refills | Status: DC | PRN

## 2023-09-29 NOTE — Telephone Encounter (Signed)
Patients husband scheduled. Refill sent in

## 2023-10-26 ENCOUNTER — Ambulatory Visit
Admission: RE | Admit: 2023-10-26 | Discharge: 2023-10-26 | Disposition: A | Discharge status: Home / Self Care | Payer: PPO | Source: Ambulatory Visit | Attending: Internal Medicine | Admitting: Internal Medicine

## 2023-10-26 DIAGNOSIS — Z1231 Encounter for screening mammogram for malignant neoplasm of breast: Secondary | ICD-10-CM | POA: Diagnosis not present

## 2023-11-17 ENCOUNTER — Other Ambulatory Visit: Payer: Self-pay | Admitting: Radiology

## 2023-11-17 DIAGNOSIS — K7581 Nonalcoholic steatohepatitis (NASH): Secondary | ICD-10-CM

## 2023-11-18 HISTORY — PX: LIVER BIOPSY: SHX301

## 2023-11-20 ENCOUNTER — Other Ambulatory Visit (HOSPITAL_COMMUNITY): Payer: Self-pay | Admitting: Student

## 2023-11-20 NOTE — Progress Notes (Signed)
 Patient for US  guided Liver Biopsy on Mon 11/23/2023, I called and spoke with the patient on the phone and gave pre-procedure instructions. Pt was made aware to be here at 7:30a, NPO after MN prior to procedure as well as driver post procedure/recovery/discharge. Pt stated understanding.  Called 10/01/2023 and 11/17/2023

## 2023-11-20 NOTE — H&P (Signed)
 Chief Complaint: Patient was seen in consultation today for non-focal liver biopsy.   Referring Physician(s): Mills,Kimberly A  Supervising Physician: Luverne Aran  Patient Status: [facility] - Out-pt  History of Present Illness: Chloe Moyer is a 73 y.o. female with a medical history significant for HTN, diverticulosis and elevated LFTs dating back to 2016. She undergone multiple imaging studies including CT, US  and elastography. A liver biopsy with IR in 2018 showed mild steatohepatitis.   Her liver enzyme labs have recently increased and her GI team is requesting a repeat non-focal liver biopsy.   Past Medical History:  Diagnosis Date   Allergic rhinitis    Arthritis    Asthma    Diverticulosis, sigmoid    Family history of breast cancer    Family history of cancer of mouth    Family history of lung cancer    GERD (gastroesophageal reflux disease)    History of hiatal hernia    Hypertension 06/12/2005   Ulcer     Past Surgical History:  Procedure Laterality Date   BREAST BIOPSY Right 09/01/2017   Affirm Bx of 2 areas- both areas were fibroadenomatoid change    BREAST CYST ASPIRATION Left 1990   CARDIAC CATHETERIZATION  July 2012   CHOLECYSTECTOMY  1991   LIPOMA EXCISION  4/99   Left flank area   NASAL SINUS SURGERY  July 2012   TUBAL LIGATION  1984    Allergies: Sulfamethoxazole-trimethoprim, Lisinopril, Septra [bactrim], and Sulfa antibiotics  Medications: Prior to Admission medications   Medication Sig Start Date End Date Taking? Authorizing Provider  albuterol (PROVENTIL HFA;VENTOLIN HFA) 108 (90 BASE) MCG/ACT inhaler Inhale 2 puffs using inhaler every 4-6 hours as needed for SOB/wheeze.    [provider]  budesonide  (PULMICORT ) 0.5 MG/2ML nebulizer solution Take 0.5 mg by nebulization 2 (two) times daily.    [provider]  budesonide -formoterol  (SYMBICORT ) 160-4.5 MCG/ACT inhaler Inhale 2 puffs into the lungs daily. as directed.  03/06/16   Glendia Shad, MD  EPINEPHrine  (EPIPEN  2-PAK) 0.3 mg/0.3 mL IJ SOAJ injection Inject 0.3 mLs (0.3 mg total) into the muscle once. 02/27/16   Glendia Shad, MD  hydrOXYzine  (ATARAX ) 10 MG tablet Take 1 tablet (10 mg total) by mouth at bedtime as needed. 09/29/23   Glendia Shad, MD  Hypertonic Nasal Wash (SINUS RINSE NA) As directed three times daily    [provider]  ipratropium-albuterol (DUONEB) 0.5-2.5 (3) MG/3ML SOLN  11/04/19   [provider]  losartan  (COZAAR ) 100 MG tablet Take 1 tablet (100 mg total) by mouth daily. 04/23/23   Glendia Shad, MD  methimazole (TAPAZOLE) 5 MG tablet Take 5 mg by mouth daily.    [provider]  omalizumab  (XOLAIR ) 150 MG injection 150 mg every 30 (thirty) days.  02/01/13   [provider]  omeprazole  (PRILOSEC) 10 MG capsule Take 1 capsule (10 mg total) by mouth daily. 04/23/23   Glendia Shad, MD  rosuvastatin  (CRESTOR ) 5 MG tablet Take 1 tablet (5 mg total) by mouth 2 (two) times a week. 04/23/23   Glendia Shad, MD  Vitamin D , Ergocalciferol , (DRISDOL) 1.25 MG (50000 UNIT) CAPS capsule Take 50,000 Units by mouth once a week. 03/12/23   [provider]     Family History  Problem Relation Age of Onset   Hypertension Mother    Cancer Mother        oral dx 22   Emphysema Father        smoked  Asthma Father    Lung cancer Father        smoked; dx 45s   Hypertension Father    Cancer - Cervical Paternal Aunt    Cancer Paternal Aunt        stomach v. ovarian   Cancer Paternal Uncle        unk, possibly lung   Breast cancer Maternal Grandmother 60   Stroke Maternal Grandfather    Heart disease Paternal Grandfather        myocardial infarction   Cancer Cousin        unk types    Social History   Socioeconomic History   Marital status: Married    Spouse name: Not on file   Number of children: 2   Years of education: Not on file   Highest education level: Associate degree: academic  program  Occupational History   Occupation: Works at [employer]  Tobacco Use   Smoking status: Never   Smokeless tobacco: Never  Substance and Sexual Activity   Alcohol use: Yes    Alcohol/week: 0.0 standard drinks of alcohol    Comment: occ wine with dinner   Drug use: No   Sexual activity: Yes  Other Topics Concern   Not on file  Social History Narrative   Not on file   Social Drivers of Health   Financial Resource Strain: Low Risk  (03/18/2023)   Overall Financial Resource Strain (CARDIA)    Difficulty of Paying Living Expenses: Not hard at all  Food Insecurity: No Food Insecurity (03/18/2023)   Hunger Vital Sign    Worried About Running Out of Food in the Last Year: Never true    Ran Out of Food in the Last Year: Never true  Transportation Needs: No Transportation Needs (03/18/2023)   PRAPARE - Administrator, Civil Service (Medical): No    Lack of Transportation (Non-Medical): No  Physical Activity: Unknown (03/18/2023)   Exercise Vital Sign    Days of Exercise per Week: 2 days    Minutes of Exercise per Session: Not on file  Stress: Stress Concern Present (03/18/2023)   [employer] of Occupational Health - Occupational Stress Questionnaire    Feeling of Stress : To some extent  Social Connections: Socially Integrated (03/18/2023)   Social Connection and Isolation Panel [NHANES]    Frequency of Communication with Friends and Family: More than three times a week    Frequency of Social Gatherings with Friends and Family: Once a week    Attends Religious Services: More than 4 times per year    Active Member of [employer] or Organizations: Yes    Attends Engineer, Structural: More than 4 times per year    Marital Status: Married    Review of Systems: A 12 point ROS discussed and pertinent positives are indicated in the HPI above.  All other systems are negative.  Review of Systems  Constitutional:  Negative for appetite change and fatigue.   Respiratory:  Negative for cough and shortness of breath.   Cardiovascular:  Negative for chest pain and leg swelling.  Gastrointestinal:  Negative for abdominal pain, diarrhea, nausea and vomiting.  Genitourinary:  Negative for flank pain.  Musculoskeletal:  Negative for back pain.  Neurological:  Negative for dizziness and headaches.    Vital Signs: BP (!) 161/95   Pulse 96   Temp 97.8 F (36.6 C) (Oral)   Resp 17   Ht 5' 5 (1.651 m)   Wt  163 lb 6.4 oz (74.1 kg)   LMP 11/03/1996   SpO2 97%   BMI 27.19 kg/m   Physical Exam Constitutional:      General: She is not in acute distress.    Appearance: She is not ill-appearing.  HENT:     Mouth/Throat:     Mouth: Mucous membranes are moist.     Pharynx: Oropharynx is clear.  Cardiovascular:     Rate and Rhythm: Normal rate and regular rhythm.     Pulses: Normal pulses.     Heart sounds: Normal heart sounds.  Pulmonary:     Effort: Pulmonary effort is normal.  Abdominal:     General: Bowel sounds are normal.     Palpations: Abdomen is soft.     Tenderness: There is no abdominal tenderness.  Musculoskeletal:     Right lower leg: No edema.     Left lower leg: No edema.  Skin:    General: Skin is warm and dry.  Neurological:     Mental Status: She is alert and oriented to person, place, and time.  Psychiatric:        Mood and Affect: Mood normal.        Behavior: Behavior normal.        Thought Content: Thought content normal.        Judgment: Judgment normal.     Imaging: MM 3D SCREENING MAMMOGRAM BILATERAL BREAST Result Date: 10/27/2023 CLINICAL DATA:  Screening. EXAM: DIGITAL SCREENING BILATERAL MAMMOGRAM WITH TOMOSYNTHESIS AND CAD TECHNIQUE: Bilateral screening digital craniocaudal and mediolateral oblique mammograms were obtained. Bilateral screening digital breast tomosynthesis was performed. The images were evaluated with computer-aided detection. COMPARISON:  Previous exam(s). ACR Breast Density Category b:  There are scattered areas of fibroglandular density. FINDINGS: There are no findings suspicious for malignancy. IMPRESSION: No mammographic evidence of malignancy. A result letter of this screening mammogram will be mailed directly to the patient. RECOMMENDATION: Screening mammogram in one year. (Code:SM-B-01Y) BI-RADS CATEGORY  1: Negative. Electronically Signed   By: Rosaline Collet M.D.   On: 10/27/2023 18:10    Labs:  CBC: Recent Labs    12/17/22 0747 04/20/23 0752 11/23/23 0736  WBC 3.9* 3.9* 4.0  HGB 13.1 12.7 13.4  HCT 38.2 38.8 40.5  PLT 198.0 167.0 192    COAGS: No results for input(s): INR, APTT in the last 8760 hours.  BMP: Recent Labs    12/17/22 0747 04/20/23 0752 08/19/23 0734  NA 139 138 139  K 4.2 4.0 3.9  CL 106 105 106  CO2 26 26 26   GLUCOSE 95 102* 92  BUN 12 10 9   CALCIUM  10.0 10.1 9.6  CREATININE 0.69 0.69 0.66    LIVER FUNCTION TESTS: Recent Labs    12/17/22 0747 04/20/23 0752 08/19/23 0734 08/24/23 1323  BILITOT 0.6 0.7 0.6 0.7  AST 21 21 95* 140*  ALT 16 17 78* 115*  ALKPHOS 62 64 71 74  PROT 7.4 7.3 7.1 7.4  ALBUMIN 4.2 4.1 3.9 4.2    TUMOR MARKERS: No results for input(s): AFPTM, CEA, CA199, CHROMGRNA in the last 8760 hours.  Assessment and Plan:  Elevated liver enzymes: Cathlean MATSU. Grayer, 73 year old female, presents today to the [facility] Interventional Radiology department for an image-guided non-focal liver biopsy.   Risks and benefits of this procedure were discussed with the patient and/or patient's family including, but not limited to bleeding, infection, damage to adjacent structures or low yield requiring additional tests.  All of  the questions were answered and there is agreement to proceed. She has been NPO. She is a full code. She does not take any blood-thinning medications.  Consent signed and in chart.  Thank you for this interesting consult.  I greatly enjoyed meeting BLEN RANSOME and look forward to participating in their care.  A copy of this report was sent to the requesting provider on this date.  Electronically Signed: Warren Dais, AGACNP-BC 11/23/2023, 8:11 AM   I spent a total of  30 Minutes   in face to face in clinical consultation, greater than 50% of which was counseling/coordinating care for non-focal liver biopsy.

## 2023-11-23 ENCOUNTER — Ambulatory Visit
Admission: RE | Admit: 2023-11-23 | Discharge: 2023-11-23 | Disposition: A | Discharge status: Home / Self Care | Payer: PPO | Source: Ambulatory Visit | Attending: Nurse Practitioner | Admitting: Nurse Practitioner

## 2023-11-23 ENCOUNTER — Other Ambulatory Visit: Payer: Self-pay

## 2023-11-23 DIAGNOSIS — K7581 Nonalcoholic steatohepatitis (NASH): Secondary | ICD-10-CM | POA: Diagnosis not present

## 2023-11-23 DIAGNOSIS — I10 Essential (primary) hypertension: Secondary | ICD-10-CM | POA: Diagnosis not present

## 2023-11-23 DIAGNOSIS — K7400 Hepatic fibrosis, unspecified: Secondary | ICD-10-CM | POA: Insufficient documentation

## 2023-11-23 DIAGNOSIS — R945 Abnormal results of liver function studies: Secondary | ICD-10-CM | POA: Diagnosis not present

## 2023-11-23 DIAGNOSIS — K753 Granulomatous hepatitis, not elsewhere classified: Secondary | ICD-10-CM | POA: Diagnosis not present

## 2023-11-23 DIAGNOSIS — K5790 Diverticulosis of intestine, part unspecified, without perforation or abscess without bleeding: Secondary | ICD-10-CM | POA: Diagnosis not present

## 2023-11-23 DIAGNOSIS — R7989 Other specified abnormal findings of blood chemistry: Secondary | ICD-10-CM

## 2023-11-23 LAB — CBC
HCT: 40.5 % (ref 36.0–46.0)
Hemoglobin: 13.4 g/dL (ref 12.0–15.0)
MCH: 29.6 pg (ref 26.0–34.0)
MCHC: 33.1 g/dL (ref 30.0–36.0)
MCV: 89.6 fL (ref 80.0–100.0)
Platelets: 192 10*3/uL (ref 150–400)
RBC: 4.52 MIL/uL (ref 3.87–5.11)
RDW: 14.4 % (ref 11.5–15.5)
WBC: 4 10*3/uL (ref 4.0–10.5)
nRBC: 0 % (ref 0.0–0.2)

## 2023-11-23 LAB — PROTIME-INR
INR: 1.1 (ref 0.8–1.2)
Prothrombin Time: 14.2 s (ref 11.4–15.2)

## 2023-11-23 MED ORDER — MIDAZOLAM HCL 5 MG/5ML IJ SOLN
INTRAMUSCULAR | Status: AC | PRN
  Administered 2023-11-23 (×2): 1 mg via INTRAVENOUS

## 2023-11-23 MED ORDER — LIDOCAINE HCL (PF) 1 % IJ SOLN
10.0000 mL | Freq: Once | INTRAMUSCULAR | Status: AC
  Administered 2023-11-23: 10 mL via INTRADERMAL
  Filled 2023-11-23: qty 10

## 2023-11-23 MED ORDER — SODIUM CHLORIDE 0.9% FLUSH: 3.0000 mL | Freq: Two times a day (BID) | INTRAVENOUS | Status: DC

## 2023-11-23 MED ORDER — FENTANYL CITRATE (PF) 100 MCG/2ML IJ SOLN
INTRAMUSCULAR | Status: AC | PRN
Start: 2023-11-23 — End: 2023-11-23
  Administered 2023-11-23: 25 ug via INTRAVENOUS

## 2023-11-23 MED ORDER — SODIUM CHLORIDE 0.9% FLUSH: 3.0000 mL | INTRAVENOUS | Status: DC | PRN

## 2023-11-23 MED ORDER — SODIUM CHLORIDE 0.9 % IV SOLN: INTRAVENOUS | Status: DC

## 2023-11-23 MED ORDER — MIDAZOLAM HCL 2 MG/2ML IJ SOLN
INTRAMUSCULAR | Status: AC
  Filled 2023-11-23: qty 2

## 2023-11-23 MED ORDER — ONDANSETRON HCL 4 MG/2ML IJ SOLN
INTRAMUSCULAR | Status: AC
  Filled 2023-11-23: qty 2

## 2023-11-23 MED ORDER — ONDANSETRON HCL 4 MG/2ML IJ SOLN
4.0000 mg | Freq: Once | INTRAMUSCULAR | Status: AC
  Administered 2023-11-23: 4 mg via INTRAVENOUS

## 2023-11-23 MED ORDER — FENTANYL CITRATE (PF) 100 MCG/2ML IJ SOLN
INTRAMUSCULAR | Status: AC
  Filled 2023-11-23: qty 2

## 2023-11-23 NOTE — OR Nursing (Signed)
 Pt reports last time she had this procedure she had to stay extended time before same day discharge due to nausea and vomiting.

## 2023-11-23 NOTE — Clinical Social Work Peds Assess (Signed)
 Patient clinically stable post US  Liver biopsy per Dr Luverne rear well. Denies complaintts immediatetly post procedure, given zofran  4 mg IV pre procedure related to patient's statement of last biopsy she stayed several hours with emesis post procedure. Update given to husband at bedside post procedure. Received Versed  2 mg along with Fentanyl  25 mcg IV for procedure. No visible bleeding post procedure. Report given to Alyson Patterson RN post procedure/specials/24.

## 2023-11-23 NOTE — Procedures (Signed)
Interventional Radiology Procedure Note  Procedure: US Guided Biopsy of liver  Complications: None  Estimated Blood Loss: < 10 mL  Findings: 18 G core biopsy of liver performed under US guidance.  3 core samples obtained and sent to Pathology.  Jodi Marble. Fredia Sorrow, M.D Pager:  [phone]

## 2023-12-04 ENCOUNTER — Emergency Department
Admission: EM | Admit: 2023-12-04 | Discharge: 2023-12-04 | Disposition: A | Discharge status: Home / Self Care | Payer: PPO | Attending: Emergency Medicine | Admitting: Emergency Medicine

## 2023-12-04 ENCOUNTER — Emergency Department: Payer: PPO

## 2023-12-04 ENCOUNTER — Other Ambulatory Visit: Payer: Self-pay

## 2023-12-04 DIAGNOSIS — R1011 Right upper quadrant pain: Secondary | ICD-10-CM | POA: Diagnosis not present

## 2023-12-04 DIAGNOSIS — K429 Umbilical hernia without obstruction or gangrene: Secondary | ICD-10-CM | POA: Diagnosis not present

## 2023-12-04 DIAGNOSIS — K5730 Diverticulosis of large intestine without perforation or abscess without bleeding: Secondary | ICD-10-CM | POA: Diagnosis not present

## 2023-12-04 DIAGNOSIS — K439 Ventral hernia without obstruction or gangrene: Secondary | ICD-10-CM | POA: Diagnosis not present

## 2023-12-04 DIAGNOSIS — R109 Unspecified abdominal pain: Secondary | ICD-10-CM | POA: Diagnosis not present

## 2023-12-04 LAB — CBC
HCT: 38.9 % (ref 36.0–46.0)
Hemoglobin: 12.9 g/dL (ref 12.0–15.0)
MCH: 29.5 pg (ref 26.0–34.0)
MCHC: 33.2 g/dL (ref 30.0–36.0)
MCV: 89 fL (ref 80.0–100.0)
Platelets: 193 K/uL (ref 150–400)
RBC: 4.37 MIL/uL (ref 3.87–5.11)
RDW: 13.8 % (ref 11.5–15.5)
WBC: 4.4 K/uL (ref 4.0–10.5)
nRBC: 0 % (ref 0.0–0.2)

## 2023-12-04 LAB — URINALYSIS, ROUTINE W REFLEX MICROSCOPIC
Bilirubin Urine: NEGATIVE
Glucose, UA: NEGATIVE mg/dL
Ketones, ur: NEGATIVE mg/dL
Nitrite: NEGATIVE
Protein, ur: NEGATIVE mg/dL
Specific Gravity, Urine: 1.012 (ref 1.005–1.030)
pH: 6 (ref 5.0–8.0)

## 2023-12-04 LAB — COMPREHENSIVE METABOLIC PANEL WITH GFR
ALT: 21 U/L (ref 0–44)
AST: 23 U/L (ref 15–41)
Albumin: 4.2 g/dL (ref 3.5–5.0)
Alkaline Phosphatase: 57 U/L (ref 38–126)
Anion gap: 8 (ref 5–15)
BUN: 12 mg/dL (ref 8–23)
CO2: 24 mmol/L (ref 22–32)
Calcium: 10 mg/dL (ref 8.9–10.3)
Chloride: 107 mmol/L (ref 98–111)
Creatinine, Ser: 0.62 mg/dL (ref 0.44–1.00)
GFR, Estimated: >60 mL/min
Glucose, Bld: 118 mg/dL — ABNORMAL HIGH (ref 70–99)
Potassium: 4 mmol/L (ref 3.5–5.1)
Sodium: 139 mmol/L (ref 135–145)
Total Bilirubin: 0.5 mg/dL (ref 0.0–1.2)
Total Protein: 7.7 g/dL (ref 6.5–8.1)

## 2023-12-04 LAB — LIPASE, BLOOD: Lipase: 40 U/L (ref 11–51)

## 2023-12-04 LAB — TROPONIN I (HIGH SENSITIVITY)
Troponin I (High Sensitivity): 4 ng/L
Troponin I (High Sensitivity): 4 ng/L (ref <18)

## 2023-12-04 LAB — SURGICAL PATHOLOGY

## 2023-12-04 MED ORDER — IOHEXOL 300 MG/ML  SOLN
100.0000 mL | Freq: Once | INTRAMUSCULAR | Status: AC | PRN
  Administered 2023-12-04: 100 mL via INTRAVENOUS

## 2023-12-04 NOTE — ED Notes (Signed)
Lab called to add on urine culture.  ?

## 2023-12-04 NOTE — ED Provider Notes (Signed)
[facility] Provider Note    Event Date/Time   First MD Initiated Contact with Patient 12/04/23 0900     (approximate)   History   Abdominal Pain   HPI  Chloe Moyer is a 73 y.o. female with a history of diverticulosis, GERD, who recently had liver biopsy for mildly elevated LFTs presents with complaints of right upper quadrant abdominal pain which started overnight.  She reports it is improved significantly now.  Denies nausea or vomiting.  No diarrhea.  No fevers.  No chest pain     Physical Exam   Triage Vital Signs: ED Triage Vitals  Encounter Vitals Group     BP 12/04/23 0255 (!) 160/94     Systolic BP Percentile --      Diastolic BP Percentile --      Pulse Rate 12/04/23 0255 95     Resp 12/04/23 0255 18     Temp 12/04/23 0255 97.7 F (36.5 C)     Temp Source 12/04/23 0255 Oral     SpO2 12/04/23 0255 96 %     Weight 12/04/23 0255 73.9 kg (163 lb)     Height 12/04/23 0255 1.524 m (5')     Head Circumference --      Peak Flow --      Pain Score 12/04/23 0302 7     Pain Loc --      Pain Education --      Exclude from Growth Chart --     Most recent vital signs: Vitals:   12/04/23 0255 12/04/23 0947  BP: (!) 160/94 135/86  Pulse: 95 90  Resp: 18 16  Temp: 97.7 F (36.5 C)   SpO2: 96% 98%     General: Awake, no distress.  CV:  Good peripheral perfusion.  Resp:  Normal effort.  Abd:  No distention.  Other:     ED Results / Procedures / Treatments   Labs (all labs ordered are listed, but only abnormal results are displayed) Labs Reviewed  COMPREHENSIVE METABOLIC PANEL - Abnormal; Notable for the following components:      Result Value   Glucose, Bld 118 (*)    All other components within normal limits  URINALYSIS, ROUTINE W REFLEX MICROSCOPIC - Abnormal; Notable for the following components:   Color, Urine YELLOW (*)    APPearance HAZY (*)    Hgb urine dipstick SMALL (*)    Leukocytes,Ua LARGE (*)    Bacteria, UA  FEW (*)    All other components within normal limits  URINE CULTURE  LIPASE, BLOOD  CBC  TROPONIN I (HIGH SENSITIVITY)  TROPONIN I (HIGH SENSITIVITY)     EKG  ED ECG REPORT I, Jene Every, the attending physician, personally viewed and interpreted this ECG.  Date: 12/04/2023  Rhythm: normal sinus rhythm QRS Axis: normal Intervals: normal ST/T Wave abnormalities: Nonspecific changes Narrative Interpretation: no evidence of acute ischemia    RADIOLOGY CT abdomen pelvis without acute abnormality per radiology    PROCEDURES:  Critical Care performed:   Procedures   MEDICATIONS ORDERED IN ED: Medications  iohexol (OMNIPAQUE) 300 MG/ML solution 100 mL (100 mLs Intravenous Contrast Given 12/04/23 0610)     IMPRESSION / MDM / ASSESSMENT AND PLAN / ED COURSE  I reviewed the triage vital signs and the nursing notes. Patient's presentation is most consistent with acute presentation with potential threat to life or bodily function.  Patient presents with right upper quadrant abdominal pain status post liver  biopsy.  She reports the pain is improved now.  She thinks this may have been related to overdoing it with vacuuming recently.  She is afebrile well-appearing with reassuring abdominal exam.  Lab work generally unremarkable, CT scan is reassuring.  Urinalysis suspicious for UTI however she is asymptomatic, discussed with her option of antibiotics versus culture and she would like to wait on culture.  We have added this on.  Medication for admission at this time, appropriate for discharge with close outpatient follow-up, return precautions discussed, she agrees to this plan.        FINAL CLINICAL IMPRESSION(S) / ED DIAGNOSES   Final diagnoses:  Right upper quadrant abdominal pain     Rx / DC Orders   ED Discharge Orders     None        Note:  This document was prepared using Dragon voice recognition software and may include unintentional dictation  errors.   Jene Every, MD 12/04/23 1003

## 2023-12-04 NOTE — ED Triage Notes (Signed)
Pt arrives with c/o upper ABD pain. Per pt, she had a liver biopsy done about a week ago and yesterday she started having extreme pain where she had the biopsy done. Pt denies n/v or SOB.

## 2023-12-05 LAB — URINE CULTURE: Culture: 60000 — AB

## 2023-12-07 ENCOUNTER — Other Ambulatory Visit: Payer: Self-pay

## 2023-12-07 ENCOUNTER — Emergency Department
Admission: EM | Admit: 2023-12-07 | Discharge: 2023-12-07 | Disposition: A | Discharge status: Home / Self Care | Payer: PPO | Attending: Emergency Medicine | Admitting: Emergency Medicine

## 2023-12-07 ENCOUNTER — Emergency Department: Payer: PPO

## 2023-12-07 DIAGNOSIS — R1011 Right upper quadrant pain: Secondary | ICD-10-CM | POA: Diagnosis not present

## 2023-12-07 DIAGNOSIS — I10 Essential (primary) hypertension: Secondary | ICD-10-CM | POA: Diagnosis not present

## 2023-12-07 DIAGNOSIS — R0789 Other chest pain: Secondary | ICD-10-CM | POA: Diagnosis not present

## 2023-12-07 DIAGNOSIS — J45909 Unspecified asthma, uncomplicated: Secondary | ICD-10-CM | POA: Insufficient documentation

## 2023-12-07 DIAGNOSIS — I2510 Atherosclerotic heart disease of native coronary artery without angina pectoris: Secondary | ICD-10-CM | POA: Diagnosis not present

## 2023-12-07 DIAGNOSIS — J9811 Atelectasis: Secondary | ICD-10-CM | POA: Diagnosis not present

## 2023-12-07 LAB — CBC
HCT: 37.7 % (ref 36.0–46.0)
Hemoglobin: 12.6 g/dL (ref 12.0–15.0)
MCH: 29.6 pg (ref 26.0–34.0)
MCHC: 33.4 g/dL (ref 30.0–36.0)
MCV: 88.5 fL (ref 80.0–100.0)
Platelets: 175 10*3/uL (ref 150–400)
RBC: 4.26 MIL/uL (ref 3.87–5.11)
RDW: 14.1 % (ref 11.5–15.5)
WBC: 3.4 10*3/uL — ABNORMAL LOW (ref 4.0–10.5)
nRBC: 0 % (ref 0.0–0.2)

## 2023-12-07 LAB — URINALYSIS, ROUTINE W REFLEX MICROSCOPIC
Bacteria, UA: NONE SEEN
Bilirubin Urine: NEGATIVE
Glucose, UA: NEGATIVE mg/dL
Hgb urine dipstick: NEGATIVE
Ketones, ur: NEGATIVE mg/dL
Nitrite: NEGATIVE
Protein, ur: NEGATIVE mg/dL
Specific Gravity, Urine: 1.016 (ref 1.005–1.030)
pH: 6 (ref 5.0–8.0)

## 2023-12-07 LAB — COMPREHENSIVE METABOLIC PANEL
ALT: 18 U/L (ref 0–44)
AST: 27 U/L (ref 15–41)
Albumin: 3.9 g/dL (ref 3.5–5.0)
Alkaline Phosphatase: 51 U/L (ref 38–126)
Anion gap: 9 (ref 5–15)
BUN: 11 mg/dL (ref 8–23)
CO2: 22 mmol/L (ref 22–32)
Calcium: 9.6 mg/dL (ref 8.9–10.3)
Chloride: 105 mmol/L (ref 98–111)
Creatinine, Ser: 0.64 mg/dL (ref 0.44–1.00)
GFR, Estimated: >60 mL/min (ref >60)
Glucose, Bld: 109 mg/dL — ABNORMAL HIGH (ref 70–99)
Potassium: 4.1 mmol/L (ref 3.5–5.1)
Sodium: 136 mmol/L (ref 135–145)
Total Bilirubin: 0.8 mg/dL (ref 0.0–1.2)
Total Protein: 7.6 g/dL (ref 6.5–8.1)

## 2023-12-07 LAB — LIPASE, BLOOD: Lipase: 35 U/L (ref 11–51)

## 2023-12-07 MED ORDER — MORPHINE SULFATE (PF) 4 MG/ML IV SOLN
4.0000 mg | Freq: Once | INTRAVENOUS | Status: AC
  Administered 2023-12-07: 4 mg via INTRAVENOUS
  Filled 2023-12-07: qty 1

## 2023-12-07 MED ORDER — GABAPENTIN 100 MG PO CAPS: 100.0000 mg | ORAL_CAPSULE | Freq: Three times a day (TID) | ORAL | 2 refills | Status: DC

## 2023-12-07 MED ORDER — IOHEXOL 350 MG/ML SOLN
80.0000 mL | Freq: Once | INTRAVENOUS | Status: AC | PRN
  Administered 2023-12-07: 75 mL via INTRAVENOUS

## 2023-12-07 MED ORDER — ONDANSETRON HCL 4 MG/2ML IJ SOLN
4.0000 mg | Freq: Once | INTRAMUSCULAR | Status: AC
  Administered 2023-12-07: 4 mg via INTRAVENOUS
  Filled 2023-12-07: qty 2

## 2023-12-07 MED ORDER — FOSFOMYCIN TROMETHAMINE 3 G PO PACK
3.0000 g | PACK | Freq: Once | ORAL | Status: AC
  Administered 2023-12-07: 3 g via ORAL
  Filled 2023-12-07: qty 3

## 2023-12-07 NOTE — Discharge Instructions (Addendum)
As we discussed please begin taking your prescribed gabapentin medication.  Start with 1 capsule in the morning and 1 at night.  After 1 week you can increase to 1 capsule 3 times daily.  Please follow-up with your doctor soon as you are able.  Return to the emergency department for any significant increase in pain or any other symptom personally concerning to yourself.  As we discussed please discuss with your PCP for consideration of a stress test given the heart artery calcification.

## 2023-12-07 NOTE — ED Triage Notes (Signed)
Pt to ED via POV c/o RUQ abd pain. Pt was seen here Friday dor same. Pain hasn't gone away. Has used amoxicillin that she had from a previous infection because she thought it would help. Took advil this morning, not having any pain at this time

## 2023-12-07 NOTE — ED Provider Notes (Addendum)
[facility] Provider Note    Event Date/Time   First MD Initiated Contact with Patient 12/07/23 [id number]     (approximate)  History   Chief Complaint: Abdominal Pain  HPI  Chloe Moyer is a 73 y.o. female with a past medical history of arthritis, asthma, gastric reflux, hypertension, presents to the emergency department for right upper abdominal pain/right lower chest pain.  According to the patient approximately 2 weeks ago she had a liver biopsy performed for elevated LFTs that have been intermittent over the years.  Patient states since the biopsy she has been experiencing pain to the right upper quadrant/right lower chest.  States the pain is worse with inspiration, but also worse with movement such as bending over.  Patient was seen for the same 3 days ago had a negative CT scan of the abdomen/pelvis at that time lab work is reassuring including normal LFTs.  Patient denies any fever denies any vomiting or diarrhea.  Physical Exam   Triage Vital Signs: ED Triage Vitals  Encounter Vitals Group     BP 12/07/23 0619 (!) 163/95     Systolic BP Percentile --      Diastolic BP Percentile --      Pulse Rate 12/07/23 0619 99     Resp 12/07/23 0619 18     Temp 12/07/23 0619 97.6 F (36.4 C)     Temp Source 12/07/23 0619 Oral     SpO2 12/07/23 0619 95 %     Weight 12/07/23 0620 163 lb (73.9 kg)     Height 12/07/23 0620 5' (1.524 m)     Head Circumference --      Peak Flow --      Pain Score 12/07/23 0622 0     Pain Loc --      Pain Education --      Exclude from Growth Chart --     Most recent vital signs: Vitals:   12/07/23 0619  BP: (!) 163/95  Pulse: 99  Resp: 18  Temp: 97.6 F (36.4 C)  SpO2: 95%    General: Awake, no distress.  CV:  Good peripheral perfusion.  Regular rate and rhythm  Resp:  Normal effort.  Equal breath sounds bilaterally.  Abd:  No distention.  Soft, largely nontender.  Patient describes the area of pain in the right  lower chest 2 or 3 rib spaces up from the palpable abdomen.  No rebound or guarding.   ED Results / Procedures / Treatments   RADIOLOGY  I have reviewed and interpreted the CT images I do not see any obvious large pulmonary emboli or pneumothorax. Radiology is read the CTA is negative for PE or other acute abnormality.   MEDICATIONS ORDERED IN ED: Medications  morphine (PF) 4 MG/ML injection 4 mg (has no administration in time range)  ondansetron (ZOFRAN) injection 4 mg (has no administration in time range)  iohexol (OMNIPAQUE) 350 MG/ML injection 80 mL (75 mLs Intravenous Contrast Given 12/07/23 0735)     IMPRESSION / MDM / ASSESSMENT AND PLAN / ED COURSE  I reviewed the triage vital signs and the nursing notes.  Patient's presentation is most consistent with acute presentation with potential threat to life or bodily function.  Patient presents to the emergency department for right lower chest/right upper abdominal pain ongoing for 2 weeks since she had a liver biopsy.  I reviewed the patient's biopsy report which she had a 0.7 x 2.1 cm core biopsy performed.  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  Patient's pain could still be from her biopsy, could be more neuropathic pain from the biopsy.  We will treat pain in the emergency department.  Given the patient's complaint of more right lower chest/right upper abdomen worse with inspiration we will obtain a CTA of the chest to rule out pulmonary embolism.  As the patient had a CT scan with contrast of the abdomen 3 days ago I do not believe repeat imaging is warranted at this time of the abdomen.  Patient's vital signs reassuring, afebrile denies any fever at home.  Patient's lab work today is reassuring with a normal CBC, reassuring chemistry with normal LFTs.  Patient CT scan is negative.  Will place the patient on gabapentin have the patient follow-up with her doctor.  She has hepatology follow-up on Thursday  already scheduled.  Patient brought an outpatient urine culture that was performed several days ago showing 60,000 colonies of bacteria.  Patient states just this morning she was having some slight burning with urination we will cover with a one-time dose of fosfomycin.  FINAL CLINICAL IMPRESSION(S) / ED DIAGNOSES   Abdominal pain Chest pain    Note:  This document was prepared using Dragon voice recognition software and may include unintentional dictation errors.   Minna Antis, MD 12/07/23 0840    Minna Antis, MD 12/07/23 [phone]

## 2023-12-10 DIAGNOSIS — R7989 Other specified abnormal findings of blood chemistry: Secondary | ICD-10-CM | POA: Diagnosis not present

## 2023-12-10 DIAGNOSIS — K7581 Nonalcoholic steatohepatitis (NASH): Secondary | ICD-10-CM | POA: Diagnosis not present

## 2023-12-15 ENCOUNTER — Encounter: Payer: Self-pay | Admitting: Internal Medicine

## 2023-12-15 ENCOUNTER — Telehealth: Payer: Self-pay | Admitting: Internal Medicine

## 2023-12-15 ENCOUNTER — Ambulatory Visit (INDEPENDENT_AMBULATORY_CARE_PROVIDER_SITE_OTHER): Payer: PPO | Admitting: Internal Medicine

## 2023-12-15 VITALS — BP 122/70 | HR 88 | Temp 98.0°F | Resp 16 | Ht 60.0 in | Wt 161.0 lb

## 2023-12-15 DIAGNOSIS — E0590 Thyrotoxicosis, unspecified without thyrotoxic crisis or storm: Secondary | ICD-10-CM | POA: Diagnosis not present

## 2023-12-15 DIAGNOSIS — K219 Gastro-esophageal reflux disease without esophagitis: Secondary | ICD-10-CM

## 2023-12-15 DIAGNOSIS — R739 Hyperglycemia, unspecified: Secondary | ICD-10-CM

## 2023-12-15 DIAGNOSIS — R7989 Other specified abnormal findings of blood chemistry: Secondary | ICD-10-CM

## 2023-12-15 DIAGNOSIS — I2510 Atherosclerotic heart disease of native coronary artery without angina pectoris: Secondary | ICD-10-CM | POA: Insufficient documentation

## 2023-12-15 DIAGNOSIS — I10 Essential (primary) hypertension: Secondary | ICD-10-CM | POA: Diagnosis not present

## 2023-12-15 DIAGNOSIS — R1011 Right upper quadrant pain: Secondary | ICD-10-CM | POA: Insufficient documentation

## 2023-12-15 DIAGNOSIS — E7800 Pure hypercholesterolemia, unspecified: Secondary | ICD-10-CM

## 2023-12-15 DIAGNOSIS — J4520 Mild intermittent asthma, uncomplicated: Secondary | ICD-10-CM | POA: Diagnosis not present

## 2023-12-15 MED ORDER — ASPIRIN 81 MG PO TBEC: 81.0000 mg | DELAYED_RELEASE_TABLET | Freq: Every day | ORAL | Status: AC

## 2023-12-15 NOTE — Assessment & Plan Note (Signed)
Continue losartan.  Blood pressure as outlined.  Follow pressures.  Follow metabolic panel.

## 2023-12-15 NOTE — Assessment & Plan Note (Signed)
On crestor.  Low cholesterol diet and exercise.  Follow lipid panel and liver function tests.  Scheduled to have lipids checked next week. Discussed may need to increase dosing frequency.

## 2023-12-15 NOTE — Progress Notes (Unsigned)
Subjective:    Patient ID: Chloe Moyer, female    DOB: 1951-10-10, 73 y.o.   MRN: [id number]  Patient here for  Chief Complaint  Patient presents with  . CARDIOLOGY REFERRAL.    HPI Here for a work in appt - ER follow up. Was seen in ER 12/04/23 - RUQ pain s/p liver biopsy. CT abdomen - no acute findings in the abdomen or pelvis. Reevaluated 12/07/23 - persistent intermittent pain. CTA chest - reported as negative and she was placed on gabapentin. Was also treated with fosfomycin for UTI. Evaluated in hepatology clinic [facility]) - reassured her liver function looked good and felt this was a temporary issue - very mild hepatic steatosis and mild fibrosis. She subsequently developed some sinus symptoms. Started taking prednisone, which helped her sinus issues, but also helped her right upper quadrant pain. Pain not an issue now. GI wanted a f/u with me for a referral for LAD calcification noted on CT. She reports no chest pain. Breathing stable. No increased sob. No abdominal pain or bowel change.    Past Medical History:  Diagnosis Date  . Allergic rhinitis   . Arthritis   . Asthma   . Diverticulosis, sigmoid   . Family history of breast cancer   . Family history of cancer of mouth   . Family history of lung cancer   . GERD (gastroesophageal reflux disease)   . History of hiatal hernia   . Hypertension 06/12/2005  . Ulcer    Past Surgical History:  Procedure Laterality Date  . BREAST BIOPSY Right 09/01/2017   Affirm Bx of 2 areas- both areas were fibroadenomatoid change   . BREAST CYST ASPIRATION Left 1990  . CARDIAC CATHETERIZATION  July 2012  . CHOLECYSTECTOMY  1991  . LIPOMA EXCISION  4/99   Left flank area  . NASAL SINUS SURGERY  July 2012  . TUBAL LIGATION  1984   Family History  Problem Relation Age of Onset  . Hypertension Mother   . Cancer Mother        oral dx 1  . Emphysema Father        smoked  . Asthma Father   . Lung cancer Father        smoked; dx 76s   . Hypertension Father   . Cancer - Cervical Paternal Aunt   . Cancer Paternal Aunt        stomach v. ovarian  . Cancer Paternal Uncle        unk, possibly lung  . Breast cancer Maternal Grandmother 60  . Stroke Maternal Grandfather   . Heart disease Paternal Grandfather        myocardial infarction  . Cancer Cousin        unk types   Social History   Socioeconomic History  . Marital status: Married    Spouse name: Not on file  . Number of children: 2  . Years of education: Not on file  . Highest education level: Associate degree: academic program  Occupational History  . Occupation: Works at [employer]  Tobacco Use  . Smoking status: Never  . Smokeless tobacco: Never  Substance and Sexual Activity  . Alcohol use: Yes    Alcohol/week: 0.0 standard drinks of alcohol    Comment: occ wine with dinner  . Drug use: No  . Sexual activity: Yes  Other Topics Concern  . Not on file  Social History Narrative  . Not on file   Social  Drivers of Health   Financial Resource Strain: Low Risk  (12/11/2023)   Overall Financial Resource Strain (CARDIA)   . Difficulty of Paying Living Expenses: Not hard at all  Food Insecurity: No Food Insecurity (12/11/2023)   Hunger Vital Sign   . Worried About Programme researcher, broadcasting/film/video in the Last Year: Never true   . Ran Out of Food in the Last Year: Never true  Transportation Needs: No Transportation Needs (12/11/2023)   PRAPARE - Transportation   . Lack of Transportation (Medical): No   . Lack of Transportation (Non-Medical): No  Physical Activity: Insufficiently Active (12/11/2023)   Exercise Vital Sign   . Days of Exercise per Week: 1 day   . Minutes of Exercise per Session: 30 min  Stress: No Stress Concern Present (12/11/2023)   [employer] of Occupational Health - Occupational Stress Questionnaire   . Feeling of Stress : Only a little  Social Connections: Socially Integrated (12/11/2023)   Social Connection and Isolation Panel  [NHANES]   . Frequency of Communication with Friends and Family: More than three times a week   . Frequency of Social Gatherings with Friends and Family: Twice a week   . Attends Religious Services: More than 4 times per year   . Active Member of Clubs or Organizations: Yes   . Attends Banker Meetings: More than 4 times per year   . Marital Status: Married     Review of Systems  Constitutional:  Negative for appetite change and unexpected weight change.  HENT:  Negative for sore throat.        Recent congestion and sinus issues as outlined. Improved with prednisone.   Respiratory:  Negative for cough, chest tightness and shortness of breath.   Cardiovascular:  Negative for chest pain and palpitations.  Gastrointestinal:  Negative for abdominal pain, diarrhea, nausea and vomiting.  Genitourinary:  Negative for difficulty urinating and dysuria.  Musculoskeletal:  Negative for joint swelling and myalgias.  Skin:  Negative for color change and rash.  Neurological:  Negative for dizziness and headaches.  Psychiatric/Behavioral:  Negative for agitation and dysphoric mood.        Objective:     BP 122/70   Pulse 88   Temp 98 F (36.7 C)   Resp 16   Ht 5' (1.524 m)   Wt 161 lb (73 kg)   LMP 11/03/1996   SpO2 99%   BMI 31.44 kg/m  Wt Readings from Last 3 Encounters:  12/15/23 161 lb (73 kg)  12/07/23 163 lb (73.9 kg)  12/04/23 163 lb (73.9 kg)    Physical Exam Vitals reviewed.  Constitutional:      General: She is not in acute distress.    Appearance: Normal appearance.  HENT:     Head: Normocephalic and atraumatic.     Right Ear: External ear normal.     Left Ear: External ear normal.     Mouth/Throat:     Pharynx: No oropharyngeal exudate or posterior oropharyngeal erythema.  Eyes:     General: No scleral icterus.       Right eye: No discharge.        Left eye: No discharge.     Conjunctiva/sclera: Conjunctivae normal.  Neck:     Thyroid: No  thyromegaly.  Cardiovascular:     Rate and Rhythm: Normal rate and regular rhythm.  Pulmonary:     Effort: No respiratory distress.     Breath sounds: Normal breath sounds. No wheezing.  Abdominal:     General: Bowel sounds are normal.     Palpations: Abdomen is soft.     Tenderness: There is no abdominal tenderness.  Musculoskeletal:        General: No swelling or tenderness.     Cervical back: Neck supple. No tenderness.  Lymphadenopathy:     Cervical: No cervical adenopathy.  Skin:    Findings: No erythema or rash.  Neurological:     Mental Status: She is alert.  Psychiatric:        Mood and Affect: Mood normal.        Behavior: Behavior normal.    {Perform Simple Foot Exam  Perform Detailed exam:1} {Insert foot Exam (Optional):30965}   Outpatient Encounter Medications as of 12/15/2023  Medication Sig  . aspirin EC 81 MG tablet Take 1 tablet (81 mg total) by mouth daily.  Marland Kitchen albuterol (PROVENTIL HFA;VENTOLIN HFA) 108 (90 BASE) MCG/ACT inhaler Inhale 2 puffs using inhaler every 4-6 hours as needed for SOB/wheeze.  . budesonide (PULMICORT) 0.5 MG/2ML nebulizer solution Take 0.5 mg by nebulization 2 (two) times daily.  . budesonide-formoterol (SYMBICORT) 160-4.5 MCG/ACT inhaler Inhale 2 puffs into the lungs daily. as directed.  Marland Kitchen EPINEPHrine (EPIPEN 2-PAK) 0.3 mg/0.3 mL IJ SOAJ injection Inject 0.3 mLs (0.3 mg total) into the muscle once.  . gabapentin (NEURONTIN) 100 MG capsule Take 1 capsule (100 mg total) by mouth 3 (three) times daily.  . hydrOXYzine (ATARAX) 10 MG tablet Take 1 tablet (10 mg total) by mouth at bedtime as needed.  . Hypertonic Nasal Wash (SINUS RINSE NA) As directed three times daily  . losartan (COZAAR) 100 MG tablet Take 1 tablet (100 mg total) by mouth daily.  Marland Kitchen omeprazole (PRILOSEC) 10 MG capsule Take 1 capsule (10 mg total) by mouth daily.  . rosuvastatin (CRESTOR) 5 MG tablet Take 1 tablet (5 mg total) by mouth 2 (two) times a week.  . Vitamin D,  Ergocalciferol, (DRISDOL) 1.25 MG (50000 UNIT) CAPS capsule Take 50,000 Units by mouth once a week.  . [DISCONTINUED] ipratropium-albuterol (DUONEB) 0.5-2.5 (3) MG/3ML SOLN   . [DISCONTINUED] methimazole (TAPAZOLE) 5 MG tablet Take 5 mg by mouth daily.  . [DISCONTINUED] omalizumab (XOLAIR) 150 MG injection 150 mg every 30 (thirty) days.    No facility-administered encounter medications on file as of 12/15/2023.     Lab Results  Component Value Date   WBC 3.4 (L) 12/07/2023   HGB 12.6 12/07/2023   HCT 37.7 12/07/2023   PLT 175 12/07/2023   GLUCOSE 109 (H) 12/07/2023   CHOL 128 08/19/2023   TRIG 104.0 08/19/2023   HDL 34.10 (L) 08/19/2023   LDLCALC 73 08/19/2023   ALT 18 12/07/2023   AST 27 12/07/2023   NA 136 12/07/2023   K 4.1 12/07/2023   CL 105 12/07/2023   CREATININE 0.64 12/07/2023   BUN 11 12/07/2023   CO2 22 12/07/2023   TSH 0.01 (L) 08/20/2021   INR 1.1 11/23/2023   HGBA1C 5.7 08/19/2023   MICROALBUR 1.2 03/27/2020    CT Angio Chest PE W and/or Wo Contrast Result Date: 12/07/2023 CLINICAL DATA:  Pulmonary embolism (PE) suspected, high prob; right upper quadrant pain EXAM: CT ANGIOGRAPHY CHEST WITH CONTRAST TECHNIQUE: Multidetector CT imaging of the chest was performed using the standard protocol during bolus administration of intravenous contrast. Multiplanar CT image reconstructions and MIPs were obtained to evaluate the vascular anatomy. RADIATION DOSE REDUCTION: This exam was performed according to the departmental dose-optimization program which includes automated  exposure control, adjustment of the mA and/or kV according to patient size and/or use of iterative reconstruction technique. CONTRAST:  75mL OMNIPAQUE IOHEXOL 350 MG/ML SOLN COMPARISON:  05/27/2011 FINDINGS: Cardiovascular: Heart size normal. No pericardial effusion. Satisfactory opacification of pulmonary arteries noted, and there is no evidence of pulmonary emboli. LAD coronary calcification. Adequate contrast  opacification of the thoracic aorta with no evidence of dissection, aneurysm, or stenosis. There is classic 3-vessel brachiocephalic arch anatomy without proximal stenosis. Mediastinum/Nodes: No mass or adenopathy. Lungs/Pleura: No pleural effusion. No pneumothorax. Minimal dependent atelectasis in the bases, right greater than left. Upper Abdomen: Cholecystectomy clips.  No acute findings. Musculoskeletal: Vertebral endplate spurring at multiple levels in the mid thoracic spine. Mild spondylitic changes in the visualized lower cervical spine. Bilateral shoulder DJD. Review of the MIP images confirms the above findings. IMPRESSION: 1. Negative for acute PE or thoracic aortic dissection. 2. LAD coronary calcification. 3. Minimal dependent atelectasis in the bases. Electronically Signed   By: Corlis Leak M.D.   On: 12/07/2023 08:17       Assessment & Plan:  Hypercholesterolemia Assessment & Plan: On crestor.  Low cholesterol diet and exercise.  Follow lipid panel and liver function tests.  Scheduled to have lipids checked next week. Discussed may need to increase dosing frequency.   Orders: -     Lipid panel; Future -     Hepatic function panel; Future -     CBC with Differential/Platelet; Future  Hyperglycemia Assessment & Plan: Low carb diet and exercise.  Follow met b and a1c.   Lab Results  Component Value Date   HGBA1C 5.7 08/19/2023    Orders: -     Hemoglobin A1c; Future  Primary hypertension Assessment & Plan: Continue losartan.  Blood pressure as outlined.  Follow pressures.  Follow metabolic panel.   Orders: -     Basic metabolic panel; Future  Hyperthyroidism Assessment & Plan:  Had f/u with endocrinology 07/09/23. Recommended continuing to monitor TFTs. With osteopenia. Started on tapazole.  Discussed possible liver enzyme increase with tapazole. Continue f/u with endocrinology.    Coronary artery calcification Assessment & Plan: LAD calcification noted on CTA (during  recent ER visit). GI requested f/u with cardiology. Discussed with her today. Discussed risk factor modification. Schedule for cholesterol check. Continue crestor. Discussed may need to increase dosing frequency. Refer to cardiology for further evaluation and testing.   Orders: -     Ambulatory referral to Cardiology  Abnormal liver function tests Assessment & Plan:  Evaluated in hepatology clinic [facility]) - reassured her liver function looked good and felt this was a temporary issue - very mild hepatic steatosis and mild fibrosis.    Mild intermittent asthma without complication Assessment & Plan: Breathing overall stable.    Gastroesophageal reflux disease, unspecified whether esophagitis present Assessment & Plan: No upper symptoms reported.  Omeprazole.     RUQ pain Assessment & Plan: Resolved with prednisone.    Other orders -     Aspirin; Take 1 tablet (81 mg total) by mouth daily.     Dale [place], MD

## 2023-12-15 NOTE — Assessment & Plan Note (Signed)
LAD calcification noted on CTA (during recent ER visit). GI requested f/u with cardiology. Discussed with her today. Discussed risk factor modification. Schedule for cholesterol check. Continue crestor. Discussed may need to increase dosing frequency. Refer to cardiology for further evaluation and testing.

## 2023-12-15 NOTE — Assessment & Plan Note (Signed)
Low carb diet and exercise.  Follow met b and a1c.   Lab Results  Component Value Date   HGBA1C 5.7 08/19/2023

## 2023-12-15 NOTE — Assessment & Plan Note (Signed)
Evaluated in hepatology clinic [facility]) - reassured her liver function looked good and felt this was a temporary issue - very mild hepatic steatosis and mild fibrosis.

## 2023-12-15 NOTE — Assessment & Plan Note (Signed)
Had f/u with endocrinology 07/09/23. Recommended continuing to monitor TFTs. With osteopenia. Started on tapazole.  Discussed possible liver enzyme increase with tapazole. Continue f/u with endocrinology.

## 2023-12-15 NOTE — Assessment & Plan Note (Addendum)
Breathing overall stable.  ?

## 2023-12-15 NOTE — Telephone Encounter (Signed)
Patient need lab orders.

## 2023-12-15 NOTE — Assessment & Plan Note (Signed)
No upper symptoms reported.  Omeprazole.

## 2023-12-15 NOTE — Assessment & Plan Note (Signed)
Resolved with prednisone.

## 2023-12-16 NOTE — Telephone Encounter (Signed)
Orders placed for labs

## 2023-12-22 DIAGNOSIS — E7800 Pure hypercholesterolemia, unspecified: Secondary | ICD-10-CM | POA: Diagnosis not present

## 2023-12-22 DIAGNOSIS — I2510 Atherosclerotic heart disease of native coronary artery without angina pectoris: Secondary | ICD-10-CM | POA: Diagnosis not present

## 2023-12-22 DIAGNOSIS — R0602 Shortness of breath: Secondary | ICD-10-CM | POA: Diagnosis not present

## 2023-12-22 DIAGNOSIS — I10 Essential (primary) hypertension: Secondary | ICD-10-CM | POA: Diagnosis not present

## 2023-12-23 ENCOUNTER — Other Ambulatory Visit (INDEPENDENT_AMBULATORY_CARE_PROVIDER_SITE_OTHER): Payer: PPO

## 2023-12-23 DIAGNOSIS — I10 Essential (primary) hypertension: Secondary | ICD-10-CM | POA: Diagnosis not present

## 2023-12-23 DIAGNOSIS — R739 Hyperglycemia, unspecified: Secondary | ICD-10-CM

## 2023-12-23 DIAGNOSIS — E7800 Pure hypercholesterolemia, unspecified: Secondary | ICD-10-CM | POA: Diagnosis not present

## 2023-12-23 DIAGNOSIS — E0590 Thyrotoxicosis, unspecified without thyrotoxic crisis or storm: Secondary | ICD-10-CM | POA: Diagnosis not present

## 2023-12-23 LAB — BASIC METABOLIC PANEL
BUN: 10 mg/dL (ref 6–23)
CO2: 28 meq/L (ref 19–32)
Calcium: 9.8 mg/dL (ref 8.4–10.5)
Chloride: 105 meq/L (ref 96–112)
Creatinine, Ser: 0.62 mg/dL (ref 0.40–1.20)
GFR: 87.87 mL/min (ref >60.00)
Glucose, Bld: 92 mg/dL (ref 70–99)
Potassium: 4 meq/L (ref 3.5–5.1)
Sodium: 139 meq/L (ref 135–145)

## 2023-12-23 LAB — CBC WITH DIFFERENTIAL/PLATELET
Basophils Absolute: 0 10*3/uL (ref 0.0–0.1)
Basophils Relative: 0.8 % (ref 0.0–3.0)
Eosinophils Absolute: 0.3 10*3/uL (ref 0.0–0.7)
Eosinophils Relative: 8.3 % — ABNORMAL HIGH (ref 0.0–5.0)
HCT: 40.5 % (ref 36.0–46.0)
Hemoglobin: 13.2 g/dL (ref 12.0–15.0)
Lymphocytes Relative: 38.7 % (ref 12.0–46.0)
Lymphs Abs: 1.4 10*3/uL (ref 0.7–4.0)
MCHC: 32.5 g/dL (ref 30.0–36.0)
MCV: 89.6 fL (ref 78.0–100.0)
Monocytes Absolute: 0.4 10*3/uL (ref 0.1–1.0)
Monocytes Relative: 12.2 % — ABNORMAL HIGH (ref 3.0–12.0)
Neutro Abs: 1.4 10*3/uL (ref 1.4–7.7)
Neutrophils Relative %: 40 % — ABNORMAL LOW (ref 43.0–77.0)
Platelets: 191 10*3/uL (ref 150.0–400.0)
RBC: 4.52 Mil/uL (ref 3.87–5.11)
RDW: 14.1 % (ref 11.5–15.5)
WBC: 3.6 10*3/uL — ABNORMAL LOW (ref 4.0–10.5)

## 2023-12-23 LAB — T4, FREE: Free T4: 0.72 ng/dL (ref 0.60–1.60)

## 2023-12-23 LAB — LIPID PANEL
Cholesterol: 133 mg/dL (ref 0–200)
HDL: 57.8 mg/dL (ref >39.00)
LDL Cholesterol: 56 mg/dL (ref 0–99)
NonHDL: 74.74
Total CHOL/HDL Ratio: 2
Triglycerides: 94 mg/dL (ref 0.0–149.0)
VLDL: 18.8 mg/dL (ref 0.0–40.0)

## 2023-12-23 LAB — HEPATIC FUNCTION PANEL
ALT: 14 U/L (ref 0–35)
AST: 19 U/L (ref 0–37)
Albumin: 4.1 g/dL (ref 3.5–5.2)
Alkaline Phosphatase: 53 U/L (ref 39–117)
Bilirubin, Direct: 0.2 mg/dL (ref 0.0–0.3)
Total Bilirubin: 0.7 mg/dL (ref 0.2–1.2)
Total Protein: 7.2 g/dL (ref 6.0–8.3)

## 2023-12-23 LAB — TSH: TSH: 0.2 u[IU]/mL — ABNORMAL LOW (ref 0.35–5.50)

## 2023-12-23 LAB — HEMOGLOBIN A1C: Hgb A1c MFr Bld: 5.8 % (ref 4.6–6.5)

## 2023-12-23 LAB — T3, FREE: T3, Free: 3.7 pg/mL (ref 2.3–4.2)

## 2023-12-28 ENCOUNTER — Ambulatory Visit (INDEPENDENT_AMBULATORY_CARE_PROVIDER_SITE_OTHER): Payer: PPO

## 2023-12-28 ENCOUNTER — Ambulatory Visit (INDEPENDENT_AMBULATORY_CARE_PROVIDER_SITE_OTHER): Payer: PPO | Admitting: Internal Medicine

## 2023-12-28 VITALS — BP 122/72 | HR 88 | Temp 98.0°F | Resp 16 | Ht 65.0 in | Wt 163.2 lb

## 2023-12-28 DIAGNOSIS — I2510 Atherosclerotic heart disease of native coronary artery without angina pectoris: Secondary | ICD-10-CM | POA: Diagnosis not present

## 2023-12-28 DIAGNOSIS — I10 Essential (primary) hypertension: Secondary | ICD-10-CM

## 2023-12-28 DIAGNOSIS — R0781 Pleurodynia: Secondary | ICD-10-CM | POA: Diagnosis not present

## 2023-12-28 DIAGNOSIS — E0590 Thyrotoxicosis, unspecified without thyrotoxic crisis or storm: Secondary | ICD-10-CM

## 2023-12-28 DIAGNOSIS — E7800 Pure hypercholesterolemia, unspecified: Secondary | ICD-10-CM | POA: Diagnosis not present

## 2023-12-28 DIAGNOSIS — F439 Reaction to severe stress, unspecified: Secondary | ICD-10-CM | POA: Diagnosis not present

## 2023-12-28 DIAGNOSIS — R739 Hyperglycemia, unspecified: Secondary | ICD-10-CM | POA: Diagnosis not present

## 2023-12-28 DIAGNOSIS — K219 Gastro-esophageal reflux disease without esophagitis: Secondary | ICD-10-CM | POA: Diagnosis not present

## 2023-12-28 DIAGNOSIS — J4520 Mild intermittent asthma, uncomplicated: Secondary | ICD-10-CM | POA: Diagnosis not present

## 2023-12-28 DIAGNOSIS — M25511 Pain in right shoulder: Secondary | ICD-10-CM

## 2023-12-28 DIAGNOSIS — R1011 Right upper quadrant pain: Secondary | ICD-10-CM

## 2023-12-28 DIAGNOSIS — R0789 Other chest pain: Secondary | ICD-10-CM

## 2023-12-28 DIAGNOSIS — Z0000 Encounter for general adult medical examination without abnormal findings: Secondary | ICD-10-CM

## 2023-12-28 MED ORDER — HYDROXYZINE HCL 10 MG PO TABS: 10.0000 mg | ORAL_TABLET | Freq: Every evening | ORAL | 0 refills | Status: DC | PRN

## 2023-12-28 NOTE — Assessment & Plan Note (Signed)
 On crestor .  Low cholesterol diet and exercise.  Follow lipid panel and liver function tests.  Lab Results  Component Value Date   CHOL 133 12/23/2023   HDL 57.80 12/23/2023   LDLCALC 56 12/23/2023   TRIG 94.0 12/23/2023   CHOLHDL 2 12/23/2023

## 2023-12-28 NOTE — Assessment & Plan Note (Signed)
 Physical today 12/28/23.  Mammogram 10/26/23- Birads I.  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  No f/u recommended.  (Dr Emerick Hanlon)

## 2023-12-28 NOTE — Progress Notes (Signed)
Subjective:    Patient ID: Chloe Moyer, female    DOB: September 21, 1951, 73 y.o.   MRN: [id number]  Patient here for  Chief Complaint  Patient presents with   Annual Exam    HPI Here for a physical exam. Had CT angio chest on 12/07/2023 which was negative for acute PE or thoracic aortic dissection although noted LAD coronary calcification. Was referred to cardiology for further evaluation. Saw cardiology 12/22/23. Recommended myoview and echo. Continue aspirin and crestor. Continue losartan. Breathing stable. No increased sob. No increased cough. Recently evaluated in hepatology clinic [facility]) - reassured her liver function looked good and felt this was a temporary issue - very mild hepatic steatosis and mild fibrosis. Does report some discomfort - right side - ant/lat rib. Some discomfort/itching. No rash. No trauma.    Past Medical History:  Diagnosis Date   Allergic rhinitis    Arthritis    Asthma    Diverticulosis, sigmoid    Family history of breast cancer    Family history of cancer of mouth    Family history of lung cancer    GERD (gastroesophageal reflux disease)    History of hiatal hernia    Hypertension 06/12/2005   Ulcer    Past Surgical History:  Procedure Laterality Date   BREAST BIOPSY Right 09/01/2017   Affirm Bx of 2 areas- both areas were fibroadenomatoid change    BREAST CYST ASPIRATION Left 1990   CARDIAC CATHETERIZATION  July 2012   CHOLECYSTECTOMY  1991   LIPOMA EXCISION  4/99   Left flank area   NASAL SINUS SURGERY  July 2012   TUBAL LIGATION  1984   Family History  Problem Relation Age of Onset   Hypertension Mother    Cancer Mother        oral dx 39   Emphysema Father        smoked   Asthma Father    Lung cancer Father        smoked; dx 19s   Hypertension Father    Cancer - Cervical Paternal Aunt    Cancer Paternal Aunt        stomach v. ovarian   Cancer Paternal Uncle        unk, possibly lung   Breast cancer Maternal Grandmother 60    Stroke Maternal Grandfather    Heart disease Paternal Grandfather        myocardial infarction   Cancer Cousin        unk types   Social History   Socioeconomic History   Marital status: Married    Spouse name: Not on file   Number of children: 2   Years of education: Not on file   Highest education level: Associate degree: academic program  Occupational History   Occupation: Works at [employer] development [employer]  Tobacco Use   Smoking status: Never   Smokeless tobacco: Never  Substance and Sexual Activity   Alcohol use: Yes    Alcohol/week: 0.0 standard drinks of alcohol    Comment: occ wine with dinner   Drug use: No   Sexual activity: Yes  Other Topics Concern   Not on file  Social History Narrative   Not on file   Social Drivers of Health   Financial Resource Strain: Low Risk  (12/11/2023)   Overall Financial Resource Strain (CARDIA)    Difficulty of Paying Living Expenses: Not hard at all  Food Insecurity: No Food Insecurity (12/11/2023)   Hunger Vital Sign  Worried About Programme researcher, broadcasting/film/video in the Last Year: Never true    Ran Out of Food in the Last Year: Never true  Transportation Needs: No Transportation Needs (12/11/2023)   PRAPARE - Administrator, Civil Service (Medical): No    Lack of Transportation (Non-Medical): No  Physical Activity: Insufficiently Active (12/11/2023)   Exercise Vital Sign    Days of Exercise per Week: 1 day    Minutes of Exercise per Session: 30 min  Stress: No Stress Concern Present (12/11/2023)   [employer] of Occupational Health - Occupational Stress Questionnaire    Feeling of Stress : Only a little  Social Connections: Socially Integrated (12/11/2023)   Social Connection and Isolation Panel [NHANES]    Frequency of Communication with Friends and Family: More than three times a week    Frequency of Social Gatherings with Friends and Family: Twice a week    Attends Religious Services: More than 4 times per year    Active  Member of [employer] or Organizations: Yes    Attends Engineer, structural: More than 4 times per year    Marital Status: Married     Review of Systems  Constitutional:  Negative for appetite change and unexpected weight change.  HENT:  Negative for congestion, sinus pressure and sore throat.   Eyes:  Negative for pain and visual disturbance.  Respiratory:  Negative for cough and chest tightness.        Breathing stable. No increased cough.   Cardiovascular:  Negative for chest pain and palpitations.  Gastrointestinal:  Negative for abdominal pain, diarrhea, nausea and vomiting.  Genitourinary:  Negative for difficulty urinating and dysuria.  Musculoskeletal:  Negative for joint swelling and myalgias.       Right rib discomfort as outlined.   Skin:  Negative for color change and rash.  Neurological:  Negative for dizziness and headaches.  Hematological:  Negative for adenopathy. Does not bruise/bleed easily.  Psychiatric/Behavioral:  Negative for agitation and dysphoric mood.        Objective:     BP 122/72   Pulse 88   Temp 98 F (36.7 C)   Resp 16   Ht 5\' 5"  (1.651 m)   Wt 163 lb 3.2 oz (74 kg)   LMP 11/03/1996   SpO2 98%   BMI 27.16 kg/m  Wt Readings from Last 3 Encounters:  12/28/23 163 lb 3.2 oz (74 kg)  12/15/23 161 lb (73 kg)  12/07/23 163 lb (73.9 kg)    Physical Exam Vitals reviewed.  Constitutional:      General: She is not in acute distress.    Appearance: Normal appearance. She is well-developed.  HENT:     Head: Normocephalic and atraumatic.     Right Ear: External ear normal.     Left Ear: External ear normal.     Mouth/Throat:     Pharynx: No oropharyngeal exudate or posterior oropharyngeal erythema.  Eyes:     General: No scleral icterus.       Right eye: No discharge.        Left eye: No discharge.     Conjunctiva/sclera: Conjunctivae normal.  Neck:     Thyroid: No thyromegaly.  Cardiovascular:     Rate and Rhythm: Normal rate and  regular rhythm.  Pulmonary:     Effort: No tachypnea, accessory muscle usage or respiratory distress.     Breath sounds: Normal breath sounds. No decreased breath sounds or wheezing.  Chest:  Breasts:    Right: No inverted nipple, mass, nipple discharge or tenderness (no axillary adenopathy).     Left: No inverted nipple, mass, nipple discharge or tenderness (no axilarry adenopathy).  Abdominal:     General: Bowel sounds are normal.     Palpations: Abdomen is soft.     Tenderness: There is no abdominal tenderness.  Musculoskeletal:        General: No swelling or tenderness.     Cervical back: Neck supple.  Lymphadenopathy:     Cervical: No cervical adenopathy.  Skin:    Findings: No erythema or rash.  Neurological:     Mental Status: She is alert and oriented to person, place, and time.  Psychiatric:        Mood and Affect: Mood normal.        Behavior: Behavior normal.         Outpatient Encounter Medications as of 12/28/2023  Medication Sig   albuterol (PROVENTIL HFA;VENTOLIN HFA) 108 (90 BASE) MCG/ACT inhaler Inhale 2 puffs using inhaler every 4-6 hours as needed for SOB/wheeze.   aspirin EC 81 MG tablet Take 1 tablet (81 mg total) by mouth daily.   budesonide (PULMICORT) 0.5 MG/2ML nebulizer solution Take 0.5 mg by nebulization 2 (two) times daily.   budesonide-formoterol (SYMBICORT) 160-4.5 MCG/ACT inhaler Inhale 2 puffs into the lungs daily. as directed.   EPINEPHrine (EPIPEN 2-PAK) 0.3 mg/0.3 mL IJ SOAJ injection Inject 0.3 mLs (0.3 mg total) into the muscle once.   gabapentin (NEURONTIN) 100 MG capsule Take 1 capsule (100 mg total) by mouth 3 (three) times daily.   hydrOXYzine (ATARAX) 10 MG tablet Take 1 tablet (10 mg total) by mouth at bedtime as needed.   Hypertonic Nasal Wash (SINUS RINSE NA) As directed three times daily   losartan (COZAAR) 100 MG tablet Take 1 tablet (100 mg total) by mouth daily.   omeprazole (PRILOSEC) 10 MG capsule Take 1 capsule (10 mg total)  by mouth daily.   rosuvastatin (CRESTOR) 5 MG tablet Take 1 tablet (5 mg total) by mouth 2 (two) times a week.   Vitamin D, Ergocalciferol, (DRISDOL) 1.25 MG (50000 UNIT) CAPS capsule Take 50,000 Units by mouth once a week.   [DISCONTINUED] hydrOXYzine (ATARAX) 10 MG tablet Take 1 tablet (10 mg total) by mouth at bedtime as needed.   No facility-administered encounter medications on file as of 12/28/2023.     Lab Results  Component Value Date   WBC 3.6 (L) 12/23/2023   HGB 13.2 12/23/2023   HCT 40.5 12/23/2023   PLT 191.0 12/23/2023   GLUCOSE 92 12/23/2023   CHOL 133 12/23/2023   TRIG 94.0 12/23/2023   HDL 57.80 12/23/2023   LDLCALC 56 12/23/2023   ALT 14 12/23/2023   AST 19 12/23/2023   NA 139 12/23/2023   K 4.0 12/23/2023   CL 105 12/23/2023   CREATININE 0.62 12/23/2023   BUN 10 12/23/2023   CO2 28 12/23/2023   TSH 0.20 (L) 12/23/2023   INR 1.1 11/23/2023   HGBA1C 5.8 12/23/2023   MICROALBUR 1.2 03/27/2020    CT Angio Chest PE W and/or Wo Contrast Result Date: 12/07/2023 CLINICAL DATA:  Pulmonary embolism (PE) suspected, high prob; right upper quadrant pain EXAM: CT ANGIOGRAPHY CHEST WITH CONTRAST TECHNIQUE: Multidetector CT imaging of the chest was performed using the standard protocol during bolus administration of intravenous contrast. Multiplanar CT image reconstructions and MIPs were obtained to evaluate the vascular anatomy. RADIATION DOSE REDUCTION: This exam was performed according  to the departmental dose-optimization program which includes automated exposure control, adjustment of the mA and/or kV according to patient size and/or use of iterative reconstruction technique. CONTRAST:  75mL OMNIPAQUE IOHEXOL 350 MG/ML SOLN COMPARISON:  05/27/2011 FINDINGS: Cardiovascular: Heart size normal. No pericardial effusion. Satisfactory opacification of pulmonary arteries noted, and there is no evidence of pulmonary emboli. LAD coronary calcification. Adequate contrast opacification  of the thoracic aorta with no evidence of dissection, aneurysm, or stenosis. There is classic 3-vessel brachiocephalic arch anatomy without proximal stenosis. Mediastinum/Nodes: No mass or adenopathy. Lungs/Pleura: No pleural effusion. No pneumothorax. Minimal dependent atelectasis in the bases, right greater than left. Upper Abdomen: Cholecystectomy clips.  No acute findings. Musculoskeletal: Vertebral endplate spurring at multiple levels in the mid thoracic spine. Mild spondylitic changes in the visualized lower cervical spine. Bilateral shoulder DJD. Review of the MIP images confirms the above findings. IMPRESSION: 1. Negative for acute PE or thoracic aortic dissection. 2. LAD coronary calcification. 3. Minimal dependent atelectasis in the bases. Electronically Signed   By: Corlis Leak M.D.   On: 12/07/2023 08:17       Assessment & Plan:  Health care maintenance Assessment & Plan: Physical today 12/28/23.  Mammogram 10/26/23- Birads I.  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  No f/u recommended.  (Dr Mia Creek)   Hypercholesterolemia Assessment & Plan: On crestor.  Low cholesterol diet and exercise.  Follow lipid panel and liver function tests.  Lab Results  Component Value Date   CHOL 133 12/23/2023   HDL 57.80 12/23/2023   LDLCALC 56 12/23/2023   TRIG 94.0 12/23/2023   CHOLHDL 2 12/23/2023     Orders: -     Hepatic function panel; Future -     Lipid panel; Future  Hyperglycemia Assessment & Plan: Low carb diet and exercise.  Follow met b and a1c.   Lab Results  Component Value Date   HGBA1C 5.8 12/23/2023    Orders: -     Hemoglobin A1c; Future  Primary hypertension Assessment & Plan: Continue losartan.  Blood pressure as outlined.  Follow pressures.  Follow metabolic panel.   Orders: -     Basic metabolic panel; Future  Rib pain on right side Assessment & Plan: Pain previously improved with prednisone. Persistent issue for her. Discussed further w/up.  Discussed possible costochondritis, etc. Check xray.   Orders: -     DG Ribs Unilateral Right; Future  Stress Assessment & Plan: Hydroxyzine prescribed to have prn.  Did not tolerate lorazepam. She is dealing with stress.  Feels better. Discussed today. Overall doing better. Follow.     Right shoulder pain, unspecified chronicity Assessment & Plan: Completed PT.  Continue home exercise.     RUQ pain Assessment & Plan: Previously had improved with prednisone. Appears to be more localized over her ribs. Check xray.   Hyperthyroidism Assessment & Plan:  Had f/u with endocrinology 07/09/23. Recommended continuing to monitor TFTs. With osteopenia. Started on tapazole.  Discussed possible liver enzyme increase with tapazole. Tapazole stopped. Continue f/u with endocrinology. Recent tsh .20 with normal free T3 and free T4.    Gastroesophageal reflux disease, unspecified whether esophagitis present Assessment & Plan: No upper symptoms reported.  Omeprazole.     Coronary artery calcification Assessment & Plan: LAD calcification noted on CTA (during recent ER visit). GI requested f/u with cardiology. Discussed with her today. Discussed risk factor modification. Schedule for cholesterol check. Continue crestor. Discussed may need to increase dosing frequency. Referred to cardiology for further  evaluation and testing. Recommended echo and myoview.     Mild intermittent asthma without complication Assessment & Plan: Breathing overall stable.    Other orders -     hydrOXYzine HCl; Take 1 tablet (10 mg total) by mouth at bedtime as needed.  Dispense: 30 tablet; Refill: 0     Dale Butterfield, MD

## 2024-01-03 ENCOUNTER — Encounter: Payer: Self-pay | Admitting: Internal Medicine

## 2024-01-03 NOTE — Assessment & Plan Note (Signed)
 Continue losartan.  Blood pressure as outlined.  Follow pressures.  Follow metabolic panel.

## 2024-01-03 NOTE — Assessment & Plan Note (Signed)
Completed PT.  Continue home exercise.

## 2024-01-03 NOTE — Assessment & Plan Note (Signed)
Low carb diet and exercise.  Follow met b and a1c.   Lab Results  Component Value Date   HGBA1C 5.8 12/23/2023

## 2024-01-03 NOTE — Assessment & Plan Note (Signed)
Hydroxyzine prescribed to have prn.  Did not tolerate lorazepam. She is dealing with stress.  Feels better. Discussed today. Overall doing better. Follow.

## 2024-01-03 NOTE — Assessment & Plan Note (Signed)
Pain previously improved with prednisone. Persistent issue for her. Discussed further w/up. Discussed possible costochondritis, etc. Check xray.

## 2024-01-03 NOTE — Assessment & Plan Note (Signed)
Previously had improved with prednisone. Appears to be more localized over her ribs. Check xray.

## 2024-01-03 NOTE — Assessment & Plan Note (Signed)
 Breathing overall stable.  ?

## 2024-01-03 NOTE — Assessment & Plan Note (Signed)
 No upper symptoms reported.  Omeprazole.

## 2024-01-03 NOTE — Assessment & Plan Note (Signed)
LAD calcification noted on CTA (during recent ER visit). GI requested f/u with cardiology. Discussed with her today. Discussed risk factor modification. Schedule for cholesterol check. Continue crestor. Discussed may need to increase dosing frequency. Referred to cardiology for further evaluation and testing. Recommended echo and myoview.

## 2024-01-03 NOTE — Assessment & Plan Note (Signed)
Had f/u with endocrinology 07/09/23. Recommended continuing to monitor TFTs. With osteopenia. Started on tapazole.  Discussed possible liver enzyme increase with tapazole. Tapazole stopped. Continue f/u with endocrinology. Recent tsh .20 with normal free T3 and free T4.

## 2024-01-04 ENCOUNTER — Encounter: Payer: Self-pay | Admitting: Family Medicine

## 2024-01-04 ENCOUNTER — Ambulatory Visit (INDEPENDENT_AMBULATORY_CARE_PROVIDER_SITE_OTHER): Payer: PPO | Admitting: Family Medicine

## 2024-01-04 ENCOUNTER — Encounter: Payer: Self-pay | Admitting: Internal Medicine

## 2024-01-04 VITALS — BP 122/72 | HR 100 | Temp 98.2°F | Resp 16 | Ht 65.0 in | Wt 163.0 lb

## 2024-01-04 DIAGNOSIS — R300 Dysuria: Secondary | ICD-10-CM | POA: Insufficient documentation

## 2024-01-04 LAB — URINALYSIS, MICROSCOPIC ONLY

## 2024-01-04 LAB — POCT URINALYSIS DIPSTICK
Bilirubin, UA: NEGATIVE
Blood, UA: NEGATIVE
Glucose, UA: NEGATIVE
Ketones, UA: NEGATIVE
Nitrite, UA: NEGATIVE
Protein, UA: NEGATIVE
Spec Grav, UA: 1.025 (ref 1.010–1.025)
Urobilinogen, UA: 1 U/dL
pH, UA: 6.5 (ref 5.0–8.0)

## 2024-01-04 MED ORDER — NITROFURANTOIN MONOHYD MACRO 100 MG PO CAPS: 100.0000 mg | ORAL_CAPSULE | Freq: Two times a day (BID) | ORAL | 0 refills | Status: DC

## 2024-01-04 NOTE — Assessment & Plan Note (Addendum)
Concerning for UTI based on symptoms.  Urinalysis with leukocytes.  Will proceed with empiric treatment for UTI with Macrobid 100 mg twice daily for 7 days.  Will send urine for culture microscopy as well.  Counseled on the risk of diarrhea with antibiotics.  If it is excessive diarrhea she will let us know.

## 2024-01-04 NOTE — Progress Notes (Signed)
Marikay Alar, MD Phone: [phone]  Chloe Moyer is a 73 y.o. female who presents today for same day visit.   Dysuria: Onset of symptoms yesterday.  Had some urinary frequency yesterday.  Has urgency and dysuria today.  No hematuria, abdominal pain, or vaginal discharge.  She did take 2 cranberry tablets.  She reports being treated for UTI back in January during an ED evaluation.  They treated her with fosfomycin.  She notes she was not having any symptoms at that time.  Social History   Tobacco Use  Smoking Status Never  Smokeless Tobacco Never    Current Outpatient Medications on File Prior to Visit  Medication Sig Dispense Refill   albuterol (PROVENTIL HFA;VENTOLIN HFA) 108 (90 BASE) MCG/ACT inhaler Inhale 2 puffs using inhaler every 4-6 hours as needed for SOB/wheeze.     aspirin EC 81 MG tablet Take 1 tablet (81 mg total) by mouth daily.     budesonide (PULMICORT) 0.5 MG/2ML nebulizer solution Take 0.5 mg by nebulization 2 (two) times daily.     budesonide-formoterol (SYMBICORT) 160-4.5 MCG/ACT inhaler Inhale 2 puffs into the lungs daily. as directed. 1 Inhaler 2   EPINEPHrine (EPIPEN 2-PAK) 0.3 mg/0.3 mL IJ SOAJ injection Inject 0.3 mLs (0.3 mg total) into the muscle once. 1 Device 1   gabapentin (NEURONTIN) 100 MG capsule Take 1 capsule (100 mg total) by mouth 3 (three) times daily. 90 capsule 2   hydrOXYzine (ATARAX) 10 MG tablet Take 1 tablet (10 mg total) by mouth at bedtime as needed. 30 tablet 0   Hypertonic Nasal Wash (SINUS RINSE NA) As directed three times daily     losartan (COZAAR) 100 MG tablet Take 1 tablet (100 mg total) by mouth daily. 90 tablet 3   omeprazole (PRILOSEC) 10 MG capsule Take 1 capsule (10 mg total) by mouth daily. 90 capsule 3   rosuvastatin (CRESTOR) 5 MG tablet Take 1 tablet (5 mg total) by mouth 2 (two) times a week. 26 tablet 3   Vitamin D, Ergocalciferol, (DRISDOL) 1.25 MG (50000 UNIT) CAPS capsule Take 50,000 Units by mouth once a week.      No current facility-administered medications on file prior to visit.     ROS see history of present illness  Objective  Physical Exam Vitals:   01/04/24 1044  BP: 122/72  Pulse: 100  Resp: 16  Temp: 98.2 F (36.8 C)  SpO2: 98%    BP Readings from Last 3 Encounters:  01/04/24 122/72  12/28/23 122/72  12/15/23 122/70   Wt Readings from Last 3 Encounters:  01/04/24 163 lb (73.9 kg)  12/28/23 163 lb 3.2 oz (74 kg)  12/15/23 161 lb (73 kg)    Physical Exam Constitutional:      General: She is not in acute distress. Abdominal:     General: Bowel sounds are normal. There is no distension.     Palpations: Abdomen is soft.     Tenderness: There is no abdominal tenderness.  Neurological:     Mental Status: She is alert.      Assessment/Plan: Please see individual problem list.  Dysuria Assessment & Plan: Concerning for UTI based on symptoms.  Urinalysis with leukocytes.  Will proceed with empiric treatment for UTI with Macrobid 100 mg twice daily for 7 days.  Will send urine for culture microscopy as well.  Counseled on the risk of diarrhea with antibiotics.  If it is excessive diarrhea she will let us know.  Orders: -  POCT urinalysis dipstick -     Urine Microscopic -     Urine Culture -     Nitrofurantoin Monohyd Macro; Take 1 capsule (100 mg total) by mouth 2 (two) times daily.  Dispense: 14 capsule; Refill: 0    Return if symptoms worsen or fail to improve.   Marikay Alar, MD [facility]

## 2024-01-04 NOTE — Patient Instructions (Signed)
Nice to see you. We are starting you on Macrobid 100 mg twice daily for 7 days.  We will send your urine for culture.  We will contact you with that result.  If you are not starting to improve in the next few days please let us know.

## 2024-01-05 DIAGNOSIS — I2510 Atherosclerotic heart disease of native coronary artery without angina pectoris: Secondary | ICD-10-CM | POA: Diagnosis not present

## 2024-01-05 DIAGNOSIS — R0602 Shortness of breath: Secondary | ICD-10-CM | POA: Diagnosis not present

## 2024-01-05 LAB — URINE CULTURE
MICRO NUMBER:: 16092674
Result:: NO GROWTH
SPECIMEN QUALITY:: ADEQUATE

## 2024-01-19 DIAGNOSIS — I10 Essential (primary) hypertension: Secondary | ICD-10-CM | POA: Diagnosis not present

## 2024-01-19 DIAGNOSIS — E7800 Pure hypercholesterolemia, unspecified: Secondary | ICD-10-CM | POA: Diagnosis not present

## 2024-01-19 DIAGNOSIS — I2510 Atherosclerotic heart disease of native coronary artery without angina pectoris: Secondary | ICD-10-CM | POA: Diagnosis not present

## 2024-01-19 DIAGNOSIS — Z01812 Encounter for preprocedural laboratory examination: Secondary | ICD-10-CM | POA: Diagnosis not present

## 2024-01-28 ENCOUNTER — Encounter
Admission: RE | Disposition: A | Discharge status: Home / Self Care | Payer: Self-pay | Source: Home / Self Care | Attending: Internal Medicine

## 2024-01-28 ENCOUNTER — Encounter: Payer: Self-pay | Admitting: Internal Medicine

## 2024-01-28 ENCOUNTER — Other Ambulatory Visit: Payer: Self-pay

## 2024-01-28 ENCOUNTER — Ambulatory Visit
Admission: RE | Admit: 2024-01-28 | Discharge: 2024-01-28 | Disposition: A | Discharge status: Home / Self Care | Attending: Internal Medicine | Admitting: Internal Medicine

## 2024-01-28 DIAGNOSIS — R9430 Abnormal result of cardiovascular function study, unspecified: Secondary | ICD-10-CM | POA: Insufficient documentation

## 2024-01-28 DIAGNOSIS — R079 Chest pain, unspecified: Secondary | ICD-10-CM | POA: Diagnosis not present

## 2024-01-28 HISTORY — PX: LEFT HEART CATH AND CORONARY ANGIOGRAPHY: CATH118249

## 2024-01-28 LAB — CARDIAC CATHETERIZATION: Cath EF Quantitative: 60 %

## 2024-01-28 SURGERY — LEFT HEART CATH AND CORONARY ANGIOGRAPHY
Anesthesia: Moderate Sedation | Laterality: Left

## 2024-01-28 MED ORDER — FENTANYL CITRATE (PF) 100 MCG/2ML IJ SOLN
INTRAMUSCULAR | Status: AC
  Filled 2024-01-28: qty 2

## 2024-01-28 MED ORDER — SODIUM CHLORIDE 0.9 % WEIGHT BASED INFUSION: 3.0000 mL/kg/h | INTRAVENOUS | Status: DC

## 2024-01-28 MED ORDER — VERAPAMIL HCL 2.5 MG/ML IV SOLN
INTRAVENOUS | Status: DC | PRN
  Administered 2024-01-28: 2.5 mg via INTRA_ARTERIAL

## 2024-01-28 MED ORDER — HEPARIN SODIUM (PORCINE) 1000 UNIT/ML IJ SOLN
INTRAMUSCULAR | Status: DC | PRN
  Administered 2024-01-28: 3500 [IU] via INTRAVENOUS

## 2024-01-28 MED ORDER — HEPARIN (PORCINE) IN NACL 1000-0.9 UT/500ML-% IV SOLN
INTRAVENOUS | Status: DC | PRN
  Administered 2024-01-28: 1000 mL

## 2024-01-28 MED ORDER — SODIUM CHLORIDE 0.9 % WEIGHT BASED INFUSION
3.0000 mL/kg/h | INTRAVENOUS | Status: AC
  Administered 2024-01-28: 3 mL/kg/h via INTRAVENOUS

## 2024-01-28 MED ORDER — VERAPAMIL HCL 2.5 MG/ML IV SOLN
INTRAVENOUS | Status: AC
  Filled 2024-01-28: qty 2

## 2024-01-28 MED ORDER — ASPIRIN 81 MG PO CHEW
81.0000 mg | CHEWABLE_TABLET | ORAL | Status: AC
  Administered 2024-01-28: 81 mg via ORAL

## 2024-01-28 MED ORDER — SODIUM CHLORIDE 0.9 % WEIGHT BASED INFUSION: 1.0000 mL/kg/h | INTRAVENOUS | Status: DC

## 2024-01-28 MED ORDER — MIDAZOLAM HCL 2 MG/2ML IJ SOLN
INTRAMUSCULAR | Status: DC | PRN
  Administered 2024-01-28: 1 mg via INTRAVENOUS

## 2024-01-28 MED ORDER — IOHEXOL 300 MG/ML  SOLN
INTRAMUSCULAR | Status: DC | PRN
  Administered 2024-01-28: 35 mL

## 2024-01-28 MED ORDER — SODIUM CHLORIDE 0.9% FLUSH
3.0000 mL | Freq: Two times a day (BID) | INTRAVENOUS | Status: DC
  Administered 2024-01-28: 3 mL via INTRAVENOUS

## 2024-01-28 MED ORDER — LIDOCAINE HCL (PF) 1 % IJ SOLN
INTRAMUSCULAR | Status: DC | PRN
  Administered 2024-01-28: 2 mL

## 2024-01-28 MED ORDER — HEPARIN SODIUM (PORCINE) 1000 UNIT/ML IJ SOLN
INTRAMUSCULAR | Status: AC
Start: 2024-01-28 — End: ?
  Filled 2024-01-28: qty 10

## 2024-01-28 MED ORDER — SODIUM CHLORIDE 0.9 % IV SOLN: 250.0000 mL | INTRAVENOUS | Status: DC | PRN

## 2024-01-28 MED ORDER — ASPIRIN 81 MG PO CHEW
CHEWABLE_TABLET | ORAL | Status: AC
  Filled 2024-01-28: qty 1

## 2024-01-28 MED ORDER — FENTANYL CITRATE (PF) 100 MCG/2ML IJ SOLN
INTRAMUSCULAR | Status: DC | PRN
  Administered 2024-01-28: 25 ug via INTRAVENOUS

## 2024-01-28 MED ORDER — SODIUM CHLORIDE 0.9% FLUSH: 3.0000 mL | INTRAVENOUS | Status: DC | PRN

## 2024-01-28 MED ORDER — MIDAZOLAM HCL 2 MG/2ML IJ SOLN
INTRAMUSCULAR | Status: AC
  Filled 2024-01-28: qty 2

## 2024-01-28 MED ORDER — SODIUM CHLORIDE 0.9 % WEIGHT BASED INFUSION
1.0000 mL/kg/h | INTRAVENOUS | Status: DC
  Administered 2024-01-28: 1 mL/kg/h via INTRAVENOUS

## 2024-01-28 SURGICAL SUPPLY — 11 items
CATH 5FR JL3.5 JR4 ANG PIG MP (CATHETERS) IMPLANT
DEVICE RAD TR BAND REGULAR (VASCULAR PRODUCTS) IMPLANT
DRAPE BRACHIAL (DRAPES) IMPLANT
GLIDESHEATH SLEND SS 6F .021 (SHEATH) IMPLANT
GUIDEWIRE INQWIRE 1.5J.035X260 (WIRE) IMPLANT
INQWIRE 1.5J .035X260CM (WIRE) ×1 IMPLANT
PACK CARDIAC CATH (CUSTOM PROCEDURE TRAY) ×1 IMPLANT
PROTECTION STATION PRESSURIZED (MISCELLANEOUS) ×1 IMPLANT
SET ATX-X65L (MISCELLANEOUS) IMPLANT
STATION PROTECTION PRESSURIZED (MISCELLANEOUS) IMPLANT
WIRE HITORQ VERSACORE ST 145CM (WIRE) IMPLANT

## 2024-01-29 ENCOUNTER — Encounter: Payer: Self-pay | Admitting: Internal Medicine

## 2024-02-08 DIAGNOSIS — E7800 Pure hypercholesterolemia, unspecified: Secondary | ICD-10-CM | POA: Diagnosis not present

## 2024-02-08 DIAGNOSIS — I2510 Atherosclerotic heart disease of native coronary artery without angina pectoris: Secondary | ICD-10-CM | POA: Diagnosis not present

## 2024-02-08 DIAGNOSIS — I10 Essential (primary) hypertension: Secondary | ICD-10-CM | POA: Diagnosis not present

## 2024-03-12 ENCOUNTER — Other Ambulatory Visit: Payer: Self-pay | Admitting: Internal Medicine

## 2024-03-16 ENCOUNTER — Ambulatory Visit
Admission: RE | Admit: 2024-03-16 | Discharge: 2024-03-16 | Disposition: A | Discharge status: Home / Self Care | Attending: Internal Medicine | Admitting: Internal Medicine

## 2024-03-16 ENCOUNTER — Ambulatory Visit
Admission: RE | Admit: 2024-03-16 | Discharge: 2024-03-16 | Disposition: A | Discharge status: Home / Self Care | Source: Ambulatory Visit | Attending: Internal Medicine | Admitting: Internal Medicine

## 2024-03-16 ENCOUNTER — Ambulatory Visit (INDEPENDENT_AMBULATORY_CARE_PROVIDER_SITE_OTHER): Admitting: Internal Medicine

## 2024-03-16 ENCOUNTER — Encounter: Payer: Self-pay | Admitting: Internal Medicine

## 2024-03-16 VITALS — BP 120/72 | HR 88 | Temp 98.0°F | Resp 16 | Ht 65.0 in | Wt 161.0 lb

## 2024-03-16 DIAGNOSIS — M549 Dorsalgia, unspecified: Secondary | ICD-10-CM | POA: Diagnosis not present

## 2024-03-16 DIAGNOSIS — M546 Pain in thoracic spine: Secondary | ICD-10-CM

## 2024-03-16 DIAGNOSIS — I10 Essential (primary) hypertension: Secondary | ICD-10-CM | POA: Diagnosis not present

## 2024-03-16 DIAGNOSIS — M8580 Other specified disorders of bone density and structure, unspecified site: Secondary | ICD-10-CM | POA: Diagnosis not present

## 2024-03-16 DIAGNOSIS — M47814 Spondylosis without myelopathy or radiculopathy, thoracic region: Secondary | ICD-10-CM | POA: Diagnosis not present

## 2024-03-16 DIAGNOSIS — J4520 Mild intermittent asthma, uncomplicated: Secondary | ICD-10-CM

## 2024-03-16 MED ORDER — TIZANIDINE HCL 4 MG PO TABS
4.0000 mg | ORAL_TABLET | Freq: Every evening | ORAL | 0 refills | Status: DC | PRN
Start: 2024-03-16 — End: 2024-04-26

## 2024-03-16 NOTE — Assessment & Plan Note (Signed)
 Continue losartan .  Blood pressure as outlined. Follow pressures.

## 2024-03-16 NOTE — Assessment & Plan Note (Addendum)
 Increased mid back pain as outlined. Recent increased cough. Heavy lifting. Exam as outlined. Zanaflex as directed. Tylenol arthritis bid. Check xray. Avoid increased antiinflammatory medication. Intolerance. Discussed possible side effects of MR.

## 2024-03-16 NOTE — Progress Notes (Signed)
 Subjective:    Patient ID: Chloe Moyer, female    DOB: 07-16-1951, 73 y.o.   MRN: [id number]  Patient here for  Chief Complaint  Patient presents with   Back Pain    HPI Here for work in appt - work in for increased back pain - mid back pain. Noticed over this past week. Had increased cough and congestion last week. Some wheezing and sob. Used her nebulizer. Respiratory symptoms have resolved. No cough or congestion. No sob. No chest pain. No abdominal pain or bowel change. Does report persistent mid back pain. No radicular symptoms. No known injury. Did lift a heavy table recently and had increased coughing. Hurts worse when she gets in the car or lying down in bed. Will "catch".    Past Medical History:  Diagnosis Date   Allergic rhinitis    Arthritis    Asthma    Diverticulosis, sigmoid    Family history of breast cancer    Family history of cancer of mouth    Family history of lung cancer    GERD (gastroesophageal reflux disease)    History of hiatal hernia    Hypertension 06/12/2005   Ulcer    Past Surgical History:  Procedure Laterality Date   BREAST BIOPSY Right 09/01/2017   Affirm Bx of 2 areas- both areas were fibroadenomatoid change    BREAST CYST ASPIRATION Left 1990   CARDIAC CATHETERIZATION  July 2012   CHOLECYSTECTOMY  1991   LEFT HEART CATH AND CORONARY ANGIOGRAPHY Left 01/28/2024   Procedure: LEFT HEART CATH AND CORONARY ANGIOGRAPHY;  Surgeon: Antonette Batters, MD;  Location: [facility] INVASIVE CV LAB;  Service: Cardiovascular;  Laterality: Left;   LIPOMA EXCISION  4/99   Left flank area   NASAL SINUS SURGERY  July 2012   TUBAL LIGATION  1984   Family History  Problem Relation Age of Onset   Hypertension Mother    Cancer Mother        oral dx 28   Emphysema Father        smoked   Asthma Father    Lung cancer Father        smoked; dx 66s   Hypertension Father    Cancer - Cervical Paternal Aunt    Cancer Paternal Aunt        stomach v. ovarian    Cancer Paternal Uncle        unk, possibly lung   Breast cancer Maternal Grandmother 60   Stroke Maternal Grandfather    Heart disease Paternal Grandfather        myocardial infarction   Cancer Cousin        unk types   Social History   Socioeconomic History   Marital status: Married    Spouse name: Not on file   Number of children: 2   Years of education: Not on file   Highest education level: Associate degree: academic program  Occupational History   Occupation: Works at [employer]  Tobacco Use   Smoking status: Never   Smokeless tobacco: Never  Substance and Sexual Activity   Alcohol use: Yes    Alcohol/week: 0.0 standard drinks of alcohol    Comment: occ wine with dinner   Drug use: No   Sexual activity: Yes  Other Topics Concern   Not on file  Social History Narrative   Not on file   Social Drivers of Health   Financial Resource Strain: Low Risk  (12/11/2023)  Overall Financial Resource Strain (CARDIA)    Difficulty of Paying Living Expenses: Not hard at all  Food Insecurity: No Food Insecurity (12/11/2023)   Hunger Vital Sign    Worried About Running Out of Food in the Last Year: Never true    Ran Out of Food in the Last Year: Never true  Transportation Needs: No Transportation Needs (12/11/2023)   PRAPARE - Administrator, Civil Service (Medical): No    Lack of Transportation (Non-Medical): No  Physical Activity: Insufficiently Active (12/11/2023)   Exercise Vital Sign    Days of Exercise per Week: 1 day    Minutes of Exercise per Session: 30 min  Stress: No Stress Concern Present (12/11/2023)   [employer] of Occupational Health - Occupational Stress Questionnaire    Feeling of Stress : Only a little  Social Connections: Socially Integrated (12/11/2023)   Social Connection and Isolation Panel [NHANES]    Frequency of Communication with Friends and Family: More than three times a week    Frequency of Social Gatherings with Friends and  Family: Twice a week    Attends Religious Services: More than 4 times per year    Active Member of [employer] or Organizations: Yes    Attends Engineer, structural: More than 4 times per year    Marital Status: Married     Review of Systems  Constitutional:  Negative for appetite change and unexpected weight change.  HENT:  Negative for congestion and sinus pressure.   Respiratory:  Negative for cough, chest tightness and shortness of breath.   Cardiovascular:  Negative for chest pain, palpitations and leg swelling.  Gastrointestinal:  Negative for abdominal pain, diarrhea, nausea and vomiting.  Genitourinary:  Negative for difficulty urinating and dysuria.  Musculoskeletal:  Positive for back pain. Negative for joint swelling and myalgias.  Skin:  Negative for color change and rash.  Neurological:  Negative for dizziness and headaches.  Psychiatric/Behavioral:  Negative for agitation and dysphoric mood.        Objective:     BP 120/72   Pulse 88   Temp 98 F (36.7 C)   Resp 16   Ht 5\' 5"  (1.651 m)   Wt 161 lb (73 kg)   LMP 11/03/1996   SpO2 98%   BMI 26.79 kg/m  Wt Readings from Last 3 Encounters:  03/16/24 161 lb (73 kg)  01/28/24 162 lb 8 oz (73.7 kg)  01/04/24 163 lb (73.9 kg)    Physical Exam Vitals reviewed.  Constitutional:      General: She is not in acute distress.    Appearance: Normal appearance.  HENT:     Head: Normocephalic and atraumatic.     Right Ear: External ear normal.     Left Ear: External ear normal.  Eyes:     General: No scleral icterus.       Right eye: No discharge.        Left eye: No discharge.     Conjunctiva/sclera: Conjunctivae normal.  Neck:     Thyroid : No thyromegaly.  Cardiovascular:     Rate and Rhythm: Normal rate and regular rhythm.  Pulmonary:     Effort: No respiratory distress.     Breath sounds: Normal breath sounds. No wheezing.  Abdominal:     General: Bowel sounds are normal.     Palpations: Abdomen is  soft.     Tenderness: There is no abdominal tenderness.  Musculoskeletal:  General: No swelling.     Cervical back: Neck supple. No tenderness.     Comments: No tenderness to palpation. No pain with walking. No pain with SLR. Increased pain with going from lying to sitting and sitting to lying.   Lymphadenopathy:     Cervical: No cervical adenopathy.  Skin:    Findings: No erythema or rash.  Neurological:     Mental Status: She is alert.  Psychiatric:        Mood and Affect: Mood normal.        Behavior: Behavior normal.         Outpatient Encounter Medications as of 03/16/2024  Medication Sig   tiZANidine (ZANAFLEX) 4 MG tablet Take 1 tablet (4 mg total) by mouth at bedtime as needed for muscle spasms.   albuterol (PROVENTIL HFA;VENTOLIN HFA) 108 (90 BASE) MCG/ACT inhaler Inhale 2 puffs into the lungs every 6 (six) hours as needed for wheezing or shortness of breath.   aspirin  EC 81 MG tablet Take 1 tablet (81 mg total) by mouth daily. (Patient taking differently: Take 81 mg by mouth once a week.)   budesonide -formoterol  (SYMBICORT ) 160-4.5 MCG/ACT inhaler Inhale 2 puffs into the lungs daily. as directed.   cetirizine (ZYRTEC) 10 MG tablet Take 10 mg by mouth daily as needed for allergies.   EPINEPHrine  (EPIPEN  2-PAK) 0.3 mg/0.3 mL IJ SOAJ injection Inject 0.3 mLs (0.3 mg total) into the muscle once.   hydrOXYzine  (ATARAX ) 10 MG tablet Take 1 tablet (10 mg total) by mouth at bedtime as needed.   Hypertonic Nasal Wash (SINUS RINSE NA) Place 1 application  into the nose daily.   ipratropium-albuterol (DUONEB) 0.5-2.5 (3) MG/3ML SOLN Take 3 mLs by nebulization every 6 (six) hours as needed (shortness of breath).   losartan  (COZAAR ) 100 MG tablet TAKE 1 TABLET BY MOUTH ONCE DAILY   nystatin cream (MYCOSTATIN) Apply 1 Application topically daily as needed for dry skin.   omeprazole  (PRILOSEC) 10 MG capsule TAKE 1 CAPSULE BY MOUTH ONCE DAILY   rosuvastatin  (CRESTOR ) 5 MG tablet  TAKE 1 TABLET BY MOUTH TWICE A WEEK AS DIRECTED   Vitamin D, Ergocalciferol, (DRISDOL) 1.25 MG (50000 UNIT) CAPS capsule Take 50,000 Units by mouth once a week.   [DISCONTINUED] gabapentin  (NEURONTIN ) 100 MG capsule Take 1 capsule (100 mg total) by mouth 3 (three) times daily. (Patient not taking: Reported on 01/26/2024)   [DISCONTINUED] nitrofurantoin , macrocrystal-monohydrate, (MACROBID ) 100 MG capsule Take 1 capsule (100 mg total) by mouth 2 (two) times daily. (Patient not taking: Reported on 01/26/2024)   No facility-administered encounter medications on file as of 03/16/2024.     Lab Results  Component Value Date   WBC 3.6 (L) 12/23/2023   HGB 13.2 12/23/2023   HCT 40.5 12/23/2023   PLT 191.0 12/23/2023   GLUCOSE 92 12/23/2023   CHOL 133 12/23/2023   TRIG 94.0 12/23/2023   HDL 57.80 12/23/2023   LDLCALC 56 12/23/2023   ALT 14 12/23/2023   AST 19 12/23/2023   NA 139 12/23/2023   K 4.0 12/23/2023   CL 105 12/23/2023   CREATININE 0.62 12/23/2023   BUN 10 12/23/2023   CO2 28 12/23/2023   TSH 0.20 (L) 12/23/2023   INR 1.1 11/23/2023   HGBA1C 5.8 12/23/2023   MICROALBUR 1.2 03/27/2020    CARDIAC CATHETERIZATION Result Date: 01/28/2024   The left ventricular systolic function is normal.   LV end diastolic pressure is normal.   The left ventricular ejection fraction is 55-65%  by visual estimate.   There is no mitral valve regurgitation.   on 01/28/2024.   No indication for antiplatelet therapy at this time . Conclusion Left heart cath outpatient right radial approach Left ventriculogram showed normal left ventricular function EF 60% Coronaries Left main large free of disease LAD large free of disease Circumflex large free of disease RCA large free of disease Right dominant system No significant obstructive disease Recommend conservative medical therapy Evaluate for noncardiac chest pain Follow-up with cardiologist 1 to 2 weeks No complications Patient tolerated procedure well       Assessment & Plan:  Midline thoracic back pain, unspecified chronicity Assessment & Plan: Increased mid back pain as outlined. Recent increased cough. Heavy lifting. Exam as outlined. Zanaflex as directed. Tylenol arthritis bid. Check xray. Avoid increased antiinflammatory medication. Intolerance. Discussed possible side effects of MR.   Orders: -     DG Thoracic Spine 2 View; Future  Mild intermittent asthma without complication Assessment & Plan: Recent congestion and wheezing. Used nebulizer. Breathing better now. No sob. No cough or congestion. Lungs clear. Follow.    Primary hypertension Assessment & Plan: Continue losartan .  Blood pressure as outlined. Follow pressures.    Other orders -     tiZANidine HCl; Take 1 tablet (4 mg total) by mouth at bedtime as needed for muscle spasms.  Dispense: 20 tablet; Refill: 0     Dellar Fenton, MD

## 2024-03-16 NOTE — Assessment & Plan Note (Signed)
 Recent congestion and wheezing. Used nebulizer. Breathing better now. No sob. No cough or congestion. Lungs clear. Follow.

## 2024-04-22 ENCOUNTER — Other Ambulatory Visit (INDEPENDENT_AMBULATORY_CARE_PROVIDER_SITE_OTHER): Payer: PPO

## 2024-04-22 DIAGNOSIS — I10 Essential (primary) hypertension: Secondary | ICD-10-CM

## 2024-04-22 DIAGNOSIS — E7800 Pure hypercholesterolemia, unspecified: Secondary | ICD-10-CM

## 2024-04-22 DIAGNOSIS — R739 Hyperglycemia, unspecified: Secondary | ICD-10-CM

## 2024-04-22 LAB — HEPATIC FUNCTION PANEL
ALT: 26 U/L (ref 0–35)
AST: 32 U/L (ref 0–37)
Albumin: 4.2 g/dL (ref 3.5–5.2)
Alkaline Phosphatase: 64 U/L (ref 39–117)
Bilirubin, Direct: 0.1 mg/dL (ref 0.0–0.3)
Total Bilirubin: 0.5 mg/dL (ref 0.2–1.2)
Total Protein: 7.6 g/dL (ref 6.0–8.3)

## 2024-04-22 LAB — LIPID PANEL
Cholesterol: 156 mg/dL (ref 0–200)
HDL: 44.9 mg/dL (ref >39.00)
LDL Cholesterol: 92 mg/dL (ref 0–99)
NonHDL: 110.87
Total CHOL/HDL Ratio: 3
Triglycerides: 95 mg/dL (ref 0.0–149.0)
VLDL: 19 mg/dL (ref 0.0–40.0)

## 2024-04-22 LAB — BASIC METABOLIC PANEL WITH GFR
BUN: 10 mg/dL (ref 6–23)
CO2: 25 meq/L (ref 19–32)
Calcium: 10.2 mg/dL (ref 8.4–10.5)
Chloride: 105 meq/L (ref 96–112)
Creatinine, Ser: 0.77 mg/dL (ref 0.40–1.20)
GFR: 75.94 mL/min (ref >60.00)
Glucose, Bld: 101 mg/dL — ABNORMAL HIGH (ref 70–99)
Potassium: 3.9 meq/L (ref 3.5–5.1)
Sodium: 138 meq/L (ref 135–145)

## 2024-04-22 LAB — HEMOGLOBIN A1C: Hgb A1c MFr Bld: 5.7 % (ref 4.6–6.5)

## 2024-04-23 ENCOUNTER — Ambulatory Visit: Payer: Self-pay | Admitting: Internal Medicine

## 2024-04-26 ENCOUNTER — Ambulatory Visit (INDEPENDENT_AMBULATORY_CARE_PROVIDER_SITE_OTHER): Payer: PPO | Admitting: Internal Medicine

## 2024-04-26 VITALS — BP 128/76 | HR 90 | Temp 98.0°F | Resp 16 | Ht 65.0 in | Wt 160.0 lb

## 2024-04-26 DIAGNOSIS — K219 Gastro-esophageal reflux disease without esophagitis: Secondary | ICD-10-CM

## 2024-04-26 DIAGNOSIS — M546 Pain in thoracic spine: Secondary | ICD-10-CM | POA: Diagnosis not present

## 2024-04-26 DIAGNOSIS — E0590 Thyrotoxicosis, unspecified without thyrotoxic crisis or storm: Secondary | ICD-10-CM

## 2024-04-26 DIAGNOSIS — E2839 Other primary ovarian failure: Secondary | ICD-10-CM | POA: Diagnosis not present

## 2024-04-26 DIAGNOSIS — E7800 Pure hypercholesterolemia, unspecified: Secondary | ICD-10-CM | POA: Diagnosis not present

## 2024-04-26 DIAGNOSIS — R739 Hyperglycemia, unspecified: Secondary | ICD-10-CM

## 2024-04-26 DIAGNOSIS — E559 Vitamin D deficiency, unspecified: Secondary | ICD-10-CM

## 2024-04-26 DIAGNOSIS — I10 Essential (primary) hypertension: Secondary | ICD-10-CM

## 2024-04-26 DIAGNOSIS — F439 Reaction to severe stress, unspecified: Secondary | ICD-10-CM

## 2024-04-26 DIAGNOSIS — Z780 Asymptomatic menopausal state: Secondary | ICD-10-CM

## 2024-04-26 DIAGNOSIS — Z860100 Personal history of colon polyps, unspecified: Secondary | ICD-10-CM

## 2024-04-26 DIAGNOSIS — R7989 Other specified abnormal findings of blood chemistry: Secondary | ICD-10-CM

## 2024-04-26 NOTE — Progress Notes (Signed)
 Subjective:    Patient ID: Chloe Moyer, female    DOB: Jul 26, 1951, 73 y.o.   MRN: [id number]  Patient here for  Chief Complaint  Patient presents with   Medical Management of Chronic Issues    HPI Here for a scheduled follow up - follow up regarding asthma, hypertension and hypercholesterolemia. Cath 01/2024 - normal coronaries. Saw cardiology 02/08/24 - no changes. Recently evaluated for back pain. Xray no acute findings. Back doing ok. Is interested in scheduling a bone density. Stays active. No chest pain. Breathing stable. No increased sob. No abdominal pain or bowel issues reported. Request to change her endocrinology care to [facility]. Has been followed at [facility].    Past Medical History:  Diagnosis Date   Allergic rhinitis    Arthritis    Asthma    Diverticulosis, sigmoid    Family history of breast cancer    Family history of cancer of mouth    Family history of lung cancer    GERD (gastroesophageal reflux disease)    History of hiatal hernia    Hypertension 06/12/2005   Ulcer    Past Surgical History:  Procedure Laterality Date   BREAST BIOPSY Right 09/01/2017   Affirm Bx of 2 areas- both areas were fibroadenomatoid change    BREAST CYST ASPIRATION Left 1990   CARDIAC CATHETERIZATION  July 2012   CHOLECYSTECTOMY  1991   LEFT HEART CATH AND CORONARY ANGIOGRAPHY Left 01/28/2024   Procedure: LEFT HEART CATH AND CORONARY ANGIOGRAPHY;  Surgeon: Antonette Batters, MD;  Location: [facility] INVASIVE CV LAB;  Service: Cardiovascular;  Laterality: Left;   LIPOMA EXCISION  4/99   Left flank area   NASAL SINUS SURGERY  July 2012   TUBAL LIGATION  1984   Family History  Problem Relation Age of Onset   Hypertension Mother    Cancer Mother        oral dx 71   Emphysema Father        smoked   Asthma Father    Lung cancer Father        smoked; dx 47s   Hypertension Father    Cancer - Cervical Paternal Aunt    Cancer Paternal Aunt        stomach v. ovarian   Cancer  Paternal Uncle        unk, possibly lung   Breast cancer Maternal Grandmother 60   Stroke Maternal Grandfather    Heart disease Paternal Grandfather        myocardial infarction   Cancer Cousin        unk types   Social History   Socioeconomic History   Marital status: Married    Spouse name: Not on file   Number of children: 2   Years of education: Not on file   Highest education level: Associate degree: academic program  Occupational History   Occupation: Works at [employer]  Tobacco Use   Smoking status: Never   Smokeless tobacco: Never  Substance and Sexual Activity   Alcohol use: Yes    Alcohol/week: 0.0 standard drinks of alcohol    Comment: occ wine with dinner   Drug use: No   Sexual activity: Yes  Other Topics Concern   Not on file  Social History Narrative   Not on file   Social Drivers of Health   Financial Resource Strain: Low Risk  (12/11/2023)   Overall Financial Resource Strain (CARDIA)    Difficulty of Paying Living Expenses: Not  hard at all  Food Insecurity: No Food Insecurity (12/11/2023)   Hunger Vital Sign    Worried About Running Out of Food in the Last Year: Never true    Ran Out of Food in the Last Year: Never true  Transportation Needs: No Transportation Needs (12/11/2023)   PRAPARE - Administrator, Civil Service (Medical): No    Lack of Transportation (Non-Medical): No  Physical Activity: Insufficiently Active (12/11/2023)   Exercise Vital Sign    Days of Exercise per Week: 1 day    Minutes of Exercise per Session: 30 min  Stress: No Stress Concern Present (12/11/2023)   [employer] of Occupational Health - Occupational Stress Questionnaire    Feeling of Stress : Only a little  Social Connections: Socially Integrated (12/11/2023)   Social Connection and Isolation Panel    Frequency of Communication with Friends and Family: More than three times a week    Frequency of Social Gatherings with Friends and Family: Twice a  week    Attends Religious Services: More than 4 times per year    Active Member of [employer] or Organizations: Yes    Attends Engineer, structural: More than 4 times per year    Marital Status: Married     Review of Systems  Constitutional:  Negative for appetite change and unexpected weight change.  HENT:  Negative for congestion and sinus pressure.   Respiratory:  Negative for cough, chest tightness and shortness of breath.   Cardiovascular:  Negative for chest pain, palpitations and leg swelling.  Gastrointestinal:  Negative for abdominal pain, diarrhea, nausea and vomiting.  Genitourinary:  Negative for difficulty urinating and dysuria.  Musculoskeletal:  Negative for joint swelling and myalgias.  Skin:  Negative for color change and rash.  Neurological:  Negative for dizziness and headaches.  Psychiatric/Behavioral:  Negative for agitation and dysphoric mood.        Objective:     BP 128/76   Pulse 90   Temp 98 F (36.7 C)   Resp 16   Ht 5' 5 (1.651 m)   Wt 160 lb (72.6 kg)   LMP 11/03/1996   SpO2 98%   BMI 26.63 kg/m  Wt Readings from Last 3 Encounters:  04/26/24 160 lb (72.6 kg)  03/16/24 161 lb (73 kg)  01/28/24 162 lb 8 oz (73.7 kg)    Physical Exam Vitals reviewed.  Constitutional:      General: She is not in acute distress.    Appearance: Normal appearance.  HENT:     Head: Normocephalic and atraumatic.     Right Ear: External ear normal.     Left Ear: External ear normal.     Mouth/Throat:     Pharynx: No oropharyngeal exudate or posterior oropharyngeal erythema.   Eyes:     General: No scleral icterus.       Right eye: No discharge.        Left eye: No discharge.     Conjunctiva/sclera: Conjunctivae normal.   Neck:     Thyroid : No thyromegaly.   Cardiovascular:     Rate and Rhythm: Normal rate and regular rhythm.  Pulmonary:     Effort: No respiratory distress.     Breath sounds: Normal breath sounds. No wheezing.  Abdominal:      General: Bowel sounds are normal.     Palpations: Abdomen is soft.     Tenderness: There is no abdominal tenderness.   Musculoskeletal:  General: No swelling or tenderness.     Cervical back: Neck supple. No tenderness.  Lymphadenopathy:     Cervical: No cervical adenopathy.   Skin:    Findings: No erythema or rash.   Neurological:     Mental Status: She is alert.   Psychiatric:        Mood and Affect: Mood normal.        Behavior: Behavior normal.         Outpatient Encounter Medications as of 04/26/2024  Medication Sig   albuterol (PROVENTIL HFA;VENTOLIN HFA) 108 (90 BASE) MCG/ACT inhaler Inhale 2 puffs into the lungs every 6 (six) hours as needed for wheezing or shortness of breath.   aspirin  EC 81 MG tablet Take 1 tablet (81 mg total) by mouth daily. (Patient taking differently: Take 81 mg by mouth once a week.)   budesonide -formoterol  (SYMBICORT ) 160-4.5 MCG/ACT inhaler Inhale 2 puffs into the lungs daily. as directed.   cetirizine (ZYRTEC) 10 MG tablet Take 10 mg by mouth daily as needed for allergies.   EPINEPHrine  (EPIPEN  2-PAK) 0.3 mg/0.3 mL IJ SOAJ injection Inject 0.3 mLs (0.3 mg total) into the muscle once.   hydrOXYzine  (ATARAX ) 10 MG tablet Take 1 tablet (10 mg total) by mouth at bedtime as needed.   Hypertonic Nasal Wash (SINUS RINSE NA) Place 1 application  into the nose daily.   ipratropium-albuterol (DUONEB) 0.5-2.5 (3) MG/3ML SOLN Take 3 mLs by nebulization every 6 (six) hours as needed (shortness of breath).   losartan  (COZAAR ) 100 MG tablet TAKE 1 TABLET BY MOUTH ONCE DAILY   nystatin cream (MYCOSTATIN) Apply 1 Application topically daily as needed for dry skin.   omeprazole  (PRILOSEC) 10 MG capsule TAKE 1 CAPSULE BY MOUTH ONCE DAILY   rosuvastatin  (CRESTOR ) 5 MG tablet TAKE 1 TABLET BY MOUTH TWICE A WEEK AS DIRECTED   [DISCONTINUED] tiZANidine  (ZANAFLEX ) 4 MG tablet Take 1 tablet (4 mg total) by mouth at bedtime as needed for muscle spasms.    [DISCONTINUED] Vitamin D, Ergocalciferol, (DRISDOL) 1.25 MG (50000 UNIT) CAPS capsule Take 50,000 Units by mouth once a week.   No facility-administered encounter medications on file as of 04/26/2024.     Lab Results  Component Value Date   WBC 3.6 (L) 12/23/2023   HGB 13.2 12/23/2023   HCT 40.5 12/23/2023   PLT 191.0 12/23/2023   GLUCOSE 101 (H) 04/22/2024   CHOL 156 04/22/2024   TRIG 95.0 04/22/2024   HDL 44.90 04/22/2024   LDLCALC 92 04/22/2024   ALT 26 04/22/2024   AST 32 04/22/2024   NA 138 04/22/2024   K 3.9 04/22/2024   CL 105 04/22/2024   CREATININE 0.77 04/22/2024   BUN 10 04/22/2024   CO2 25 04/22/2024   TSH 0.20 (L) 12/23/2023   INR 1.1 11/23/2023   HGBA1C 5.7 04/22/2024   MICROALBUR 1.2 03/27/2020    DG Thoracic Spine 2 View Result Date: 03/16/2024 CLINICAL DATA:  Increased back pain. EXAM: THORACIC SPINE 2 VIEWS COMPARISON:  None Available. FINDINGS: No acute fracture or subluxation of the thoracic spine. There is osteopenia and degenerative changes. The soft tissues are unremarkable. IMPRESSION: 1. No acute findings. 2. Osteopenia and degenerative changes. Electronically Signed   By: Angus Bark M.D.   On: 03/16/2024 10:25       Assessment & Plan:  Vitamin D deficiency Assessment & Plan: Check vitamin D level today.   Orders: -     VITAMIN D 25 Hydroxy (Vit-D Deficiency, Fractures); Future  Hypercholesterolemia Assessment & Plan: On crestor .  Low cholesterol diet and exercise.  Follow lipid panel and liver function tests. Discussed labs. No changes in medication.  Lab Results  Component Value Date   CHOL 156 04/22/2024   HDL 44.90 04/22/2024   LDLCALC 92 04/22/2024   TRIG 95.0 04/22/2024   CHOLHDL 3 04/22/2024     Orders: -     Hepatic function panel; Future -     Lipid panel; Future  Hyperglycemia Assessment & Plan: Low carb diet and exercise. Follow met b and A1c.  Lab Results  Component Value Date   HGBA1C 5.7 04/22/2024     Orders: -     Hemoglobin A1c; Future  Primary hypertension Assessment & Plan: Continue losartan . Pressure as outlined. Follow metabolic panel. No changes in medication.   Orders: -     Basic metabolic panel with GFR; Future  Estrogen deficiency -     DG Bone Density; Future  Abnormal liver function tests Assessment & Plan:  Evaluated in hepatology clinic [facility]) - reassured her liver function looked good and felt this was a temporary issue - very mild hepatic steatosis and mild fibrosis.  Check liver panel.   Midline thoracic back pain, unspecified chronicity Assessment & Plan: Recent xray - no acute changes. Back doing ok.    Gastroesophageal reflux disease, unspecified whether esophagitis present Assessment & Plan: No upper symptoms reported. Continue prilosec.    History of colonic polyps Assessment & Plan: Colonoscopy 03/28/22 - pathology - tubular adenoma.  No f/u recommended.  (Dr Emerick Hanlon)   Hyperthyroidism Assessment & Plan:  Had f/u with endocrinology 07/09/23. Recommended continuing to monitor TFTs. With osteopenia. Started on tapazole.  Discussed possible liver enzyme increase with tapazole. Tapazole stopped. Continue f/u with endocrinology. Recent tsh (12/2023) .20 with normal free T3 and free T4. Request to have endocrinology care transferred to [facility].    Stress Assessment & Plan: Discussed. Overall appears to be handling things relatively well. Follow.       Dellar Fenton, MD

## 2024-05-01 ENCOUNTER — Encounter: Payer: Self-pay | Admitting: Internal Medicine

## 2024-05-01 NOTE — Assessment & Plan Note (Signed)
 Continue losartan . Pressure as outlined. Follow metabolic panel. No changes in medication.

## 2024-05-01 NOTE — Assessment & Plan Note (Signed)
 Discussed. Overall appears to be handling things relatively well.  Follow.

## 2024-05-01 NOTE — Assessment & Plan Note (Signed)
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  No f/u recommended.  (Dr Mia Creek)

## 2024-05-01 NOTE — Assessment & Plan Note (Signed)
 Low carb diet and exercise. Follow met b and A1c.  Lab Results  Component Value Date   HGBA1C 5.7 04/22/2024

## 2024-05-01 NOTE — Assessment & Plan Note (Signed)
 Check vitamin D level today

## 2024-05-01 NOTE — Assessment & Plan Note (Signed)
 Recent xray - no acute changes. Back doing ok.

## 2024-05-01 NOTE — Assessment & Plan Note (Signed)
 On crestor .  Low cholesterol diet and exercise.  Follow lipid panel and liver function tests. Discussed labs. No changes in medication.  Lab Results  Component Value Date   CHOL 156 04/22/2024   HDL 44.90 04/22/2024   LDLCALC 92 04/22/2024   TRIG 95.0 04/22/2024   CHOLHDL 3 04/22/2024

## 2024-05-01 NOTE — Assessment & Plan Note (Signed)
 Had f/u with endocrinology 07/09/23. Recommended continuing to monitor TFTs. With osteopenia. Started on tapazole.  Discussed possible liver enzyme increase with tapazole. Tapazole stopped. Continue f/u with endocrinology. Recent tsh (12/2023) .20 with normal free T3 and free T4. Request to have endocrinology care transferred to [facility].

## 2024-05-01 NOTE — Assessment & Plan Note (Signed)
No upper symptoms reported.  Continue prilosec.  

## 2024-05-01 NOTE — Assessment & Plan Note (Signed)
 Evaluated in hepatology clinic [facility]) - reassured her liver function looked good and felt this was a temporary issue - very mild hepatic steatosis and mild fibrosis.  Check liver panel.

## 2024-05-04 ENCOUNTER — Telehealth: Payer: Self-pay

## 2024-05-04 NOTE — Telephone Encounter (Signed)
 Copied from CRM [id number]. Topic: General - Other >> May 04, 2024  1:36 PM Trula Gable C wrote: Reason for CRM: Patient called in wanting to speak with Milana Ali, stated she was suppose to have a dexa bone scan, need it to sent to [facility] , wanted to know if Milana Ali could send the referral . Would like a callback when sent

## 2024-05-06 ENCOUNTER — Other Ambulatory Visit: Payer: Self-pay

## 2024-05-06 DIAGNOSIS — Z780 Asymptomatic menopausal state: Secondary | ICD-10-CM

## 2024-05-06 NOTE — Telephone Encounter (Signed)
 Patient is aware that the bone scan is ordered.

## 2024-05-31 ENCOUNTER — Ambulatory Visit: Admitting: *Deleted

## 2024-05-31 VITALS — Ht 65.0 in | Wt 160.0 lb

## 2024-05-31 DIAGNOSIS — Z0000 Encounter for general adult medical examination without abnormal findings: Secondary | ICD-10-CM | POA: Diagnosis not present

## 2024-05-31 NOTE — Patient Instructions (Signed)
 Ms. Shrider , Thank you for taking time out of your busy schedule to complete your Annual Wellness Visit with me. I enjoyed our conversation and look forward to speaking with you again next year. I, as well as your care team,  appreciate your ongoing commitment to your health goals. Please review the following plan we discussed and let me know if I can assist you in the future. Your Game plan/ To Do List    Referrals: If you haven't heard from the office you've been referred to, please reach out to them at the phone provided.  Remember to check on your last Tetanus vaccine. Follow up Visits: Next Medicare AWV with our clinical staff: 06/05/25 @ 2:20   Have you seen your provider in the last 6 months (3 months if uncontrolled diabetes)? Yes Next Office Visit with your provider: 08/31/24  Clinician Recommendations:  Aim for 30 minutes of exercise or brisk walking, 6-8 glasses of water, and 5 servings of fruits and vegetables each day.       This is a list of the screening recommended for you and due dates:  Health Maintenance  Topic Date Due   DTaP/Tdap/Td vaccine (2 - Td or Tdap) 04/29/2023   COVID-19 Vaccine (4 - 2024-25 season) 08/23/2024*   Flu Shot  06/17/2024   Mammogram  10/25/2024   Medicare Annual Wellness Visit  05/31/2025   Colon Cancer Screening  03/28/2027   Pneumococcal Vaccine for age over 50  Completed   DEXA scan (bone density measurement)  Completed   Hepatitis C Screening  Completed   Hepatitis B Vaccine  Aged Out   HPV Vaccine  Aged Out   Meningitis B Vaccine  Aged Out   Zoster (Shingles) Vaccine  Discontinued  *Topic was postponed. The date shown is not the original due date.    Advanced directives: (Copy Requested) Please bring a copy of your health care power of attorney and living will to the office to be added to your chart at your convenience. You can mail to [facility] [address] [place], [place] [address] or email to  [email or web address] Advance Care Planning is important because it:  [x]  Makes sure you receive the medical care that is consistent with your values, goals, and preferences  [x]  It provides guidance to your family and loved ones and reduces their decisional burden about whether or not they are making the right decisions based on your wishes.

## 2024-05-31 NOTE — Progress Notes (Signed)
 Subjective:   Chloe Moyer is a 73 y.o. who presents for a Medicare Wellness preventive visit.  As a reminder, Annual Wellness Visits don't include a physical exam, and some assessments may be limited, especially if this visit is performed virtually. We may recommend an in-person follow-up visit with your provider if needed.  Visit Complete: Virtual I connected with  Bricelyn G Kirkendoll on 05/31/24 by a audio enabled telemedicine application and verified that I am speaking with the correct person using two identifiers.  Patient Location: Home  Provider Location: Home Office  I discussed the limitations of evaluation and management by telemedicine. The patient expressed understanding and agreed to proceed.  Vital Signs: Because this visit was a virtual/telehealth visit, some criteria may be missing or patient reported. Any vitals not documented were not able to be obtained and vitals that have been documented are patient reported.  VideoDeclined- This patient declined Librarian, academic. Therefore the visit was completed with audio only.  Persons Participating in Visit: Patient.  AWV Questionnaire: No: Patient Medicare AWV questionnaire was not completed prior to this visit.  Cardiac Risk Factors include: advanced age (>14men, >67 women);dyslipidemia;hypertension     Objective:    Today's Vitals   05/31/24 1424  Weight: 160 lb (72.6 kg)  Height: 5' 5 (1.651 m)   Body mass index is 26.63 kg/m.     05/31/2024    2:37 PM 12/07/2023    6:23 AM 12/04/2023    3:03 AM 11/23/2023    7:37 AM 09/12/2022    8:48 AM 01/28/2022    8:53 AM 12/26/2020    1:12 PM  Advanced Directives  Does Patient Have a Medical Advance Directive? Yes No No No Yes Yes Yes  Type of Estate agent of [place];Living will    Healthcare Power of [place];Living will Healthcare Power of [place];Living will;Out of facility DNR (pink MOST or yellow form)   Does  patient want to make changes to medical advance directive?     No - Patient declined No - Patient declined No - Patient declined  Copy of Healthcare Power of Attorney in Chart? No - copy requested    No - copy requested    Would patient like information on creating a medical advance directive?   No - Patient declined        Current Medications (verified) Outpatient Encounter Medications as of 05/31/2024  Medication Sig   albuterol (PROVENTIL HFA;VENTOLIN HFA) 108 (90 BASE) MCG/ACT inhaler Inhale 2 puffs into the lungs every 6 (six) hours as needed for wheezing or shortness of breath.   aspirin  EC 81 MG tablet Take 1 tablet (81 mg total) by mouth daily. (Patient taking differently: Take 81 mg by mouth once a week.)   budesonide -formoterol  (SYMBICORT ) 160-4.5 MCG/ACT inhaler Inhale 2 puffs into the lungs daily. as directed.   cetirizine (ZYRTEC) 10 MG tablet Take 10 mg by mouth daily as needed for allergies.   EPINEPHrine  (EPIPEN  2-PAK) 0.3 mg/0.3 mL IJ SOAJ injection Inject 0.3 mLs (0.3 mg total) into the muscle once.   hydrOXYzine  (ATARAX ) 10 MG tablet Take 1 tablet (10 mg total) by mouth at bedtime as needed.   Hypertonic Nasal Wash (SINUS RINSE NA) Place 1 application  into the nose daily.   ipratropium-albuterol (DUONEB) 0.5-2.5 (3) MG/3ML SOLN Take 3 mLs by nebulization every 6 (six) hours as needed (shortness of breath).   losartan  (COZAAR ) 100 MG tablet TAKE 1 TABLET BY MOUTH ONCE DAILY  nystatin cream (MYCOSTATIN) Apply 1 Application topically daily as needed for dry skin.   omeprazole  (PRILOSEC) 10 MG capsule TAKE 1 CAPSULE BY MOUTH ONCE DAILY   rosuvastatin  (CRESTOR ) 5 MG tablet TAKE 1 TABLET BY MOUTH TWICE A WEEK AS DIRECTED (Patient taking differently: Takes two times a week)   No facility-administered encounter medications on file as of 05/31/2024.    Allergies (verified) Sulfamethoxazole-trimethoprim, Lisinopril, Septra [bactrim], Sulfa antibiotics, and Tapazole [methimazole]    History: Past Medical History:  Diagnosis Date   Allergic rhinitis    Arthritis    Asthma    Diverticulosis, sigmoid    Family history of breast cancer    Family history of cancer of mouth    Family history of lung cancer    GERD (gastroesophageal reflux disease)    History of hiatal hernia    Hypertension 06/12/2005   Ulcer    Past Surgical History:  Procedure Laterality Date   BREAST BIOPSY Right 09/01/2017   Affirm Bx of 2 areas- both areas were fibroadenomatoid change    BREAST CYST ASPIRATION Left 1990   CARDIAC CATHETERIZATION  05/2011   CHOLECYSTECTOMY  1991   LEFT HEART CATH AND CORONARY ANGIOGRAPHY Left 01/28/2024   Procedure: LEFT HEART CATH AND CORONARY ANGIOGRAPHY;  Surgeon: Florencio Cara BIRCH, MD;  Location: [facility] INVASIVE CV LAB;  Service: Cardiovascular;  Laterality: Left;   LIPOMA EXCISION  02/1998   Left flank area   LIVER BIOPSY  11/2023   NASAL SINUS SURGERY  05/2011   TUBAL LIGATION  1984   Family History  Problem Relation Age of Onset   Hypertension Mother    Cancer Mother        oral dx 44   Emphysema Father        smoked   Asthma Father    Lung cancer Father        smoked; dx 89s   Hypertension Father    Cancer - Cervical Paternal Aunt    Cancer Paternal Aunt        stomach v. ovarian   Cancer Paternal Uncle        unk, possibly lung   Breast cancer Maternal Grandmother 60   Stroke Maternal Grandfather    Heart disease Paternal Grandfather        myocardial infarction   Cancer Cousin        unk types   Social History   Socioeconomic History   Marital status: Married    Spouse name: Not on file   Number of children: 2   Years of education: Not on file   Highest education level: Associate degree: academic program  Occupational History   Occupation: Works at [employer]  Tobacco Use   Smoking status: Never   Smokeless tobacco: Never  Substance and Sexual Activity   Alcohol use: Yes    Alcohol/week: 0.0 standard drinks of  alcohol    Comment: occ wine with dinner   Drug use: No   Sexual activity: Yes  Other Topics Concern   Not on file  Social History Narrative   married   Social Drivers of Corporate investment banker Strain: Low Risk  (05/31/2024)   Overall Financial Resource Strain (CARDIA)    Difficulty of Paying Living Expenses: Not hard at all  Food Insecurity: No Food Insecurity (05/31/2024)   Hunger Vital Sign    Worried About Running Out of Food in the Last Year: Never true    Ran Out of Food in  the Last Year: Never true  Transportation Needs: No Transportation Needs (05/31/2024)   PRAPARE - Administrator, Civil Service (Medical): No    Lack of Transportation (Non-Medical): No  Physical Activity: Inactive (05/31/2024)   Exercise Vital Sign    Days of Exercise per Week: 0 days    Minutes of Exercise per Session: 0 min  Stress: No Stress Concern Present (05/31/2024)   [employer] of Occupational Health - Occupational Stress Questionnaire    Feeling of Stress: Only a little  Social Connections: Socially Integrated (05/31/2024)   Social Connection and Isolation Panel    Frequency of Communication with Friends and Family: More than three times a week    Frequency of Social Gatherings with Friends and Family: More than three times a week    Attends Religious Services: More than 4 times per year    Active Member of [employer] or Organizations: Yes    Attends Engineer, structural: More than 4 times per year    Marital Status: Married    Tobacco Counseling Counseling given: Not Answered    Clinical Intake:  Pre-visit preparation completed: Yes  Pain : No/denies pain     BMI - recorded: 26.63 Nutritional Status: BMI 25 -29 Overweight Nutritional Risks: None Diabetes: No  Lab Results  Component Value Date   HGBA1C 5.7 04/22/2024   HGBA1C 5.8 12/23/2023   HGBA1C 5.7 08/19/2023     How often do you need to have someone help you when you read instructions,  pamphlets, or other written materials from your doctor or pharmacy?: 1 - Never  Interpreter Needed?: No  Information entered by :: R. Eduarda Scrivens LPN   Activities of Daily Living     05/31/2024    2:25 PM 01/28/2024   10:48 AM  In your present state of health, do you have any difficulty performing the following activities:  Hearing? 0 0  Vision? 0 0  Comment glasses   Difficulty concentrating or making decisions? 0 0  Walking or climbing stairs? 1   Dressing or bathing? 0   Doing errands, shopping? 0   Preparing Food and eating ? N   Using the Toilet? N   In the past six months, have you accidently leaked urine? N   Do you have problems with loss of bowel control? N   Managing your Medications? N   Managing your Finances? N   Housekeeping or managing your Housekeeping? N     Patient Care Team: Glendia Shad, MD as PCP - General (Internal Medicine) Herminio Miu, MD as Referring Physician (Unknown Physician Specialty) Powers, Oneil Faden, MD as Referring Physician (Pulmonary Disease) Senior, Thresa LABOR, MD as Referring Physician (Otolaryngology)  I have updated your Care Teams any recent Medical Services you may have received from other providers in the past year.     Assessment:   This is a routine wellness examination for [place].  Hearing/Vision screen Hearing Screening - Comments:: No issues Vision Screening - Comments:: glasses   Goals Addressed             This Visit's Progress    Patient Stated       Wants to work on hand and arm strength       Depression Screen     05/31/2024    2:32 PM 08/24/2023   12:50 PM 09/12/2022    8:47 AM 08/18/2022   10:29 AM 04/15/2022    1:29 PM 08/26/2021   11:21 AM 10/24/2020  8:25 AM  PHQ 2/9 Scores  PHQ - 2 Score 0 0 0 0 0 0 0  PHQ- 9 Score 0 2         Fall Risk     05/31/2024    2:27 PM 08/24/2023   12:49 PM 12/04/2022    2:53 PM 09/16/2022    2:16 PM 09/12/2022    8:50 AM  Fall Risk   Falls in the past year?  0 0 0 0 0  Number falls in past yr: 0 0 0 0 0  Injury with Fall? 0 0 0 0 0  Risk for fall due to : No Fall Risks No Fall Risks No Fall Risks No Fall Risks No Fall Risks  Follow up Falls evaluation completed;Falls prevention discussed  Falls evaluation completed  Falls evaluation completed  Falls evaluation completed      Data saved with a previous flowsheet row definition    MEDICARE RISK AT HOME:  Medicare Risk at Home Any stairs in or around the home?: Yes If so, are there any without handrails?: No Home free of loose throw rugs in walkways, pet beds, electrical cords, etc?: Yes Adequate lighting in your home to reduce risk of falls?: Yes Life alert?: No Use of a cane, walker or w/c?: No Grab bars in the bathroom?: Yes Shower chair or bench in shower?: No Elevated toilet seat or a handicapped toilet?: Yes  TIMED UP AND GO:  Was the test performed?  No  Cognitive Function: 6CIT completed    07/20/2018    2:24 PM 07/17/2017    2:03 PM 07/15/2016   11:37 AM 03/21/2015   10:37 AM  MMSE - Mini Mental State Exam  Orientation to time 5 5  5  5    Orientation to Place 5 5  5  5    Registration 3 3  3  3    Attention/ Calculation 5 5  5  5    Recall 3 3  3  3    Language- name 2 objects 2 2  2  2    Language- repeat 1 1 1 1   Language- follow 3 step command 3 3  3  3    Language- read & follow direction 1 1  1  1    Write a sentence 1 1  1  1    Copy design 1 1  1  1    Total score 30 30  30  30       Data saved with a previous flowsheet row definition        05/31/2024    2:38 PM 09/12/2022    9:05 AM 07/26/2019    9:25 AM  6CIT Screen  What Year? 0 points 0 points 0 points  What month? 0 points 0 points 0 points  What time? 0 points 0 points 0 points  Count back from 20 0 points 0 points 0 points  Months in reverse 0 points 0 points 0 points  Repeat phrase 0 points 0 points 0 points  Total Score 0 points 0 points 0 points    Immunizations Immunization History  Administered  Date(s) Administered   Fluad Quad(high Dose 65+) 07/26/2019, 07/24/2022   Fluad Trivalent(High Dose 65+) 08/24/2023   Influenza Split 09/27/2012, 09/27/2012   Influenza, High Dose Seasonal PF 07/17/2017, 08/26/2018, 07/31/2020   Influenza,inj,Quad PF,6+ Mos 07/28/2014, 08/21/2015   Influenza-Unspecified 11/28/2011, 08/21/2015, 07/08/2016   PFIZER(Purple Top)SARS-COV-2 Vaccination 12/26/2019, 01/20/2020, 08/27/2020   PNEUMOCOCCAL CONJUGATE-20 07/28/2023   Pneumococcal Conjugate-13 03/22/2015   Pneumococcal  Polysaccharide-23 07/15/2016   Tdap 04/28/2013   Zoster Recombinant(Shingrix) 07/21/2018   Zoster, Live 02/23/2014    Screening Tests Health Maintenance  Topic Date Due   DTaP/Tdap/Td (2 - Td or Tdap) 04/29/2023   Medicare Annual Wellness (AWV)  09/13/2023   COVID-19 Vaccine (4 - 2024-25 season) 08/23/2024 (Originally 07/19/2023)   INFLUENZA VACCINE  06/17/2024   MAMMOGRAM  10/25/2024   Colonoscopy  03/28/2027   Pneumococcal Vaccine: 50+ Years  Completed   DEXA SCAN  Completed   Hepatitis C Screening  Completed   Hepatitis B Vaccines  Aged Out   HPV VACCINES  Aged Out   Meningococcal B Vaccine  Aged Out   Zoster Vaccines- Shingrix  Discontinued    Health Maintenance  Health Maintenance Due  Topic Date Due   DTaP/Tdap/Td (2 - Td or Tdap) 04/29/2023   Medicare Annual Wellness (AWV)  09/13/2023   Health Maintenance Items Addressed: Discussed the need to update Tetanus vaccine. Patient stated that she thinks that she has had one recently and will try to locate the information. Checked [facility] unable to find an updated Tetanus. Patient is working on getting her Dexa scheduled. Unable to take the second shingles vaccines local reaction to arm/  Additional Screening:  Vision Screening: Recommended annual ophthalmology exams for early detection of glaucoma and other disorders of the eye.Up to date  [facility] Would you like a referral to an eye doctor? No    Dental Screening:  Recommended annual dental exams for proper oral hygiene  Community Resource Referral / Chronic Care Management: CRR required this visit?  No   CCM required this visit?  No   Plan:    I have personally reviewed and noted the following in the patient's chart:   Medical and social history Use of alcohol, tobacco or illicit drugs  Current medications and supplements including opioid prescriptions. Patient is not currently taking opioid prescriptions. Functional ability and status Nutritional status Physical activity Advanced directives List of other physicians Hospitalizations, surgeries, and ER visits in previous 12 months Vitals Screenings to include cognitive, depression, and falls Referrals and appointments  In addition, I have reviewed and discussed with patient certain preventive protocols, quality metrics, and best practice recommendations. A written personalized care plan for preventive services as well as general preventive health recommendations were provided to patient.   Angeline Fredericks, LPN   [id number]/84/7974   After Visit Summary: (MyChart) Due to this being a telephonic visit, the after visit summary with patients personalized plan was offered to patient via MyChart   Notes: Nothing significant to report at this time.

## 2024-06-01 ENCOUNTER — Telehealth: Payer: Self-pay

## 2024-06-01 NOTE — Telephone Encounter (Signed)
 Released Dexa scan and faxed to [facility] and received an okay confirmation.

## 2024-06-03 DIAGNOSIS — M85852 Other specified disorders of bone density and structure, left thigh: Secondary | ICD-10-CM | POA: Diagnosis not present

## 2024-06-03 DIAGNOSIS — Z780 Asymptomatic menopausal state: Secondary | ICD-10-CM | POA: Diagnosis not present

## 2024-06-03 LAB — HM DEXA SCAN

## 2024-06-09 DIAGNOSIS — M8589 Other specified disorders of bone density and structure, multiple sites: Secondary | ICD-10-CM | POA: Diagnosis not present

## 2024-06-09 DIAGNOSIS — E0500 Thyrotoxicosis with diffuse goiter without thyrotoxic crisis or storm: Secondary | ICD-10-CM | POA: Diagnosis not present

## 2024-06-09 DIAGNOSIS — R7401 Elevation of levels of liver transaminase levels: Secondary | ICD-10-CM | POA: Diagnosis not present

## 2024-06-10 ENCOUNTER — Encounter: Payer: Self-pay | Admitting: Internal Medicine

## 2024-06-10 DIAGNOSIS — M8580 Other specified disorders of bone density and structure, unspecified site: Secondary | ICD-10-CM | POA: Insufficient documentation

## 2024-06-16 DIAGNOSIS — K7581 Nonalcoholic steatohepatitis (NASH): Secondary | ICD-10-CM | POA: Diagnosis not present

## 2024-06-19 ENCOUNTER — Ambulatory Visit: Payer: Self-pay | Admitting: Internal Medicine

## 2024-06-23 ENCOUNTER — Ambulatory Visit (INDEPENDENT_AMBULATORY_CARE_PROVIDER_SITE_OTHER): Admitting: Internal Medicine

## 2024-06-23 ENCOUNTER — Encounter: Payer: Self-pay | Admitting: Internal Medicine

## 2024-06-23 VITALS — BP 110/64 | HR 86 | Temp 97.8°F | Resp 20 | Ht 65.0 in | Wt 159.5 lb

## 2024-06-23 DIAGNOSIS — I10 Essential (primary) hypertension: Secondary | ICD-10-CM | POA: Diagnosis not present

## 2024-06-23 DIAGNOSIS — F439 Reaction to severe stress, unspecified: Secondary | ICD-10-CM

## 2024-06-23 DIAGNOSIS — R739 Hyperglycemia, unspecified: Secondary | ICD-10-CM | POA: Diagnosis not present

## 2024-06-23 DIAGNOSIS — L280 Lichen simplex chronicus: Secondary | ICD-10-CM | POA: Diagnosis not present

## 2024-06-23 DIAGNOSIS — M8580 Other specified disorders of bone density and structure, unspecified site: Secondary | ICD-10-CM

## 2024-06-23 DIAGNOSIS — L570 Actinic keratosis: Secondary | ICD-10-CM | POA: Diagnosis not present

## 2024-06-23 DIAGNOSIS — I781 Nevus, non-neoplastic: Secondary | ICD-10-CM | POA: Diagnosis not present

## 2024-06-23 DIAGNOSIS — E0590 Thyrotoxicosis, unspecified without thyrotoxic crisis or storm: Secondary | ICD-10-CM | POA: Diagnosis not present

## 2024-06-23 DIAGNOSIS — D485 Neoplasm of uncertain behavior of skin: Secondary | ICD-10-CM | POA: Diagnosis not present

## 2024-06-23 DIAGNOSIS — L820 Inflamed seborrheic keratosis: Secondary | ICD-10-CM | POA: Diagnosis not present

## 2024-06-23 DIAGNOSIS — R7989 Other specified abnormal findings of blood chemistry: Secondary | ICD-10-CM

## 2024-06-23 MED ORDER — HYDROXYZINE HCL 10 MG PO TABS: 10.0000 mg | ORAL_TABLET | Freq: Every evening | ORAL | 0 refills | Status: AC | PRN

## 2024-06-23 NOTE — Progress Notes (Signed)
 Subjective:    Patient ID: Chloe Moyer, female    DOB: 12-10-1950, 73 y.o.   MRN: [id number]  Patient here for  Chief Complaint  Patient presents with   Thyroid  Problem    Wants to talk about endo stuff    HPI Here as a work in appt. Work in to discuss recent endocrinology appt - thyroid  tests and recommendations. Recent evaluation with endocrinology. F/u thyrotoxicosis due to Graves disease. Was previously placed on methimazole. LFTs increased on this medication. Recommended to repeat TFTs and if near normal and asymptomatic - recommended to continue to follow. Recent labs - near normal. Discussed following. She had questions about RAI. Also had questions about her bone density. Discussed her results. Discussed treatment - calcium , vitamin D and weight bearing exercise. Compared to previous bone density. Still with increased stress. Discussed. Overall doing relatively well.    Past Medical History:  Diagnosis Date   Allergic rhinitis    Allergy    Anxiety    Arthritis    Asthma    Cataract not sure optician found them   COPD (chronic obstructive pulmonary disease) (HCC)    Diverticulosis, sigmoid    Family history of breast cancer    Family history of cancer of mouth    Family history of lung cancer    GERD (gastroesophageal reflux disease)    History of hiatal hernia    Hypertension 06/12/2005   Ulcer    Past Surgical History:  Procedure Laterality Date   BREAST BIOPSY Right 09/01/2017   Affirm Bx of 2 areas- both areas were fibroadenomatoid change    BREAST CYST ASPIRATION Left 1990   CARDIAC CATHETERIZATION  05/2011   CHOLECYSTECTOMY  1991   LEFT HEART CATH AND CORONARY ANGIOGRAPHY Left 01/28/2024   Procedure: LEFT HEART CATH AND CORONARY ANGIOGRAPHY;  Surgeon: Florencio Cara BIRCH, MD;  Location: [facility] INVASIVE CV [facility];  Service: Cardiovascular;  Laterality: Left;   LIPOMA EXCISION  02/1998   Left flank area   LIVER BIOPSY  11/2023   NASAL SINUS SURGERY  05/2011    TUBAL LIGATION  1984   Family History  Problem Relation Age of Onset   Hypertension Mother    Cancer Mother        oral dx 47   Hearing loss Mother    Emphysema Father        smoked   Asthma Father    Lung cancer Father        smoked; dx 19s   Hypertension Father    Arthritis Father    Cancer Father    COPD Father    Cancer Maternal Uncle    Cancer - Cervical Paternal Aunt    Cancer Paternal Aunt        stomach v. 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  Tobacco Use  Smoking status: Never   Smokeless tobacco: Never  Substance and Sexual Activity   Alcohol use: Yes    Alcohol/week: 0.0 standard drinks of alcohol    Comment: occ wine with dinner   Drug use: No   Sexual activity: Yes  Other Topics Concern   Not on file  Social History Narrative   married   Social Drivers of Corporate investment banker Strain: Low Risk  (05/31/2024)   Overall Financial Resource Strain (CARDIA)    Difficulty of Paying Living Expenses: Not hard at all  Food Insecurity: No Food Insecurity (05/31/2024)   Hunger Vital Sign    Worried About Running Out of Food in the Last Year: Never true    Ran Out of Food in the Last Year: Never true  Transportation Needs: No Transportation Needs (05/31/2024)   PRAPARE - Administrator, Civil Service (Medical): No    Lack of Transportation (Non-Medical): No  Physical Activity: Inactive (05/31/2024)   Exercise Vital Sign    Days of  Exercise per Week: 0 days    Minutes of Exercise per Session: 0 min  Stress: No Stress Concern Present (05/31/2024)   [employer] of Occupational Health - Occupational Stress Questionnaire    Feeling of Stress: Only a little  Social Connections: Socially Integrated (05/31/2024)   Social Connection and Isolation Panel    Frequency of Communication with Friends and Family: More than three times a week    Frequency of Social Gatherings with Friends and Family: More than three times a week    Attends Religious Services: More than 4 times per year    Active Member of [employer] or Organizations: Yes    Attends Engineer, structural: More than 4 times per year    Marital Status: Married     Review of Systems  Constitutional:  Negative for appetite change and unexpected weight change.  HENT:  Negative for congestion and sinus pressure.   Respiratory:  Negative for cough, chest tightness and shortness of breath.   Cardiovascular:  Negative for chest pain, palpitations and leg swelling.  Gastrointestinal:  Negative for abdominal pain, diarrhea, nausea and vomiting.  Genitourinary:  Negative for difficulty urinating and dysuria.  Musculoskeletal:  Negative for joint swelling and myalgias.  Skin:  Negative for color change and rash.  Neurological:  Negative for dizziness and headaches.  Psychiatric/Behavioral:  Negative for agitation and dysphoric mood.        Objective:     BP 110/64   Pulse 86   Temp 97.8 F (36.6 C)   Resp 20   Ht 5' 5 (1.651 m)   Wt 159 lb 8 oz (72.3 kg)   LMP 11/03/1996   SpO2 98%   BMI 26.54 kg/m  Wt Readings from Last 3 Encounters:  06/23/24 159 lb 8 oz (72.3 kg)  05/31/24 160 lb (72.6 kg)  04/26/24 160 lb (72.6 kg)    Physical Exam Vitals reviewed.  Constitutional:      General: She is not in acute distress.    Appearance: Normal appearance.  HENT:     Head: Normocephalic and atraumatic.     Right Ear: External ear normal.     Left Ear:  External ear normal.     Mouth/Throat:     Pharynx: No oropharyngeal exudate or posterior oropharyngeal erythema.  Eyes:     General: No scleral icterus.       Right eye: No discharge.  Left eye: No discharge.     Conjunctiva/sclera: Conjunctivae normal.  Neck:     Thyroid : No thyromegaly.  Cardiovascular:     Rate and Rhythm: Normal rate and regular rhythm.  Pulmonary:     Effort: No respiratory distress.     Breath sounds: Normal breath sounds. No wheezing.  Abdominal:     General: Bowel sounds are normal.     Palpations: Abdomen is soft.     Tenderness: There is no abdominal tenderness.  Musculoskeletal:        General: No swelling or tenderness.     Cervical back: Neck supple. No tenderness.  Lymphadenopathy:     Cervical: No cervical adenopathy.  Skin:    Findings: No erythema or rash.  Neurological:     Mental Status: She is alert.  Psychiatric:        Mood and Affect: Mood normal.        Behavior: Behavior normal.         Outpatient Encounter Medications as of 06/23/2024  Medication Sig   albuterol (PROVENTIL HFA;VENTOLIN HFA) 108 (90 BASE) MCG/ACT inhaler Inhale 2 puffs into the lungs every 6 (six) hours as needed for wheezing or shortness of breath.   aspirin  EC 81 MG tablet Take 1 tablet (81 mg total) by mouth daily. (Patient taking differently: Take 81 mg by mouth once a week.)   budesonide -formoterol  (SYMBICORT ) 160-4.5 MCG/ACT inhaler Inhale 2 puffs into the lungs daily. as directed.   cetirizine (ZYRTEC) 10 MG tablet Take 10 mg by mouth daily as needed for allergies.   EPINEPHrine  (EPIPEN  2-PAK) 0.3 mg/0.3 mL IJ SOAJ injection Inject 0.3 mLs (0.3 mg total) into the muscle once.   Hypertonic Nasal Wash (SINUS RINSE NA) Place 1 application  into the nose daily.   ipratropium-albuterol (DUONEB) 0.5-2.5 (3) MG/3ML SOLN Take 3 mLs by nebulization every 6 (six) hours as needed (shortness of breath).   losartan  (COZAAR ) 100 MG tablet TAKE 1 TABLET BY MOUTH  ONCE DAILY   nystatin cream (MYCOSTATIN) Apply 1 Application topically daily as needed for dry skin.   omeprazole  (PRILOSEC) 10 MG capsule TAKE 1 CAPSULE BY MOUTH ONCE DAILY   rosuvastatin  (CRESTOR ) 5 MG tablet TAKE 1 TABLET BY MOUTH TWICE A WEEK AS DIRECTED (Patient taking differently: Takes two times a week)   [DISCONTINUED] hydrOXYzine  (ATARAX ) 10 MG tablet Take 1 tablet (10 mg total) by mouth at bedtime as needed.   hydrOXYzine  (ATARAX ) 10 MG tablet Take 1 tablet (10 mg total) by mouth at bedtime as needed.   No facility-administered encounter medications on file as of 06/23/2024.     Lab Results  Component Value Date   WBC 3.6 (L) 12/23/2023   HGB 13.2 12/23/2023   HCT 40.5 12/23/2023   PLT 191.0 12/23/2023   GLUCOSE 101 (H) 04/22/2024   CHOL 156 04/22/2024   TRIG 95.0 04/22/2024   HDL 44.90 04/22/2024   LDLCALC 92 04/22/2024   ALT 26 04/22/2024   AST 32 04/22/2024   NA 138 04/22/2024   K 3.9 04/22/2024   CL 105 04/22/2024   CREATININE 0.77 04/22/2024   BUN 10 04/22/2024   CO2 25 04/22/2024   TSH 0.20 (L) 12/23/2023   INR 1.1 11/23/2023   HGBA1C 5.7 04/22/2024    DG Thoracic Spine 2 View Result Date: 03/16/2024 CLINICAL DATA:  Increased back pain. EXAM: THORACIC SPINE 2 VIEWS COMPARISON:  None Available. FINDINGS: No acute fracture or subluxation of the thoracic spine. There is osteopenia and  degenerative changes. The soft tissues are unremarkable. IMPRESSION: 1. No acute findings. 2. Osteopenia and degenerative changes. Electronically Signed   By: Vanetta Chou M.D.   On: 03/16/2024 10:25       Assessment & Plan:  Abnormal liver function tests Assessment & Plan:  Evaluated in hepatology clinic [facility]) - reassured her liver function looked good and felt this was a temporary issue - very mild hepatic steatosis and mild fibrosis. Recent check wnl.    Stress Assessment & Plan: Increased stress. Discussed. Overall appears to be handling things relatively well. Follow.     Osteopenia, unspecified location Assessment & Plan: Recent bone density 06/09/24 - low bone mass. Discussed results with her. Discussed osteopenia. Discussed treatment options and recommendations for calcium , vitamin D and weight bearing exercise. We will follow.    Primary hypertension Assessment & Plan: Continue losartan . Pressure as outlined. Follow metabolic panel. No changes in medication.    Hyperthyroidism Assessment & Plan: Recent follow up with endocrinology as outlined. Recent TFTs - near normal. Recommended to continue to follow.    Hyperglycemia Assessment & Plan: Low carb diet and exercise. Follow met b and A1c.  Lab Results  Component Value Date   HGBA1C 5.7 04/22/2024     Other orders -     hydrOXYzine  HCl; Take 1 tablet (10 mg total) by mouth at bedtime as needed.  Dispense: 30 tablet; Refill: 0     Allena Hamilton, MD

## 2024-06-26 ENCOUNTER — Encounter: Payer: Self-pay | Admitting: Internal Medicine

## 2024-06-26 NOTE — Assessment & Plan Note (Signed)
Increased stress.  Discussed.  Overall appears to be handling things relatively well.  Follow.  

## 2024-06-26 NOTE — Assessment & Plan Note (Signed)
 Evaluated in hepatology clinic [facility]) - reassured her liver function looked good and felt this was a temporary issue - very mild hepatic steatosis and mild fibrosis. Recent check wnl.

## 2024-06-26 NOTE — Assessment & Plan Note (Signed)
 Continue losartan . Pressure as outlined. Follow metabolic panel. No changes in medication.

## 2024-06-26 NOTE — Assessment & Plan Note (Signed)
 Recent follow up with endocrinology as outlined. Recent TFTs - near normal. Recommended to continue to follow.

## 2024-06-26 NOTE — Assessment & Plan Note (Signed)
 Low carb diet and exercise. Follow met b and A1c.  Lab Results  Component Value Date   HGBA1C 5.7 04/22/2024

## 2024-06-26 NOTE — Assessment & Plan Note (Signed)
 Recent bone density 06/09/24 - low bone mass. Discussed results with her. Discussed osteopenia. Discussed treatment options and recommendations for calcium , vitamin D and weight bearing exercise. We will follow.

## 2024-07-26 DIAGNOSIS — J4540 Moderate persistent asthma, uncomplicated: Secondary | ICD-10-CM | POA: Diagnosis not present

## 2024-07-27 DIAGNOSIS — H04123 Dry eye syndrome of bilateral lacrimal glands: Secondary | ICD-10-CM | POA: Diagnosis not present

## 2024-07-27 DIAGNOSIS — H43813 Vitreous degeneration, bilateral: Secondary | ICD-10-CM | POA: Diagnosis not present

## 2024-07-27 DIAGNOSIS — H2513 Age-related nuclear cataract, bilateral: Secondary | ICD-10-CM | POA: Diagnosis not present

## 2024-08-01 ENCOUNTER — Encounter: Payer: Self-pay | Admitting: Pharmacist

## 2024-08-01 NOTE — Progress Notes (Signed)
 Pharmacy Quality Measure Review  This patient is appearing on a report for being at risk of failing the adherence measure for cholesterol (statin) medications this calendar year.   Medication: rosuvastatin  5 mg Last fill date: 03/12/24 for 90 day supply  Contacted pharmacy to confirm fill dates. Fill date above is accurate. Pharmacist confirms multiple refills remaining.   MyChart message sent to patient.

## 2024-08-12 ENCOUNTER — Encounter: Payer: Self-pay | Admitting: Pharmacist

## 2024-08-29 ENCOUNTER — Other Ambulatory Visit (INDEPENDENT_AMBULATORY_CARE_PROVIDER_SITE_OTHER)

## 2024-08-29 DIAGNOSIS — E559 Vitamin D deficiency, unspecified: Secondary | ICD-10-CM

## 2024-08-29 DIAGNOSIS — E7800 Pure hypercholesterolemia, unspecified: Secondary | ICD-10-CM

## 2024-08-29 DIAGNOSIS — I10 Essential (primary) hypertension: Secondary | ICD-10-CM

## 2024-08-29 DIAGNOSIS — R739 Hyperglycemia, unspecified: Secondary | ICD-10-CM

## 2024-08-29 LAB — BASIC METABOLIC PANEL WITH GFR
BUN: 12 mg/dL (ref 6–23)
CO2: 23 meq/L (ref 19–32)
Calcium: 9.6 mg/dL (ref 8.4–10.5)
Chloride: 108 meq/L (ref 96–112)
Creatinine, Ser: 0.77 mg/dL (ref 0.40–1.20)
GFR: 75.75 mL/min (ref >60.00)
Glucose, Bld: 108 mg/dL — ABNORMAL HIGH (ref 70–99)
Potassium: 4 meq/L (ref 3.5–5.1)
Sodium: 138 meq/L (ref 135–145)

## 2024-08-29 LAB — LIPID PANEL
Cholesterol: 132 mg/dL (ref 0–200)
HDL: 44.1 mg/dL (ref >39.00)
LDL Cholesterol: 74 mg/dL (ref 0–99)
NonHDL: 88.04
Total CHOL/HDL Ratio: 3
Triglycerides: 71 mg/dL (ref 0.0–149.0)
VLDL: 14.2 mg/dL (ref 0.0–40.0)

## 2024-08-29 LAB — HEPATIC FUNCTION PANEL
ALT: 18 U/L (ref 0–35)
AST: 22 U/L (ref 0–37)
Albumin: 4.2 g/dL (ref 3.5–5.2)
Alkaline Phosphatase: 54 U/L (ref 39–117)
Bilirubin, Direct: 0.1 mg/dL (ref 0.0–0.3)
Total Bilirubin: 0.4 mg/dL (ref 0.2–1.2)
Total Protein: 7.2 g/dL (ref 6.0–8.3)

## 2024-08-29 LAB — VITAMIN D 25 HYDROXY (VIT D DEFICIENCY, FRACTURES): VITD: 32.57 ng/mL (ref 30.00–100.00)

## 2024-08-29 LAB — HEMOGLOBIN A1C: Hgb A1c MFr Bld: 6 % (ref 4.6–6.5)

## 2024-08-30 ENCOUNTER — Ambulatory Visit: Payer: Self-pay | Admitting: Internal Medicine

## 2024-08-31 ENCOUNTER — Encounter: Payer: Self-pay | Admitting: Internal Medicine

## 2024-08-31 ENCOUNTER — Ambulatory Visit: Admitting: Internal Medicine

## 2024-08-31 VITALS — BP 110/72 | HR 89 | Temp 97.8°F | Ht 65.0 in | Wt 158.0 lb

## 2024-08-31 DIAGNOSIS — E7800 Pure hypercholesterolemia, unspecified: Secondary | ICD-10-CM | POA: Diagnosis not present

## 2024-08-31 DIAGNOSIS — R739 Hyperglycemia, unspecified: Secondary | ICD-10-CM | POA: Diagnosis not present

## 2024-08-31 DIAGNOSIS — E0590 Thyrotoxicosis, unspecified without thyrotoxic crisis or storm: Secondary | ICD-10-CM | POA: Diagnosis not present

## 2024-08-31 DIAGNOSIS — Z23 Encounter for immunization: Secondary | ICD-10-CM

## 2024-08-31 DIAGNOSIS — Z860100 Personal history of colon polyps, unspecified: Secondary | ICD-10-CM

## 2024-08-31 DIAGNOSIS — I10 Essential (primary) hypertension: Secondary | ICD-10-CM | POA: Diagnosis not present

## 2024-08-31 DIAGNOSIS — F439 Reaction to severe stress, unspecified: Secondary | ICD-10-CM

## 2024-08-31 DIAGNOSIS — J4520 Mild intermittent asthma, uncomplicated: Secondary | ICD-10-CM | POA: Diagnosis not present

## 2024-08-31 DIAGNOSIS — K219 Gastro-esophageal reflux disease without esophagitis: Secondary | ICD-10-CM | POA: Diagnosis not present

## 2024-08-31 DIAGNOSIS — R7989 Other specified abnormal findings of blood chemistry: Secondary | ICD-10-CM

## 2024-08-31 NOTE — Progress Notes (Signed)
 Subjective:    Patient ID: Chloe Moyer, female    DOB: Mar 02, 1951, 73 y.o.   MRN: [id number]  Patient here for  Chief Complaint  Patient presents with   Stress    HPI Here for a scheduled follow up - follow up regarding increased stress, hypertension, hypothyroidism and abnormal liver function tests.  Had f/u with  pulmonary 07/26/24 - f/u regarding moderate to severe persistent allergic asthma. Continues on symbicort  (increase 160 two puffs bid). Albuterol inhaler if needed. Continue asteline. Breathing doing well on current regimen. No chest pain. No increased cough or congestion. No abdominal pain or bowel change reported. Increased stress - with husband's health issues. Overall she feels she is doing well.    Past Medical History:  Diagnosis Date   Allergic rhinitis    Allergy    Anxiety    Arthritis    Asthma    Cataract not sure optician found them   COPD (chronic obstructive pulmonary disease) (HCC)    Diverticulosis, sigmoid    Family history of breast cancer    Family history of cancer of mouth    Family history of lung cancer    GERD (gastroesophageal reflux disease)    History of hiatal hernia    Hypertension 06/12/2005   Ulcer    Past Surgical History:  Procedure Laterality Date   BREAST BIOPSY Right 09/01/2017   Affirm Bx of 2 areas- both areas were fibroadenomatoid change    BREAST CYST ASPIRATION Left 1990   CARDIAC CATHETERIZATION  05/2011   CHOLECYSTECTOMY  1991   LEFT HEART CATH AND CORONARY ANGIOGRAPHY Left 01/28/2024   Procedure: LEFT HEART CATH AND CORONARY ANGIOGRAPHY;  Surgeon: Florencio Cara BIRCH, MD;  Location: [facility] INVASIVE CV LAB;  Service: Cardiovascular;  Laterality: Left;   LIPOMA EXCISION  02/1998   Left flank area   LIVER BIOPSY  11/2023   NASAL SINUS SURGERY  05/2011   TUBAL LIGATION  1984   Family History  Problem Relation Age of Onset   Hypertension Mother    Cancer Mother        oral dx 69   Hearing loss Mother    Emphysema  Father        smoked   Asthma Father    Lung cancer Father        smoked; dx 4s   Hypertension Father    Arthritis Father    Cancer Father    COPD Father    Cancer Maternal Uncle    Cancer - Cervical Paternal Aunt    Cancer Paternal Aunt        stomach v. 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  Tobacco Use   Smoking status: Never   Smokeless tobacco: Never  Substance and Sexual Activity   Alcohol use: Yes    Alcohol/week: 0.0 standard drinks  of alcohol    Comment: occ wine with dinner   Drug use: No   Sexual activity: Yes  Other Topics Concern   Not on file  Social History Narrative   married   Social Drivers of Corporate investment banker Strain: Low Risk  (08/29/2024)   Overall Financial Resource Strain (CARDIA)    Difficulty of Paying Living Expenses: Not very hard  Food Insecurity: No Food Insecurity (08/29/2024)   Hunger Vital Sign    Worried About Running Out of Food in the Last Year: Never true    Ran Out of Food in the Last Year: Never true  Transportation Needs: No Transportation Needs (08/29/2024)   PRAPARE - Administrator, Civil Service (Medical): No    Lack of Transportation (Non-Medical): No  Physical Activity: Insufficiently Active (08/29/2024)   Exercise Vital Sign    Days of Exercise per Week: 2 days    Minutes of Exercise per Session: 20 min  Stress: No Stress Concern Present (08/29/2024)   [employer] of Occupational Health  - Occupational Stress Questionnaire    Feeling of Stress: Only a little  Social Connections: Socially Integrated (08/29/2024)   Social Connection and Isolation Panel    Frequency of Communication with Friends and Family: More than three times a week    Frequency of Social Gatherings with Friends and Family: Three times a week    Attends Religious Services: More than 4 times per year    Active Member of Clubs or Organizations: Yes    Attends Banker Meetings: More than 4 times per year    Marital Status: Married     Review of Systems  Constitutional:  Negative for appetite change and unexpected weight change.  HENT:  Negative for congestion and sinus pressure.   Respiratory:  Negative for cough, chest tightness and shortness of breath.   Cardiovascular:  Negative for chest pain, palpitations and leg swelling.  Gastrointestinal:  Negative for abdominal pain, diarrhea, nausea and vomiting.  Genitourinary:  Negative for difficulty urinating and dysuria.  Musculoskeletal:  Negative for myalgias.  Skin:  Negative for color change and rash.  Neurological:  Negative for dizziness and headaches.  Psychiatric/Behavioral:  Negative for agitation and dysphoric mood.        Objective:     BP 110/72 (BP Location: Left Arm, Patient Position: Sitting, Cuff Size: Normal)   Pulse 89   Temp 97.8 F (36.6 C) (Oral)   Ht 5' 5 (1.651 m)   Wt 158 lb (71.7 kg)   LMP 11/03/1996   SpO2 97%   BMI 26.29 kg/m  Wt Readings from Last 3 Encounters:  08/31/24 158 lb (71.7 kg)  06/23/24 159 lb 8 oz (72.3 kg)  05/31/24 160 lb (72.6 kg)    Physical Exam Vitals reviewed.  Constitutional:      General: She is not in acute distress.    Appearance: Normal appearance.  HENT:     Head: Normocephalic and atraumatic.     Right Ear: External ear normal.     Left Ear: External ear normal.     Mouth/Throat:     Pharynx: No oropharyngeal exudate or posterior oropharyngeal erythema.  Eyes:      General: No scleral icterus.       Right eye: No discharge.        Left eye: No discharge.     Conjunctiva/sclera: Conjunctivae normal.  Neck:     Thyroid : No thyromegaly.  Cardiovascular:  Rate and Rhythm: Normal rate and regular rhythm.  Pulmonary:     Effort: No respiratory distress.     Breath sounds: Normal breath sounds. No wheezing.  Abdominal:     General: Bowel sounds are normal.     Palpations: Abdomen is soft.     Tenderness: There is no abdominal tenderness.  Musculoskeletal:        General: No swelling or tenderness.     Cervical back: Neck supple. No tenderness.  Lymphadenopathy:     Cervical: No cervical adenopathy.  Skin:    Findings: No erythema or rash.  Neurological:     Mental Status: She is alert.  Psychiatric:        Mood and Affect: Mood normal.        Behavior: Behavior normal.         Outpatient Encounter Medications as of 08/31/2024  Medication Sig   albuterol (PROVENTIL HFA;VENTOLIN HFA) 108 (90 BASE) MCG/ACT inhaler Inhale 2 puffs into the lungs every 6 (six) hours as needed for wheezing or shortness of breath.   aspirin  EC 81 MG tablet Take 1 tablet (81 mg total) by mouth daily. (Patient taking differently: Take 81 mg by mouth once a week.)   budesonide -formoterol  (SYMBICORT ) 160-4.5 MCG/ACT inhaler Inhale 2 puffs into the lungs daily. as directed.   cetirizine (ZYRTEC) 10 MG tablet Take 10 mg by mouth daily as needed for allergies.   EPINEPHrine  (EPIPEN  2-PAK) 0.3 mg/0.3 mL IJ SOAJ injection Inject 0.3 mLs (0.3 mg total) into the muscle once.   hydrOXYzine  (ATARAX ) 10 MG tablet Take 1 tablet (10 mg total) by mouth at bedtime as needed.   Hypertonic Nasal Wash (SINUS RINSE NA) Place 1 application  into the nose daily.   ipratropium-albuterol (DUONEB) 0.5-2.5 (3) MG/3ML SOLN Take 3 mLs by nebulization every 6 (six) hours as needed (shortness of breath).   losartan  (COZAAR ) 100 MG tablet TAKE 1 TABLET BY MOUTH ONCE DAILY   nystatin cream  (MYCOSTATIN) Apply 1 Application topically daily as needed for dry skin.   omeprazole  (PRILOSEC) 10 MG capsule TAKE 1 CAPSULE BY MOUTH ONCE DAILY   rosuvastatin  (CRESTOR ) 5 MG tablet TAKE 1 TABLET BY MOUTH TWICE A WEEK AS DIRECTED (Patient taking differently: Takes two times a week)   No facility-administered encounter medications on file as of 08/31/2024.     Lab Results  Component Value Date   WBC 3.6 (L) 12/23/2023   HGB 13.2 12/23/2023   HCT 40.5 12/23/2023   PLT 191.0 12/23/2023   GLUCOSE 108 (H) 08/29/2024   CHOL 132 08/29/2024   TRIG 71.0 08/29/2024   HDL 44.10 08/29/2024   LDLCALC 74 08/29/2024   ALT 18 08/29/2024   AST 22 08/29/2024   NA 138 08/29/2024   K 4.0 08/29/2024   CL 108 08/29/2024   CREATININE 0.77 08/29/2024   BUN 12 08/29/2024   CO2 23 08/29/2024   TSH 0.20 (L) 12/23/2023   INR 1.1 11/23/2023   HGBA1C 6.0 08/29/2024    DG Thoracic Spine 2 View Result Date: 03/16/2024 CLINICAL DATA:  Increased back pain. EXAM: THORACIC SPINE 2 VIEWS COMPARISON:  None Available. FINDINGS: No acute fracture or subluxation of the thoracic spine. There is osteopenia and degenerative changes. The soft tissues are unremarkable. IMPRESSION: 1. No acute findings. 2. Osteopenia and degenerative changes. Electronically Signed   By: Vanetta Chou M.D.   On: 03/16/2024 10:25       Assessment & Plan:  Needs flu shot -  Flu vaccine HIGH DOSE PF(Fluzone Trivalent)  Hypercholesterolemia Assessment & Plan: On crestor .  Low cholesterol diet and exercise.  Follow lipid panel and liver function tests. No changes in medication today.  Lab Results  Component Value Date   CHOL 132 08/29/2024   HDL 44.10 08/29/2024   LDLCALC 74 08/29/2024   TRIG 71.0 08/29/2024   CHOLHDL 3 08/29/2024     Orders: -     Hepatic function panel; Future -     Lipid panel; Future  Hyperglycemia Assessment & Plan: Low carb diet and exercise. Follow met b and A1c  Lab Results  Component Value  Date   HGBA1C 6.0 08/29/2024    Orders: -     Hemoglobin A1c; Future  Primary hypertension Assessment & Plan: Continue losartan . Blood pressure as outlined. Continue to spot check pressures. Follow metabolic panel.   Orders: -     Basic metabolic panel with GFR; Future  Abnormal liver function tests Assessment & Plan: Liver function tests wnl 08/29/24.    Mild intermittent asthma without complication Assessment & Plan: Breathing stable. Doing well. Continues on symbicort .    Gastroesophageal reflux disease, unspecified whether esophagitis present Assessment & Plan: On prilosec. Controlling upper symptoms.    History of colonic polyps Assessment & Plan: Colonoscopy 03/28/22 - pathology - tubular adenoma.  No f/u recommended.  (Dr Maryruth)   Hyperthyroidism Assessment & Plan: Being followed by endocrinology.    Stress Assessment & Plan: Increased stress as outlined. Overall she feels she is doing relatively well. Does not feel needs further intervention. Follow.       Allena Hamilton, MD

## 2024-09-03 ENCOUNTER — Other Ambulatory Visit: Payer: Self-pay | Admitting: Internal Medicine

## 2024-09-03 DIAGNOSIS — E0590 Thyrotoxicosis, unspecified without thyrotoxic crisis or storm: Secondary | ICD-10-CM

## 2024-09-03 DIAGNOSIS — D72819 Decreased white blood cell count, unspecified: Secondary | ICD-10-CM

## 2024-09-03 NOTE — Progress Notes (Signed)
 Order placed for future labs

## 2024-09-05 ENCOUNTER — Encounter: Payer: Self-pay | Admitting: Internal Medicine

## 2024-09-05 NOTE — Assessment & Plan Note (Signed)
 Increased stress as outlined. Overall she feels she is doing relatively well. Does not feel needs further intervention. Follow.

## 2024-09-05 NOTE — Assessment & Plan Note (Signed)
 Liver function tests wnl 08/29/24.

## 2024-09-05 NOTE — Assessment & Plan Note (Signed)
 Low carb diet and exercise. Follow met b and A1c  Lab Results  Component Value Date   HGBA1C 6.0 08/29/2024

## 2024-09-05 NOTE — Assessment & Plan Note (Signed)
 On prilosec. Controlling upper symptoms.

## 2024-09-05 NOTE — Assessment & Plan Note (Signed)
 Continue losartan . Blood pressure as outlined. Continue to spot check pressures. Follow metabolic panel.

## 2024-09-05 NOTE — Assessment & Plan Note (Signed)
 On crestor .  Low cholesterol diet and exercise.  Follow lipid panel and liver function tests. No changes in medication today.  Lab Results  Component Value Date   CHOL 132 08/29/2024   HDL 44.10 08/29/2024   LDLCALC 74 08/29/2024   TRIG 71.0 08/29/2024   CHOLHDL 3 08/29/2024

## 2024-09-05 NOTE — Assessment & Plan Note (Signed)
 Breathing stable. Doing well. Continues on symbicort .

## 2024-09-05 NOTE — Assessment & Plan Note (Signed)
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  No f/u recommended.  (Dr Mia Creek)

## 2024-09-05 NOTE — Assessment & Plan Note (Signed)
Being followed by endocrinology

## 2024-09-06 ENCOUNTER — Other Ambulatory Visit: Payer: Self-pay

## 2024-09-06 ENCOUNTER — Other Ambulatory Visit: Payer: Self-pay | Admitting: Internal Medicine

## 2024-09-06 DIAGNOSIS — Z1231 Encounter for screening mammogram for malignant neoplasm of breast: Secondary | ICD-10-CM

## 2024-09-06 NOTE — Telephone Encounter (Signed)
 Order pended for review

## 2024-09-12 NOTE — Telephone Encounter (Signed)
 Please call Ms Kennebrew and thank her for the update. Also, please confirm if Mr Mcleary's legs are still swollen. Also confirm how his blood pressure is doing.

## 2024-09-12 NOTE — Telephone Encounter (Signed)
 Called pt dr Morna, Rutha started him on keflex  (cephalexin ) 09/08/2024  and he is feeling much better he has 9 pills left. His blood pressure is running a Kirsti Mcalpine bit low  today 105/60 yesterday 131/70  blood sugar today 106

## 2024-09-13 NOTE — Telephone Encounter (Signed)
 Noted

## 2024-10-20 ENCOUNTER — Other Ambulatory Visit: Payer: Self-pay | Admitting: Gastroenterology

## 2024-10-20 DIAGNOSIS — K760 Fatty (change of) liver, not elsewhere classified: Secondary | ICD-10-CM

## 2024-10-26 ENCOUNTER — Ambulatory Visit
Admission: RE | Admit: 2024-10-26 | Discharge: 2024-10-26 | Disposition: A | Discharge status: Home / Self Care | Source: Ambulatory Visit | Attending: Internal Medicine | Admitting: Internal Medicine

## 2024-10-26 DIAGNOSIS — Z1231 Encounter for screening mammogram for malignant neoplasm of breast: Secondary | ICD-10-CM | POA: Diagnosis present

## 2024-10-27 ENCOUNTER — Other Ambulatory Visit: Payer: Self-pay | Admitting: Gastroenterology

## 2024-10-27 ENCOUNTER — Ambulatory Visit: Admission: RE | Admit: 2024-10-27 | Discharge: 2024-10-27 | Attending: Gastroenterology | Admitting: Gastroenterology

## 2024-10-27 DIAGNOSIS — K760 Fatty (change of) liver, not elsewhere classified: Secondary | ICD-10-CM | POA: Insufficient documentation

## 2024-11-07 ENCOUNTER — Encounter: Payer: Self-pay | Admitting: Pharmacist

## 2024-11-07 NOTE — Progress Notes (Signed)
 Pharmacy Quality Measure Review  This patient is appearing on a report for being at risk of failing the adherence measure for hypertension (ACEi/ARB) medications this calendar year.   Medication: losartan  100 mg  Last fill date: 8/15 for 90 day supply  Insurance report was not up to date. No action needed at this time.  Medication has been refilled as of 11/5 x90 ds.

## 2025-01-02 ENCOUNTER — Other Ambulatory Visit

## 2025-01-05 ENCOUNTER — Encounter: Admitting: Internal Medicine

## 2025-06-05 ENCOUNTER — Ambulatory Visit
